# Patient Record
Sex: Female | Born: 1935
Health system: Southern US, Community
[De-identification: ages and names within clinical notes are randomized; demographics above are authoritative.]

## PROBLEM LIST (undated history)

## (undated) DIAGNOSIS — K5730 Diverticulosis of large intestine without perforation or abscess without bleeding: Secondary | ICD-10-CM

## (undated) DIAGNOSIS — K219 Gastro-esophageal reflux disease without esophagitis: Secondary | ICD-10-CM

## (undated) DIAGNOSIS — J42 Unspecified chronic bronchitis: Secondary | ICD-10-CM

## (undated) DIAGNOSIS — I4891 Unspecified atrial fibrillation: Secondary | ICD-10-CM

## (undated) DIAGNOSIS — F0390 Unspecified dementia without behavioral disturbance: Secondary | ICD-10-CM

## (undated) DIAGNOSIS — F411 Generalized anxiety disorder: Secondary | ICD-10-CM

## (undated) DIAGNOSIS — I10 Essential (primary) hypertension: Secondary | ICD-10-CM

## (undated) DIAGNOSIS — I712 Thoracic aortic aneurysm, without rupture: Secondary | ICD-10-CM

## (undated) DIAGNOSIS — M810 Age-related osteoporosis without current pathological fracture: Secondary | ICD-10-CM

## (undated) DIAGNOSIS — I2510 Atherosclerotic heart disease of native coronary artery without angina pectoris: Secondary | ICD-10-CM

## (undated) DIAGNOSIS — I442 Atrioventricular block, complete: Secondary | ICD-10-CM

## (undated) DIAGNOSIS — D649 Anemia, unspecified: Secondary | ICD-10-CM

## (undated) DIAGNOSIS — E785 Hyperlipidemia, unspecified: Secondary | ICD-10-CM

## (undated) DIAGNOSIS — Z952 Presence of prosthetic heart valve: Secondary | ICD-10-CM

## (undated) DIAGNOSIS — M1990 Unspecified osteoarthritis, unspecified site: Secondary | ICD-10-CM

## (undated) DIAGNOSIS — Z950 Presence of cardiac pacemaker: Secondary | ICD-10-CM

## (undated) DIAGNOSIS — I6529 Occlusion and stenosis of unspecified carotid artery: Secondary | ICD-10-CM

## (undated) HISTORY — PX: INTRAOCULAR LENS INSERTION: SHX110

## (undated) HISTORY — DX: Gastro-esophageal reflux disease without esophagitis: K21.9

## (undated) HISTORY — DX: Anemia, unspecified: D64.9

## (undated) HISTORY — PX: VESICOVAGINAL FISTULA CLOSURE W/ TAH: SUR271

## (undated) HISTORY — DX: Generalized anxiety disorder: F41.1

## (undated) HISTORY — DX: Diverticulosis of large intestine without perforation or abscess without bleeding: K57.30

## (undated) HISTORY — PX: CATARACT EXTRACTION: SUR2

## (undated) HISTORY — DX: Unspecified chronic bronchitis: J42

## (undated) HISTORY — DX: Unspecified atrial fibrillation: I48.91

## (undated) HISTORY — PX: CORONARY ARTERY BYPASS GRAFT: SHX141

## (undated) HISTORY — DX: Unspecified dementia without behavioral disturbance: F03.90

## (undated) HISTORY — DX: Age-related osteoporosis without current pathological fracture: M81.0

## (undated) HISTORY — DX: Unspecified osteoarthritis, unspecified site: M19.90

## (undated) HISTORY — DX: Presence of cardiac pacemaker: Z95.0

---

## 1996-03-23 HISTORY — PX: TOTAL HIP ARTHROPLASTY: SHX124

## 1998-09-04 ENCOUNTER — Emergency Department (HOSPITAL_COMMUNITY): Admission: EM | Admit: 1998-09-04 | Discharge: 1998-09-04 | Payer: Self-pay | Admitting: Emergency Medicine

## 1998-12-22 LAB — HM COLONOSCOPY

## 1998-12-31 ENCOUNTER — Ambulatory Visit (HOSPITAL_COMMUNITY): Admission: RE | Admit: 1998-12-31 | Discharge: 1998-12-31 | Payer: Self-pay | Admitting: Gastroenterology

## 1999-02-24 ENCOUNTER — Encounter: Payer: Self-pay | Admitting: Pulmonary Disease

## 1999-02-24 ENCOUNTER — Encounter: Admission: RE | Admit: 1999-02-24 | Discharge: 1999-02-24 | Payer: Self-pay | Admitting: Pulmonary Disease

## 2000-04-20 ENCOUNTER — Encounter: Payer: Self-pay | Admitting: Pulmonary Disease

## 2000-04-20 ENCOUNTER — Encounter: Admission: RE | Admit: 2000-04-20 | Discharge: 2000-04-20 | Payer: Self-pay | Admitting: Pulmonary Disease

## 2000-05-03 ENCOUNTER — Encounter: Payer: Self-pay | Admitting: Internal Medicine

## 2000-05-03 ENCOUNTER — Encounter: Admission: RE | Admit: 2000-05-03 | Discharge: 2000-05-03 | Payer: Self-pay | Admitting: Internal Medicine

## 2000-05-26 ENCOUNTER — Encounter: Admission: RE | Admit: 2000-05-26 | Discharge: 2000-07-22 | Payer: Self-pay | Admitting: Internal Medicine

## 2000-07-20 ENCOUNTER — Ambulatory Visit (HOSPITAL_COMMUNITY): Admission: RE | Admit: 2000-07-20 | Discharge: 2000-07-20 | Payer: Self-pay | Admitting: Pulmonary Disease

## 2000-07-20 ENCOUNTER — Encounter: Payer: Self-pay | Admitting: Pulmonary Disease

## 2001-03-01 ENCOUNTER — Ambulatory Visit (HOSPITAL_COMMUNITY): Admission: RE | Admit: 2001-03-01 | Discharge: 2001-03-01 | Payer: Self-pay | Admitting: Cardiology

## 2001-03-23 DIAGNOSIS — I712 Thoracic aortic aneurysm, without rupture, unspecified: Secondary | ICD-10-CM

## 2001-03-23 DIAGNOSIS — Z952 Presence of prosthetic heart valve: Secondary | ICD-10-CM

## 2001-03-23 HISTORY — PX: AORTIC VALVE REPLACEMENT: SHX41

## 2001-03-23 HISTORY — DX: Presence of prosthetic heart valve: Z95.2

## 2001-03-23 HISTORY — DX: Thoracic aortic aneurysm, without rupture: I71.2

## 2001-03-23 HISTORY — DX: Thoracic aortic aneurysm, without rupture, unspecified: I71.20

## 2001-09-12 ENCOUNTER — Ambulatory Visit (HOSPITAL_COMMUNITY): Admission: RE | Admit: 2001-09-12 | Discharge: 2001-09-12 | Payer: Self-pay | Admitting: Cardiothoracic Surgery

## 2001-09-12 ENCOUNTER — Encounter: Payer: Self-pay | Admitting: Cardiothoracic Surgery

## 2001-11-08 ENCOUNTER — Ambulatory Visit (HOSPITAL_COMMUNITY): Admission: RE | Admit: 2001-11-08 | Discharge: 2001-11-08 | Payer: Self-pay | Admitting: Cardiology

## 2001-12-02 ENCOUNTER — Encounter: Payer: Self-pay | Admitting: Cardiothoracic Surgery

## 2001-12-05 ENCOUNTER — Encounter (INDEPENDENT_AMBULATORY_CARE_PROVIDER_SITE_OTHER): Payer: Self-pay | Admitting: Specialist

## 2001-12-05 ENCOUNTER — Encounter: Payer: Self-pay | Admitting: Cardiothoracic Surgery

## 2001-12-05 ENCOUNTER — Inpatient Hospital Stay (HOSPITAL_COMMUNITY): Admission: RE | Admit: 2001-12-05 | Discharge: 2001-12-13 | Payer: Self-pay | Admitting: Cardiothoracic Surgery

## 2001-12-06 ENCOUNTER — Encounter: Payer: Self-pay | Admitting: Cardiothoracic Surgery

## 2001-12-07 ENCOUNTER — Encounter: Payer: Self-pay | Admitting: Cardiothoracic Surgery

## 2001-12-08 ENCOUNTER — Encounter: Payer: Self-pay | Admitting: Cardiothoracic Surgery

## 2001-12-09 ENCOUNTER — Encounter: Payer: Self-pay | Admitting: Cardiothoracic Surgery

## 2002-01-01 ENCOUNTER — Emergency Department (HOSPITAL_COMMUNITY): Admission: EM | Admit: 2002-01-01 | Discharge: 2002-01-01 | Payer: Self-pay | Admitting: Emergency Medicine

## 2002-01-19 ENCOUNTER — Encounter: Payer: Self-pay | Admitting: Cardiothoracic Surgery

## 2002-01-19 ENCOUNTER — Encounter: Admission: RE | Admit: 2002-01-19 | Discharge: 2002-01-19 | Payer: Self-pay | Admitting: Cardiothoracic Surgery

## 2002-01-30 ENCOUNTER — Encounter (HOSPITAL_COMMUNITY): Admission: RE | Admit: 2002-01-30 | Discharge: 2002-02-23 | Payer: Self-pay | Admitting: Cardiology

## 2002-05-24 ENCOUNTER — Ambulatory Visit (HOSPITAL_COMMUNITY): Admission: RE | Admit: 2002-05-24 | Discharge: 2002-05-24 | Payer: Self-pay | Admitting: Pulmonary Disease

## 2002-05-24 ENCOUNTER — Encounter: Payer: Self-pay | Admitting: Pulmonary Disease

## 2002-10-09 ENCOUNTER — Encounter: Payer: Self-pay | Admitting: Orthopedic Surgery

## 2002-10-16 ENCOUNTER — Inpatient Hospital Stay (HOSPITAL_COMMUNITY): Admission: RE | Admit: 2002-10-16 | Discharge: 2002-10-20 | Payer: Self-pay | Admitting: Orthopedic Surgery

## 2002-10-16 ENCOUNTER — Encounter: Payer: Self-pay | Admitting: Orthopedic Surgery

## 2002-11-25 ENCOUNTER — Encounter: Payer: Self-pay | Admitting: Emergency Medicine

## 2002-11-25 ENCOUNTER — Inpatient Hospital Stay (HOSPITAL_COMMUNITY): Admission: EM | Admit: 2002-11-25 | Discharge: 2002-12-02 | Payer: Self-pay | Admitting: Emergency Medicine

## 2002-11-28 ENCOUNTER — Encounter: Payer: Self-pay | Admitting: Cardiology

## 2003-01-24 ENCOUNTER — Inpatient Hospital Stay (HOSPITAL_COMMUNITY): Admission: EM | Admit: 2003-01-24 | Discharge: 2003-01-29 | Payer: Self-pay | Admitting: *Deleted

## 2003-01-27 ENCOUNTER — Encounter (INDEPENDENT_AMBULATORY_CARE_PROVIDER_SITE_OTHER): Payer: Self-pay | Admitting: *Deleted

## 2003-02-17 ENCOUNTER — Emergency Department (HOSPITAL_COMMUNITY): Admission: EM | Admit: 2003-02-17 | Discharge: 2003-02-17 | Payer: Self-pay | Admitting: Emergency Medicine

## 2004-03-04 ENCOUNTER — Ambulatory Visit: Payer: Self-pay | Admitting: Pulmonary Disease

## 2004-05-22 ENCOUNTER — Ambulatory Visit: Payer: Self-pay | Admitting: Pulmonary Disease

## 2004-06-02 ENCOUNTER — Ambulatory Visit: Payer: Self-pay | Admitting: Pulmonary Disease

## 2004-08-26 ENCOUNTER — Ambulatory Visit: Payer: Self-pay | Admitting: Cardiology

## 2004-09-02 ENCOUNTER — Ambulatory Visit: Payer: Self-pay | Admitting: Pulmonary Disease

## 2004-09-10 ENCOUNTER — Ambulatory Visit: Payer: Self-pay

## 2004-09-11 ENCOUNTER — Ambulatory Visit (HOSPITAL_COMMUNITY): Admission: RE | Admit: 2004-09-11 | Discharge: 2004-09-11 | Payer: Self-pay | Admitting: Cardiovascular Disease

## 2005-01-07 ENCOUNTER — Ambulatory Visit: Payer: Self-pay | Admitting: Pulmonary Disease

## 2005-04-27 ENCOUNTER — Ambulatory Visit: Payer: Self-pay | Admitting: Pulmonary Disease

## 2005-05-11 ENCOUNTER — Ambulatory Visit: Payer: Self-pay | Admitting: Pulmonary Disease

## 2005-09-01 ENCOUNTER — Ambulatory Visit: Payer: Self-pay | Admitting: Cardiology

## 2005-09-07 ENCOUNTER — Ambulatory Visit (HOSPITAL_COMMUNITY): Admission: RE | Admit: 2005-09-07 | Discharge: 2005-09-07 | Payer: Self-pay | Admitting: Cardiology

## 2005-09-14 ENCOUNTER — Ambulatory Visit: Payer: Self-pay

## 2005-09-14 ENCOUNTER — Encounter: Payer: Self-pay | Admitting: Cardiology

## 2005-09-16 ENCOUNTER — Ambulatory Visit: Payer: Self-pay | Admitting: Pulmonary Disease

## 2006-01-14 ENCOUNTER — Ambulatory Visit: Payer: Self-pay | Admitting: Pulmonary Disease

## 2006-03-22 ENCOUNTER — Inpatient Hospital Stay (HOSPITAL_COMMUNITY): Admission: EM | Admit: 2006-03-22 | Discharge: 2006-04-02 | Payer: Self-pay | Admitting: Emergency Medicine

## 2006-03-22 ENCOUNTER — Ambulatory Visit: Payer: Self-pay | Admitting: Pulmonary Disease

## 2006-03-22 ENCOUNTER — Ambulatory Visit: Payer: Self-pay | Admitting: Cardiovascular Disease

## 2006-03-24 ENCOUNTER — Encounter: Payer: Self-pay | Admitting: Cardiology

## 2006-03-29 ENCOUNTER — Encounter (INDEPENDENT_AMBULATORY_CARE_PROVIDER_SITE_OTHER): Payer: Self-pay | Admitting: Specialist

## 2006-04-05 ENCOUNTER — Ambulatory Visit: Payer: Self-pay | Admitting: Cardiology

## 2006-04-06 ENCOUNTER — Ambulatory Visit: Payer: Self-pay | Admitting: Pulmonary Disease

## 2006-04-06 LAB — CONVERTED CEMR LAB
ALT: 20 units/L (ref 0–40)
AST: 18 units/L (ref 0–37)
Albumin: 2.9 g/dL — ABNORMAL LOW (ref 3.5–5.2)
BUN: 18 mg/dL (ref 6–23)
Basophils Absolute: 0.1 10*3/uL (ref 0.0–0.1)
CO2: 28 meq/L (ref 19–32)
Creatinine, Ser: 1.5 mg/dL — ABNORMAL HIGH (ref 0.4–1.2)
GFR calc Af Amer: 43 mL/min
INR: 2.3 — ABNORMAL HIGH (ref 0.9–2.0)
MCHC: 32.7 g/dL (ref 30.0–36.0)
MCV: 90.2 fL (ref 78.0–100.0)
Neutro Abs: 5.3 10*3/uL (ref 1.4–7.7)
Neutrophils Relative %: 68.7 % (ref 43.0–77.0)
Prothrombin Time: 19.2 s — ABNORMAL HIGH (ref 10.0–14.0)
RBC: 3.95 M/uL (ref 3.87–5.11)
Sed Rate: 117 mm/hr — ABNORMAL HIGH (ref 0–25)
Total Bilirubin: 1 mg/dL (ref 0.3–1.2)
WBC: 7.8 10*3/uL (ref 4.5–10.5)

## 2006-04-12 ENCOUNTER — Ambulatory Visit: Payer: Self-pay | Admitting: Cardiology

## 2006-04-14 ENCOUNTER — Ambulatory Visit (HOSPITAL_COMMUNITY): Admission: RE | Admit: 2006-04-14 | Discharge: 2006-04-14 | Payer: Self-pay | Admitting: Pulmonary Disease

## 2006-04-20 ENCOUNTER — Ambulatory Visit: Payer: Self-pay | Admitting: Cardiology

## 2006-04-21 ENCOUNTER — Ambulatory Visit: Payer: Self-pay | Admitting: Cardiology

## 2006-04-30 ENCOUNTER — Ambulatory Visit: Payer: Self-pay | Admitting: *Deleted

## 2006-05-04 ENCOUNTER — Ambulatory Visit: Payer: Self-pay | Admitting: Pulmonary Disease

## 2006-05-04 LAB — CONVERTED CEMR LAB
CO2: 29 meq/L (ref 19–32)
Creatinine, Ser: 1.3 mg/dL — ABNORMAL HIGH (ref 0.4–1.2)
Eosinophils Absolute: 0.2 10*3/uL (ref 0.0–0.6)
Eosinophils Relative: 3.7 % (ref 0.0–5.0)
GFR calc Af Amer: 51 mL/min
GFR calc non Af Amer: 42 mL/min
HCT: 36.1 % (ref 36.0–46.0)
Hemoglobin: 12.4 g/dL (ref 12.0–15.0)
INR: 2.2 — ABNORMAL HIGH (ref 0.9–2.0)
MCHC: 34.4 g/dL (ref 30.0–36.0)
Monocytes Absolute: 0.5 10*3/uL (ref 0.2–0.7)
Neutro Abs: 3.3 10*3/uL (ref 1.4–7.7)
Prothrombin Time: 18.9 s — ABNORMAL HIGH (ref 10.0–14.0)
Sodium: 140 meq/L (ref 135–145)

## 2006-05-12 ENCOUNTER — Ambulatory Visit: Payer: Self-pay | Admitting: Cardiovascular Disease

## 2006-06-02 ENCOUNTER — Ambulatory Visit: Payer: Self-pay | Admitting: Cardiology

## 2006-06-07 ENCOUNTER — Ambulatory Visit: Payer: Self-pay | Admitting: Pulmonary Disease

## 2006-06-07 LAB — CONVERTED CEMR LAB
BUN: 27 mg/dL — ABNORMAL HIGH (ref 6–23)
Basophils Absolute: 0 10*3/uL (ref 0.0–0.1)
Basophils Relative: 0.4 % (ref 0.0–1.0)
Bilirubin, Direct: 0.1 mg/dL (ref 0.0–0.3)
Calcium: 9.1 mg/dL (ref 8.4–10.5)
Chloride: 100 meq/L (ref 96–112)
Creatinine, Ser: 1.1 mg/dL (ref 0.4–1.2)
Eosinophils Absolute: 0.1 10*3/uL (ref 0.0–0.6)
GFR calc Af Amer: 61 mL/min
HCT: 40.1 % (ref 36.0–46.0)
Hemoglobin: 13.6 g/dL (ref 12.0–15.0)
Lymphocytes Relative: 13.6 % (ref 12.0–46.0)
MCHC: 34 g/dL (ref 30.0–36.0)
Platelets: 267 10*3/uL (ref 150–400)
RBC: 4.73 M/uL (ref 3.87–5.11)
Sed Rate: 46 mm/hr — ABNORMAL HIGH (ref 0–25)
Total Bilirubin: 0.7 mg/dL (ref 0.3–1.2)
Total Protein: 6.9 g/dL (ref 6.0–8.3)
WBC: 7.9 10*3/uL (ref 4.5–10.5)

## 2006-06-16 ENCOUNTER — Ambulatory Visit: Payer: Self-pay | Admitting: Cardiovascular Disease

## 2006-07-07 ENCOUNTER — Ambulatory Visit: Payer: Self-pay | Admitting: Cardiology

## 2006-07-10 ENCOUNTER — Emergency Department (HOSPITAL_COMMUNITY): Admission: EM | Admit: 2006-07-10 | Discharge: 2006-07-10 | Payer: Self-pay | Admitting: Emergency Medicine

## 2006-07-14 ENCOUNTER — Ambulatory Visit: Payer: Self-pay | Admitting: *Deleted

## 2006-07-15 ENCOUNTER — Ambulatory Visit: Payer: Self-pay | Admitting: Pulmonary Disease

## 2006-07-28 ENCOUNTER — Ambulatory Visit: Payer: Self-pay | Admitting: *Deleted

## 2006-08-18 ENCOUNTER — Ambulatory Visit: Payer: Self-pay | Admitting: Cardiology

## 2006-08-23 ENCOUNTER — Ambulatory Visit: Payer: Self-pay | Admitting: Pulmonary Disease

## 2006-08-23 LAB — CONVERTED CEMR LAB
AST: 21 units/L (ref 0–37)
Alkaline Phosphatase: 101 units/L (ref 39–117)
Basophils Absolute: 0 10*3/uL (ref 0.0–0.1)
Basophils Relative: 0.1 % (ref 0.0–1.0)
Bilirubin, Direct: 0.1 mg/dL (ref 0.0–0.3)
CO2: 30 meq/L (ref 19–32)
Calcium: 8.9 mg/dL (ref 8.4–10.5)
Chloride: 105 meq/L (ref 96–112)
Creatinine, Ser: 1.2 mg/dL (ref 0.4–1.2)
Eosinophils Relative: 0.3 % (ref 0.0–5.0)
GFR calc non Af Amer: 46 mL/min
HCT: 39.1 % (ref 36.0–46.0)
Hemoglobin: 13.5 g/dL (ref 12.0–15.0)
Monocytes Absolute: 0.7 10*3/uL (ref 0.2–0.7)
Monocytes Relative: 8.2 % (ref 3.0–11.0)
Neutro Abs: 7 10*3/uL (ref 1.4–7.7)
Potassium: 4 meq/L (ref 3.5–5.1)
RBC: 4.62 M/uL (ref 3.87–5.11)
Total Protein: 6.7 g/dL (ref 6.0–8.3)
WBC: 8.8 10*3/uL (ref 4.5–10.5)

## 2006-08-26 ENCOUNTER — Ambulatory Visit: Payer: Self-pay | Admitting: Cardiovascular Disease

## 2006-08-30 ENCOUNTER — Ambulatory Visit: Payer: Self-pay | Admitting: Cardiology

## 2006-08-30 ENCOUNTER — Observation Stay (HOSPITAL_COMMUNITY): Admission: EM | Admit: 2006-08-30 | Discharge: 2006-08-31 | Payer: Self-pay | Admitting: Emergency Medicine

## 2006-09-02 ENCOUNTER — Ambulatory Visit: Payer: Self-pay | Admitting: Cardiology

## 2006-09-02 ENCOUNTER — Ambulatory Visit: Payer: Self-pay

## 2006-09-02 LAB — CONVERTED CEMR LAB
CO2: 26 meq/L (ref 19–32)
Creatinine, Ser: 1.2 mg/dL (ref 0.4–1.2)
Glucose, Bld: 101 mg/dL — ABNORMAL HIGH (ref 70–99)
Potassium: 4.4 meq/L (ref 3.5–5.1)

## 2006-09-06 ENCOUNTER — Emergency Department (HOSPITAL_COMMUNITY): Admission: EM | Admit: 2006-09-06 | Discharge: 2006-09-06 | Payer: Self-pay | Admitting: Emergency Medicine

## 2006-09-07 ENCOUNTER — Observation Stay (HOSPITAL_COMMUNITY): Admission: EM | Admit: 2006-09-07 | Discharge: 2006-09-08 | Payer: Self-pay | Admitting: Emergency Medicine

## 2006-09-07 ENCOUNTER — Ambulatory Visit: Payer: Self-pay | Admitting: Cardiovascular Disease

## 2006-09-13 ENCOUNTER — Ambulatory Visit: Payer: Self-pay | Admitting: Pulmonary Disease

## 2006-09-14 ENCOUNTER — Emergency Department (HOSPITAL_COMMUNITY): Admission: EM | Admit: 2006-09-14 | Discharge: 2006-09-14 | Payer: Self-pay | Admitting: Emergency Medicine

## 2006-09-15 ENCOUNTER — Ambulatory Visit: Payer: Self-pay | Admitting: Internal Medicine

## 2006-09-20 ENCOUNTER — Ambulatory Visit: Payer: Self-pay | Admitting: Pulmonary Disease

## 2006-09-20 LAB — CONVERTED CEMR LAB
BUN: 12 mg/dL (ref 6–23)
Calcium: 9.3 mg/dL (ref 8.4–10.5)
Chloride: 103 meq/L (ref 96–112)
Glucose, Bld: 97 mg/dL (ref 70–99)

## 2006-09-22 ENCOUNTER — Ambulatory Visit: Payer: Self-pay | Admitting: Cardiology

## 2006-09-29 ENCOUNTER — Ambulatory Visit: Payer: Self-pay | Admitting: Cardiology

## 2006-10-06 ENCOUNTER — Ambulatory Visit: Payer: Self-pay | Admitting: Internal Medicine

## 2006-10-12 ENCOUNTER — Ambulatory Visit: Payer: Self-pay | Admitting: Pulmonary Disease

## 2006-10-27 ENCOUNTER — Ambulatory Visit: Payer: Self-pay | Admitting: Cardiology

## 2006-11-03 ENCOUNTER — Ambulatory Visit: Payer: Self-pay | Admitting: Cardiology

## 2006-11-24 ENCOUNTER — Ambulatory Visit: Payer: Self-pay | Admitting: Internal Medicine

## 2006-12-21 ENCOUNTER — Ambulatory Visit: Payer: Self-pay | Admitting: Cardiology

## 2007-01-18 DIAGNOSIS — I10 Essential (primary) hypertension: Secondary | ICD-10-CM | POA: Insufficient documentation

## 2007-01-18 DIAGNOSIS — F411 Generalized anxiety disorder: Secondary | ICD-10-CM

## 2007-01-19 ENCOUNTER — Ambulatory Visit: Payer: Self-pay | Admitting: Cardiology

## 2007-01-19 LAB — CONVERTED CEMR LAB
CO2: 30 meq/L (ref 19–32)
Calcium: 9.3 mg/dL (ref 8.4–10.5)
Chloride: 99 meq/L (ref 96–112)
Creatinine, Ser: 1.3 mg/dL — ABNORMAL HIGH (ref 0.4–1.2)
GFR calc Af Amer: 51 mL/min
Sodium: 136 meq/L (ref 135–145)

## 2007-01-20 ENCOUNTER — Ambulatory Visit: Payer: Self-pay

## 2007-01-24 ENCOUNTER — Ambulatory Visit: Payer: Self-pay | Admitting: Cardiology

## 2007-01-24 LAB — CONVERTED CEMR LAB
BUN: 22 mg/dL (ref 6–23)
Basophils Absolute: 0 10*3/uL (ref 0.0–0.1)
CO2: 30 meq/L (ref 19–32)
Calcium: 9.2 mg/dL (ref 8.4–10.5)
Chloride: 106 meq/L (ref 96–112)
Eosinophils Absolute: 0.1 10*3/uL (ref 0.0–0.6)
Eosinophils Relative: 1.7 % (ref 0.0–5.0)
GFR calc Af Amer: 55 mL/min
HCT: 40.9 % (ref 36.0–46.0)
INR: 2.2 — ABNORMAL HIGH (ref 0.8–1.0)
Lymphocytes Relative: 20.9 % (ref 12.0–46.0)
MCV: 82.6 fL (ref 78.0–100.0)
Monocytes Absolute: 0.4 10*3/uL (ref 0.2–0.7)
Neutro Abs: 3.2 10*3/uL (ref 1.4–7.7)
Platelets: 218 10*3/uL (ref 150–400)
RBC: 4.96 M/uL (ref 3.87–5.11)
Sodium: 142 meq/L (ref 135–145)
aPTT: 36.9 s — ABNORMAL HIGH (ref 21.7–29.8)

## 2007-02-14 ENCOUNTER — Ambulatory Visit: Payer: Self-pay | Admitting: Cardiology

## 2007-02-16 ENCOUNTER — Ambulatory Visit: Payer: Self-pay | Admitting: Cardiology

## 2007-02-25 ENCOUNTER — Observation Stay (HOSPITAL_COMMUNITY): Admission: EM | Admit: 2007-02-25 | Discharge: 2007-02-25 | Payer: Self-pay | Admitting: Emergency Medicine

## 2007-02-25 ENCOUNTER — Ambulatory Visit: Payer: Self-pay | Admitting: Internal Medicine

## 2007-03-11 ENCOUNTER — Telehealth: Payer: Self-pay | Admitting: Pulmonary Disease

## 2007-03-18 ENCOUNTER — Ambulatory Visit: Payer: Self-pay | Admitting: Pulmonary Disease

## 2007-03-18 ENCOUNTER — Inpatient Hospital Stay (HOSPITAL_COMMUNITY): Admission: EM | Admit: 2007-03-18 | Discharge: 2007-03-30 | Payer: Self-pay | Admitting: Emergency Medicine

## 2007-03-31 ENCOUNTER — Telehealth: Payer: Self-pay | Admitting: Pulmonary Disease

## 2007-04-13 ENCOUNTER — Ambulatory Visit: Payer: Self-pay | Admitting: Pulmonary Disease

## 2007-04-13 DIAGNOSIS — D649 Anemia, unspecified: Secondary | ICD-10-CM | POA: Insufficient documentation

## 2007-04-13 DIAGNOSIS — K5730 Diverticulosis of large intestine without perforation or abscess without bleeding: Secondary | ICD-10-CM | POA: Insufficient documentation

## 2007-04-13 DIAGNOSIS — F0390 Unspecified dementia without behavioral disturbance: Secondary | ICD-10-CM | POA: Insufficient documentation

## 2007-04-13 DIAGNOSIS — I2510 Atherosclerotic heart disease of native coronary artery without angina pectoris: Secondary | ICD-10-CM

## 2007-04-13 DIAGNOSIS — E785 Hyperlipidemia, unspecified: Secondary | ICD-10-CM

## 2007-04-13 DIAGNOSIS — I38 Endocarditis, valve unspecified: Secondary | ICD-10-CM | POA: Insufficient documentation

## 2007-04-13 DIAGNOSIS — I480 Paroxysmal atrial fibrillation: Secondary | ICD-10-CM

## 2007-04-14 ENCOUNTER — Ambulatory Visit: Payer: Self-pay | Admitting: Pulmonary Disease

## 2007-04-22 ENCOUNTER — Telehealth (INDEPENDENT_AMBULATORY_CARE_PROVIDER_SITE_OTHER): Payer: Self-pay | Admitting: *Deleted

## 2007-06-01 ENCOUNTER — Ambulatory Visit: Payer: Self-pay | Admitting: Pulmonary Disease

## 2007-06-01 DIAGNOSIS — N259 Disorder resulting from impaired renal tubular function, unspecified: Secondary | ICD-10-CM | POA: Insufficient documentation

## 2007-06-01 DIAGNOSIS — M1990 Unspecified osteoarthritis, unspecified site: Secondary | ICD-10-CM | POA: Insufficient documentation

## 2007-06-06 ENCOUNTER — Telehealth: Payer: Self-pay | Admitting: Pulmonary Disease

## 2007-06-06 LAB — CONVERTED CEMR LAB
Basophils Absolute: 0 10*3/uL (ref 0.0–0.1)
Basophils Relative: 0.8 % (ref 0.0–1.0)
CO2: 33 meq/L — ABNORMAL HIGH (ref 19–32)
Chloride: 101 meq/L (ref 96–112)
Eosinophils Absolute: 0.2 10*3/uL (ref 0.0–0.6)
HCT: 35.1 % — ABNORMAL LOW (ref 36.0–46.0)
Lymphocytes Relative: 18 % (ref 12.0–46.0)
MCHC: 33.2 g/dL (ref 30.0–36.0)
MCV: 87.4 fL (ref 78.0–100.0)
Monocytes Absolute: 0.8 10*3/uL — ABNORMAL HIGH (ref 0.2–0.7)
Neutrophils Relative %: 65.3 % (ref 43.0–77.0)
Potassium: 4.1 meq/L (ref 3.5–5.1)
RBC: 4.02 M/uL (ref 3.87–5.11)
Sodium: 140 meq/L (ref 135–145)

## 2007-06-09 ENCOUNTER — Ambulatory Visit: Payer: Self-pay | Admitting: Cardiology

## 2007-06-17 ENCOUNTER — Encounter: Payer: Self-pay | Admitting: Pulmonary Disease

## 2007-06-20 ENCOUNTER — Ambulatory Visit: Payer: Self-pay

## 2007-06-20 ENCOUNTER — Encounter: Payer: Self-pay | Admitting: Cardiology

## 2007-06-20 ENCOUNTER — Encounter: Payer: Self-pay | Admitting: Pulmonary Disease

## 2007-06-22 ENCOUNTER — Telehealth: Payer: Self-pay | Admitting: Pulmonary Disease

## 2007-06-23 ENCOUNTER — Encounter: Payer: Self-pay | Admitting: Pulmonary Disease

## 2007-06-28 ENCOUNTER — Encounter: Payer: Self-pay | Admitting: Pulmonary Disease

## 2007-06-29 ENCOUNTER — Telehealth: Payer: Self-pay | Admitting: Pulmonary Disease

## 2007-07-05 ENCOUNTER — Ambulatory Visit: Payer: Self-pay | Admitting: Pulmonary Disease

## 2007-07-18 ENCOUNTER — Telehealth: Payer: Self-pay | Admitting: Pulmonary Disease

## 2007-07-19 ENCOUNTER — Encounter: Payer: Self-pay | Admitting: Pulmonary Disease

## 2007-07-20 ENCOUNTER — Telehealth (INDEPENDENT_AMBULATORY_CARE_PROVIDER_SITE_OTHER): Payer: Self-pay | Admitting: *Deleted

## 2007-08-11 ENCOUNTER — Ambulatory Visit: Payer: Self-pay | Admitting: Pulmonary Disease

## 2007-08-11 ENCOUNTER — Telehealth (INDEPENDENT_AMBULATORY_CARE_PROVIDER_SITE_OTHER): Payer: Self-pay | Admitting: *Deleted

## 2007-08-11 LAB — CONVERTED CEMR LAB
Bilirubin Urine: NEGATIVE
Ketones, ur: NEGATIVE mg/dL
Leukocytes, UA: NEGATIVE
Specific Gravity, Urine: 1.02 (ref 1.000–1.03)
Urobilinogen, UA: 0.2 (ref 0.0–1.0)

## 2007-09-05 ENCOUNTER — Ambulatory Visit: Payer: Self-pay | Admitting: Pulmonary Disease

## 2007-09-10 LAB — CONVERTED CEMR LAB
ALT: 10 units/L (ref 0–35)
AST: 13 units/L (ref 0–37)
Alkaline Phosphatase: 80 units/L (ref 39–117)
Basophils Absolute: 0 10*3/uL (ref 0.0–0.1)
Basophils Relative: 0 % (ref 0.0–1.0)
Bilirubin, Direct: 0.1 mg/dL (ref 0.0–0.3)
Chloride: 99 meq/L (ref 96–112)
Creatinine, Ser: 1.2 mg/dL (ref 0.4–1.2)
Eosinophils Absolute: 0.1 10*3/uL (ref 0.0–0.7)
Eosinophils Relative: 2.1 % (ref 0.0–5.0)
GFR calc non Af Amer: 46 mL/min
Glucose, Bld: 89 mg/dL (ref 70–99)
Hemoglobin: 11.9 g/dL — ABNORMAL LOW (ref 12.0–15.0)
Hgb A1c MFr Bld: 5.8 % (ref 4.6–6.0)
MCV: 86.5 fL (ref 78.0–100.0)
Neutro Abs: 3.9 10*3/uL (ref 1.4–7.7)
Platelets: 269 10*3/uL (ref 150–400)
Potassium: 3.7 meq/L (ref 3.5–5.1)
RBC: 4.02 M/uL (ref 3.87–5.11)
RDW: 15.7 % — ABNORMAL HIGH (ref 11.5–14.6)
TSH: 1.71 microintl units/mL (ref 0.35–5.50)

## 2007-11-08 ENCOUNTER — Encounter: Payer: Self-pay | Admitting: Pulmonary Disease

## 2007-11-23 ENCOUNTER — Ambulatory Visit: Payer: Self-pay | Admitting: Cardiology

## 2007-12-06 ENCOUNTER — Ambulatory Visit: Payer: Self-pay | Admitting: Pulmonary Disease

## 2007-12-11 LAB — CONVERTED CEMR LAB
Basophils Absolute: 0.1 10*3/uL (ref 0.0–0.1)
Basophils Relative: 0.9 % (ref 0.0–3.0)
CO2: 29 meq/L (ref 19–32)
Chloride: 108 meq/L (ref 96–112)
Eosinophils Absolute: 0.2 10*3/uL (ref 0.0–0.7)
Eosinophils Relative: 2.7 % (ref 0.0–5.0)
GFR calc Af Amer: 61 mL/min
Hgb A1c MFr Bld: 5.5 % (ref 4.6–6.0)
MCHC: 34.1 g/dL (ref 30.0–36.0)
Monocytes Absolute: 0.6 10*3/uL (ref 0.1–1.0)
Monocytes Relative: 9.8 % (ref 3.0–12.0)
Neutro Abs: 3.5 10*3/uL (ref 1.4–7.7)
Neutrophils Relative %: 64.4 % (ref 43.0–77.0)
Platelets: 221 10*3/uL (ref 150–400)
Potassium: 4.5 meq/L (ref 3.5–5.1)
Pro B Natriuretic peptide (BNP): 353 pg/mL — ABNORMAL HIGH (ref 0.0–100.0)
RDW: 15.2 % — ABNORMAL HIGH (ref 11.5–14.6)

## 2008-02-14 ENCOUNTER — Encounter: Payer: Self-pay | Admitting: Pulmonary Disease

## 2008-04-05 ENCOUNTER — Ambulatory Visit: Payer: Self-pay | Admitting: Pulmonary Disease

## 2008-04-07 LAB — CONVERTED CEMR LAB
ALT: 11 units/L (ref 0–35)
Alkaline Phosphatase: 98 units/L (ref 39–117)
Calcium: 9.2 mg/dL (ref 8.4–10.5)
Chloride: 104 meq/L (ref 96–112)
Cholesterol: 247 mg/dL (ref 0–200)
Direct LDL: 165.6 mg/dL
Eosinophils Absolute: 0.1 10*3/uL (ref 0.0–0.7)
GFR calc Af Amer: 43 mL/min
Hemoglobin: 12.8 g/dL (ref 12.0–15.0)
Monocytes Absolute: 0.5 10*3/uL (ref 0.1–1.0)
Monocytes Relative: 9 % (ref 3.0–12.0)
Neutro Abs: 3.9 10*3/uL (ref 1.4–7.7)
RBC: 4.36 M/uL (ref 3.87–5.11)
Sodium: 140 meq/L (ref 135–145)
Total Bilirubin: 0.8 mg/dL (ref 0.3–1.2)
Total CHOL/HDL Ratio: 6.2
Total Protein: 6.8 g/dL (ref 6.0–8.3)
Triglycerides: 200 mg/dL — ABNORMAL HIGH (ref 0–149)
Vit D, 1,25-Dihydroxy: 7 — ABNORMAL LOW (ref 30–89)

## 2008-04-09 DIAGNOSIS — K219 Gastro-esophageal reflux disease without esophagitis: Secondary | ICD-10-CM

## 2008-04-10 ENCOUNTER — Telehealth (INDEPENDENT_AMBULATORY_CARE_PROVIDER_SITE_OTHER): Payer: Self-pay | Admitting: *Deleted

## 2008-05-08 DIAGNOSIS — Z954 Presence of other heart-valve replacement: Secondary | ICD-10-CM

## 2008-05-08 DIAGNOSIS — M810 Age-related osteoporosis without current pathological fracture: Secondary | ICD-10-CM

## 2008-06-22 ENCOUNTER — Inpatient Hospital Stay (HOSPITAL_COMMUNITY): Admission: EM | Admit: 2008-06-22 | Discharge: 2008-06-29 | Payer: Self-pay | Admitting: *Deleted

## 2008-06-22 ENCOUNTER — Encounter: Payer: Self-pay | Admitting: Pulmonary Disease

## 2008-06-22 ENCOUNTER — Ambulatory Visit: Payer: Self-pay | Admitting: Surgery

## 2008-06-22 ENCOUNTER — Ambulatory Visit: Payer: Self-pay | Admitting: Pulmonary Disease

## 2008-06-28 ENCOUNTER — Ambulatory Visit: Payer: Self-pay | Admitting: Physical Medicine & Rehabilitation

## 2008-06-29 ENCOUNTER — Inpatient Hospital Stay (HOSPITAL_COMMUNITY)
Admission: RE | Admit: 2008-06-29 | Discharge: 2008-07-09 | Payer: Self-pay | Admitting: Physical Medicine & Rehabilitation

## 2008-06-29 ENCOUNTER — Ambulatory Visit: Payer: Self-pay | Admitting: Physical Medicine & Rehabilitation

## 2008-07-09 ENCOUNTER — Encounter: Payer: Self-pay | Admitting: Pulmonary Disease

## 2008-08-01 ENCOUNTER — Encounter: Payer: Self-pay | Admitting: Pulmonary Disease

## 2008-09-03 ENCOUNTER — Telehealth (INDEPENDENT_AMBULATORY_CARE_PROVIDER_SITE_OTHER): Payer: Self-pay | Admitting: *Deleted

## 2008-09-07 ENCOUNTER — Encounter: Payer: Self-pay | Admitting: Pulmonary Disease

## 2008-09-19 ENCOUNTER — Ambulatory Visit: Payer: Self-pay | Admitting: Pulmonary Disease

## 2008-09-19 DIAGNOSIS — M545 Low back pain: Secondary | ICD-10-CM | POA: Insufficient documentation

## 2008-09-20 ENCOUNTER — Ambulatory Visit: Payer: Self-pay | Admitting: Pulmonary Disease

## 2008-09-29 LAB — CONVERTED CEMR LAB
ALT: 15 units/L (ref 0–35)
AST: 21 units/L (ref 0–37)
Alkaline Phosphatase: 111 units/L (ref 39–117)
BUN: 21 mg/dL (ref 6–23)
Basophils Relative: 0.3 % (ref 0.0–3.0)
Bilirubin, Direct: 0.1 mg/dL (ref 0.0–0.3)
CO2: 30 meq/L (ref 19–32)
Chloride: 100 meq/L (ref 96–112)
Cholesterol: 157 mg/dL (ref 0–200)
Creatinine, Ser: 1.4 mg/dL — ABNORMAL HIGH (ref 0.4–1.2)
Direct LDL: 84.5 mg/dL
Eosinophils Relative: 3.3 % (ref 0.0–5.0)
Glucose, Bld: 75 mg/dL (ref 70–99)
HCT: 38.7 % (ref 36.0–46.0)
HDL: 41.9 mg/dL (ref >39.00)
Hemoglobin: 13 g/dL (ref 12.0–15.0)
MCV: 87.8 fL (ref 78.0–100.0)
Monocytes Relative: 7.1 % (ref 3.0–12.0)
Neutrophils Relative %: 71 % (ref 43.0–77.0)
Platelets: 240 10*3/uL (ref 150.0–400.0)
Potassium: 3.9 meq/L (ref 3.5–5.1)
RBC: 4.41 M/uL (ref 3.87–5.11)
Sed Rate: 49 mm/hr — ABNORMAL HIGH (ref 0–22)
Sodium: 140 meq/L (ref 135–145)
Total Bilirubin: 0.7 mg/dL (ref 0.3–1.2)
Total CHOL/HDL Ratio: 4
WBC: 6 10*3/uL (ref 4.5–10.5)

## 2008-10-02 ENCOUNTER — Telehealth (INDEPENDENT_AMBULATORY_CARE_PROVIDER_SITE_OTHER): Payer: Self-pay | Admitting: *Deleted

## 2008-10-12 ENCOUNTER — Telehealth: Payer: Self-pay | Admitting: Pulmonary Disease

## 2008-10-12 DIAGNOSIS — H9190 Unspecified hearing loss, unspecified ear: Secondary | ICD-10-CM | POA: Insufficient documentation

## 2008-10-24 ENCOUNTER — Encounter: Payer: Self-pay | Admitting: Pulmonary Disease

## 2008-11-29 ENCOUNTER — Ambulatory Visit: Payer: Self-pay | Admitting: Cardiology

## 2008-11-29 DIAGNOSIS — I6529 Occlusion and stenosis of unspecified carotid artery: Secondary | ICD-10-CM

## 2008-11-29 IMAGING — CR DG CHEST 1V
1 series · 1 of 1 positions shown · non-contrast
Comparison: 03/28/06.

CLINICAL DATA: Status-post left thoracentesis, pneumonia.  Question pneumothorax. 
 CHEST ? 1 VIEW:

[view not recorded]
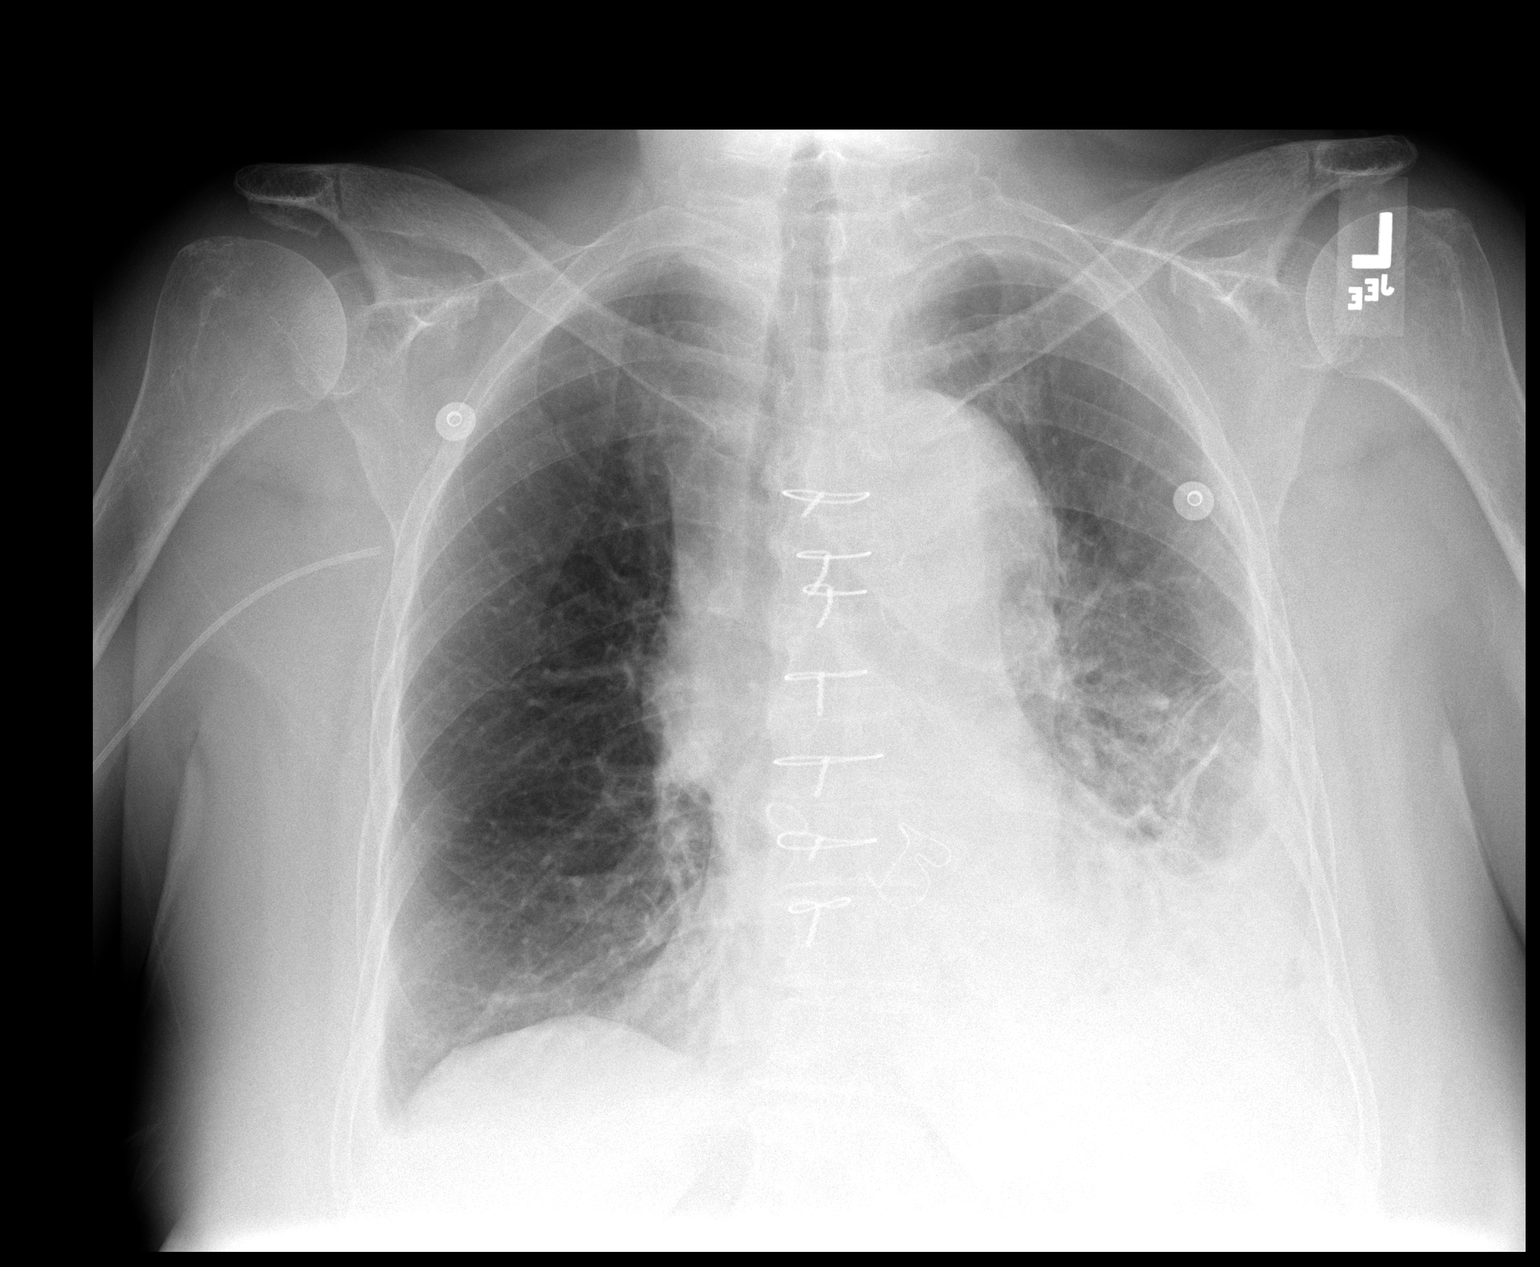

[1 of 1 positions shown; findings below may reference images not displayed]

FINDINGS: There has been a left thoracentesis.  Small left pleural effusion remains. There are left basilar atelectatic/infiltrative changes and there is a small right pleural effusion.  There is no evidence for pneumothorax. There is mild cardiomegaly.  There has been a previous median sternotomy and a previous aortic valve replacement.  The aorta is diffusely ectatic but unchanged in appearance.
IMPRESSION: No pneumothorax following left thoracentesis.  Small bilateral pleural effusions.  Left basilar atelectasis/infiltrate.

## 2008-12-05 ENCOUNTER — Telehealth (INDEPENDENT_AMBULATORY_CARE_PROVIDER_SITE_OTHER): Payer: Self-pay | Admitting: *Deleted

## 2008-12-12 ENCOUNTER — Encounter: Payer: Self-pay | Admitting: Pulmonary Disease

## 2008-12-17 ENCOUNTER — Ambulatory Visit: Payer: Self-pay

## 2008-12-17 ENCOUNTER — Encounter: Payer: Self-pay | Admitting: Cardiology

## 2009-01-02 ENCOUNTER — Ambulatory Visit: Payer: Self-pay | Admitting: Pulmonary Disease

## 2009-01-23 ENCOUNTER — Ambulatory Visit: Payer: Self-pay | Admitting: Pulmonary Disease

## 2009-01-29 ENCOUNTER — Telehealth: Payer: Self-pay | Admitting: Pulmonary Disease

## 2009-03-12 IMAGING — CR DG CHEST 2V
2 series · 2 of 2 positions shown · non-contrast
Comparison: 04/14/2006. 

Exam: Chest, 2 views.

HISTORY: Facial numbness and dizziness.

Nonsmoker. Shortness of breath and chest pain.

[w chest pa]
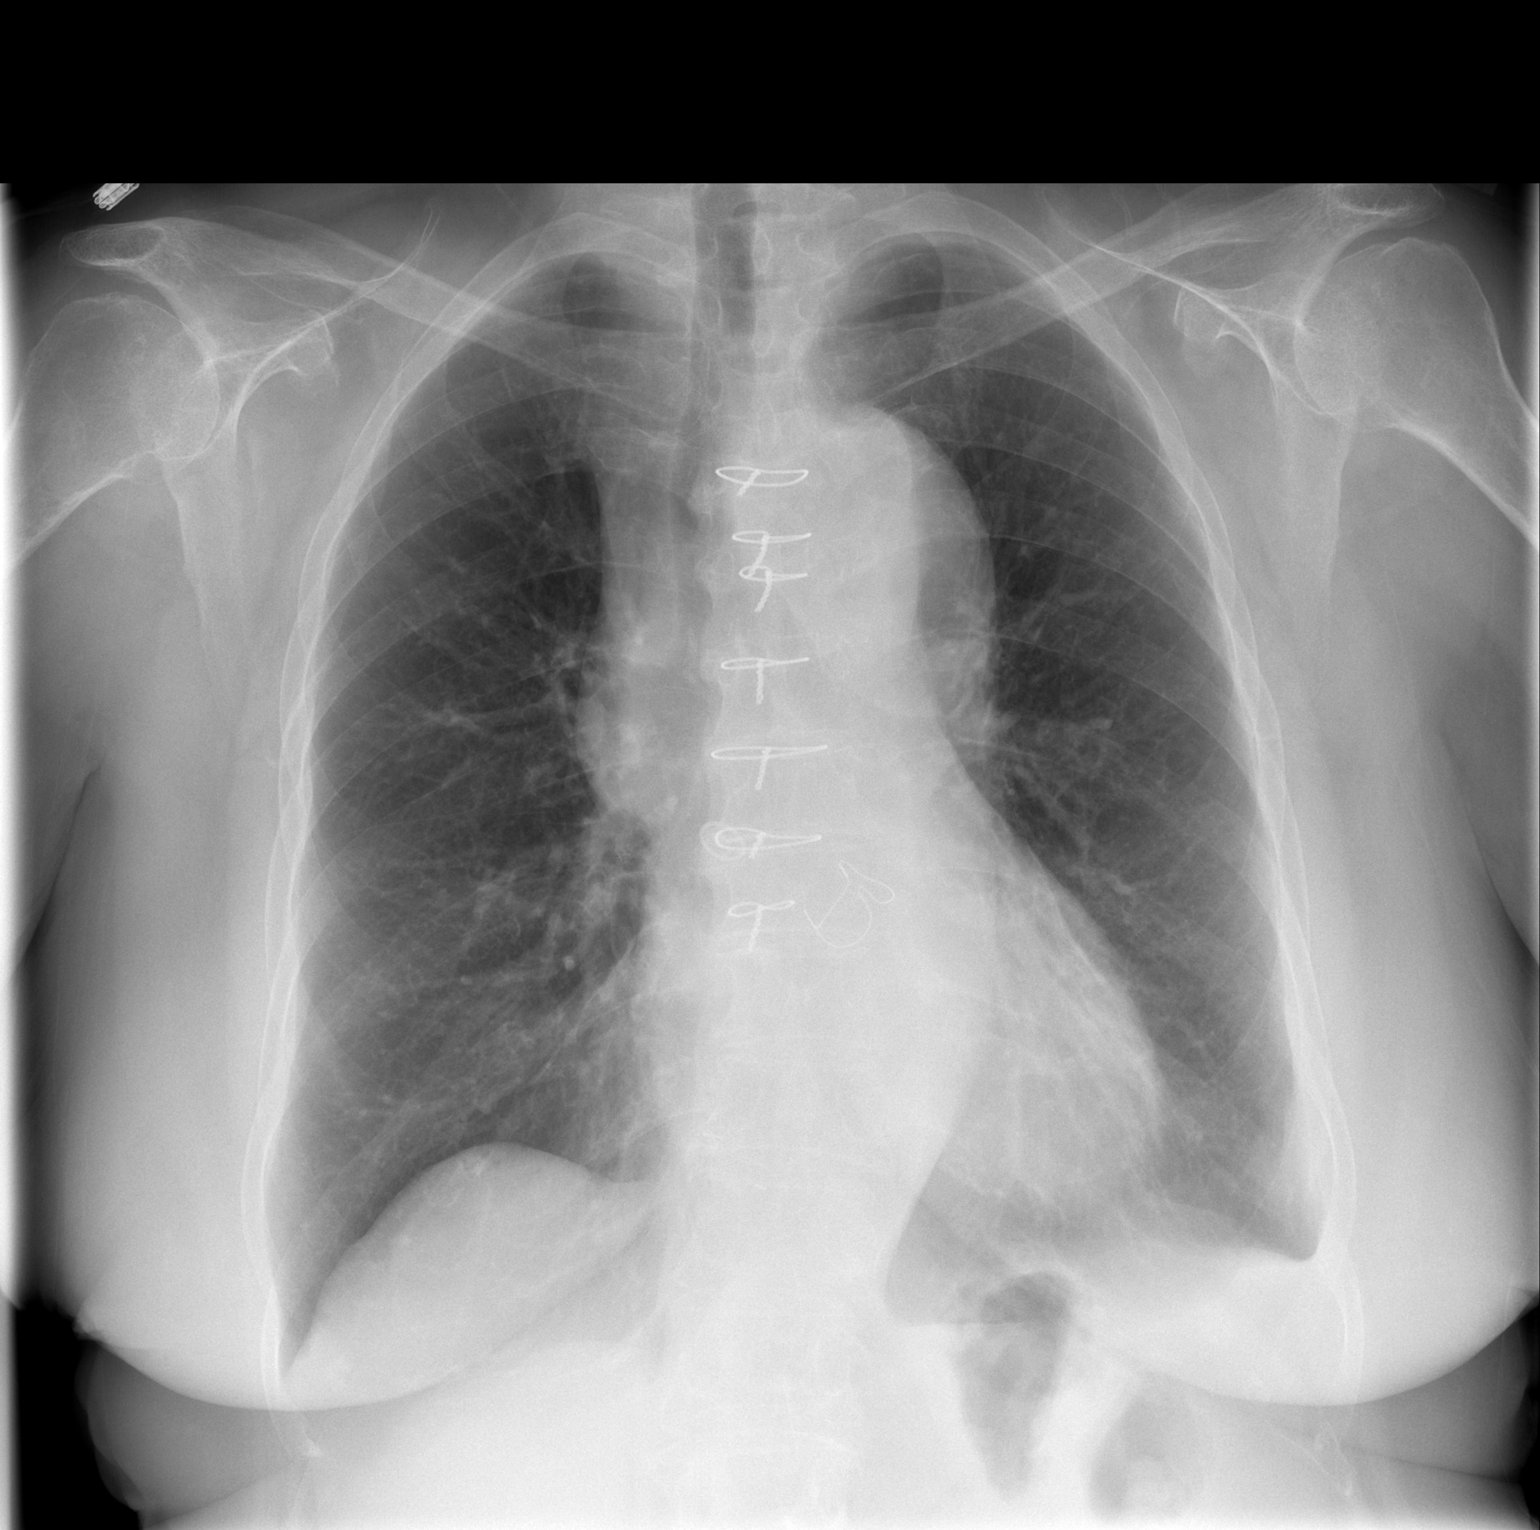

[w chest lat]
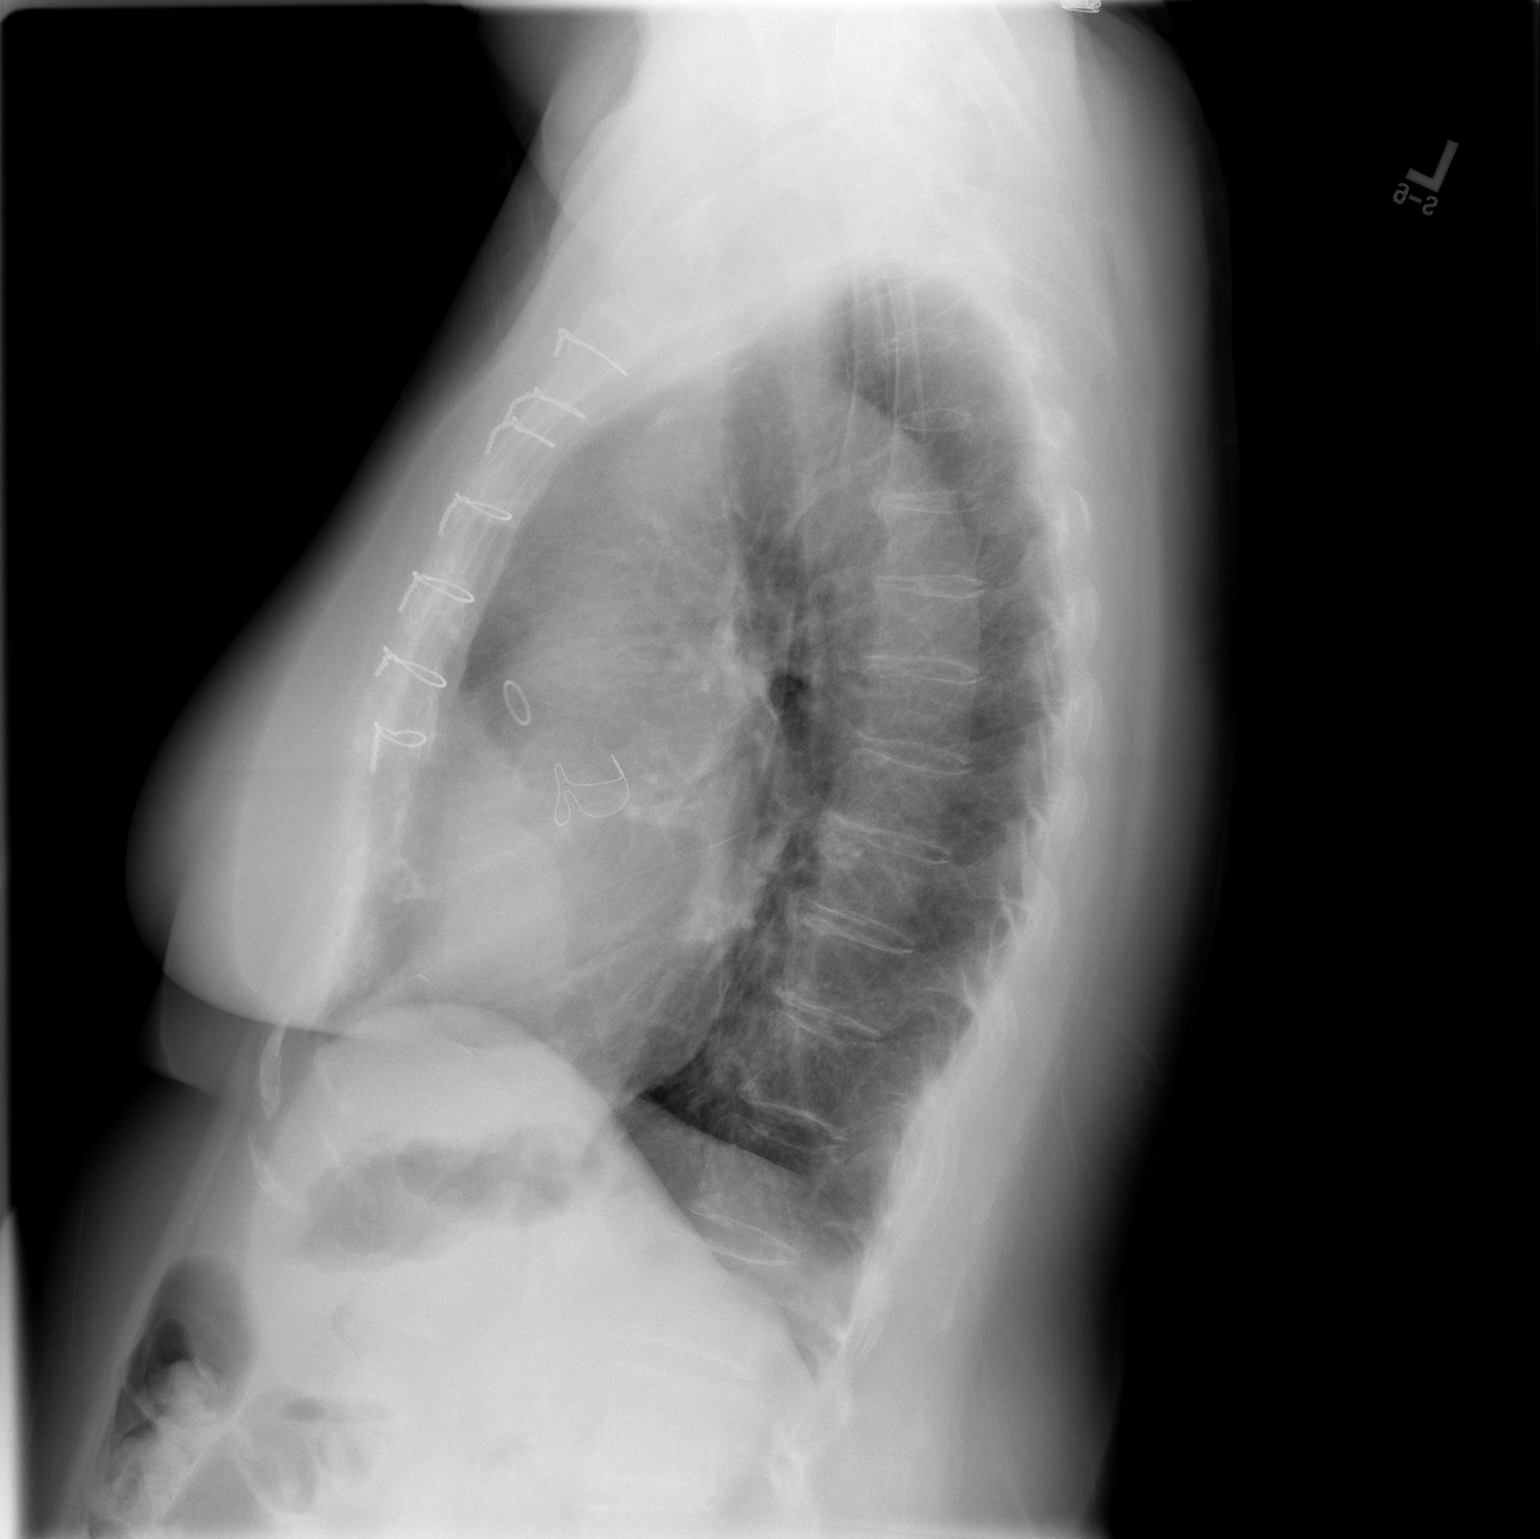

[2 of 2 positions shown; findings below may reference images not displayed]

FINDINGS: Heart size is normal.

There is a left pleural effusion, small.

No interstitial edema.

No airspace opacities.
IMPRESSION: 1. Small left effusion.
2. No edema or no air space disease.

## 2009-04-01 ENCOUNTER — Telehealth: Payer: Self-pay | Admitting: Pulmonary Disease

## 2009-05-21 ENCOUNTER — Telehealth (INDEPENDENT_AMBULATORY_CARE_PROVIDER_SITE_OTHER): Payer: Self-pay | Admitting: *Deleted

## 2009-05-22 ENCOUNTER — Ambulatory Visit: Payer: Self-pay | Admitting: Pulmonary Disease

## 2009-05-29 ENCOUNTER — Ambulatory Visit: Payer: Self-pay | Admitting: Pulmonary Disease

## 2009-06-02 LAB — CONVERTED CEMR LAB
Albumin: 3.6 g/dL (ref 3.5–5.2)
Basophils Relative: 0.8 % (ref 0.0–3.0)
Bilirubin, Direct: 0.1 mg/dL (ref 0.0–0.3)
CO2: 30 meq/L (ref 19–32)
Calcium: 8.8 mg/dL (ref 8.4–10.5)
Cholesterol: 258 mg/dL — ABNORMAL HIGH (ref 0–200)
Creatinine, Ser: 1.2 mg/dL (ref 0.4–1.2)
GFR calc non Af Amer: 45.54 mL/min (ref >60)
HDL: 48.5 mg/dL (ref >39.00)
Hemoglobin: 12.8 g/dL (ref 12.0–15.0)
Lymphocytes Relative: 17 % (ref 12.0–46.0)
MCHC: 33.1 g/dL (ref 30.0–36.0)
Monocytes Relative: 8.3 % (ref 3.0–12.0)
Neutro Abs: 3.2 10*3/uL (ref 1.4–7.7)
RBC: 4.36 M/uL (ref 3.87–5.11)
Total CHOL/HDL Ratio: 5
Total Protein: 6.7 g/dL (ref 6.0–8.3)
Triglycerides: 359 mg/dL — ABNORMAL HIGH (ref 0.0–149.0)
Vit D, 25-Hydroxy: 26 ng/mL — ABNORMAL LOW (ref 30–89)

## 2009-06-06 ENCOUNTER — Telehealth (INDEPENDENT_AMBULATORY_CARE_PROVIDER_SITE_OTHER): Payer: Self-pay | Admitting: *Deleted

## 2009-06-10 ENCOUNTER — Ambulatory Visit: Payer: Self-pay | Admitting: Pulmonary Disease

## 2009-06-12 ENCOUNTER — Telehealth (INDEPENDENT_AMBULATORY_CARE_PROVIDER_SITE_OTHER): Payer: Self-pay | Admitting: *Deleted

## 2009-06-18 ENCOUNTER — Ambulatory Visit: Payer: Self-pay | Admitting: Pulmonary Disease

## 2009-11-26 ENCOUNTER — Ambulatory Visit: Payer: Self-pay | Admitting: Pulmonary Disease

## 2009-11-27 ENCOUNTER — Ambulatory Visit: Payer: Self-pay | Admitting: Pulmonary Disease

## 2009-12-01 LAB — CONVERTED CEMR LAB
ALT: 14 units/L (ref 0–35)
CO2: 29 meq/L (ref 19–32)
Chloride: 103 meq/L (ref 96–112)
Direct LDL: 102.7 mg/dL
HDL: 41.1 mg/dL (ref >39.00)
Sodium: 142 meq/L (ref 135–145)
Total Bilirubin: 0.5 mg/dL (ref 0.3–1.2)
Total CHOL/HDL Ratio: 5
Triglycerides: 364 mg/dL — ABNORMAL HIGH (ref 0.0–149.0)

## 2009-12-02 ENCOUNTER — Ambulatory Visit: Payer: Self-pay | Admitting: Cardiology

## 2009-12-17 ENCOUNTER — Ambulatory Visit: Payer: Self-pay

## 2009-12-17 ENCOUNTER — Encounter: Payer: Self-pay | Admitting: Cardiology

## 2009-12-17 ENCOUNTER — Ambulatory Visit (HOSPITAL_COMMUNITY): Admission: RE | Admit: 2009-12-17 | Discharge: 2009-12-17 | Payer: Self-pay | Admitting: Cardiology

## 2009-12-17 ENCOUNTER — Ambulatory Visit: Payer: Self-pay | Admitting: Cardiology

## 2009-12-19 ENCOUNTER — Telehealth (INDEPENDENT_AMBULATORY_CARE_PROVIDER_SITE_OTHER): Payer: Self-pay | Admitting: *Deleted

## 2010-04-24 NOTE — Progress Notes (Signed)
Summary: PRESCRIPT  Phone Note Call from Patient   Caller: Patient Call For: Darianna Amy Summary of Call: NEED SOMETHING FOR COLD WOULD LIKE Z PAK PHARMACY [facility] Initial call taken by: Rickard Patience,  April 01, 2009 10:00 AM  Follow-up for Phone Call        pt c/o chest congestion, dry cough, and increased SOB x 3 dyas. Pt has tried Robutussin without relief. Pt denie fever. Please advise. Carron Curie CMA  April 01, 2009 10:11 AM   Additional Follow-up for Phone Call Additional follow up Details #1::        per SN--ok for pt to have zpak.  #1  take as directed.  thanks Randell Loop CMA  April 01, 2009 10:20 AM   rx sent. pt aware. Carron Curie CMA  April 01, 2009 10:31 AM     New/Updated Medications: ZITHROMAX Z-PAK 250 MG TABS (AZITHROMYCIN) as directed Prescriptions: ZITHROMAX Z-PAK 250 MG TABS (AZITHROMYCIN) as directed  #1 pak x 0   Entered by:   Carron Curie CMA   Authorized by:   Michele Mcalpine MD   Signed by:   Carron Curie CMA on 04/01/2009   Method used:   [facility] to        [facility] [address] [phone]* (retail)       [address]       [place], [place]  [address]       Ph: [phone] or [phone]       Fax: [phone]   RxID:   [id number]

## 2010-04-24 NOTE — Assessment & Plan Note (Signed)
Summary: Acute NP follow up - bronchitis   CC:   F/U congestionx X 6 days and Patient is concerned she3 may have Pneumonia.  History of Present Illness: 77 y/o WF    ~  Sep09:  she states that she is doing OK and this is confirmed by her husband who cares for her at home... she complains of excess urination and blames the Lasix and wants to decrease the dose (on 80mg Qam)... we checked her renal function (BUN= 17, Creat= 1.1) & BNP (353) & decided to keep everything the same... she notes that DrPlovsky has been adjusting her meds and she wants to get off them...    ~  Jan10:  states she's feeling great and more like herself... DrPlovsky has decreased her meds, stable & doing OK (confirmed by her husb)... weight stable 183# "I'm on a diet now"... we will f/u CXR and fasting labs today... OK H1N1 vaccine today.  ~  Apr10:  Hosp after fall at home w/ L1 compression fx & retropulsed fragment in canal- eval by DrElsner w/ rec for conservative management w/ brace, rest, rehab... tx to inpt rehab and DC'd home 07/09/08 followed by DrElsner, DrNorris, DrPlovsky... she reports improved w/ therapy & husb's good care- using Tramadol as needed pain, brace, walker, etc... DrPlovsky sees her every 3-4 mo & recently stopped her Depakote- now on Celexa alone.   ~  January 23, 2009:  here by herself today "so I could show you I could do it on my own"...she had f/u DrCrenshaw 9/10- BP sl incr & asked to monitor it at home, 2DEcho done & OK w/o progressive changes, but she stopped her Crestor5 ?why- asked to restart this med... had f/u DrElsner 8/10 for L1 compression- doing satis & "he released me"... had her flu shot last month.   ~  May 29, 2009:  once again she stopped her Crestor 5mg - FLP w/ [id number] & we discussed this- she wants PAVASTATIN 40mg /d OK... she states that "my mind is back where it should be" & "I don't have dementia"... states she's driving now- & I cautioned her and her husb about this.    June 10, 2009 --Pt presents for an acute office visit. She complains of chest congestionx for 6 days,. She called in last week, sent a  zpack-- on last day. Not better, still has thick mucus, wheezing and harsh cough. OTC not helping. Denies chest pain, dyspnea, orthopnea, hemoptysis, fever, n/v/d, edema, headache,   Medications Prior to Update: 1)  Ecotrin 325 Mg  Tbec (Aspirin) .... Take 1 Tablet By Mouth Once A Day 2)  Lopressor 50 Mg  Tabs (Metoprolol Tartrate) .... Take One Tablet By Mouth Two Times A Day 3)  Norvasc 5 Mg Tabs (Amlodipine Besylate) .... Take 1 Tablet By Mouth Once A Day 4)  Furosemide 40 Mg  Tabs (Furosemide) .... Take 2 Tabs By Mouth Once Daily in Am... 5)  Omeprazole 20 Mg  Cpdr (Omeprazole) .... Take 1 Tab By Mouth As Needed... 6)  Senna-Plus 8.6-50 Mg Tabs (Sennosides-Docusate Sodium) .... Take 2 Tablets By Mouth Daily 7)  Miralax  Powd (Polyethylene Glycol 3350) .... As Needed 8)  Tramadol Hcl 50 Mg Tabs (Tramadol Hcl) .... Take 1 Tab By Mouth Up To Three Times A Day As Needed For Pain... 9)  Ra Calcium 500 Mg Tabs (Calcium) .... Take 3 Tablets By Mouth Once Daily 10)  Vitamin D 1000 Unit Tabs (Cholecalciferol) .... Take 2 Tablets By Mouth Once A  Day 11)  Folic Acid 1 Mg  Tabs (Folic Acid) .... Take 1 Tablet By Mouth Once A Day 12)  Celexa 20 Mg  Tabs (Citalopram Hydrobromide) .... Give 1 and 1/2 Tab By Mouth At Bedtime As Directed By Drplovsky... 13)  Pravastatin Sodium 40 Mg Tabs (Pravastatin Sodium) .... Take 1 Tab By Mouth At Bedtime... 14)  Zithromax Z-Pak 250 Mg Tabs (Azithromycin) .... Take As Directed  Current Medications (verified): 1)  Ecotrin 325 Mg  Tbec (Aspirin) .... Take 1 Tablet By Mouth Once A Day 2)  Lopressor 50 Mg  Tabs (Metoprolol Tartrate) .... Take One Tablet By Mouth Two Times A Day 3)  Norvasc 5 Mg Tabs (Amlodipine Besylate) .... Take 1 Tablet By Mouth Once A Day 4)  Furosemide 40 Mg  Tabs (Furosemide) .... Take 2 Tabs By Mouth Once Daily in  Am... 5)  Omeprazole 20 Mg  Cpdr (Omeprazole) .... Take 1 Tab By Mouth As Needed... 6)  Senna-Plus 8.6-50 Mg Tabs (Sennosides-Docusate Sodium) .... Take 2 Tablets By Mouth Daily 7)  Miralax  Powd (Polyethylene Glycol 3350) .... As Needed 8)  Tramadol Hcl 50 Mg Tabs (Tramadol Hcl) .... Take 1 Tab By Mouth Up To Three Times A Day As Needed For Pain... 9)  Ra Calcium 500 Mg Tabs (Calcium) .... Take 3 Tablets By Mouth Once Daily 10)  Vitamin D 1000 Unit Tabs (Cholecalciferol) .... Take 2 Tablets By Mouth Once A Day 11)  Folic Acid 1 Mg  Tabs (Folic Acid) .... Take 1 Tablet By Mouth Once A Day 12)  Celexa 20 Mg  Tabs (Citalopram Hydrobromide) .... Give 1 and 1/2 Tab By Mouth At Bedtime As Directed By Drplovsky... 13)  Pravastatin Sodium 40 Mg Tabs (Pravastatin Sodium) .... Take 1 Tab By Mouth At Bedtime... 14)  Zithromax Z-Pak 250 Mg Tabs (Azithromycin) .... Take As Directed  Allergies: 1)  ! Pcn 2)  ! Sulfa 3)  ! Biaxin 4)  ! * Ivp Dye 5)  ! Prednisone 6)  ! * Statins 7)  ! * Cephtin  Past History:  Past Medical History: Last updated: 05/29/2009  HEARING LOSS (ICD-389.9) BRONCHITIS, RECURRENT (ICD-491.9) Hx of PNEUMONIA (ICD-486) HYPERTENSION (ICD-401.9) CORONARY ARTERY DISEASE (ICD-414.00) Hx of VALVULAR HEART DISEASE (ICD-424.90) Hx of ATRIAL FIBRILLATION (ICD-427.31) CEREBROVASCULAR ACCIDENT, HX OF (ICD-V12.50) HYPERLIPIDEMIA (ICD-272.4) GERD (ICD-530.81) DIVERTICULOSIS OF COLON (ICD-562.10) HEMORRHOIDS (ICD-455.6) RENAL INSUFFICIENCY (ICD-588.9) Hx of UTI (ICD-599.0) DEGENERATIVE JOINT DISEASE (ICD-715.90) BACK PAIN, LUMBAR (ICD-724.2) OSTEOPOROSIS (ICD-733.00) DEMENTIA (ICD-294.8) ANXIETY (ICD-300.00) ANEMIA (ICD-285.9)  Past Surgical History: Last updated: 05/29/2009  HIP REPLACEMENT, LEFT, HX OF (ICD-V43.64) S/P HYSTERECTOMY INTRAOCULAR LENS IMPLANT, BILATERAL, HX OF (ICD-V43.1) CATARACT EXTRACTIONS, BILATERAL, HX OF (ICD-V45.61) CORONARY ARTERY BYPASS GRAFT,  HX OF (ICD-V45.81) AORTIC VALVE REPLACEMENT, HX OF (ICD-V43.3)  Family History: Last updated: 2008/09/24 mother deceased father deceased pt was very young when parents died and is unsure of their age or reasons. 1 brother deceased age 45  hx of DM 1 sister alive age 24  hx of DM 1 brother deceaed age 31   hx of DM  Social History: Last updated: 09/24/2008 Retired  Married  Tobacco Use - Never Alcohol Use - no caffeine use---coffee daily  Risk Factors: Smoking Status: never (05/08/2008)  Review of Systems      See HPI  Vital Signs:  Patient profile:   77 year old female Weight:      181.8 pounds O2 Sat:      96 % on Room air Temp:  97 degrees F oral Pulse rate:   65 / minute BP sitting:   136 / 72  (left arm) Cuff size:   regular  Vitals Entered By: Boone Master CNA (June 10, 2009 4:09 PM)  O2 Sat at Rest %:  96 O2 Flow:  Room air CC:  F/U congestionx X 6 days, Patient is concerned she3 may have Pneumonia Is Patient Diabetic? No Pain Assessment Patient in pain? no      Comments Medications reviewed with patient Daytime contact number verified with patient. Boone Master CNA  June 10, 2009 4:31 PM    Physical Exam  Additional Exam:  WD, sl overweight, 77 y/o WF in NAD... flattened affect apparent, without change... GENERAL:  Alert & oriented x3... HEENT:  [place]/AT, EOM-wnl, PERRLA, EACs-clear, TMs-wnl, NOSE-clear, THROAT- sl dry, clear & wnl. NECK:  Supple w/ fairROM; no JVD; normal carotid impulses w/o bruits; no thyromegaly or nodules palpated; no lymphadenopathy. CHEST:  Coarse BS w/ faint exp wheezing .  HEART: s/p med sternotomy, regular,  gr1/6 SEM without rubs or gallops apprec... ABDOMEN:  Soft & nontender; normal bowel sounds; no organomegaly or masses detected. EXT: without deformities, s/p left THR, mod arthritic changes; no varicose veins/ +venous insuffic/ tr edema is noted...     Impression & Recommendations:  Problem # 1:  BRONCHITIS,  RECURRENT (ICD-491.9)  Flare REC: Xopenex neb given in office.  Avelox 400mg  once daily for 7 days Mucinex DM two times a day as needed cough/congestion  Medrol dose pack  Please contact office for sooner follow up if symptoms do not improve or worsen  follow up Dr. Kriste Basque as scheduled.   Orders: Nebulizer Tx (29562) Est. Patient Level IV (13086)  Medications Added to Medication List This Visit: 1)  Avelox 400 Mg Tabs (Moxifloxacin hcl) .Marland Kitchen.. 1 by mouth once daily 2)  Medrol (pak) 4 Mg Tabs (Methylprednisolone) .... Take as directed.  Complete Medication List: 1)  Ecotrin 325 Mg Tbec (Aspirin) .... Take 1 tablet by mouth once a day 2)  Lopressor 50 Mg Tabs (Metoprolol tartrate) .... Take one tablet by mouth two times a day 3)  Norvasc 5 Mg Tabs (Amlodipine besylate) .... Take 1 tablet by mouth once a day 4)  Furosemide 40 Mg Tabs (Furosemide) .... Take 2 tabs by mouth once daily in am... 5)  Omeprazole 20 Mg Cpdr (Omeprazole) .... Take 1 tab by mouth as needed... 6)  Senna-plus 8.6-50 Mg Tabs (Sennosides-docusate sodium) .... Take 2 tablets by mouth daily 7)  Miralax Powd (Polyethylene glycol 3350) .... As needed 8)  Tramadol Hcl 50 Mg Tabs (Tramadol hcl) .... Take 1 tab by mouth up to three times a day as needed for pain.Marland KitchenMarland Kitchen 9)  Ra Calcium 500 Mg Tabs (Calcium) .... Take 3 tablets by mouth once daily 10)  Vitamin D 1000 Unit Tabs (Cholecalciferol) .... Take 2 tablets by mouth once a day 11)  Folic Acid 1 Mg Tabs (Folic acid) .... Take 1 tablet by mouth once a day 12)  Celexa 20 Mg Tabs (Citalopram hydrobromide) .... Give 1 and 1/2 tab by mouth at bedtime as directed by drplovsky... 13)  Pravastatin Sodium 40 Mg Tabs (Pravastatin sodium) .... Take 1 tab by mouth at bedtime... 14)  Zithromax Z-pak 250 Mg Tabs (Azithromycin) .... Take as directed 15)  Avelox 400 Mg Tabs (Moxifloxacin hcl) .Marland Kitchen.. 1 by mouth once daily 16)  Medrol (pak) 4 Mg Tabs (Methylprednisolone) .... Take as  directed.  Patient Instructions: 1)  Avelox 400mg   once daily for 7 days 2)  Mucinex DM two times a day as needed cough/congestion  3)  Medrol dose pack  4)  Please contact office for sooner follow up if symptoms do not improve or worsen  5)  follow up Dr. Kriste Basque as scheduled.  Prescriptions: MEDROL (PAK) 4 MG TABS (METHYLPREDNISOLONE) take as directed.  #1 x 0   Entered and Authorized by:   Rubye Oaks NP   Signed by:   Zarra Geffert NP on 06/10/2009   Method used:   [facility]        [facility] [address] [phone]* (retail)       [address]       [place], [place]  [address]       Ph: [phone] or [phone]       Fax: [phone]   RxID:   [id number] AVELOX 400 MG TABS (MOXIFLOXACIN HCL) 1 by mouth once daily  #7 x 0   Entered and Authorized by:   Rubye Oaks NP   Signed by:   Casmere Hollenbeck NP on 06/10/2009   Method used:   [facility]ronically to        [facility] [address] [phone]* (retail)       [address]       [place], [place]  [address]       Ph: [phone] or [phone]       Fax: [phone]   RxID:   [id number]

## 2010-04-24 NOTE — Assessment & Plan Note (Signed)
Summary: 1 WEEK FOLLOW UP/LA   CC:  Add-on appt for f/u of bronchitic exacerbation....  History of Present Illness: 77 y/o WF w/ mult med problems here for a follow up visit...    ~  Apr10:  Hosp after fall at home w/ L1 compression fx & retropulsed fragment in canal- eval by DrElsner w/ rec for conservative management w/ brace, rest, rehab... tx to inpt rehab and DC'd home 07/09/08 followed by DrElsner, DrNorris, DrPlovsky... she reports improved w/ therapy & husb's good care- using Tramadol as needed pain, brace, walker, etc... DrPlovsky sees her every 3-4 mo & recently stopped her Depakote- now on Celexa alone.   ~  January 23, 2009:  here by herself today "so I could show you I could do it on my own"...she had f/u DrCrenshaw 9/10- BP sl incr & asked to monitor it at home, 2DEcho done & OK w/o progressive changes, but she stopped her Crestor5 ?why- asked to restart this med... had f/u DrElsner 8/10 for L1 compression- doing satis & "he released me"... had her flu shot last month.   ~  May 29, 2009:  once again she stopped her Crestor 5mg - FLP w/ TChol 258 & we discussed this- she wants PAVASTATIN 40mg /d >OK... she states that "my mind is back where it should be" & "I don't have dementia"... states she's driving now- & I cautioned her and her husb about this.   ~  June 18, 2009:  Add-on for follow up of recent AB exac> developed URI/ bronchitic exac & had ZPak called in but no better & came to see TP 3/21 w/ chest congestion, cough, thick mucus, wheezing documented... changed to Avelox, given Dosepak, Mucinex, Fluids... improved but stopped the dosepak "it swells me up" & ?rectal bleeding... now almost back to baseline, requests cough syrup...   Problem List:  HEARING LOSS - ENT eval 9/10 by DrTeoh...  BRONCHITIS, RECURRENT (ICD-491.9) - ** see above ** Hx of PNEUMONIA (ICD-486)  HYPERTENSION (ICD-401.9) - on ASA 325mg /d, LOPRESSOR 50mg Bid, NORVASC 5mg /d, & LASIX 40mg - 2tabsQam...  BP=140/84 & doing well... denies HA, visual changes, CP, palipit, dizziness, syncope, change in dyspnea, or incr edema...  CORONARY ARTERY DISEASE (ICD-414.00) - S/P CABG 2003 w/ vein graft to PDA... no CP, palpit, or change in dyspnea... she is still pretty sedentary... followed by Aletta Edouard for Cards...  ~   last NuclearStressTest 6/08 showed RBBB, no ischemia, EF=76%...  ~  labs 9/09 showed BNP= 353 on Lasix 80mg /d.  ~  labs 1/10 showed BNP= 204  ~  saw DrCrenshaw 9/10 w/ 2DEcho- mild LVH, EF=60%, wall motion norm, tissue AoV prosthesis- OK.  Hx of VALVULAR HEART DISEASE (ICD-424.90) - S/P tissue AVR & Thor AA repair 2003.  ~  she knows about the need for SBE prophylaxis.  ~  2DEcho 3/09 showed a bioprosthetic valve w/ 21mm gradient, & norm LVF w/ EF= 55-60%.  ~  2DEcho 9/10- mild LVH, EF=60%, wall motion norm, tissue AoV prosthesis- working well...  Hx of ATRIAL FIBRILLATION (ICD-427.31) - PAF prev on coumadin & stopped 2/09... she is holding NSR...  CEREBROVASCULAR ACCIDENT, HX OF (ICD-V12.50) - Eval and Rx DrSethi...  ~  CDoppler 10/08 w/ tort ICA's, no plaque, normal carotids.  ~  CDoppler 4/10 at [facility] by [facility] showed bilat 40-59% ICA stenoses believed to be from tortuosity of vessels.  HYPERLIPIDEMIA (ICD-272.4) - sher stopped the Crestor 5mg  again... she will agree to try Prav40.  ~  last FLP 1/08 showed  TChol 128, TG 94, HDL 40, LDL 69...  ~  FLP 1/10 on diet showed TChol 247, TG 200, HDL 40, LDL 166... rec> start Crestor5.  ~  FLP 7/10 on Cres5 showed TChol 157, TG 202, HDL 42, LDL 85... rec> continue same.  ~  11/10: pt reports that she stopped the Crestor ?why- ?mild symptoms, advised to restart Rx.  ~  FLP 3/11 on diet alone showed TChol 258, TG 359, HDL 49, LDL 140... start PRAV40.  GERD (ICD-530.81) - on OMEPRAZOLE 20mg /d Prn & controls her symptoms.  DIVERTICULOSIS OF COLON (ICD-562.10) & HEMORRHOIDS (ICD-455.6) - uses Miralax Prn...  ~  last colon 10/00  [facility] w/ divertics, hems, cecal AVM.  RENAL INSUFFICIENCY (ICD-588.9) & Hx of UTI (ICD-599.0)  ~  labs 3/09 showed BUN= 20, Creat= 1.4  ~  labs 9/09 showed BUN= 17, Creat= 1.1  ~  labs 7/10 showed BUN= 21, Creat= 1.4  ~  labs 3/11 showed BUN= 17, Creat= 1.2  DEGENERATIVE JOINT DISEASE (ICD-715.90) - s/p left THR 7/04 by DrAlusio.  ~  labs 7/10 showed Vit D level = 30... rec to start Vit D 1000 u daily OTC.  ~  labs 3/11 showed Vit D level = 26... rec incr Vit D to 2000 u daily.  BACK PAIN, LUMBAR (ICD-724.2) - Hosp 4/10 after fall at home w/ L1 compression fx & retropulsed fragment in canal- eval by DrElsner w/ rec for conservative management w/ brace, rest, rehab...  DEMENTIA (ICD-294.8) - Prev hx DELIRIUM w/ Abn MRI showing atrophy, sm vessel dis, old infarct... then hosp 12/08 w/ aud hallucinations & psych rx from DrWilliford w/ Risperdal, Depakote, Ativan... 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 apparently no better off coumadin, but they left her off it at disch to the nursing home)... she is now stable on a complex psychotopic regimen per [facility], Psychiatry... see med list...  ~  1/10:  now off Risperdal... on Depakote 250mg /d, Celexa 20mg - 1.5tabsQhs.  ~  6/10:  now off Depakote, on Celexa alone & followed by DrPlovsky.  ANXIETY (ICD-300.00)  ANEMIA (ICD-285.9)  ~  labs 3/09 showed Hg= 11.7  ~  labs 9/09 showed Hg 12.2  ~  labs 1/10 showed Hg= 12.8  ~  labs 7/10 showed Hg= 13.0  ~  labs 3/11 showed Hg= 12.8   Allergies: 1)  ! Pcn 2)  ! Sulfa 3)  ! Biaxin 4)  ! * Ivp Dye 5)  ! Prednisone 6)  ! * Statins 7)  ! * Cephtin  Comments:  Nurse/Medical Assistant: The patient's medications and allergies were reviewed with the patient and were updated in the Medication and Allergy  Lists.  Past History:  Past Medical History:  HEARING LOSS (ICD-389.9) BRONCHITIS, RECURRENT (ICD-491.9) Hx of PNEUMONIA (ICD-486) HYPERTENSION (ICD-401.9) CORONARY ARTERY DISEASE (ICD-414.00) Hx of VALVULAR HEART DISEASE (ICD-424.90) Hx of ATRIAL FIBRILLATION (ICD-427.31) CEREBROVASCULAR ACCIDENT, HX OF (ICD-V12.50) HYPERLIPIDEMIA (ICD-272.4) GERD (ICD-530.81) DIVERTICULOSIS OF COLON (ICD-562.10) HEMORRHOIDS (ICD-455.6) RENAL INSUFFICIENCY (ICD-588.9) Hx of UTI (ICD-599.0) DEGENERATIVE JOINT DISEASE (ICD-715.90) BACK PAIN, LUMBAR (ICD-724.2) OSTEOPOROSIS (ICD-733.00) DEMENTIA (ICD-294.8) ANXIETY (ICD-300.00) ANEMIA (ICD-285.9)  Past Surgical History:  HIP REPLACEMENT, LEFT, HX OF (ICD-V43.64) S/P HYSTERECTOMY INTRAOCULAR LENS IMPLANT, BILATERAL, HX OF (ICD-V43.1) CATARACT EXTRACTIONS, BILATERAL, HX OF (ICD-V45.61) CORONARY ARTERY BYPASS GRAFT, HX OF (ICD-V45.81) AORTIC VALVE REPLACEMENT, HX OF (ICD-V43.3)  Family History: Reviewed history from 09/19/2008 and no changes required. mother deceased father deceased pt was very young when parents died and is unsure of their age or reasons. 1 brother deceased age 45  hx of DM 1 sister alive age 21  hx of DM 1 brother deceaed age 79   hx of DM  Social History: Reviewed history from 09/19/2008 and no changes required. Retired  Married  Tobacco Use - Never Alcohol Use - no caffeine use---coffee daily  Review of Systems      See HPI       The patient complains of dyspnea on exertion.  The patient denies anorexia, fever, weight loss, weight gain, vision loss, decreased hearing, hoarseness, chest pain, syncope, peripheral edema, prolonged cough, headaches, hemoptysis, abdominal pain, melena, hematochezia, severe indigestion/heartburn, hematuria, incontinence, muscle weakness, suspicious skin lesions, transient blindness, difficulty walking, depression, unusual weight change, abnormal bleeding, enlarged lymph nodes, and  angioedema.    Vital Signs:  Patient profile:   77 year old female Height:      63 inches Weight:      179.25 pounds O2 Sat:      100 % on Room air Temp:     96.9 degrees F oral Pulse rate:   57 / minute BP sitting:   140 / 84  (left arm) Cuff size:   regular  Vitals Entered By: Randell Loop CMA (June 18, 2009 12:00 PM)  O2 Sat at Rest %:  100 O2 Flow:  Room air CC: Add-on appt for f/u of bronchitic exacerbation... Is Patient Diabetic? No Pain Assessment Patient in pain? no      Comments meds updated today   Physical Exam  Additional Exam:  WD, sl overweight, 77 y/o WF in NAD... flattened affect apparent, without change... GENERAL:  Alert & oriented x3... HEENT:  [place]/AT, EOM-wnl, PERRLA, EACs-clear, TMs-wnl, NOSE-clear, THROAT- sl dry, clear & wnl. NECK:  Supple w/ fairROM; no JVD; normal carotid impulses w/o bruits; no thyromegaly or nodules palpated; no lymphadenopathy. CHEST:  Clear to P & A; without wheezes/ rales/ or rhonchi heard... mild congested cough... HEART: s/p med sternotomy, regular,  gr1/6 SEM without rubs or gallops apprec... ABDOMEN:  Soft & nontender; normal bowel sounds; no organomegaly or masses detected. EXT: without deformities, s/p left THR, mod arthritic changes; no varicose veins/ +venous insuffic/ tr edema is noted... NEURO:  CN's intact;  back brace, L1 compression, no focal neuro changes... DERM:  No lesions noted; no rash etc...    Impression & Recommendations:  Problem # 1:  BRONCHITIS, RECURRENT (ICD-491.9) She is improved... Rx w/ Hydromet cough syrup Prn... she doesn't tolerate Pred very well.  Problem # 2:  HYPERTENSION (ICD-401.9) Controlled-  same meds. Her updated medication list for this problem includes:    Lopressor 50 Mg Tabs (Metoprolol tartrate) .Marland Kitchen... Take one tablet by mouth two times a day    Norvasc 5 Mg Tabs (Amlodipine besylate) .Marland Kitchen... Take 1 tablet by mouth once a day    Furosemide 40 Mg Tabs (Furosemide) .Marland Kitchen... Take 2  tabs by mouth once daily in am...  Problem # 3:  CARDIAC>>> She has CAD, s/p CABG, s/p AVR w/ tissue valve & Thor AA repair, hx PAF- holding NSR now, etc... followed by DrCrenshaw & stable...  Problem # 4:  HYPERLIPIDEMIA (ICD-272.4) Taking the Pravastatin 40mg /d at present... Her updated medication list for this problem includes:    Pravastatin Sodium 40 Mg Tabs (Pravastatin sodium) .Marland Kitchen... Take 1 tab by mouth at bedtime.Marland KitchenMarland Kitchen  Problem # 5:  OTHER MEDICAL PROBLEMS AS NOTED>>> See my 05/29/09 note for further details...  Complete Medication List: 1)  Ecotrin 325 Mg Tbec (Aspirin) .... Take 1 tablet by mouth once a day 2)  Lopressor 50 Mg Tabs (Metoprolol tartrate) .... Take one tablet by mouth two times a day 3)  Norvasc 5 Mg Tabs (Amlodipine besylate) .... Take 1 tablet by mouth once a day 4)  Furosemide 40 Mg Tabs (Furosemide) .... Take 2 tabs by mouth once daily in am... 5)  Pravastatin Sodium 40 Mg Tabs (Pravastatin sodium) .... Take 1 tab by mouth at bedtime.Marland KitchenMarland Kitchen 6)  Omeprazole 20 Mg Cpdr (Omeprazole) .... Take 1 tab by mouth as needed... 7)  Senna-plus 8.6-50 Mg Tabs (Sennosides-docusate sodium) .... Take 2 tablets by mouth daily 8)  Miralax Powd (Polyethylene glycol 3350) .... As needed 9)  Tramadol Hcl 50 Mg Tabs (Tramadol hcl) .... Take 1 tab by mouth up to three times a day as needed for pain... 10)  Ra Calcium 500 Mg Tabs (Calcium) .... Take 3 tablets by mouth once daily 11)  Vitamin D 1000 Unit Tabs (Cholecalciferol) .... Take 2 tablets by mouth once a day 12)  Folic Acid 1 Mg Tabs (Folic acid) .... Take 1 tablet by mouth once a day 13)  Celexa 20 Mg Tabs (Citalopram hydrobromide) .... Give 1 and 1/2 tab by mouth at bedtime as directed by drplovsky... 14)  Hydromet 5-1.5 Mg/72ml Syrp (Hydrocodone-homatropine) .... Take 1 tsp by mouth every 6 h as needed for cough...  Other Orders: Prescription Created Electronically [id number])  Patient Instructions: 1)  Today we updated your med list-  see below.... 2)  We wrote a new perscription for a cough syrup that you may use up to every 6H as needed... 3)  Rest at home, drink plenty of fluids, continue the Mucinex, etc... 4)  Please keep your previously scheduled follow up appt... Prescriptions: HYDROMET 5-1.5 MG/5ML SYRP (HYDROCODONE-HOMATROPINE) take 1 tsp by mouth every 6 H as needed for cough...  #4oz x one   Entered and Authorized by:   Michele Mcalpine MD   Signed by:   Michele Mcalpine MD on 06/18/2009   Method used:   Print then Give to Patient   RxID:   [id number]

## 2010-04-24 NOTE — Assessment & Plan Note (Signed)
Summary: 1 yr/dmp  Medications Added RA CALCIUM 500 MG TABS (CALCIUM) take 2 tablets by mouth once daily VITAMIN D 2000 UNIT CAPS (CHOLECALCIFEROL) 1 tab by mouth once daily        History of Present Illness: [name] is a pleasant  female who has a history of coronary disease status post bypassing graft, history of thoracic aortic aneurysm repair, as well as aortic valve replacement and paroxysmal atrial fibrillation.  Last echocardiogram in March of 2010 revealed normal LV function with ejection fraction of 55-60%.  There was a bioprosthetic aortic valve with a mean gradient of 13 mmHg.  There was mild  left atrial enlargement. Last Myoview was performed on September 02, 2006. At that time she had normal perfusion with an ejection fraction of 76%. Carotid Dopplers performed in April of 2010 revealed a 40-59% bilateral stenosis. Dr. Kriste Basque is following this. Note she has also had problems with dementia with psychotic features. I last saw her in September of 2010. Since then the patient has dyspnea with more extreme activities but not with routine activities. It is relieved with rest. It is not associated with chest pain. There is no orthopnea, PND or pedal edema. There is no syncope or palpitations. There is no exertional chest pain.   Current Medications (verified): 1)  Ecotrin 325 Mg  Tbec (Aspirin) .... Take 1 Tablet By Mouth Once A Day 2)  Lopressor 50 Mg  Tabs (Metoprolol Tartrate) .... Take One Tablet By Mouth Two Times A Day 3)  Norvasc 5 Mg Tabs (Amlodipine Besylate) .... Take 1 Tablet By Mouth Once A Day 4)  Furosemide 40 Mg  Tabs (Furosemide) .... Take 2 Tabs By Mouth Once Daily in Am... 5)  Pravastatin Sodium 40 Mg Tabs (Pravastatin Sodium) .... Take 1 Tab By Mouth At Bedtime.Marland KitchenMarland Kitchen 6)  Omeprazole 20 Mg  Cpdr (Omeprazole) .... Take 1 Tab By Mouth As Needed... 7)  Miralax  Powd (Polyethylene Glycol 3350) .... As Needed 8)  Senna-Plus 8.6-50 Mg Tabs (Sennosides-Docusate Sodium) .... Take 2  Tablets By Mouth Daily 9)  Ra Calcium 500 Mg Tabs (Calcium) .... Take 2 Tablets By Mouth Once Daily 10)  Vitamin D 2000 Unit Caps (Cholecalciferol) .Marland Kitchen.. 1 Tab By Mouth Once Daily 11)  Folic Acid 1 Mg  Tabs (Folic Acid) .... Take 1 Tablet By Mouth Once A Day 12)  Celexa 20 Mg  Tabs (Citalopram Hydrobromide) .... Give 1 and 1/2 Tab By Mouth At Bedtime As Directed By Drplovsky...  Allergies: 1)  ! Pcn 2)  ! Sulfa 3)  ! Biaxin 4)  ! * Ivp Dye 5)  ! Prednisone 6)  ! * Statins 7)  ! * Cephtin  Past History:  Past Medical History: HEARING LOSS (ICD-389.9) BRONCHITIS, RECURRENT (ICD-491.9) HYPERTENSION (ICD-401.9) CORONARY ARTERY DISEASE (ICD-414.00) AORTIC VALVE REPLACEMENT, HX OF (ICD-V43.3) Hx of ATRIAL FIBRILLATION (ICD-427.31) CEREBROVASCULAR ACCIDENT, HX OF (ICD-V12.50) CAROTID STENOSIS (ICD-433.10) HYPERLIPIDEMIA (ICD-272.4) GERD (ICD-530.81) DIVERTICULOSIS OF COLON (ICD-562.10) HEMORRHOIDS (ICD-455.6) RENAL INSUFFICIENCY (ICD-588.9) DEGENERATIVE JOINT DISEASE (ICD-715.90) OSTEOPOROSIS (ICD-733.00) DEMENTIA (ICD-294.8) ANXIETY (ICD-300.00) ANEMIA (ICD-285.9)  Past Surgical History: Reviewed history from 11/26/2009 and no changes required. HIP REPLACEMENT, LEFT, HX OF (ICD-V43.64) S/P HYSTERECTOMY INTRAOCULAR LENS IMPLANT, BILATERAL, HX OF (ICD-V43.1) CATARACT EXTRACTIONS, BILATERAL, HX OF (ICD-V45.61) CORONARY ARTERY BYPASS GRAFT, HX OF (ICD-V45.81) AORTIC VALVE REPLACEMENT, HX OF (ICD-V43.3)  Social History: Reviewed history from 11/26/2009 and no changes required. Retired  Married  Tobacco Use - Never Alcohol Use - no caffeine use---coffee daily  Review of Systems  Some arthritis but no fevers or chills, productive cough, hemoptysis, dysphasia, odynophagia, melena, hematochezia, dysuria, hematuria, rash, seizure activity, orthopnea, PND, pedal edema, claudication. Remaining systems are negative.   Vital Signs:  Patient profile:   77 year old  female Height:      63 inches Weight:      176 pounds BMI:     31.29 Pulse rate:   60 / minute Resp:     14 per minute BP sitting:   131 / 71  (left arm)  Vitals Entered By: [name] (December 02, 2009 11:29 AM)  Physical Exam  General:  Well-developed well-nourished in no acute distress.  Skin is warm and dry.  HEENT is normal.  Neck is supple. No thyromegaly.  Chest is clear to auscultation with normal expansion.  Cardiovascular exam is regular rate and rhythm. 2/6 systolic murmur left sternal border. Abdominal exam nontender or distended. No masses palpated. Extremities show no edema. neuro grossly intact  Ears:  deformity of both pinna.     EKG  Procedure date:  12/02/2009  Findings:      Sinus rhythm at a rate of 60. Right bundle branch block. Possible prior lateral infarct. Left axis deviation.  Impression & Recommendations:  Problem # 1:  THORACIC AORTIC ANEURYSM,HX OF (ICD-441.2)  Problem # 2:  HYPERTENSION (ICD-401.9) Blood pressure controlled on present medications. Will continue. Renal function and potassium monitored by primary care. Her updated medication list for this problem includes:    Ecotrin 325 Mg Tbec (Aspirin) .Marland Kitchen... Take 1 tablet by mouth once a day    Lopressor 50 Mg Tabs (Metoprolol tartrate) .Marland Kitchen... Take one tablet by mouth two times a day    Norvasc 5 Mg Tabs (Amlodipine besylate) .Marland Kitchen... Take 1 tablet by mouth once a day    Furosemide 40 Mg Tabs (Furosemide) .Marland Kitchen... Take 2 tabs by mouth once daily in am...  Problem # 3:  CORONARY ARTERY DISEASE (ICD-414.00) Continue aspirin, beta blocker and statin. Her updated medication list for this problem includes:    Ecotrin 325 Mg Tbec (Aspirin) .Marland Kitchen... Take 1 tablet by mouth once a day    Lopressor 50 Mg Tabs (Metoprolol tartrate) .Marland Kitchen... Take one tablet by mouth two times a day    Norvasc 5 Mg Tabs (Amlodipine besylate) .Marland Kitchen... Take 1 tablet by mouth once a day  Problem # 4:  AORTIC VALVE  REPLACEMENT, HX OF (ICD-V43.3) Repeat echocardiogram. Continued SBE prophylaxis. Orders: Echocardiogram (Echo)  Problem # 5:  Hx of ATRIAL FIBRILLATION (ICD-427.31) Continue aspirin and beta blocker. Not a Coumadin candidate history of falls. Her updated medication list for this problem includes:    Ecotrin 325 Mg Tbec (Aspirin) .Marland Kitchen... Take 1 tablet by mouth once a day    Lopressor 50 Mg Tabs (Metoprolol tartrate) .Marland Kitchen... Take one tablet by mouth two times a day  Problem # 6:  CAROTID STENOSIS (ICD-433.10) Continue aspirin and statin. Schedule followup carotids. Her updated medication list for this problem includes:    Ecotrin 325 Mg Tbec (Aspirin) .Marland Kitchen... Take 1 tablet by mouth once a day  Orders: Carotid Duplex (Carotid Duplex)  Problem # 7:  HYPERLIPIDEMIA (ICD-272.4) Continue statin. Lipid and liver monitored by primary care. Her updated medication list for this problem includes:    Pravastatin Sodium 40 Mg Tabs (Pravastatin sodium) .Marland Kitchen... Take 1 tab by mouth at bedtime...  Problem # 8:  DEMENTIA (ICD-294.8)  Patient Instructions: 1)  Your physician recommends that you schedule a follow-up appointment in: ONE YEAR  2)  Your physician has requested that you have a carotid duplex. This test is an ultrasound of the carotid arteries in your neck. It looks at blood flow through these arteries that supply the brain with blood. Allow one hour for this exam. There are no restrictions or special instructions. 3)  Your physician has requested that you have an echocardiogram.  Echocardiography is a painless test that uses sound waves to create images of your heart. It provides your doctor with information about the size and shape of your heart and how well your heart's chambers and valves are working.  This procedure takes approximately one hour. There are no restrictions for this procedure.

## 2010-04-24 NOTE — Progress Notes (Signed)
Summary: discuss lab results---work on diet and exercise-f/u in March  Phone Note Call from Patient   Caller: Patient Call For: nadel Summary of Call: pt calling for cholesterol results. Initial call taken by: Rickard Patience,  December 19, 2009 10:46 AM  Follow-up for Phone Call        called and spoke with pt.  pt states she got lab results from 11-27-2009 via phone tree.  (per SN's append to lab results....Marland Kitchen"prob needs fibrate... for now needs better diet lose wt..."   Pt states she would like to continue on diet and exercise until her next follow up with SN in March and then have bloodwork rechecked to see how she is doing.  Pt stated she just wanted SN to be aware of what she wanted to do.  Aundra Millet Reynolds LPN  December 19, 2009 11:02 AM   Additional Follow-up for Phone Call Additional follow up Details #1::        per SN-----that sounds like a good plan and we will recheck her labs in march.  thanks Randell Loop CMA  December 19, 2009 1:39 PM     Additional Follow-up for Phone Call Additional follow up Details #2::    [facility] informing pt of SN"s response and to  keep scheduled f/u appt with SN May 26, 2010. Aundra Millet Reynolds LPN  December 19, 2009 2:14 PM

## 2010-04-24 NOTE — Progress Notes (Signed)
Summary: labs  Phone Note Call from Patient Call back at Home Phone [phone]   Caller: Patient Call For: nadel Reason for Call: Talk to Nurse Summary of Call: pt wants to come in Wed 03/02 for labs - has appt on 03/09 w/SN - Call pt and let her know labs are in. Initial call taken by: Eugene Gavia,  May 21, 2009 9:40 AM  Follow-up for Phone Call        Please advise if ok or not. and what labs need to be drawn along with dx codes.  Thank you.  Aundra Millet Reynolds LPN  May 22, 4694 9:44 AM    labs are in the computer for pt. called and spoke with pt and she is aware of labs. Randell Loop CMA  May 21, 2009 11:54 AM

## 2010-04-24 NOTE — Assessment & Plan Note (Signed)
Summary: 4 month return/mhh   CC:  4 month ROV & review of mult medical problems....  History of Present Illness: 77 y/o WF w/ mult med problems here for a follow up visit...    ~  Sep09:  she states that she is doing OK and this is confirmed by her husband who cares for her at home... she complains of excess urination and blames the Lasix and wants to decrease the dose (on 80mg Qam)... we checked her renal function (BUN= 17, Creat= 1.1) & BNP (353) & decided to keep everything the same... she notes that DrPlovsky has been adjusting her meds and she wants to get off them...    ~  Jan10:  states she's feeling great and more like herself... DrPlovsky has decreased her meds, stable & doing OK (confirmed by her husb)... weight stable 183# "I'm on a diet now"... we will f/u CXR and fasting labs today... OK H1N1 vaccine today.  ~  Apr10:  Hosp after fall at home w/ L1 compression fx & retropulsed fragment in canal- eval by DrElsner w/ rec for conservative management w/ brace, rest, rehab... tx to inpt rehab and DC'd home 07/09/08 followed by DrElsner, DrNorris, DrPlovsky... she reports improved w/ therapy & husb's good care- using Tramadol as needed pain, brace, walker, etc... DrPlovsky sees her every 3-4 mo & recently stopped her Depakote- now on Celexa alone.   ~  January 23, 2009:  here by herself today "so I could show you I could do it on my own"...she had f/u DrCrenshaw 9/10- BP sl incr & asked to monitor it at home, 2DEcho done & OK w/o progressive changes, but she stopped her Crestor5 ?why- asked to restart this med... had f/u DrElsner 8/10 for L1 compression- doing satis & "he released me"... had her flu shot last month.   ~  May 29, 2009:  once again she stopped her Crestor 5mg - FLP w/ [id number] & we discussed this- she wants PAVASTATIN 40mg /d OK... she states that "my mind is back where it should be" & "I don't have dementia"... states she's driving now- & I cautioned her and her husb about  this.    Problem List:  HEARING LOSS - ENT eval 9/10 by DrTeoh...  BRONCHITIS, RECURRENT (ICD-491.9) Hx of PNEUMONIA (ICD-486) - no recent symptoms, she is doing well...  HYPERTENSION (ICD-401.9) - on ASA 325mg /d, LOPRESSOR 50mg Bid, NORVASC 5mg /d, & LASIX 40mg - 2tabsQam... BP=152/86 & doing well... denies HA, visual changes, CP, palipit, dizziness, syncope, change in dyspnea, or incr edema...  CORONARY ARTERY DISEASE (ICD-414.00) - S/P CABG 2003 w/ vein graft to PDA... no CP, palpit, or change in dyspnea... she is still pretty sedentary... followed by Aletta Edouard for Cards...  ~   last NuclearStressTest 6/08 showed RBBB, no ischemia, EF=76%...  ~  labs 9/09 showed BNP= 353 on Lasix 80mg /d.  ~  labs 1/10 showed BNP= 204  ~  saw DrCrenshaw 9/10 w/ 2DEcho- mild LVH, EF=60%, wall motion norm, tissue AoV prosthesis- OK.  Hx of VALVULAR HEART DISEASE (ICD-424.90) - S/P tissue AVR & Thor AA repair 2003.  ~  she knows about the need for SBE prophylaxis.  ~  2DEcho 3/09 showed a bioprosthetic valve w/ 21mm gradient, & norm LVF w/ EF= 55-60%.  ~  2DEcho 9/10- mild LVH, EF=60%, wall motion norm, tissue AoV prosthesis- working well...  Hx of ATRIAL FIBRILLATION (ICD-427.31) - PAF prev on coumadin & stopped 2/09... she is holding NSR...  CEREBROVASCULAR ACCIDENT, HX OF (ICD-V12.50) - Eval  and Rx DrSethi...  ~  CDoppler 10/08 w/ tort ICA's, no plaque, normal carotids.  ~  CDoppler 4/10 at [facility] read by [facility] showed bilat 40-59% ICA stenoses believed to be from tortuosity of vessels.  HYPERLIPIDEMIA (ICD-272.4) - sher stopped the Crestor 5mg  again... she will agree to try Prav40.  ~  last FLP 1/08 showed TChol 128, TG 94, HDL 40, LDL 69...  ~  FLP 1/10 on diet showed TChol 247, TG 200, HDL 40, LDL 166... rec> start Crestor5.  ~  FLP 7/10 on Cres5 showed TChol 157, TG 202, HDL 42, LDL 85... rec> continue same.  ~  11/10: pt reports that she stopped the Crestor ?why- ?mild symptoms, advised to  restart Rx.  ~  FLP 3/11 on diet alone showed TChol 258, TG 359, HDL 49, LDL 140... start PRAV40.  GERD (ICD-530.81) - on OMEPRAZOLE 20mg /d Prn & controls her symptoms.  DIVERTICULOSIS OF COLON (ICD-562.10) & HEMORRHOIDS (ICD-455.6) - uses Miralax Prn...  ~  last colon 10/00 [facility] w/ divertics, hems, cecal AVM.  RENAL INSUFFICIENCY (ICD-588.9) & Hx of UTI (ICD-599.0)  ~  labs 3/09 showed BUN= 20, Creat= 1.4  ~  labs 9/09 showed BUN= 17, Creat= 1.1  ~  labs 7/10 showed BUN= 21, Creat= 1.4  ~  labs 3/11 showed BUN= 17, Creat= 1.2  DEGENERATIVE JOINT DISEASE (ICD-715.90) - s/p left THR 7/04 by DrAlusio.  ~  labs 7/10 showed Vit D level = 30... rec to start Vit D 1000 u daily OTC.  ~  labs 3/11 showed Vit D level = 26... rec incr Vit D to 2000 u daily.  BACK PAIN, LUMBAR (ICD-724.2) - Hosp 4/10 after fall at home w/ L1 compression fx & retropulsed fragment in canal- eval by DrElsner w/ rec for conservative management w/ brace, rest, rehab...  DEMENTIA (ICD-294.8) - Prev hx DELIRIUM w/ Abn MRI showing atrophy, sm vessel dis, old infarct... then hosp 12/08 w/ aud hallucinations & psych rx from DrWilliford w/ Risperdal, Depakote, Ativan... 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 apparently no better off coumadin, but they left her off it at disch to the nursing home)... she is now stable on a complex psychotopic regimen per [facility], Psychiatry... see med list...  ~  1/10:  now off Risperdal... on Depakote 250mg /d, Celexa 20mg - 1.5tabsQhs.  ~  6/10:  now off Depakote, on Celexa alone & followed by DrPlovsky.  ANXIETY (ICD-300.00)  ANEMIA (ICD-285.9)  ~  labs 3/09 showed Hg= 11.7  ~  labs 9/09 showed Hg 12.2  ~  labs 1/10 showed Hg= 12.8  ~  labs 7/10 showed Hg= 13.0  ~  labs 3/11 showed Hg=  12.8   Allergies: 1)  ! Pcn 2)  ! Sulfa 3)  ! Biaxin 4)  ! * Ivp Dye 5)  ! Prednisone 6)  ! * Statins 7)  ! * Cephtin  Comments:  Nurse/Medical Assistant: The patient's medications and allergies were reviewed with the patient and were updated in the Medication and Allergy Lists.  Past History:  Past Medical History:  HEARING LOSS (ICD-389.9) BRONCHITIS, RECURRENT (ICD-491.9) Hx of PNEUMONIA (ICD-486) HYPERTENSION (ICD-401.9) CORONARY ARTERY DISEASE (ICD-414.00) Hx of VALVULAR HEART DISEASE (ICD-424.90) Hx of ATRIAL FIBRILLATION (ICD-427.31) CEREBROVASCULAR ACCIDENT, HX OF (ICD-V12.50) HYPERLIPIDEMIA (ICD-272.4) GERD (ICD-530.81) DIVERTICULOSIS OF COLON (ICD-562.10) HEMORRHOIDS (ICD-455.6) RENAL INSUFFICIENCY (  ICD-588.9) Hx of UTI (ICD-599.0) DEGENERATIVE JOINT DISEASE (ICD-715.90) BACK PAIN, LUMBAR (ICD-724.2) OSTEOPOROSIS (ICD-733.00) DEMENTIA (ICD-294.8) ANXIETY (ICD-300.00) ANEMIA (ICD-285.9)  Past Surgical History:  HIP REPLACEMENT, LEFT, HX OF (ICD-V43.64) S/P HYSTERECTOMY INTRAOCULAR LENS IMPLANT, BILATERAL, HX OF (ICD-V43.1) CATARACT EXTRACTIONS, BILATERAL, HX OF (ICD-V45.61) CORONARY ARTERY BYPASS GRAFT, HX OF (ICD-V45.81) AORTIC VALVE REPLACEMENT, HX OF (ICD-V43.3)  Family History: Reviewed history from 09/19/2008 and no changes required. mother deceased father deceased pt was very young when parents died and is unsure of their age or reasons. 1 brother deceased age 30  hx of DM 1 sister alive age 17  hx of DM 1 brother deceaed age 65   hx of DM  Social History: Reviewed history from 09/19/2008 and no changes required. Retired  Married  Tobacco Use - Never Alcohol Use - no caffeine use---coffee daily  Review of Systems      See HPI       The patient complains of dyspnea on exertion and difficulty walking.  The patient denies anorexia, fever, weight loss, weight gain, vision loss, decreased hearing, hoarseness, chest pain, syncope,  peripheral edema, prolonged cough, headaches, hemoptysis, abdominal pain, melena, hematochezia, severe indigestion/heartburn, hematuria, incontinence, muscle weakness, suspicious skin lesions, transient blindness, depression, unusual weight change, abnormal bleeding, enlarged lymph nodes, and angioedema.    Vital Signs:  Patient profile:   77 year old female Height:      63 inches Weight:      189 pounds O2 Sat:      96 % on Room air Temp:     97.5 degrees F oral Pulse rate:   69 / minute BP sitting:   152 / 86  (right arm) Cuff size:   regular  Vitals Entered By: Randell Loop CMA (May 29, 2009 11:42 AM)  O2 Sat at Rest %:  96 O2 Flow:  Room air CC: 4 month ROV & review of mult medical problems... Is Patient Diabetic? No Pain Assessment Patient in pain? no      Comments meds updated today   Physical Exam  Additional Exam:  WD, sl overweight, 77 y/o WF in NAD... flattened affect apparent, without change... GENERAL:  Alert & oriented x3... HEENT:  [place]/AT, EOM-wnl, PERRLA, EACs-clear, TMs-wnl, NOSE-clear, THROAT- sl dry, clear & wnl. NECK:  Supple w/ fairROM; no JVD; normal carotid impulses w/o bruits; no thyromegaly or nodules palpated; no lymphadenopathy. CHEST:  Clear to P & A; without wheezes/ rales/ or rhonchi heard... HEART: s/p med sternotomy, regular,  gr1/6 SEM without rubs or gallops apprec... ABDOMEN:  Soft & nontender; normal bowel sounds; no organomegaly or masses detected. EXT: without deformities, s/p left THR, mod arthritic changes; no varicose veins/ +venous insuffic/ tr edema is noted... NEURO:  CN's intact;  back brace, L1 compression, no focal neuro changes... DERM:  No lesions noted; no rash etc...    MISC. Report  Procedure date:  05/22/2009  Findings:      Lipid Panel (LIPID)   Cholesterol          [H]  258 mg/dL                   0-454   Triglycerides        [H]  359.0 mg/dL                 0.9-811.9   HDL                       14.78  mg/dL                  >16.10   Cholesterol LDL - Direct                             139.9 mg/dL  BMP (METABOL)   Sodium                    141 mEq/L                   135-145   Potassium                 3.6 mEq/L                   3.5-5.1   Chloride                  100 mEq/L                   96-112   Carbon Dioxide            30 mEq/L                    19-32   Glucose                   91 mg/dL                    96-04   BUN                       17 mg/dL                    5-40   Creatinine                1.2 mg/dL                   9.8-1.1   Calcium                   8.8 mg/dL                   9.1-47.8   GFR                       45.54 mL/min                >60  Hepatic/Liver Function Panel (HEPATIC)   Total Bilirubin           0.6 mg/dL                   2.9-5.6   Direct Bilirubin          0.1 mg/dL                   2.1-3.0   Alkaline Phosphatase      104 U/L                     39-117   AST                       20 U/L                      0-37   ALT  15 U/L                      0-35   Total Protein             6.7 g/dL                    7.8-2.9   Albumin                   3.6 g/dL                    5.6-2.1  Comments:      CBC Platelet w/Diff (CBCD)   White Cell Count          4.5 K/uL                    4.5-10.5   Red Cell Count            4.36 Mil/uL                 3.87-5.11   Hemoglobin                12.8 g/dL                   30.8-65.7   Hematocrit                38.7 %                      36.0-46.0   MCV                       88.7 fl                     78.0-100.0   Platelet Count            221.0 K/uL                  150.0-400.0   Neutrophil %              71.2 %                      43.0-77.0   Lymphocyte %              17.0 %                      12.0-46.0   Monocyte %                8.3 %                       3.0-12.0   Eosinophils%              2.7 %                       0.0-5.0   Basophils %               0.8 %                        0.0-3.0  TSH (TSH)   FastTSH                   3.16 uIU/mL  0.35-5.50   Impression & Recommendations:  Problem # 1:  BRONCHITIS, RECURRENT (ICD-491.9) No recent symptoms-  doing well...  Problem # 2:  HYPERTENSION (ICD-401.9) BP is marginal-  advised no salt, get wt down, take meds regularly... Her updated medication list for this problem includes:    Lopressor 50 Mg Tabs (Metoprolol tartrate) .Marland Kitchen... Take one tablet by mouth two times a day    Norvasc 5 Mg Tabs (Amlodipine besylate) .Marland Kitchen... Take 1 tablet by mouth once a day    Furosemide 40 Mg Tabs (Furosemide) .Marland Kitchen... Take 2 tabs by mouth once daily in am...  Problem # 3:  CORONARY ARTERY DISEASE (ICD-414.00) Followed by Aletta Edouard-  no angina, continue current meds... Her updated medication list for this problem includes:    Ecotrin 325 Mg Tbec (Aspirin) .Marland Kitchen... Take 1 tablet by mouth once a day    Lopressor 50 Mg Tabs (Metoprolol tartrate) .Marland Kitchen... Take one tablet by mouth two times a day    Norvasc 5 Mg Tabs (Amlodipine besylate) .Marland Kitchen... Take 1 tablet by mouth once a day    Furosemide 40 Mg Tabs (Furosemide) .Marland Kitchen... Take 2 tabs by mouth once daily in am...  Problem # 4:  Hx of VALVULAR HEART DISEASE (ICD-424.90) She is s/p AVR w/ tissue valve & ThorAA repaired in 2003... followed by Aletta Edouard for Cards... Her updated medication list for this problem includes:    Ecotrin 325 Mg Tbec (Aspirin) .Marland Kitchen... Take 1 tablet by mouth once a day    Lopressor 50 Mg Tabs (Metoprolol tartrate) .Marland Kitchen... Take one tablet by mouth two times a day  Problem # 5:  Hx of ATRIAL FIBRILLATION (ICD-427.31) She is holding NSR... Her updated medication list for this problem includes:    Ecotrin 325 Mg Tbec (Aspirin) .Marland Kitchen... Take 1 tablet by mouth once a day    Lopressor 50 Mg Tabs (Metoprolol tartrate) .Marland Kitchen... Take one tablet by mouth two times a day    Norvasc 5 Mg Tabs (Amlodipine besylate) .Marland Kitchen... Take 1 tablet by mouth once a day  Problem # 6:   HYPERLIPIDEMIA (ICD-272.4) She refuses to take the Cres5... FLP on diet alone> she agrees to take Pravachol 40mg /d... The following medications were removed from the medication list:    Crestor 5 Mg Tabs (Rosuvastatin calcium) .Marland Kitchen... 1 by mouth daily Her updated medication list for this problem includes:    Pravastatin Sodium 40 Mg Tabs (Pravastatin sodium) .Marland Kitchen... Take 1 tab by mouth at bedtime...  Problem # 7:  GI & RENAL>>> GI & Renal are stable...  Problem # 8:  DEGENERATIVE JOINT DISEASE (ICD-715.90) Mod DJD controlled on meds... Her updated medication list for this problem includes:    Ecotrin 325 Mg Tbec (Aspirin) .Marland Kitchen... Take 1 tablet by mouth once a day    Tramadol Hcl 50 Mg Tabs (Tramadol hcl) .Marland Kitchen... Take 1 tab by mouth up to three times a day as needed for pain...  Problem # 9:  DEMENTIA (ICD-294.8) She has known cerebrovasc dis, atrophy etc... affect is off & she denies having dementia...  Problem # 10:  OTHERMEDICAL PROBLEMS AS NOTED>>>  Complete Medication List: 1)  Ecotrin 325 Mg Tbec (Aspirin) .... Take 1 tablet by mouth once a day 2)  Lopressor 50 Mg Tabs (Metoprolol tartrate) .... Take one tablet by mouth two times a day 3)  Norvasc 5 Mg Tabs (Amlodipine besylate) .... Take 1 tablet by mouth once a day 4)  Furosemide 40 Mg Tabs (Furosemide) .... Take 2 tabs by mouth once  daily in am... 5)  Omeprazole 20 Mg Cpdr (Omeprazole) .... Take 1 tab by mouth as needed... 6)  Senna-plus 8.6-50 Mg Tabs (Sennosides-docusate sodium) .... Take 2 tablets by mouth daily 7)  Miralax Powd (Polyethylene glycol 3350) .... As needed 8)  Tramadol Hcl 50 Mg Tabs (Tramadol hcl) .... Take 1 tab by mouth up to three times a day as needed for pain.Marland KitchenMarland Kitchen 9)  Ra Calcium 500 Mg Tabs (Calcium) .... Take 3 tablets by mouth once daily 10)  Vitamin D 1000 Unit Tabs (Cholecalciferol) .... Take 2 tablets by mouth once a day 11)  Folic Acid 1 Mg Tabs (Folic acid) .... Take 1 tablet by mouth once a day 12)   Celexa 20 Mg Tabs (Citalopram hydrobromide) .... Give 1 and 1/2 tab by mouth at bedtime as directed by drplovsky... 13)  Pravastatin Sodium 40 Mg Tabs (Pravastatin sodium) .... Take 1 tab by mouth at bedtime...  Other Orders: Prescription Created Electronically [id number])  Patient Instructions: 1)  Today we updated your med list- see below.... 2)  We decided to try the PRAVASTATIN - take 1 tab at bedtime for your Cholesterol... 3)  Increase your Vit D to 2000 u daily.Marland KitchenMarland Kitchen 4)  We are pleased that you are doing so well- keep up the good work... 5)  Call for any problems.Marland KitchenMarland Kitchen 6)  Please schedule a follow-up appointment in 6 months, sooner as needed... Prescriptions: PRAVASTATIN SODIUM 40 MG TABS (PRAVASTATIN SODIUM) take 1 tab by mouth at bedtime...  #30 x prn   Entered and Authorized by:   Michele Mcalpine MD   Signed by:   Michele Mcalpine MD on 05/29/2009   Method used:   Print then Give to Patient   RxID:   [id number]

## 2010-04-24 NOTE — Assessment & Plan Note (Signed)
Summary: 53m reck/klw   CC:  6 month ROV & review of mult medical problems....  History of Present Illness: 77 y/o WF w/ mult med problems here for a follow up visit...    ~  Apr10:  Hosp after fall at home w/ L1 compression fx & retropulsed fragment in canal- eval by DrElsner w/ rec for conservative management w/ brace, rest, rehab... tx to inpt rehab and DC'd home 07/09/08 followed by DrElsner, DrNorris, DrPlovsky... she reports improved w/ therapy & husb's good care- using Tramadol as needed pain, brace, walker, etc... DrPlovsky sees her every 3-4 mo & recently stopped her Depakote- now on Celexa alone.  ~  Nov10:  here by herself today "so I could show you I could do it on my own"...she had f/u DrCrenshaw 9/10- BP sl incr & asked to monitor it at home, 2DEcho done & OK w/o progressive changes, but she stopped her Crestor5 ?why- asked to restart this med... had f/u DrElsner 8/10 for L1 compression- doing satis & "he released me"... had her flu shot last month.   ~  May 29, 2009:  once again she stopped her Crestor 5mg - FLP w/ TChol 258 & we discussed this- she wants PAVASTATIN 40mg /d >OK... she states that "my mind is back where it should be" & "I don't have dementia"... states she's driving now- & I cautioned her and her husb about this.   ~  June 18, 2009:  Add-on for follow up of recent AB exac> developed URI/ bronchitic exac & had ZPak called in but no better & came to see TP 3/21 w/ chest congestion, cough, thick mucus, wheezing documented... changed to Avelox, given Dosepak, Mucinex, Fluids... improved but stopped the dosepak "it swells me up" & ?rectal bleeding... now almost back to baseline, requests cough syrup...   ~  November 26, 2009:  6 mo ROV- "I feel good" no new complaints or concerns... BP controlled on meds belwo;  denies CP, palpit, SOB, etc;  remains on Coumadin via CC;  on Prav40 for Chol & tol well- FLP looks pretty good & continue same;  DrPlovsky has her on 1.5 Celexa tabs  daily & doing well she says... due for Fasting labs & Flu shot today.   Problem List:  HEARING LOSS - ENT eval 9/10 by DrTeoh...  BRONCHITIS, RECURRENT (ICD-491.9) - ** see above ** Hx of PNEUMONIA (ICD-486)  HYPERTENSION (ICD-401.9) - on ASA 325mg /d, LOPRESSOR 50mg Bid, NORVASC 5mg /d, & LASIX 40mg - 2tabsQam... BP=138/64 & doing well... denies HA, visual changes, CP, palipit, dizziness, syncope, change in dyspnea, or incr edema...  CORONARY ARTERY DISEASE (ICD-414.00) - S/P CABG 2003 w/ vein graft to PDA... no CP, palpit, or change in dyspnea... she is still pretty sedentary... followed by Aletta Edouard for Cards...  ~   last NuclearStressTest 6/08 showed RBBB, no ischemia, EF=76%...  ~  labs 9/09 showed BNP= 353 on Lasix 80mg /d.  ~  labs 1/10 showed BNP= 204  ~  saw DrCrenshaw 9/10 w/ 2DEcho- mild LVH, EF=60%, wall motion norm, tissue AoV prosthesis- OK.  ~  9/11: she has f/u w/ Cards next week...  Hx of VALVULAR HEART DISEASE (ICD-424.90) - S/P tissue AVR & Thor AA repair 2003.  ~  she knows about the need for SBE prophylaxis.  ~  2DEcho 3/09 showed a bioprosthetic valve w/ 21mm gradient, & norm LVF w/ EF= 55-60%.  ~  2DEcho 9/10- mild LVH, EF=60%, wall motion norm, tissue AoV prosthesis- working well...  Hx of ATRIAL  FIBRILLATION (ICD-427.31) - PAF prev on coumadin & stopped 2/09... she is holding NSR...  CEREBROVASCULAR ACCIDENT, HX OF (ICD-V12.50) - Eval and Rx DrSethi...  ~  CDoppler 10/08 w/ tort ICA's, no plaque, normal carotids.  ~  CDoppler 4/10 at [facility] read by [facility] showed bilat 40-59% ICA stenoses believed to be from tortuosity of vessels.  HYPERLIPIDEMIA (ICD-272.4) - sher stopped the Crestor 5mg  again... she will agree to try Prav40.  ~  last FLP 1/08 showed TChol 128, TG 94, HDL 40, LDL 69...  ~  FLP 1/10 on diet showed TChol 247, TG 200, HDL 40, LDL 166... rec> start Crestor5.  ~  FLP 7/10 on Cres5 showed TChol 157, TG 202, HDL 42, LDL 85... rec> continue same.  ~   11/10: pt reports that she stopped the Crestor ?why- ?mild symptoms, advised to restart Rx.  ~  FLP 3/11 on diet alone showed TChol 258, TG 359, HDL 49, LDL 140... start PRAV40.  ~  FLP 9/11 on Prav40 showed TChol 210, TG 364, HDL 41, LDL 103... needs better low fat diet.  GERD (ICD-530.81) - on OMEPRAZOLE 20mg /d Prn & controls her symptoms.  DIVERTICULOSIS OF COLON (ICD-562.10) & HEMORRHOIDS (ICD-455.6) - uses Miralax Prn...  ~  last colon 10/00 [facility] w/ divertics, hems, cecal AVM.  RENAL INSUFFICIENCY (ICD-588.9) & Hx of UTI (ICD-599.0)  ~  labs 3/09 showed BUN= 20, Creat= 1.4  ~  labs 9/09 showed BUN= 17, Creat= 1.1  ~  labs 7/10 showed BUN= 21, Creat= 1.4  ~  labs 3/11 showed BUN= 17, Creat= 1.2  ~  labs 9/11 showed BUN= 22, Creat= 1.3  DEGENERATIVE JOINT DISEASE (ICD-715.90) - s/p left THR 7/04 by DrAlusio.  ~  labs 7/10 showed Vit D level = 30... rec to start Vit D 1000 u daily OTC.  ~  labs 3/11 showed Vit D level = 26... rec incr Vit D to 2000 u daily.  BACK PAIN, LUMBAR (ICD-724.2) - Hosp 4/10 after fall at home w/ L1 compression fx & retropulsed fragment in canal- eval by DrElsner w/ rec for conservative management w/ brace, rest, rehab...  DEMENTIA (ICD-294.8) - Prev hx DELIRIUM w/ Abn MRI showing atrophy, sm vessel dis, old infarct... then hosp 12/08 w/ aud hallucinations & psych rx from DrWilliford w/ Risperdal, Depakote, Ativan... 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 apparently no better off coumadin, but they left her off it at disch to the nursing home)... she is now stable on a complex psychotopic regimen per [facility], Psychiatry... see med list...  ~  1/10:  now off Risperdal... on Depakote 250mg /d, Celexa 20mg - 1.5tabsQhs.  ~  6/10:  now off Depakote, on Celexa alone &  followed by DrPlovsky.  ANXIETY (ICD-300.00)  ANEMIA (ICD-285.9)  ~  labs 3/09 showed Hg= 11.7  ~  labs 9/09 showed Hg 12.2  ~  labs 1/10 showed Hg= 12.8  ~  labs 7/10 showed Hg= 13.0  ~  labs 3/11 showed Hg= 12.8   Preventive Screening-Counseling & Management  Alcohol-Tobacco     Smoking Status: never  Allergies: 1)  ! Pcn 2)  ! Sulfa 3)  ! Biaxin 4)  ! * Ivp Dye 5)  ! Prednisone 6)  ! * Statins 7)  ! * Cephtin  Comments:  Nurse/Medical Assistant: The patient's medications and  allergies were reviewed with the patient and were updated in the Medication and Allergy Lists.  Past History:  Past Medical History: HEARING LOSS (ICD-389.9) BRONCHITIS, RECURRENT (ICD-491.9) Hx of PNEUMONIA (ICD-486) HYPERTENSION (ICD-401.9) CORONARY ARTERY DISEASE (ICD-414.00) CORONARY ARTERY BYPASS GRAFT, HX OF (ICD-V45.81) Hx of VALVULAR HEART DISEASE (ICD-424.90) AORTIC VALVE REPLACEMENT, HX OF (ICD-V43.3) Hx of ATRIAL FIBRILLATION (ICD-427.31) CEREBROVASCULAR ACCIDENT, HX OF (ICD-V12.50) CAROTID STENOSIS (ICD-433.10) HYPERLIPIDEMIA (ICD-272.4) GERD (ICD-530.81) DIVERTICULOSIS OF COLON (ICD-562.10) HEMORRHOIDS (ICD-455.6) RENAL INSUFFICIENCY (ICD-588.9) Hx of UTI (ICD-599.0) DEGENERATIVE JOINT DISEASE (ICD-715.90) BACK PAIN, LUMBAR (ICD-724.2) OSTEOPOROSIS (ICD-733.00) DEMENTIA (ICD-294.8) ANXIETY (ICD-300.00) ANEMIA (ICD-285.9)  Past Surgical History: HIP REPLACEMENT, LEFT, HX OF (ICD-V43.64) S/P HYSTERECTOMY INTRAOCULAR LENS IMPLANT, BILATERAL, HX OF (ICD-V43.1) CATARACT EXTRACTIONS, BILATERAL, HX OF (ICD-V45.61) CORONARY ARTERY BYPASS GRAFT, HX OF (ICD-V45.81) AORTIC VALVE REPLACEMENT, HX OF (ICD-V43.3)  Family History: Reviewed history from 09/19/2008 and no changes required. mother deceased father deceased pt was very young when parents died and is unsure of their age or reasons. 1 brother deceased age 24  hx of DM 1 sister alive age 48  hx of DM 1 brother  deceaed age 46   hx of DM  Social History: Reviewed history from 09/19/2008 and no changes required. Retired  Married  Tobacco Use - Never Alcohol Use - no caffeine use---coffee daily  Review of Systems      See HPI       The patient complains of dyspnea on exertion, peripheral edema, and muscle weakness.  The patient denies anorexia, fever, weight loss, weight gain, vision loss, decreased hearing, hoarseness, chest pain, syncope, prolonged cough, headaches, hemoptysis, abdominal pain, melena, hematochezia, severe indigestion/heartburn, hematuria, incontinence, suspicious skin lesions, transient blindness, difficulty walking, depression, unusual weight change, abnormal bleeding, enlarged lymph nodes, and angioedema.    Vital Signs:  Patient profile:   77 year old female Height:      63 inches Weight:      176.38 pounds BMI:     31.36 O2 Sat:      97 % on room air Temp:     97.0 degrees F oral Pulse rate:   68 / minute BP sitting:   138 / 64  (right arm) Cuff size:   regular  Vitals Entered By: Randell Loop CMA (November 26, 2009 11:07 AM)  O2 Sat at Rest %:  97 O2 Flow:  room air CC: 6 month ROV & review of mult medical problems... Is Patient Diabetic? No Pain Assessment Patient in pain? no      Comments MEDS UPDATED TODAY WITH PT   Physical Exam  Additional Exam:  WD, sl overweight, 77 y/o WF in NAD... flattened affect apparent, without change... GENERAL:  Alert & oriented x3... HEENT:  [employer]/AT, EOM-wnl, PERRLA, EACs-clear, TMs-wnl, NOSE-clear, THROAT- sl dry, clear & wnl. NECK:  Supple w/ fairROM; no JVD; normal carotid impulses w/o bruits; no thyromegaly or nodules palpated; no lymphadenopathy. CHEST:  Clear to P & A; without wheezes/ rales/ or rhonchi heard... mild congested cough... HEART: s/p med sternotomy, regular,  gr1/6 SEM without rubs or gallops apprec... ABDOMEN:  Soft & nontender; normal bowel sounds; no organomegaly or masses detected. EXT: without  deformities, s/p left THR, mod arthritic changes; no varicose veins/ +venous insuffic/ tr edema is noted... NEURO:  CN's intact;  back brace, L1 compression, no focal neuro changes... DERM:  No lesions noted; no rash etc...    MISC. Report  Procedure date:  11/27/2009  Findings:      BMP (METABOL)  Sodium                    142 mEq/L                   135-145   Potassium                 4.2 mEq/L                   3.5-5.1   Chloride                  103 mEq/L                   96-112   Carbon Dioxide            29 mEq/L                    19-32   Glucose                   79 mg/dL                    04-54   BUN                       22 mg/dL                    0-98   Creatinine           [H]  1.3 mg/dL                   1.1-9.1   Calcium                   9.0 mg/dL                   4.7-82.9   GFR                       41.84 mL/min                >60  Lipid Panel (LIPID)   Cholesterol          [H]  210 mg/dL                   5-621   Triglycerides        [H]  364.0 mg/dL                 3.0-865.7   HDL                       84.69 mg/dL                 >62.95 Cholesterol LDL - Direct                             102.7 mg/dL  Hepatic/Liver Function Panel (HEPATIC)   Total Bilirubin           0.5 mg/dL                   2.8-4.1   Direct Bilirubin          0.1 mg/dL                   3.2-4.4   Alkaline Phosphatase  92 U/L                      39-117   AST                       21 U/L                      0-37   ALT                       14 U/L                      0-35   Total Protein             6.5 g/dL                    0.9-3.8   Albumin                   3.8 g/dL                    1.8-2.9   Impression & Recommendations:  Problem # 1:  HYPERTENSION (ICD-401.9) Controlled>  same meds. Her updated medication list for this problem includes:    Lopressor 50 Mg Tabs (Metoprolol tartrate) .Marland Kitchen... Take one tablet by mouth two times a day    Norvasc 5 Mg Tabs (Amlodipine  besylate) .Marland Kitchen... Take 1 tablet by mouth once a day    Furosemide 40 Mg Tabs (Furosemide) .Marland Kitchen... Take 2 tabs by mouth once daily in am...  Problem # 2:  CORONARY ARTERY DISEASE (ICD-414.00) S/P CABG> doing satis on meds w/o angina etc... Her updated medication list for this problem includes:    Ecotrin 325 Mg Tbec (Aspirin) .Marland Kitchen... Take 1 tablet by mouth once a day    Lopressor 50 Mg Tabs (Metoprolol tartrate) .Marland Kitchen... Take one tablet by mouth two times a day    Norvasc 5 Mg Tabs (Amlodipine besylate) .Marland Kitchen... Take 1 tablet by mouth once a day    Furosemide 40 Mg Tabs (Furosemide) .Marland Kitchen... Take 2 tabs by mouth once daily in am...  Problem # 3:  Hx of VALVULAR HEART DISEASE (ICD-424.90) S/P Thor AA repair & AVR- she has f/u DrCrenshaw soon... Her updated medication list for this problem includes:    Ecotrin 325 Mg Tbec (Aspirin) .Marland Kitchen... Take 1 tablet by mouth once a day    Lopressor 50 Mg Tabs (Metoprolol tartrate) .Marland Kitchen... Take one tablet by mouth two times a day  Problem # 4:  CEREBROVASCULAR ACCIDENT, HX OF (ICD-V12.50) CDopplers per DrBarbham...  Problem # 5:  HYPERLIPIDEMIA (ICD-272.4) Chol is improved but TG still elev- needs better low fat diet or add fibrate Rx... Her updated medication list for this problem includes:    Pravastatin Sodium 40 Mg Tabs (Pravastatin sodium) .Marland Kitchen... Take 1 tab by mouth at bedtime...  Problem # 6:  RENAL INSUFFICIENCY (ICD-588.9) Renal function stable...  Problem # 7:  DEGENERATIVE JOINT DISEASE (ICD-715.90) She is stable>  continue Rx & PT/ exercise... The following medications were removed from the medication list:    Tramadol Hcl 50 Mg Tabs (Tramadol hcl) .Marland Kitchen... Take 1 tab by mouth up to three times a day as needed for pain... Her updated medication list for this problem includes:    Ecotrin 325 Mg Tbec (Aspirin) .Marland Kitchen... Take 1 tablet by mouth once a day  Problem #  8:  ANXIETY (ICD-300.00) Followed by DrPlovsky... Her updated medication list for this problem  includes:    Celexa 20 Mg Tabs (Citalopram hydrobromide) .Marland Kitchen... Give 1 and 1/2 tab by mouth at bedtime as directed by drplovsky...  Complete Medication List: 1)  Ecotrin 325 Mg Tbec (Aspirin) .... Take 1 tablet by mouth once a day 2)  Lopressor 50 Mg Tabs (Metoprolol tartrate) .... Take one tablet by mouth two times a day 3)  Norvasc 5 Mg Tabs (Amlodipine besylate) .... Take 1 tablet by mouth once a day 4)  Furosemide 40 Mg Tabs (Furosemide) .... Take 2 tabs by mouth once daily in am... 5)  Pravastatin Sodium 40 Mg Tabs (Pravastatin sodium) .... Take 1 tab by mouth at bedtime.Marland KitchenMarland Kitchen 6)  Omeprazole 20 Mg Cpdr (Omeprazole) .... Take 1 tab by mouth as needed... 7)  Miralax Powd (Polyethylene glycol 3350) .... As needed 8)  Senna-plus 8.6-50 Mg Tabs (Sennosides-docusate sodium) .... Take 2 tablets by mouth daily 9)  Ra Calcium 500 Mg Tabs (Calcium) .... Take 3 tablets by mouth once daily 10)  Vitamin D 1000 Unit Tabs (Cholecalciferol) .... Take 2 tablets by mouth once a day 11)  Folic Acid 1 Mg Tabs (Folic acid) .... Take 1 tablet by mouth once a day 12)  Celexa 20 Mg Tabs (Citalopram hydrobromide) .... Give 1 and 1/2 tab by mouth at bedtime as directed by drplovsky...  Other Orders: Flu Vaccine 74yrs + MEDICARE PATIENTS (Z6109) Administration Flu vaccine - MCR (U0454)  Patient Instructions: 1)  Today we updated your med list- see below.... 2)  Continue your current meds the same... 3)  We gave you the 2011 Flu vaccine today... 4)  Please return to our lab one morning this week for your follow up FASTING blood work... please call the "phone tree" in a few days for your lab results.Marland KitchenMarland Kitchen 5)  Stay as active as poss... 6)  Please schedule a follow-up appointment in 6 months.               Flu Vaccine Consent Questions     Do you have a history of severe allergic reactions to this vaccine? no    Any prior history of allergic reactions to egg and/or gelatin? no    Do you have a sensitivity  to the preservative Thimersol? no    Do you have a past history of Guillan-Barre Syndrome? no    Do you currently have an acute febrile illness? no    Have you ever had a severe reaction to latex? no    Vaccine information given and explained to patient? yes    Are you currently pregnant? no    Lot Number:[id number]   Exp Date:09/20/2010   Site Given  Left Deltoid IMedflu  Boone Master CNA/MA  November 26, 2009 11:47 AM

## 2010-04-24 NOTE — Progress Notes (Signed)
Summary: sick  Phone Note Call from Patient Call back at [facility] Phone [phone]   Caller: Patient Call For: nadel Reason for Call: Talk to Nurse Summary of Call: fever, cough, drainage, cough up some plegm, feels it's going down into her lungs.  [facility] Initial call taken by: Eugene Gavia,  June 06, 2009 4:35 PM  Follow-up for Phone Call        low grade temp, cough-non prod, head congestion would like z-pak called to pharmacy  Philipp Deputy CMA  June 06, 2009 4:52 PM   per sn ok for z-pak, sent to pharmacy and pt aware Follow-up by: Philipp Deputy CMA,  June 06, 2009 4:57 PM    New/Updated Medications: ZITHROMAX Z-PAK 250 MG TABS (AZITHROMYCIN) take as directed Prescriptions: ZITHROMAX Z-PAK 250 MG TABS (AZITHROMYCIN) take as directed  #1 x 0   Entered by:   Philipp Deputy CMA   Authorized by:   Michele Mcalpine MD   Signed by:   Philipp Deputy CMA on 06/06/2009   Method used:   [facility] to        [facility] [address] [phone]* (retail)       [address]       [place], [place]  [address]       Ph: [phone] or [phone]       Fax: [phone]   RxID:   [id number]

## 2010-04-24 NOTE — Progress Notes (Signed)
Summary: talk to nurse  Phone Note Call from Patient Call back at Home Phone [phone]   Caller: Patient Call For: nadel Reason for Call: Talk to Nurse Summary of Call: Saw TP on 3/21, was given rx for avelox, thinks she's having side effects from this med, such as "stomach bleed" also she had a bm yesterday she passed a lot of bright red blood, please advise. Initial call taken by: Darletta Moll,  June 12, 2009 8:40 AM  Follow-up for Phone Call        called and spoke with pts spouse---he stated that pt saw TP on 3-21 and was given medrol pak and avelox---she stated that she got up this am and had some blood in her stools---he stated that she has been treating herself for hemrrhoids---pt is requesting a zpak or something different than the medrol.  please advise Randell Loop [facility]  June 12, 2009 9:26 AM   Additional Follow-up for Phone Call Additional follow up Details #1::        spoke w/ pt /husband, blood in stool, also has hemorrhoids, no abd pain, nv/d. NO blood today.  Will stop medrol, cont on avelox.  increase prilosec , if any more blood in stools needs ov asap;. will make ov 1 week,  Please contact office for sooner follow up if symptoms do not improve or worsen  Additional Follow-up by: Rubye Oaks NP,  June 12, 2009 10:45 AM    Additional Follow-up for Phone Call Additional follow up Details #2::    PT HAS APPT NEXT TUESDAY WITH SN AT 12.  PT IS AWARE Randell Loop [facility]  June 12, 2009 3:41 PM

## 2010-05-26 ENCOUNTER — Ambulatory Visit (INDEPENDENT_AMBULATORY_CARE_PROVIDER_SITE_OTHER): Payer: Medicare Other | Admitting: Pulmonary Disease

## 2010-05-26 ENCOUNTER — Encounter: Payer: Self-pay | Admitting: Pulmonary Disease

## 2010-05-26 DIAGNOSIS — Z8679 Personal history of other diseases of the circulatory system: Secondary | ICD-10-CM

## 2010-05-26 DIAGNOSIS — I4891 Unspecified atrial fibrillation: Secondary | ICD-10-CM

## 2010-05-26 DIAGNOSIS — K5730 Diverticulosis of large intestine without perforation or abscess without bleeding: Secondary | ICD-10-CM

## 2010-05-26 DIAGNOSIS — E785 Hyperlipidemia, unspecified: Secondary | ICD-10-CM

## 2010-05-26 DIAGNOSIS — I2510 Atherosclerotic heart disease of native coronary artery without angina pectoris: Secondary | ICD-10-CM

## 2010-05-26 DIAGNOSIS — I10 Essential (primary) hypertension: Secondary | ICD-10-CM

## 2010-05-26 DIAGNOSIS — K219 Gastro-esophageal reflux disease without esophagitis: Secondary | ICD-10-CM

## 2010-05-26 DIAGNOSIS — Z954 Presence of other heart-valve replacement: Secondary | ICD-10-CM

## 2010-05-27 ENCOUNTER — Encounter: Payer: Self-pay | Admitting: Pulmonary Disease

## 2010-05-27 ENCOUNTER — Other Ambulatory Visit: Payer: Self-pay | Admitting: Pulmonary Disease

## 2010-05-27 ENCOUNTER — Encounter (INDEPENDENT_AMBULATORY_CARE_PROVIDER_SITE_OTHER): Payer: Self-pay | Admitting: *Deleted

## 2010-05-27 ENCOUNTER — Other Ambulatory Visit: Payer: Medicare Other

## 2010-05-27 DIAGNOSIS — E7800 Pure hypercholesterolemia, unspecified: Secondary | ICD-10-CM

## 2010-05-27 DIAGNOSIS — R748 Abnormal levels of other serum enzymes: Secondary | ICD-10-CM

## 2010-05-27 DIAGNOSIS — D649 Anemia, unspecified: Secondary | ICD-10-CM

## 2010-05-27 DIAGNOSIS — I10 Essential (primary) hypertension: Secondary | ICD-10-CM

## 2010-05-27 DIAGNOSIS — E039 Hypothyroidism, unspecified: Secondary | ICD-10-CM

## 2010-05-27 LAB — HEPATIC FUNCTION PANEL
ALT: 10 U/L (ref 0–35)
Alkaline Phosphatase: 97 U/L (ref 39–117)
Bilirubin, Direct: 0.1 mg/dL (ref 0.0–0.3)
Total Bilirubin: 0.4 mg/dL (ref 0.3–1.2)

## 2010-05-27 LAB — BASIC METABOLIC PANEL
Chloride: 106 mEq/L (ref 96–112)
Creatinine, Ser: 1.5 mg/dL — ABNORMAL HIGH (ref 0.4–1.2)
Potassium: 4 mEq/L (ref 3.5–5.1)
Sodium: 142 mEq/L (ref 135–145)

## 2010-05-27 LAB — CBC WITH DIFFERENTIAL/PLATELET
Basophils Relative: 0.5 % (ref 0.0–3.0)
Eosinophils Absolute: 0.2 10*3/uL (ref 0.0–0.7)
MCHC: 33.5 g/dL (ref 30.0–36.0)
MCV: 89.3 fl (ref 78.0–100.0)
Monocytes Absolute: 0.6 10*3/uL (ref 0.1–1.0)
Neutro Abs: 4.8 10*3/uL (ref 1.4–7.7)
Neutrophils Relative %: 71.5 % (ref 43.0–77.0)
RBC: 4.28 Mil/uL (ref 3.87–5.11)
RDW: 16.2 % — ABNORMAL HIGH (ref 11.5–14.6)

## 2010-05-27 LAB — LIPID PANEL
LDL Cholesterol: 108 mg/dL — ABNORMAL HIGH (ref 0–99)
Total CHOL/HDL Ratio: 4

## 2010-06-01 LAB — CONVERTED CEMR LAB: Vit D, 25-Hydroxy: 71 ng/mL (ref 30–89)

## 2010-06-10 NOTE — Assessment & Plan Note (Signed)
Summary: 6 month follow up   CC:  6 month ROV & review of mult medical problems....  History of Present Illness: 77 y/o WF w/ mult med problems here for a follow up visit...      ~  June 18, 2009:      Add-on for follow up of recent AB exac> developed URI/ bronchitic exac & had ZPak called in but no better & came to see TP 3/21 w/ chest congestion, cough, thick mucus, wheezing documented... changed to Avelox, given Dosepak, Mucinex, Fluids... improved but stopped the dosepak "it swells me up" & ?rectal bleeding... now almost back to baseline, requests cough syrup...    Once again she stopped her Crestor 5mg - FLP w/ TChol 258 & we discussed this- she wants PAVASTATIN 40mg /d >OK...     She states that "my mind is back where it should be" & "I don't have dementia";  states she's driving now & I cautioned her and her husb about this.   ~  November 26, 2009:      6 mo ROV- "I feel good" no new complaints or concerns... BP controlled on meds below;  denies CP, palpit, SOB, etc;  on Prav40 for Chol & tol well- FLP looks pretty good & continue same;  DrPlovsky has her on 1.5 Celexa tabs daily & doing well she says... due for Fasting labs & Flu shot today.   ~  May 26, 2010:      576mo ROV c/o right hip pain & she has an upcoming appt w/ DrAlusio;  s/p left THR in 2004;  she doesn't want anything for pain- just a handicap application for her car- OK...    She saw DrCrenshaw 9/11 for f/u HBP, CAD s/p CABG & AVR w/ thor aneurysm repair, & hx PAF (she follows SBE prophylaxis protocols, but not a coumadin candidate due to falls)> stable, no changes made;  2DEcho 9/11 showed mild LVH, normal wall motion w/ EF=60-65%, AoV prosthesis looks good, RV mild dil & RV function sl reduced;  CDopplers 9/11 showed norm looking carotids w/ good flow but tort distal ICAs...    BP remains well controlled at 124/68 on her 3 meds;  she denies CP, palpit, dizziness, SOB, ch in edema, etc;  Chol looks good on the Prav40;   mild renal insuffic w/ Creat 1.5;  she still sees DrPlovsky every 76mo...   Problem List:  HEARING LOSS - ENT eval 9/10 by DrTeoh...  BRONCHITIS, RECURRENT (ICD-491.9) - ** see above ** Hx of PNEUMONIA (ICD-486)  HYPERTENSION (ICD-401.9) - on ASA 325mg /d, LOPRESSOR 50mg Bid, NORVASC 5mg /d, & LASIX 40mg - 2tabsQam... BP=138/64 & doing well... denies HA, visual changes, CP, palipit, dizziness, syncope, change in dyspnea, or incr edema...  CORONARY ARTERY DISEASE (ICD-414.00) - S/P CABG 2003 w/ vein graft to PDA... no CP, palpit, or change in dyspnea... she is still pretty sedentary... followed by Aletta Edouard for Cards...  ~   last NuclearStressTest 6/08 showed RBBB, no ischemia, EF=76%...  ~  labs 9/09 showed BNP= 353 on Lasix 80mg /d.  ~  labs 1/10 showed BNP= 204  ~  saw DrCrenshaw 9/10 w/ 2DEcho- mild LVH, EF=60%, wall motion norm, tissue AoV prosthesis- OK.  ~  9/11: she has f/u w/ Cards next week...  Hx of VALVULAR HEART DISEASE (ICD-424.90) - S/P tissue AVR & Thor AA repair 2003.  ~  she knows about the need for SBE prophylaxis.  ~  2DEcho 3/09 showed a bioprosthetic valve w/ 21mm gradient, &  norm LVF w/ EF= 55-60%.  ~  2DEcho 9/10- mild LVH, EF=60%, wall motion norm, tissue AoV prosthesis- working well...  Hx of ATRIAL FIBRILLATION (ICD-427.31) - PAF prev on coumadin & stopped 2/09... she is holding NSR...  CEREBROVASCULAR ACCIDENT, HX OF (ICD-V12.50) - Eval and Rx DrSethi...  ~  CDoppler 10/08 w/ tort ICA's, no plaque, normal carotids.  ~  CDoppler 4/10 at [facility] read by [facility] showed bilat 40-59% ICA stenoses believed to be from tortuosity of vessels.  HYPERLIPIDEMIA (ICD-272.4) - sher stopped the Crestor 5mg  again... she will agree to try Prav40.  ~  last FLP 1/08 showed TChol 128, TG 94, HDL 40, LDL 69...  ~  FLP 1/10 on diet showed TChol 247, TG 200, HDL 40, LDL 166... rec> start Crestor5.  ~  FLP 7/10 on Cres5 showed TChol 157, TG 202, HDL 42, LDL 85... rec> continue same.  ~   11/10: pt reports that she stopped the Crestor ?why- ?mild symptoms, advised to restart Rx.  ~  FLP 3/11 on diet alone showed TChol 258, TG 359, HDL 49, LDL 140... start PRAV40.  ~  FLP 9/11 on Prav40 showed TChol 210, TG 364, HDL 41, LDL 103... needs better low fat diet.  GERD (ICD-530.81) - on OMEPRAZOLE 20mg /d Prn & controls her symptoms.  DIVERTICULOSIS OF COLON (ICD-562.10) & HEMORRHOIDS (ICD-455.6) - uses Miralax Prn...  ~  last colon 10/00 [facility] w/ divertics, hems, cecal AVM.  RENAL INSUFFICIENCY (ICD-588.9) & Hx of UTI (ICD-599.0)  ~  labs 3/09 showed BUN= 20, Creat= 1.4  ~  labs 9/09 showed BUN= 17, Creat= 1.1  ~  labs 7/10 showed BUN= 21, Creat= 1.4  ~  labs 3/11 showed BUN= 17, Creat= 1.2  ~  labs 9/11 showed BUN= 22, Creat= 1.3  DEGENERATIVE JOINT DISEASE (ICD-715.90) - s/p left THR 7/04 by DrAlusio.  ~  labs 7/10 showed Vit D level = 30... rec to start Vit D 1000 u daily OTC.  ~  labs 3/11 showed Vit D level = 26... rec incr Vit D to 2000 u daily.  BACK PAIN, LUMBAR (ICD-724.2) - Hosp 4/10 after fall at home w/ L1 compression fx & retropulsed fragment in canal- eval by DrElsner w/ rec for conservative management w/ brace, rest, rehab...  DEMENTIA (ICD-294.8) - Prev hx DELIRIUM w/ Abn MRI showing atrophy, sm vessel dis, old infarct... then hosp 12/08 w/ aud hallucinations & psych rx from DrWilliford w/ Risperdal, Depakote, Ativan... 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 apparently no better off coumadin, but they left her off it at disch to the nursing home)... she is now stable on a complex psychotopic regimen per [facility], Psychiatry... see med list...  ~  1/10:  now off Risperdal... on Depakote 250mg /d, Celexa 20mg - 1.5tabsQhs.  ~  6/10:  now off Depakote, on Celexa alone &  followed by DrPlovsky.  ANXIETY (ICD-300.00)  ANEMIA (ICD-285.9)  ~  labs 3/09 showed Hg= 11.7  ~  labs 9/09 showed Hg 12.2  ~  labs 1/10 showed Hg= 12.8  ~  labs 7/10 showed Hg= 13.0  ~  labs 3/11 showed Hg= 12.8   Preventive Screening-Counseling & Management  Alcohol-Tobacco     Smoking Status: never  Allergies: 1)  ! Pcn 2)  ! Sulfa 3)  ! Biaxin 4)  ! *  Ivp Dye 5)  ! Prednisone 6)  ! * Statins 7)  ! * Cephtin  Comments:  Nurse/Medical Assistant: The patient's medications and allergies were reviewed with the patient and were updated in the Medication and Allergy Lists.  Past History:  Past Medical History: HEARING LOSS (ICD-389.9) BRONCHITIS, RECURRENT (ICD-491.9) HYPERTENSION (ICD-401.9) CORONARY ARTERY DISEASE (ICD-414.00) AORTIC VALVE REPLACEMENT, HX OF (ICD-V43.3) Hx of ATRIAL FIBRILLATION (ICD-427.31) CEREBROVASCULAR ACCIDENT, HX OF (ICD-V12.50) CAROTID STENOSIS (ICD-433.10) - CDopplers neg for blockage, just tortuous vessels distally. HYPERLIPIDEMIA (ICD-272.4) GERD (ICD-530.81) DIVERTICULOSIS OF COLON (ICD-562.10) HEMORRHOIDS (ICD-455.6) RENAL INSUFFICIENCY (ICD-588.9) DEGENERATIVE JOINT DISEASE (ICD-715.90) OSTEOPOROSIS (ICD-733.00) DEMENTIA (ICD-294.8) ANXIETY (ICD-300.00) ANEMIA (ICD-285.9)  Past Surgical History: HIP REPLACEMENT, LEFT, HX OF (ICD-V43.64) S/P HYSTERECTOMY INTRAOCULAR LENS IMPLANT, BILATERAL, HX OF (ICD-V43.1) CATARACT EXTRACTIONS, BILATERAL, HX OF (ICD-V45.61) CORONARY ARTERY BYPASS GRAFT, HX OF (ICD-V45.81) AORTIC VALVE REPLACEMENT, HX OF (ICD-V43.3)  Family History: Reviewed history from 11/26/2009 and no changes required. mother deceased father deceased pt was very young when parents died and is unsure of their age or reasons. 1 brother deceased age 23  hx of DM 1 sister alive age 50  hx of DM 1 brother deceaed age 45   hx of DM  Social History: Reviewed history from 11/26/2009 and no changes required. Retired    Married  Tobacco Use - Never Alcohol Use - no caffeine use---coffee daily  Review of Systems      See HPI       The patient complains of decreased hearing, dyspnea on exertion, peripheral edema, incontinence, and difficulty walking.  The patient denies anorexia, fever, weight loss, weight gain, vision loss, hoarseness, chest pain, syncope, prolonged cough, headaches, hemoptysis, abdominal pain, melena, hematochezia, severe indigestion/heartburn, hematuria, muscle weakness, suspicious skin lesions, transient blindness, depression, unusual weight change, abnormal bleeding, enlarged lymph nodes, and angioedema.    Vital Signs:  Patient profile:   77 year old female Height:      63 inches Weight:      176 pounds BMI:     31.29 O2 Sat:      96 % on Room air Temp:     97.1 degrees F oral Pulse rate:   75 / minute BP sitting:   124 / 78  (left arm) Cuff size:   regular  Vitals Entered By: Randell Loop CMA (May 26, 2010 10:35 AM)  O2 Sat at Rest %:  96 O2 Flow:  Room air CC: 6 month ROV & review of mult medical problems... Is Patient Diabetic? No Pain Assessment Patient in pain? yes      Onset of pain  hip pain  Comments no changes in meds today   Physical Exam  Additional Exam:  WD, sl overweight, 77 y/o WF in NAD... flattened affect apparent, without change... GENERAL:  Alert & oriented x3... HEENT:  [place]/AT, EOM-wnl, PERRLA, EACs-clear, TMs-wnl, NOSE-clear, THROAT- sl dry, clear & wnl. NECK:  Supple w/ fairROM; no JVD; normal carotid impulses w/o bruits; no thyromegaly or nodules palpated; no lymphadenopathy. CHEST:  Clear to P & A; without wheezes/ rales/ or rhonchi heard... mild congested cough... HEART: s/p med sternotomy, regular,  gr1/6 SEM without rubs or gallops apprec... ABDOMEN:  Soft & nontender; normal bowel sounds; no organomegaly or masses detected. EXT: without deformities, s/p left THR, mod arthritic changes; no varicose veins/ +venous insuffic/ tr edema is  noted... NEURO:  CN's intact;  back brace, L1 compression, no focal neuro changes... DERM:  No lesions noted; no rash etc..Marland Kitchen  EKG  Procedure date:  05/26/2010  Findings:      DATA REVIEWED:   OV DrCrenshaw 12/02/09 & 2DEcho & CDopplers... Fasting labs 05/27/10 >> reviewed & pt notified...   Impression & Recommendations:  Problem # 1:  HYPERTENSION (ICD-401.9) Controlled on meds>  continue same. Her updated medication list for this problem includes:    Lopressor 50 Mg Tabs (Metoprolol tartrate) .Marland Kitchen... Take one tablet by mouth two times a day    Norvasc 5 Mg Tabs (Amlodipine besylate) .Marland Kitchen... Take 1 tablet by mouth once a day    Furosemide 40 Mg Tabs (Furosemide) .Marland Kitchen... Take 2 tabs by mouth once daily in am...  Problem # 2:  CORONARY ARTERY DISEASE (ICD-414.00) Followed by Aletta Edouard as noted> HBP, CAD, CABG, AVR, PAF, etc>  stable w/o angina.  continue same meds. Her updated medication list for this problem includes:    Ecotrin 325 Mg Tbec (Aspirin) .Marland Kitchen... Take 1 tablet by mouth once a day    Lopressor 50 Mg Tabs (Metoprolol tartrate) .Marland Kitchen... Take one tablet by mouth two times a day    Norvasc 5 Mg Tabs (Amlodipine besylate) .Marland Kitchen... Take 1 tablet by mouth once a day    Furosemide 40 Mg Tabs (Furosemide) .Marland Kitchen... Take 2 tabs by mouth once daily in am...  Problem # 3:  HYPERLIPIDEMIA (ICD-272.4) FLP on Prav40 appears stable>  continue same meds... Her updated medication list for this problem includes:    Pravastatin Sodium 40 Mg Tabs (Pravastatin sodium) .Marland Kitchen... Take 1 tab by mouth at bedtime...  Problem # 4:  GI >>> Similarly stable on Omep & Miralax Prn...  Problem # 5:  RENAL INSUFFICIENCY (ICD-588.9) Renal function stable w/ Creat  ~1.5 range...  Problem # 6:  DEGENERATIVE JOINT DISEASE (ICD-715.90) S/p left THR  w/ recurrent pain in this area... Her updated medication list for this problem includes:    Ecotrin 325 Mg Tbec (Aspirin) .Marland Kitchen... Take 1 tablet by mouth once a day  Problem  # 7:  DEMENTIA (ICD-294.8) Hx of dementia & delirium w/ abn MRI as noted,  she still takes Celexa from [employer]  Complete Medication List: 1)  Ecotrin 325 Mg Tbec (Aspirin) .... Take 1 tablet by mouth once a day 2)  Lopressor 50 Mg Tabs (Metoprolol tartrate) .... Take one tablet by mouth two times a day 3)  Norvasc 5 Mg Tabs (Amlodipine besylate) .... Take 1 tablet by mouth once a day 4)  Furosemide 40 Mg Tabs (Furosemide) .... Take 2 tabs by mouth once daily in am... 5)  Pravastatin Sodium 40 Mg Tabs (Pravastatin sodium) .... Take 1 tab by mouth at bedtime.Marland KitchenMarland Kitchen 6)  Omeprazole 20 Mg Cpdr (Omeprazole) .... Take 1 tab by mouth as needed... 7)  Miralax Powd (Polyethylene glycol 3350) .... As needed 8)  Senna-plus 8.6-50 Mg Tabs (Sennosides-docusate sodium) .... Take 2 tablets by mouth daily 9)  Ra Calcium 500 Mg Tabs (Calcium) .... Take 2 tablets by mouth once daily 10)  Vitamin D 2000 Unit Caps (Cholecalciferol) .Marland Kitchen.. 1 tab by mouth once daily 11)  Folic Acid 1 Mg Tabs (Folic acid) .... Take 1 tablet by mouth once a day 12)  Celexa 20 Mg Tabs (Citalopram hydrobromide) .... Give 1 and 1/2 tab by mouth at bedtime as directed by drplovsky...  Patient Instructions: 1)  Today we updated your med list- see below.... 2)  Continue your current meds the same for now... 3)  Please return to our lab one morning this week for your FASTING blood  work... please call the "phone tree" in a few days for your lab results.Marland KitchenMarland Kitchen  4)  Remember to follow a low cholesterol/ LOW FAT diet.Marland KitchenMarland Kitchen 5)  Please avoid the potent arthritis pain meds -NO NSAIDs- you may use extra strength Tylenol as needed... 6)  Call for any questions.Marland KitchenMarland Kitchen 7)  Please schedule a follow-up appointment in 6 months.

## 2010-07-02 LAB — URINALYSIS, ROUTINE W REFLEX MICROSCOPIC
Glucose, UA: NEGATIVE mg/dL
Glucose, UA: NEGATIVE mg/dL
Nitrite: NEGATIVE
Protein, ur: NEGATIVE mg/dL
Specific Gravity, Urine: 1.008 (ref 1.005–1.030)
Urobilinogen, UA: 1 mg/dL (ref 0.0–1.0)
pH: 5 (ref 5.0–8.0)

## 2010-07-02 LAB — CBC
HCT: 38.8 % (ref 36.0–46.0)
Hemoglobin: 11.2 g/dL — ABNORMAL LOW (ref 12.0–15.0)
Hemoglobin: 13.1 g/dL (ref 12.0–15.0)
MCHC: 33.7 g/dL (ref 30.0–36.0)
MCHC: 33.8 g/dL (ref 30.0–36.0)
MCV: 88.6 fL (ref 78.0–100.0)
Platelets: 244 10*3/uL (ref 150–400)
RBC: 3.74 MIL/uL — ABNORMAL LOW (ref 3.87–5.11)
RBC: 4.38 MIL/uL (ref 3.87–5.11)
RDW: 15.7 % — ABNORMAL HIGH (ref 11.5–15.5)
RDW: 16 % — ABNORMAL HIGH (ref 11.5–15.5)
RDW: 16.5 % — ABNORMAL HIGH (ref 11.5–15.5)

## 2010-07-02 LAB — DIFFERENTIAL
Basophils Absolute: 0 10*3/uL (ref 0.0–0.1)
Basophils Relative: 0 % (ref 0–1)
Basophils Relative: 0 % (ref 0–1)
Eosinophils Absolute: 0.3 10*3/uL (ref 0.0–0.7)
Eosinophils Relative: 1 % (ref 0–5)
Lymphs Abs: 1.1 10*3/uL (ref 0.7–4.0)
Monocytes Absolute: 0.3 10*3/uL (ref 0.1–1.0)
Monocytes Absolute: 0.5 10*3/uL (ref 0.1–1.0)
Monocytes Relative: 10 % (ref 3–12)
Monocytes Relative: 4 % (ref 3–12)
Neutro Abs: 3.6 10*3/uL (ref 1.7–7.7)

## 2010-07-02 LAB — BASIC METABOLIC PANEL
CO2: 26 mEq/L (ref 19–32)
Chloride: 104 mEq/L (ref 96–112)
GFR calc Af Amer: 47 mL/min — ABNORMAL LOW (ref >60)
Glucose, Bld: 98 mg/dL (ref 70–99)
Potassium: 4.1 mEq/L (ref 3.5–5.1)
Sodium: 140 mEq/L (ref 135–145)

## 2010-07-02 LAB — URINE CULTURE
Colony Count: 2000
Special Requests: NEGATIVE

## 2010-07-02 LAB — POCT CARDIAC MARKERS
CKMB, poc: <1 ng/mL — ABNORMAL LOW (ref 1.0–8.0)
Myoglobin, poc: 114 ng/mL (ref 12–200)
Troponin i, poc: <0.05 ng/mL (ref 0.00–0.09)

## 2010-07-02 LAB — APTT: aPTT: 29 seconds (ref 24–37)

## 2010-07-02 LAB — COMPREHENSIVE METABOLIC PANEL
ALT: 12 U/L (ref 0–35)
ALT: 12 U/L (ref 0–35)
AST: 20 U/L (ref 0–37)
Albumin: 3.2 g/dL — ABNORMAL LOW (ref 3.5–5.2)
Alkaline Phosphatase: 114 U/L (ref 39–117)
Alkaline Phosphatase: 83 U/L (ref 39–117)
CO2: 24 mEq/L (ref 19–32)
Calcium: 8.2 mg/dL — ABNORMAL LOW (ref 8.4–10.5)
GFR calc Af Amer: 50 mL/min — ABNORMAL LOW (ref >60)
Glucose, Bld: 123 mg/dL — ABNORMAL HIGH (ref 70–99)
Potassium: 3.8 mEq/L (ref 3.5–5.1)
Potassium: 4.1 mEq/L (ref 3.5–5.1)
Sodium: 133 mEq/L — ABNORMAL LOW (ref 135–145)
Sodium: 137 mEq/L (ref 135–145)
Total Protein: 5.5 g/dL — ABNORMAL LOW (ref 6.0–8.3)
Total Protein: 6.4 g/dL (ref 6.0–8.3)

## 2010-07-02 LAB — URINE MICROSCOPIC-ADD ON

## 2010-07-02 LAB — TYPE AND SCREEN: ABO/RH(D): O POS

## 2010-07-14 ENCOUNTER — Telehealth: Payer: Self-pay | Admitting: Pulmonary Disease

## 2010-07-14 DIAGNOSIS — M25559 Pain in unspecified hip: Secondary | ICD-10-CM

## 2010-07-14 NOTE — Telephone Encounter (Signed)
Spoke with pt and notified of the above recs per SN.  Pt verbalized understanding.

## 2010-07-14 NOTE — Telephone Encounter (Signed)
Called and spoke with pt and she stated that she had to cancel her last appt with SN--she is requesting to have an xray of her right hip due to increase in pain.  SN please advise. thanks

## 2010-07-14 NOTE — Telephone Encounter (Signed)
Per SN----he recs to save and step and go to ortho ASAP for eval and xray.   They may be able to give a shot to help with the pain.   Will set up appt with [facility] ASAP with any doctor that can see her first.

## 2010-08-04 ENCOUNTER — Other Ambulatory Visit: Payer: Self-pay | Admitting: Pulmonary Disease

## 2010-08-05 NOTE — H&P (Signed)
NAME:  [name], [name]                   ACCOUNT NO.:  [id number]   MEDICAL RECORD NO.:  [id number]          PATIENT TYPE:  OBV   LOCATION:  3730                         FACILITY:  [facility]   PHYSICIAN:  Noralyn Pick. Eden Emms, MD, FACCDATE OF BIRTH:  [birth year]   DATE OF ADMISSION:  09/07/2006  DATE OF DISCHARGE:                              HISTORY & PHYSICAL   CARDIOLOGIST:  Dr. Olga Millers   PRIMARY CARE PHYSICIAN:  Dr. Alroy Dust   CHIEF COMPLAINT:  Near-syncope.   HISTORY OF PRESENT ILLNESS:  [name] is an 77 year old female patient  with a history of tissue aortic valve replacement, single-vessel CABG  and thoracic aortic aneurysm repair in 2003, paroxysmal atrial  fibrillation on chronic Coumadin therapy, who was just discharged from  [facility] on June 10 after presenting with palpitations and  chest pain.  At that time, she was set up for an outpatient stress  nuclear scan.  This was done recently.  The results are not known at  this time.  She actually presented back to the emergency room yesterday  morning.  This time, she came back with palpitations.  Her D-dimer was  elevated and she had a chest CT that was done.  This showed chronic  postoperative changes that were stable, but no pulmonary embolus.  She  was discharged to home and actually had an appointment today with Dr.  Jens Som.  However, she noted an episode of near-syncope.  She had been  feeling well and had been up on her feet for awhile and washing the  dishes.  She had sudden onset of near-syncope.  This was associated with  weakness and nausea.  She denies any frank syncope.  The symptoms lasted  about ten minutes.  She had no associated chest pain or shortness of  breath.  She had no associated palpitations.  She does, however, note  increased palpitations over the last three weeks.  This started after an  upper respiratory tract infection.  She has completed her antibiotics.  She is still on chronic  methylprednisolone.  She notes that over the  three weeks, her palpitations have sometimes been fast and have  sometimes been slow.  She sometimes get lightheaded with these, but has  not had a near-syncopal episode like she had today.  When she was  discharged on June 10, it was presumed that her palpitations were  probably recurrent paroxysmal atrial fibrillation and Diltiazem was  added to her medical regimen.  Norvasc was discontinued.   PAST MEDICAL HISTORY:  1. Paroxysmal atrial fibrillation on chronic Coumadin therapy.  2. Status post tissue aortic valve replacement September of 2003.  3. Coronary artery disease status post CABG in September of 2003 with      a vein graft to the PDA.  4. Status post thoracic aortic aneurysm repair 2003.  5. Good LV function, EF 62% by echocardiogram January of 2008.  6. History of stroke 2004.  7. Hypertension.  8. Hyperlipidemia.  9. History of small cecal AVM.  10.Diverticulosis.  11.History of anemia.  12.Mild mitral  regurgitation.  13.Polymyalgia rheumatica.  14.History of pneumonia January of 2008 - status post thoracentesis.  15.History of vertigo.  16.Dementia.  17.Anxiety.  18.Osteoarthritis.  19.Osteoporosis.   MEDICATIONS:  1. Risperdal 4 mg daily.  2. Coumadin 5 mg daily except for 2 1/2 mg on Tuesdays and Thursdays.  3. Metoprolol 50 mg b.i.d.  4. Prilosec 20 mg daily.  5. Methylprednisolone 4 mg daily.  6. Diltiazem CD 120 mg daily.  7. Potassium 20 mEq daily.   ALLERGIES:  BIAXIN, PENICILLIN, CEFTIN, ZOCOR, PREDNISONE.   SOCIAL HISTORY:  She lives in [place] with her husband.  She has two  children.  She denies tobacco and alcohol abuse.   FAMILY HISTORY:  Insignificant for premature coronary artery disease.   REVIEW OF SYSTEMS:  Please see HPI.  Denies any fever, chills, cough,  melena, hematochezia, hematuria, dysuria.  She denies any orthopnea or  paroxysmal nocturnal dyspnea.  She has occasional right foot  swelling  that is mild.  She has dyspnea on exertion that has been chronic over  the last several years without changes.  She sometimes notes some  discomfort in her chest when she has palpitations.  She otherwise denies  any exertional chest discomfort.  Rest of the review of systems are  negative.   PHYSICAL EXAMINATION:  GENERAL:  She is a well-nourished well-developed  female.  VITAL SIGNS:  Blood pressure is 170/72, pause 69, respirations 16, she  has a saturation of 90% on two liters, temperature 97.5.  HEENT:  Normal.  NECK:  Without JVD.  Carotids without bruits bilaterally.  ENDOCRINE:  Without thyromegaly or lymphadenopathy.  CARDIAC:  Normal S1-S2.  Regular rate and rhythm with a 2/6 systolic  ejection murmur heard best right upper sternal border.  LUNGS:  Clear to auscultation bilaterally without wheezing, rhonchi or  rales.  ABDOMEN:  Soft, nontender with normoactive bowel sounds.  No  hepatomegaly.  No rebound or guarding.  EXTREMITIES:  Without edema.  Calves are soft and nontender.  SKIN:  Warm and dry.  NEUROLOGIC:  She is alert and oriented x3.  Cranial nerves II-XII  grossly intact.  Dorsalis pedis and posterior tibialis pulses are 2+  bilaterally.   Electrocardiogram reveals sinus rhythm with first-degree AV block, heart  rate is 65, right bundle-branch block.   IMPRESSION:  1. Near-syncope.  2. Palpitations.  3. Paroxysmal atrial fibrillation.        A:  Coumadin therapy.        B:  Currently in sinus rhythm.  1. Coronary artery disease status post single-vessel coronary artery      bypass graft in 2003.        A:  Recent nuclear scan June of 2008 - results pending.  1. Status post tissue aortic valve replacement 2003.  2. Status post thoracic aortic aneurysm repair 2003.  3. Good left ventricular function.  4. Hypertension.  5. Hyperlipidemia.  6. History of stroke 2004.  7. Polymyalgia rheumatica. 8. Osteoarthritis.  9. History of dementia.   10.History of anxiety.   PLAN:  The patient was also seen and examined by Dr. Eden Emms today.  She  certainly has evidence of conduction system disease on  electrocardiogram.  We will admit her overnight and observe her on  telemetry.  She most likely is having paroxysms of atrial fibrillation  in the setting of sick sinus syndrome.  If she has no significant brady  arrhythmias overnight on telemetry, then she would likely be eligible  for  discharge tomorrow.  We will need to decide prior to discharge  whether or not she would benefit from implantation of a loop recorder  versus placement of 30-day CardioNet monitor.  We will continue her rate  control medications at this point in time.      Tereso Newcomer, PA-C      Peter C. Eden Emms, MD, [facility]  Electronically Signed    SW/MEDQ  D:  09/07/2006  T:  09/07/2006  Job:  [id number]   cc:   Lonzo Cloud. Kriste Basque, MD

## 2010-08-05 NOTE — Discharge Summary (Signed)
NAME:  [name], [name]                   ACCOUNT NO.:  [id number]   MEDICAL RECORD NO.:  [id number]          PATIENT TYPE:  INP   LOCATION:  5153                         FACILITY:  [facility]   PHYSICIAN:  Lonzo Cloud. Kriste Basque, MD     DATE OF BIRTH:  1924-01-03   DATE OF ADMISSION:  03/18/2007  DATE OF DISCHARGE:  03/30/2007                               DISCHARGE SUMMARY   ADDENDUM:  Please see the discharge summary of March 28, 2007 for full  details.  The patient stayed in the hospital two more days for a level  II Passar which was required for her replacement in the nursing home.  In the final analysis, the family decided to take her home, and she was  discharged on March 30, 2007 as indicated in the prior discharge  summary.      Lonzo Cloud. Kriste Basque, MD  Electronically Signed     SMN/MEDQ  D:  05/28/2007  T:  05/30/2007  Job:  [id number]

## 2010-08-05 NOTE — Discharge Summary (Signed)
NAME:  [name]                   ACCOUNT NO.:  [id number]   MEDICAL RECORD NO.:  [id number]          PATIENT TYPE:  OBV   LOCATION:  4729                         FACILITY:  [facility]   PHYSICIAN:  Madolyn Frieze. Jens Som, MD, FACCDATE OF BIRTH:  January 02, 1924   DATE OF ADMISSION:  08/30/2006  DATE OF DISCHARGE:  08/31/2006                               DISCHARGE SUMMARY   DISCHARGING DIAGNOSES:  1. Chest pain.  Negative cardiac enzymes.  EKG without acute ST or T-      wave changes.  2. Increased palpitations with questionable paroxysmal atrial      fibrillation episode prior to arrival in the ER.  The patient with      known history of paroxysmal atrial fibrillation.  3. Hypokalemia with a potassium of 2.6 on arrival in the emergency      room.  At time of discharge, potassium 3.9.  4. Chronic anticoagulation therapy with Coumadin.  INR at discharge      2.2  5. Recent treatment for upper respiratory infection.  The patient      treated by Dr. Kriste Basque with Levaquin.  completed her last dose on      August 30, 2006.   PAST MEDICAL HISTORY:  1. Aortic valve replacement with a bovine graft.  2. CVA in 2004.  3. Paroxysmal atrial fibrillation.  4. Coronary artery disease status post saphenous vein graft to the PDA  5. Anxiety.  6. Chronic anemia.  7. Diverticulosis.  8. Degenerative joint disease and osteoporosis.  9. History of pneumonia resulting in acute renal insufficiency      secondary to dehydration.  10.Hyperlipidemia.  11.Hypertension.  12.Thoracic aortic aneurysm repair.  13.Status post thoracentesis in January of 2008.   HOSPITAL COURSE:  [name] is a very pleasant 77 year old Caucasian  female followed by Dr. Olga Millers and Lorin Picket ADL who presented to  [facility] complaining of weakness, palpitations and chest pain.  Ms.  Bohne noticed increased palpitations this admission.  She states she has  episodes of this at home that usually last no more than an hour and  usually  resolve if she lays down quietly and rests.  She states she was  recently treated for upper respiratory infection on Levaquin and Mucinex  with improvement in symptoms; however, for the last 2 days, she has had  more frequent palpitations.  Denied any presyncope or syncopal episodes.  The evening prior to admission, she got up to go to the bathroom.  Her  heart was racing.  It stopped racing intermittently but had repeated  episodes.  Came to the emergency room.  The patient was found to be  hypokalemic with a potassium of 2.6.  She was treated with IV potassium,  found to be in normal sinus rhythm.  No documented strips of her  tachypalpitations available.  Presumed to be paroxysmal atrial  fibrillation; however, had resolved by the time she arrived in the ER.  Adjustments were made in medication.  The patient continued to remain in  sinus rhythm with PACs.  Potassium stabilized.  Dr. Jens Som  in to see  the patient on day of discharge.  The patient ruled out for a myocardial  infarction with cardiac enzymes and EKG.  She has been discharged home  with instructions to follow up with him and Dr. Kriste Basque outpatient.  I  have arranged for her to have a BMET drawn on September 02, 2006, an  adenosine stress Myoview on September 02, 2006 at noon, and a Coumadin Clinic  visit on September 02, 2006 at 11:45.  She will follow up with Dr. Jens Som  for review and reevaluation on September 17, 2006 at 11:30.  No restrictions  in her activity at this time.  Resume her heart healthy diet.  She is to  seek medical assistance if her palpitations return or she experiences a  rapid heart rate or chest discomfort.  I have instructed her to  discontinue her Norvasc.   MEDICATIONS AT DISCHARGE:  1. Furosemide 40 mg daily.  2. Coumadin as previously taken, 5 mg daily except 2.5 on Tuesdays and      Thursdays.  3. Metoprolol 50 b.i.d.  4. Prilosec 20 daily.  5. Methylprednisone 4 mg daily.  (The patient will follow up with  Dr.      Kriste Basque who will possibly consider weaning the patient off      prednisone).  6. Diltiazem 120 mg daily.  This is new.  She has been given a      prescription.  7. Potassium 20 mEq daily.  This is new.  She has been given a      prescription for this.   DURATION OF DISCHARGE ENCOUNTER:  Duration of discharge encounter along  with paperwork and follow up arrangement greater than 30 minutes.      Dorian Pod, ACNP      Madolyn Frieze. Jens Som, MD, [facility]  Electronically Signed    MB/MEDQ  D:  08/31/2006  T:  08/31/2006  Job:  [id number]   cc:   Lonzo Cloud. Kriste Basque, MD

## 2010-08-05 NOTE — Consult Note (Signed)
[name], [name]                   ACCOUNT NO.:  [id number]   MEDICAL RECORD NO.:  [id number]          PATIENT TYPE:  INP   LOCATION:  1416                         FACILITY:  [facility]   PHYSICIAN:  Stefani Dama, M.D.  DATE OF BIRTH:  15-Sep-1923   DATE OF CONSULTATION:  06/22/2008  DATE OF DISCHARGE:                                 CONSULTATION   REQUESTOR:  Scott M. Kriste Basque, MD   REASON FOR REQUEST:  L1 compression fracture secondary to fall.   HISTORY OF PRESENT ILLNESS:  The patient is an 77 year old white female  who sustained an L1 compression fracture secondary to a fall from the  standing height.  She may have tripped and does not recall the exact  events, but after the fall, she experienced significant pain in her back  with some radiation to the right buttock and right leg.  She notes that  her right leg and foot did not feel normal.  A CT scan of the abdomen  showed that the patient has an L1 compression fracture with about 50%  loss of height.  There is retropulsed bone causing some canal compromise  of about 25-30%.  She complains of severe pain in her back and her lower  extremities.   PAST MEDICAL HISTORY:  Significant for an aortic valve replacement and  aortic aneurysm repair.  She is status post hip arthroplasty and has a  multitude of medical problems which are noted in the history and  physical examination including history of bronchitis and recurrent  pneumonia, hypertension, coronary artery disease, and a history of CVA.  She also has some renal insufficiency and gastroesophageal reflux  disease.   PHYSICAL EXAMINATION:  GENERAL:  The patient appears quite debilitated  and she is an elderly lady who is lying flat in bed.  EXTREMITIES:  Testing of the lower extremity strength when testing the  motor function on the right yield significant pain in her back, which  reduces the patient's effort, nonetheless that could elicit to brief  periods of time, a 4/5 effort  in the iliopsoas and the quad, tibialis  anterior, and the gastroc.  At the left lower extremity, she is much  more cooperative with strength testing.  Sensation appears to be intact  grossly in the distal lower extremities turning from side-to-side has  yielded substantial problems with back pain.   IMPRESSION:  The patient has evidence of L1 compression fracture with  retropulsed bone in the spinal canal.  She is neurologically intact  except for the significant amount of pain that she has in her back.  Because of her multitude of medical problems, I believe that this  patient will be best treated conservatively.  I do not believe that she  would tolerate a significant surgical intervention to decompress and  stabilize this and particularly because there is nothing to stabilize  to, I suspect her adjacent bones have this severe osteoporosis as does  L1.  The decompression in this individual and any major surgical  intervention would not be tolerated by her from a physical standpoint.  In that regard, the best and only treatment is conservative management  with bedrest for a period of about 5 days, fitting for a TLSO and then  ultimately mobilization.  I did suggest to the patient that she may be a  candidate for acrylic bone  kyphoplasty, but I would attempt efforts of conservative management  first as even the kyphoplasty has significant risks and individuals with  retropulsed bone.  We will follow along conservatively.  I have written  an order for the TLSO.      Stefani Dama, M.D.  Electronically Signed     HJE/MEDQ  D:  06/22/2008  T:  06/23/2008  Job:  [id number]

## 2010-08-05 NOTE — Discharge Summary (Signed)
NAME:  [name], [name]                   ACCOUNT NO.:  [id number]   MEDICAL RECORD NO.:  [id number]          PATIENT TYPE:  INP   LOCATION:  1416                         FACILITY:  [facility]   PHYSICIAN:  Lonzo Cloud. Kriste Basque, MD     DATE OF BIRTH:  08-12-1923   DATE OF ADMISSION:  06/22/2008  DATE OF DISCHARGE:  06/29/2008                               DISCHARGE SUMMARY   FINAL DIAGNOSES:  1. Admitted June 22, 2008, after a fall at home with L1 compression      fracture and retropulsed bone fragment in canal.  The patient was      evaluated by Dr. Danielle Dess for neurosurgery who suggested bed rest and      TLSO brace with conservative management and consideration of      possible kyphoplasty if necessary later on.  The patient was placed      at bed rest with pain medication and gradually mobilized with the      recommended brace.  Seen by physical therapy and referred for      comprehensive inpatient rehab.  2. History of recurrent bronchitis and previous pneumonia - no      problems this admission.  3. Hypertension - controlled on medications.  4. Coronary artery disease with previous coronary artery bypass graft      in 2003.  5. History of valvular heart disease with previous tissue aortic valve      replacement and thoracic aortic aneurysm repair in 2003.  6. History of paroxysmal atrial fibrillation previously on Coumadin      but this was stopped in February of 2009.  7. History of previous stroke with evaluation by Dr. Pearlean Brownie.  8. Hypercholesterolemia on diet alone.  9. Gastroesophageal reflux disease on omeprazole.  10.Diverticulosis and hemorrhoids.  11.Degenerative arthritis with previous left total hip replacement by      Dr. Ollen Gross in 2004.  12.History of dementia with delirium and psychotic features that      resulted in hospitalization in [place].  13.Mild anemia with hemoglobin of 11.2.   BRIEF HISTORY AND PHYSICAL:  The patient is an 77 year old white female  well-known  to me with multiple medical problems as noted above.  She  woke up on the morning of June 22, 2008, and got up to use the bathroom.  She lost her balance and fell on her back.  She had immediate pain and  discomfort over her back and radiating down her right leg.  She was  brought to the emergency room for evaluation where she was found to have  a superior endplate compression fracture of L1 with 4 mm bone  retropulsion in the canal.  She was seen by Dr. Danielle Dess in the emergency  room who recommended admission by internal medicine for bed rest and  pain control.  He recommended a TLSO brace and mobilization after a  period of bed rest and pain control.   PAST MEDICAL HISTORY:  Please see above problem list.   PHYSICAL EXAMINATION:  GENERAL:  At time of admission revealed an  elderly  white female in mild distress from pain.  VITAL SIGNS:  Blood pressure 160/60, pulse 70 and regular, respirations  22 per minute and slightly shallow, temperature 97 degrees orally.  HEENT:  Was unremarkable.  NECK:  Neck exam showed no jugular distention, no carotid bruits, no  thyromegaly or lymphadenopathy.  CHEST:  Exam was clear to percussion and auscultation.  CARDIOVASCULAR:  Exam revealed a grade 2/6 systolic ejection murmur in  the aortic area.  No rubs or gallops detected.  ABDOMEN:  The abdomen was soft and nontender without evidence of  organomegaly or masses.  EXTREMITIES:  Extremities showed some venous insufficiency and trace  edema.  There was pain on movement of the right leg and movement in  general.  She was unable to sit up without severe pain.  NEUROLOGICAL:  Exam showed her to be alert and oriented.  She was moving  all four extremities but had bilateral lower extremity weakness and pain  with movement as noted.   LABORATORY DATA:  EKG showed normal sinus rhythm, right bundle branch  block and nonspecific ST-T wave changes.  No acute abnormalities.  Chest  x-ray showed postoperative  changes and no acute disease.  Hip films  showed previous left total hip replacement, right hip was okay except  for some moderate degenerative changes.  CT scan of the brain showed  small vessel disease but no acute abnormalities.  CT scan of the abdomen  showed an L1 compression fracture with 4 mm bony retropulsion.  There  was evidence of diverticulosis but no acute abnormalities within the  abdomen.   CBC showed hemoglobin 13.1, hematocrit 38.8, white count 8400.  Repeat  hemoglobin 11.2 after IV fluids.  Pro-time 13.6, INR 1.0, PTT 29  seconds.  Sodium 140, potassium 4.1, chloride 104, CO2 26, BUN 24,  creatinine 1.3, blood sugar 98, calcium 9.1.  Urinalysis was clear  except for some leukocytes and culture grew only 2000 colonies of  insignificant growth.  Total bilirubin 0.8, alk phos 83, SGOT 20, SGPT  12, total protein 5.5, albumin 3.0.   HOSPITAL COURSE:  As noted, the patient was seen in the ER by the ER  staff and evaluation was done.  They consulted Dr. Danielle Dess of  neurosurgery who did not feel she needed any surgical intervention.  He  recommended bed rest and a TLSO brace after several days of bed rest and  pain control.  This was accomplished and she had good pain relief with  rest, heat and pain medication.  She was eventually mobilized with the  TLSO brace applied.  Her husband also being quite elderly was not sure  if he could handle her at home.  She was seen by physical therapy and  recommendations for a comprehensive inpatient rehab consult.  The  patient was seen by Dr. Riley Kill who felt she was a good candidate for  comprehensive inpatient rehab treatment.  Arrangements were made for a  bed at [facility] and she is being transferred on Friday, June 29, 2008.   TRANSFER MEDICATIONS:  1. Ecotrin enteric coated aspirin 325 mg p.o. daily.  2. Lopressor 50 mg p.o. b.i.d.  3. Norvasc 5 mg p.o. daily.  4. Furosemide 40 mg p.o. q. a.m.  5. Omeprazole 20 mg p.o. daily.  6.  Celexa 30 mg p.o. q.h.s.  7. Folic acid 1 mg daily.  8. Multivitamin daily.  9. MiraLax 17 grams in water daily.  10.Senokot S 1-2 tablets p.o. q.h.s. as needed for  constipation.  11.Percocet 5 one tablet p.o. q.6 h p.r.n. pain.  12.Crestor 5 mg p.o. q.h.s.  13.Vitamin D 50,000 units once a week.   FINAL DISPOSITION:  The patient is being transferred to comprehensive  inpatient rehab following her L1 compression fracture.      Lonzo Cloud. Kriste Basque, MD  Electronically Signed     SMN/MEDQ  D:  06/29/2008  T:  06/29/2008  Job:  [id number]

## 2010-08-05 NOTE — Consult Note (Signed)
NAMEFONTELLA, SHAN NO.:  [id number]   MEDICAL RECORD NO.:  [id number]          PATIENT TYPE:  INP   LOCATION:  5153                         FACILITY:  [facility]   PHYSICIAN:  Antonietta Breach, M.D.  DATE OF BIRTH:  September 30, 1923   DATE OF CONSULTATION:  03/21/2007  DATE OF DISCHARGE:                                 CONSULTATION   REQUESTING PHYSICIAN:  Lonzo Cloud. Kriste Basque, MD   REASON FOR CONSULTATION:  Psychosis, agitation.   HISTORY OF PRESENT ILLNESS:  Mrs. [name] is an 77 year old female  admitted to [facility] on March 18, 2007 due to acute mental  status changes.   Since the 20th of December the patient has been displaying progressive  periods of confusion and severe agitation.  She has been having  hallucinations that are disturbing.  She also has been having ideas of  reference with paranoia.  She has included her family in a projection of  paranoia involving a conspiracy.  She has been tried on Seroquel;  however, she has displayed excessive sedation with Seroquel.  Seroquel  is currently at 25 mg nightly.   The patient also is now not eating or drinking her food.  She has  developed mutism, and will only shake her head no in response to  questions.   PAST PSYCHIATRIC HISTORY:  The patient does have a history of several  months of cognitive and memory decline as well as decline in executive  function.  She  also has a history of feeling on edge and muscle tension  with excess worry.  She has been treated with Valium and Ativan at  times.  In review of the physical record, dementia and anxiety are  listed along with Ativan and Valium.   In October 2004 the patient developed a severe episode of confusion and  agitation.  She was stabilized with a combination of Risperdal at a  total of 1 mg per day along with Depakote at 250 mg b.i.d.  By June of  2008 in the medical record, the Risperdal had been increased to 4 mg  nightly and Depakote had  been discontinued.  Neither Depakote or  Risperdal are listed under medications of adverse effect.  Also, the  patient's son does not recall any difficulties with Risperdal or  Depakote.   FAMILY PSYCHIATRIC HISTORY:  None known.   SOCIAL HISTORY:  Two children.  Married.  The patient's husband has been  a major caretaker.  The patient's son is at the bedside, today, he is  reading the book the 36-hour day.  The patient has no history of alcohol  or illegal drugs.  Religion--[employer].  Occupation--retired.  She has  been living with her husband.   PAST MEDICAL HISTORY:  1. Cerebrovascular disease.  2. Dementia.   ALLERGIES:  PENICILLIN, SULFA, CONTRAST MEDIA, BIAXIN, CODEINE,  HISTAMINE, GLUCOPHAGE, LIPITOR, CIPROFLOXACIN,  and PREDNISONE.   MEDICATIONS:  MAR is reviewed.  Psychotropics include:  1. Seroquel 25 mg nightly.  2. Haldol 2-4 mg IV q.6 h. p.r.n.  3. Ativan 0.5 mg q.6  h. p.r.n.   LABORATORY DATA:  Sodium 141, BUN 18, creatinine 1.81, SGOT 27, SGPT 15.  TSH, ESR, folic acid--all within normal limits.  Head CT without  contrast shows no active pathology.  She has chronic small-vessel  disease type changes.   REVIEW OF SYSTEMS:  CONSTITUTIONAL:  Afebrile.  No weight loss.  HEAD:  No trauma.  EYES:  No visual changes.  EARS:  No hearing impairment.  NOSE:  No rhinorrhea.  MOUTH/THROAT:  No sore throat.  NEUROLOGIC:  Neurology is consulted.  PSYCHIATRIC:  As above.  CARDIOVASCULAR:  No  chest pain, palpitations.  RESPIRATORY:  No coughing or wheezing.  GASTROINTESTINAL:  No nausea or vomiting.  GENITOURINARY:  No dysuria.  SKIN:  Unremarkable.  ENDOCRINE/METABOLIC:  No heat or cold intolerance.  MUSCULOSKELETAL:  No deformities.  HEMATOLOGIC/LYMPHATIC:  Unremarkable.   PHYSICAL EXAMINATION:  VITAL SIGNS:  Temperature 97.7, pulse 60,  respiratory rate 18, blood pressure 122/64, O2 saturation on room air  96%.  GENERAL APPEARANCE:  [name] is an elderly female sitting up  in her  hospital chair.  PSYCHIATRIC:  She is alert.  Her eye contact is intermittent.  She  closes her eyes.  Her only response to questions involves an occasional  shaking of the head no.  Her  affect is guarded.  Her mood is fearful.  She he is oriented to self.  She will not cooperate with formal questions on memory, abstraction,  expression, or orientation.  Speech mute.  Thought process is not  assessable due to the patient's guarded resistant position; however,  grossly she does appear to be processing questions based on her physical  response.  Thought content:  She clearly is paranoid with a guarded  affect as above.  Her insight is poor.  Judgment is impaired.   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  AXIS II:  None  AXIS III:  See general medical section.  AXIS IV:  General medical.  AXIS V:  20.   RECOMMENDATIONS:  1. Would discontinue the Seroquel and Haldol.  2. Would start Risperdal 0.25 mg q.a.m. and 0.5 mg at 1600.  3. If the agitation continues, after resolution of hallucinations and      delusions, would add Depakote at 500 mg sprinkles q.p.m.; and would      check a CBC and liver function panel for adverse Depakote effects.  4. Would utilize Ativan 0.52 mg p.o. IM or IV q.6 h. p.r.n. agitation.  Antonietta Breach, M.D.  Electronically Signed     JW/MEDQ  D:  03/21/2007  T:  03/21/2007  Job:  [id number]

## 2010-08-05 NOTE — Assessment & Plan Note (Signed)
[facility]                            CARDIOLOGY OFFICE NOTE   NAME:[name], [name] SKOUSEN                          MRN:          [id number]  DATE:01/19/2007                            DOB:          March 09, 1924    HISTORY OF PRESENT ILLNESS:  [name] is a pleasant female and has a  history of coronary artery disease status post coronary artery bypass  graft, history of thoracic aortic aneurysm repair and aortic valve  replacement.  She also has a history of atrial fibrillation.  She has  had previous episodes of dizzy spells.  Previous CardioNet monitor  showed a normal sinus rhythm with one brief episode of Wenckebach, but  there were no symptoms related to that particular episode.  She recently  had another episode while she was in the grocery store.  She states that  she looked up and suddenly became dizzy and weak all over.  She had a  pain in her neck and felt like her eyes were crossed.  There was also  a question of whether she was leaning to the left.  She did not have  chest pain, but she did have palpitations.  There was no shortness of  breath, but there was mild nausea.  Her dizziness lasted for one hour  and then resolved.  Note she contacted this office and was told by Dr.  Eden Emms to increase the Lopressor to 50 mg p.o. b.i.d.  Note, her blood  pressure was elevated at the time in the 190/90 range.  She has had no  other neurological symptoms.   MEDICATIONS:  1. Metoprolol 50 mg p.o. b.i.d.  2. Coumadin as directed.  3. Omeprazole 20 mg p.o. daily.  4. Lasix 40 mg p.o. daily.  5. Potassium 20 mEq p.o. daily.  6. Cardizem 60 mg p.o. b.i.d.   PHYSICAL EXAMINATION:  VITAL SIGNS:  Blood pressure 122/82, pulse 78,  weight 168 pounds.  HEENT:  Normal.  NECK:  Supple.  CHEST:  Clear.  CARDIOVASCULAR:  Regular rate and rhythm.  ABDOMEN:  No tenderness.  EXTREMITIES:  No edema.  NEUROLOGICAL:  No gross focal findings.   STUDIES:   Electrocardiogram shows a sinus rhythm at a rate of 78.  There  appears to be Wenckebach physiology.  There is a right bundle branch  block.  There is left axis deviation.   DIAGNOSES:  1. Dizzy spells.  The etiology of this remains unclear to me.  She      certainly has conduction abnormalities including some Wenckebach      physiology and a right bundle branch block on telemetry.  However,      episode was approximately one hour, and this could make      bradyarrhythmia less likely.  I will discontinue her Cardizem for      now, then we will continue with her Lopressor.  I will schedule her      to have another CardioNet monitor to see if we can identify any      rhythm disturbances causing her problems.  She  may need a pacemaker      in the future.  She also states that her symptoms began when she      looked up.  I wonder whether she maybe had some vertebral basilar      insufficiency.  We will check carotid Dopplers and refer her to      Neurology as well.  I will see her back in approximately four weeks      to review her symptoms .  2. Coronary artery disease.  She will continue on her Coumadin and      Lopressor.  She has not tolerated statins in the past.  3. History of thoracic aortic aneurysm.  4. History of aortic valve replacement.  5. History of paroxysmal atrial fibrillation.  She will continue on      Coumadin with goal INR of 2-3.  She will also continue on her      Lopressor.  We will need to follow her closely, and if she becomes      unsteady or has problems falling, then we will need to consider      discontinuing her Coumadin.  6. Hypertension.  Her blood pressure was recently elevated, but it is      controlled today.  7. Hyperlipidemia.  She has not tolerated statins in the past.  8. History of CVA.  9. I will see her back in four weeks.     Madolyn Frieze Jens Som, MD, [facility]  Electronically Signed    BSC/MedQ  DD: 01/19/2007  DT: 01/19/2007  Job #: [id number]

## 2010-08-05 NOTE — Assessment & Plan Note (Signed)
[facility]                            CARDIOLOGY OFFICE NOTE   NAME:[name], [name]                          MRN:          [id number]  DATE:09/22/2006                            DOB:          1923/12/02    [name] is a pleasant 77 year old female who has a history of coronary  disease, status post coronary bypassing graft, aortic valve replacement  with a pericardial tissue valve, and thoracic aortic aneurysm repair in  2003.  She also has paroxysmal atrial fibrillation.  She recently had  atypical chest pain and we scheduled her for an outpatient Myoview which  was performed on September 02, 2006.  Her LV function was normal with an  estimated ejection fraction of 76%.  There was no ischemia or  infarction.  Her most recent echocardiogram was performed on March 24, 2006, her LV function was normal.  There was moderate LVH.  There was no  significant aortic insufficiency.  There was mild mitral regurgitation  and tricuspid regurgitation.  She was recently admitted to [facility] with a presyncope episode.  She was discharged and has a  CardioNet monitor in place now.  Since that time she has not had any  chest pain, shortness of breath, pedal edema, or further presyncopal  episodes.   MEDICATIONS:  1. Metoprolol 50 mg p.o. b.i.d.  2. Coumadin as directed.  3. Omeprazole.  4. Lasix 40 mg p.o. daily.  5. Potassium 20 mEq p.o. daily.  6. Cardizem 120 mg p.o. daily.   PHYSICAL EXAMINATION:  Shows a blood pressure of 143/82 and her pulse is  73.  She weighs 173 pounds.  HEENT:  Normal.  NECK:  Supple.  CHEST:  Clear.  CARDIOVASCULAR:  Reveals a regular rate and rhythm.  There is a 2/6  systolic murmur at the left sternal border.  ABDOMINAL:  Exam is benign.  EXTREMITIES:  Shows no edema.   Her electrocardiogram shows a sinus rhythm with a first degree AV block  and right bundle branch block.   DIAGNOSES:  1. Recent presyncopal episode -  etiology unclear.  She did say that      she was dizzy for approximately 15-20 minutes and this seems to be      less consistent with a cardiac arrhythmia.  However, we will      continue with her CardioNet monitor to rule out sick sinus      syndrome.  2. Coronary artery disease status post coronary bypassing graft - she      will continue on her Coumadin, Lopressor, and Cardizem.  NOTE SHE      HAS NOT TOLERATED STATINS IN THE PAST.  3. Status post thoracic aortic aneurysm repair.  4. Paroxysmal atrial fibrillation - she will continue on Coumadin with      a goal INR of 2-3 as well as Lopressor and Cardizem for rate      control if her atrial fibrillation recurs.  5. Hypertension - her blood pressure appears to be reasonably well-      controlled today.  6. Hyperlipidemia.  7. History of cerebrovascular accident.   We will see her back in 4-6 weeks.     Madolyn Frieze Jens Som, MD, [facility]  Electronically Signed    BSC/MedQ  DD: 09/22/2006  DT: 09/22/2006  Job #: [id number]

## 2010-08-05 NOTE — Discharge Summary (Signed)
NAME:  [name], [name]                   ACCOUNT NO.:  [id number]   MEDICAL RECORD NO.:  [id number]          PATIENT TYPE:  OBV   LOCATION:  3730                         FACILITY:  [facility]   PHYSICIAN:  Bevelyn Buckles. Bensimhon, MDDATE OF BIRTH:  1924-01-19   DATE OF ADMISSION:  09/07/2006  DATE OF DISCHARGE:  09/08/2006                               DISCHARGE SUMMARY   DISCHARGING PHYSICIAN:  Dr. Nicholes Mango.   PRIMARY CARDIOLOGIST:  Dr. Olga Millers.   PRIMARY CARE:  Dr. Alroy Dust.   DISCHARGE DIAGNOSIS:  1. Presyncope/palpitations suspecting paroxysmal atrial      fibrillation/sick sinus syndrome.  The patient being discharged      home with arrangements for placement of a 21-day cardiac monitor to      be placed within the next 24 hours.  2. Right bundle branch block.  3. Known coronary artery disease status post cath.   PAST MEDICAL HISTORY:  1. Paroxysmal atrial fibrillation on chronic Coumadin therapy.  2. Coronary artery disease status post CABG in 2003 with a vein graft      to the PDA.  3. Status post tissue aortic valve replacement 2003.  4. Status post thoracic aortic aneurysm repair 2003.  5. Most recent echocardiogram in January of this year EF 62%.  6. History of CVA in 2004.  7. Hypertension.  8. Hyperlipidemia.  9. History of small cecal AVM  10.Diverticulosis.  11.History of anemia.  12.Mild MR.  13.Polymyalgia rheumatica to  14.History of pneumonia January 2008, status post thoracentesis  15.History of vertigo.  16.Mild dimension.  17.History of anxiety.  18.Osteoarthritis.  19.Osteoporosis.   HOSPITAL COURSE:  [name] is a very pleasant 77 year old female with  medical history as stated above who was just discharged here June 10  after presenting with palpitations of chest discomfort.  At that time,  the patient was set up for outpatient stress Myoview.  The patient  states this was done recently.  The  results are not available at this  time.  The  patient returned to the emergency room this admission  complaining of palpitations again.  Her D-dimer was mildly elevated and  the patient had a chest CT done.  CT of the chest showed no evidence of  acute pulmonary thromboembolism.  Portable chest x-ray, no CHF, mild  cardiomegaly.  The patient complained of episode of near syncope with  the sensation of palpitations associated with weakness and nausea.  Symptoms lasted around 10 minutes.  Over the last 3 weeks, the patient's  palpitations have been somewhat fast and sometimes slow.  She gets  lightheaded with these.  At recent discharge on June 10, the patient was  started on diltiazem.  Norvasc was discontinued.  [name] states  compliance with her medications.  She was admitted for 23-hour  observation.  EKG showed sinus rhythm with no arrhythmias.  No further  episodes of presyncope or palpitations.  Cardiac markers negative x3.   PHYSICAL EXAMINATION:  Unremarkable.   DISPOSITION:  Patient being discharged home after seen by Dr. Gala Romney  on day of  discharge.   LABORATORY DATA:  At time of discharge, PT 24.9 with INR 2.1.  TSH  1.476.   MEDICATIONS AT TIME OF DISCHARGE:  1. Risperdal 4 mg daily.  2. Metoprolol 50 b.i.d.  3. Prilosec 20 daily.  4. Diltiazem CD 120 daily.  5 . Potassium 20 mEq daily.  1. Coumadin as previously prescribed being 5 mg on Monday, Wednesday,      Friday, Saturday and Sunday, 2.5 on Tuesdays and Thursdays.  The      patient to call Shelby Dubin and schedule PT/INR within the next      week.  2. Furosemide 40 mg daily as previously.  3. Patient previously also on methylprednisolone 4 mg daily prescribed      by Dr. Kriste Basque.  The patient will clarify with Dr. Kriste Basque whether or      not she continues to need this medication at this time.   DISCHARGE INSTRUCTIONS:  1. Diet as previously.  2. The patient may increase her activity slowly.  She is to refrain      from driving until cleared by  Cardiology.  She has a follow-up      appointment Dr. Jens Som July 2 at 4:00 p.m.  She has an      appointment already scheduled with Dr. Kriste Basque on this coming Monday,      and our office has been notified.  Will arrange for a 21-day      cardiac monitor to be placed on the patient within the next 24      hours.  The patient will be notified by phone at home when this      monitor will be placed.   DURATION OF DISCHARGE ENCOUNTER:  Forty five minutes.      Dorian Pod, ACNP      Bevelyn Buckles. Bensimhon, MD  Electronically Signed    MB/MEDQ  D:  09/08/2006  T:  09/09/2006  Job:  [id number]   cc:   Lonzo Cloud. Kriste Basque, MD

## 2010-08-05 NOTE — H&P (Signed)
NAMEZI, SEK NO.:  [id number]   MEDICAL RECORD NO.:  [id number]          PATIENT TYPE:  IPS   LOCATION:  4033                         FACILITY:  [facility]   PHYSICIAN:  Ranelle Oyster, M.D.DATE OF BIRTH:  01/30/1924   DATE OF ADMISSION:  06/29/2008  DATE OF DISCHARGE:                              HISTORY & PHYSICAL   CHIEF COMPLAINTS:  Low back pain.   HISTORY OF PRESENT ILLNESS:  This is an 77 year old white female with  history of CAD, stroke, and dementia, who fell on June 22, 2008, with  right lower extremity weakness and onset of back pain, radiating to the  right leg.  Head CT showed chronic small vessel disease.  CT of the  abdomen showed an L1 endplate compression fracture with 4-mm  retropulsion.  The patient was evaluated by Neurosurgery, and bed rest  was recommended for 5 days followed by slow mobilization and TLSO  stabilization.  The patient had a urinalysis positive and was treated  with antibiotics as well.  The patient has had problems with pain in her  back as well as some pain too on the right foot.  She has had some  confusion, more so today than previously, as well.  After rehab  consultation, it was felt that she could benefit from an inpatient  admission and thus was brought today.   REVIEW OF SYSTEMS:  Notable for low back pain, weakness, shortness of  breath and cough which is chronic, edema, right foot pain, reflux.  Other pertinent positives are listed above and full 14-point review is  in the written H and P.   PAST MEDICAL HISTORY:  Positive for:  1. CAD with a CABG and AVR.  2. Thoracic aortic aneurysm with repair.  3. CVA.  4. Hypertension.  5. Left total hip replacement in 2004.  6. Dementia with history of psychosis.  7. Hysterectomy.  8. PAF.  9. Diverticulosis.  10.Right hip DJD.  11.CKD.  12.Anxiety disorder.  13.Anemia.  14.Left thoracentesis in 2008.  15.History of delirium.   FAMILY HISTORY:  Positive  for diabetes and stroke.   SOCIAL HISTORY:  The patient is married, lives with her husband in 1-  level house with ramp to enter.  She does not smoke or drink.  Husband  can provide some assistance at home.   ALLERGIES:  1. PENICILLIN.  2. SULFA.  3. BIAXIN.  4. IV DYE.  5. LIPITOR.  6 . CIPRO.  1. LODINE.  2. PREDNISONE.   MEDICATIONS:  Omeprazole, Norvasc, Crestor, vitamin D, metoprolol, folic  acid, Ecotrin, niacin, Celexa, and Dulcolax.   LABORATORY DATA:  Hemoglobin 13.1, white count 8.4, platelets 223.  Sodium 140, potassium 4.1, BUN 24, creatinine 1.3 with followup sodium  133.   PHYSICAL EXAMINATION:  VITAL SIGNS:  Blood pressure is 158/66, pulse 65,  respiratory rate 16, temperature 98.7.  GENERAL:  The patient is generally pleasant, comfortable, lying in bed.  She is confused.  HEENT:  Pupils equal, round, and reactive to light.  Ear, nose, and  throat exam shows  borderline dentition with pink moist mucosa.  NECK:  Supple without JVD or lymphadenopathy.  CHEST:  Clear to auscultation bilaterally without any wheezes, as well  as rales, or rhonchi.  HEART:  Mild systolic murmur.  EXTREMITIES:  No clubbing, cyanosis, or edema.  ABDOMEN:  Soft, nontender.  Bowel sounds were positive.  SKIN:  Really unremarkable except for a small area of redness over the  right heel.  NEUROLOGIC:  Cranial nerves II through XII are grossly intact.  Reflexes  are 1+ in lower extremities, 1+ to 2+ in the upper extremities today.  Sensory exam is inconsistent, I found no focal sensory deficits in the  upper of lower extremities today.  She was a bit more hypersensitive in  the right leg, but this was inconsistent at best.  Strength was 4/5 in  the upper extremities today, proximal and distal.  Lower extremity  strength was 2+ to 3/5 proximal, 4/5 at the knees, left slightly more  than right, and 3+ to 4/5 at both ankles, again left slightly more than  right as far as motor scoring goes.   Again, maybe some slight pain  inhibition there.   POST ADMISSION PHYSICIAN EVALUATION:  1. Functional deficits secondary to L1 compression fracture with      persistent pain in the back and perhaps right lower extremity.      This is in the setting of mild dementia that may have worsened here      in the hospital.  2. The patient is admitted to receive collaborative interdisciplinary      care between the physiatrist, rehab nursing staff, and therapy      team.  3. The patient's level of medical complexity and substantial therapy      needs in context of that medical necessity cannot be provided at a      lesser intensity of care.  4. The patient has experienced substantial functional loss from her      baseline.  The patient was independent essentially prior to      arrival, living with her husband.  Within the last 24 hours, she      has been min assist for transfers; min to mod assist for      ambulating, 150 feet x2; min to mod assist for basic toileting and      other self-care.  Judging by the patient's diagnosis, physical      exam, and functional history, she has a potential for functional      progress, which will result in measurable gains while in inpatient      rehab.  These gains will be of substantial and practical use upon      discharge to home in facilitating mobility and self-care.  5. Physiatrist will provide 24-hour management of medical needs, as      well as oversight of the therapy plan/treatment, and provide      guidance as appropriate regarding interaction of the 2.  6. 24-hour rehab nursing will assist in the management of the      patient's pain needs, as well as skin care management, nutrition,      medication administration, safety techniques, bowel and bladder      management, etc., with integration of therapy and concepts and      techniques as well.  7. PT will assess and treat for lower extremity strength, safety      awareness, donning and doffing  of TLSO, family education,  functional mobility and gait, as well as safety awareness.  Goals      are supervision overall.  8. OT will assess and treat for upper extremity use, ADLs, functional      mobility, and safety awareness, as well as family education, with      goals supervision to occasional min assist.  9. Case management and social worker will assess and treat for      psychosocial issues and discharge planning.  10.Team conferences will be held weekly to assess progress towards      goals and to determine barriers to discharge.  11.The patient has demonstrated sufficient medical stability and      exercise capacity to tolerate at least 3 hours of therapy per day,      at least 5 days per week.  12.Estimated length of stay is approximately 7-10 days.  Prognosis is      fair to good.   MEDICAL PROBLEM LIST AND PLAN:  1. Coronary artery disease/paroxysmal atrial fibrillation:  Continue      Lasix, Lopressor, and Crestor.  Heart rate is controlled, and the      patient seems to be compensated from a volume standpoint at this      point.  Resume aspirin for cardiac prophylaxis.  2. Hypertension:  We will follow up the patient's blood pressure on      Norvasc, Lasix, and Lopressor.  The patient is borderline today.      Certainly, pressures could be elevated due to pain spikes.  3. Dementia/anxiety:  We will continue Celexa.  Check sleep-wake      chart, also follow up electrolytes closely per below.  The patient      is displaying some more confusion today.  4. Dehydration/hyponatremia:  Continue to follow up electrolytes.      Sodium was 133 today.  We will recheck a level on Monday.  Observe      Is and Os.  5. Deep venous thrombosis prophylaxis:  Subcu Lovenox.  Observe for      signs and symptoms of bleeding and thrombocytopenia, etc.  6. Constipation:  Augment bowel regimen.      Ranelle Oyster, M.D.  Electronically Signed     ZTS/MEDQ  D:  06/29/2008  T:   06/30/2008  Job:  [id number]

## 2010-08-05 NOTE — Discharge Summary (Signed)
NAME:  [name], [name]                   ACCOUNT NO.:  [id number]   MEDICAL RECORD NO.:  [id number]          PATIENT TYPE:  INP   LOCATION:  5153                         FACILITY:  [facility]   PHYSICIAN:  Lonzo Cloud. Kriste Basque, MD     DATE OF BIRTH:  11/18/1923   DATE OF ADMISSION:  03/18/2007  DATE OF DISCHARGE:  03/29/2007                               DISCHARGE SUMMARY   FINAL DIAGNOSES:  1. Dementia with psychotic features - she had periods of confusion and      agitation with auditory hallucinations; neurologic consultation      trick by Dr. Sharene Skeans, psychiatric consultation by Dr. Horton Marshall,      adjustment in medications with improvement in behavior.  Decision      made by the family for nursing home placement.  2. History of recurrent bronchitis in the past and pneumonia with      hospitalization December 2007.  3. History of hypertension - controlled on medications.  4. Cardiac history with arteriosclerotic heart disease, valvular heart      disease, and atrial fibrillation on Coumadin.  She is followed Dr.      Jens Som for cardiology.  In 2003 she had a thoracic aortic      aneurysm repair and aortic valve replacement with a pericardial      tissue valve and one-vessel bypass at the same time.  5. Arteriosclerotic peripheral vascular disease and cerebrovascular      changes.  6. Renal insufficiency.  7. Hypercholesterolemia.  8. History of diverticulosis and hemorrhoids - her last colonoscopy in      2000 by Dr. Kinnie Scales.  9. History of urinary tract infections.  10.History of anemia.  11.Degenerative arthritis with previous left total hip replacement      July 2004 by Dr. Lequita Halt.  12.Status post hysterectomy.  13.History of intolerance to penicillin, Ceftin, Biaxin, statins and      prednisone.   BRIEF HISTORY AND PHYSICAL:  The patient is an 77 year old white female  who presented to emergency room on March 18, 2007 with altered mental  status.  She has a long history of  dementia and vascular dementia with  cerebrovascular disease.  She has had episodes of confusion, a history  of delirium and hallucinations.  Recently she had become weaker, her  legs gave way, and she was noted be more lethargic and confused.  She  had auditory hallucinations.  She was seen in the emergency room by the  emergency room physician who consulted with neurology.  She had seen Dr.  Pearlean Brownie the in the office in the past.  She had a previous MRI showing  cerebral atrophy, small vessel disease and old infarcts.  Neurology saw  the patient declined admission and requested admission by the internal  medicine for stabilization.   PAST HISTORY:  Includes recurrent bronchitis and pneumonia with  hospitalization in December 2007 with a left lower lobe infiltrate that  cleared.  She has severe cardiovascular problems with arteriosclerotic  heart disease, valvular heart disease and peripheral vascular disease.  She has atrial fibrillation is  on Coumadin.  She has hypertension  controlled on medication.  In 2003 she had a thoracic aortic aneurysm  repair and aortic valve replaced with a pericardial tissue valve.  She  had a one-vessel coronary artery bypass graft at the same time.  She has  underlying renal insufficiency in the past with creatinine in the 1.5  range previously.  She has hypercholesterolemia and is intolerant to  statins.  She has a history of diverticulosis and hemorrhoids, her last  colonoscopy in 2000 by Dr. Kinnie Scales.  She has a history of urinary tract  infections.  She has a history of anemia in the past but last hemoglobin  13 range.  She has known degenerative arthritis with a previous left  total hip replacement in July 2004 by Dr. Lequita Halt.   MEDICATIONS:  Include Coumadin, Lopressor, Lasix, potassium, Norvasc,  omeprazole.  She is intolerant to penicillin, Ceftin, Biaxin, statin  drugs and prednisone.   Physical examination showed elderly white female, slightly  lethargic but  awake and cooperative.  Blood pressure 150/80, pulse 120 and regular,  respirations 18 per minute not labored, temperature 90 degrees.  HEENT  exam was unremarkable except for dry mucous membranes.  Neck exam showed  no jugular distention, no carotid bruits, no thyromegaly or  lymphadenopathy.  Chest exam revealed decreased breath sounds  bilaterally with scattered rhonchi.  No signs of consolidation.  Cardiac  exam revealed irregular tachycardia, grade 1/6 systolic ejection murmurs  on the left sternal border.  No rubs or gallops heard.  Abdomen was soft  and nontender, slightly distended without masses.  Rectal was deferred.  Extremities showed no cyanosis, clubbing or edema.  Pulses were intact.  Neurologic exam revealed her to be slightly confused and lethargic but  there were no focal abnormalities detected.   LABORATORY DATA:  EKG showed sinus tachycardia with a right bundle  branch block pattern.  There were inferior Q-waves and nonspecific ST-T  wave changes.  Chest x-ray showed chronic unfolding of the aorta, no  significant change from previous exams.  CT of the brain showed chronic  small vessel disease but no acute abnormalities.  CBC showed hemoglobin  14.2, hematocrit 41.3, white count 5400 with a normal differential.  Sodium 141, potassium 2.9 after correction 4.3, chloride 104, CO2 of 22,  BUN 18, creatinine 1.8, blood sugar 96, total bilirubin 0.8, alk phos  93, SGOT 27, SGPT 15, total protein 6.2, albumin 3.6, calcium 8.8.  Protime 25.3 with an INR of 2.2.  CPK 159 with negative MB and negative  troponin.  Urinalysis clear.  TSH 0.98.  Sed rate 18.  Beta nitrate  peptide 93.   HOSPITAL COURSE:  Patient was admitted to the medical service.  She was  seen in consultation by Dr. Sharene Skeans in the emergency room.  He  diagnosed altered mental status with psychotic features.  There do did  not appear to be in the associated medical issues such as urinary  tract  infection, etc.  He recommended nursing home placement.  We initially  treated the patient with Seroquel as this had helped in the past and  used as needed Haldol.  Patient had difficulty with these medications  and had some paranoid ideations, resulting in psychiatric consultation  Dr. Horton Marshall on December 29.  Dr. Horton Marshall felt she had dementia with  psychotic features and recommended changing her medication to Risperdal  and adding Depakote.  In addition he allowed for Ativan p.r.n.  agitation.  After these  recommendations were implemented, she seemed to  have some improvement but this plateaued and she still had periods of  difficulty.  The patient was seen by physical therapy, occupational  therapy and clinical social worker.  There were instrumental in working  with the family and the patient arrangements for close hospital  planning.  Decision was made for nursing home placement to aid in her  care.  This was a difficult decision for the family.   MEDICATIONS AT DISCHARGE:  1. Risperdal 0.25 mg p.o. q. a.m. and again at 3:00 p.m., and 0.5 mg      p.o. q.h.s.  2. Depakote 500 mg p.o. daily in the evening.  3. Coumadin 5 mg tablets one half tablet p.o. daily in the afternoon.      - She will need weekly protimes performed in the nursing home and      called to our Coumadin clinic.  4. Metoprolol 50 mg p.o. b.i.d.  5. Lasix 40 mg p.o. q. a.m.  6. K21 tablets p.o. daily.  7. Omeprazole 20 mg p.o. daily.  8. Norvasc 5 mg p.o. daily.  9. Tylenol 2 tablets every 4 hours as needed.  10.Senokot-S 1 tablet p.o. q.h.s. as needed for constipation.  11.Ativan  0.5 mg p.o. q.6 hours as needed for agitation.  12.Mineral oil enema as needed for constipation.   DISPOSITION:  The patient is a pending discharge to the nursing home for  ongoing rehab.   CONDITION ON DISCHARGE:  Improved.      Lonzo Cloud. Kriste Basque, MD  Electronically Signed     SMN/MEDQ  D:  03/28/2007  T:  03/28/2007   Job:  [id number]   cc:   Lonzo Cloud. Kriste Basque, MD

## 2010-08-05 NOTE — Consult Note (Signed)
NAME:  [name], [name]                   ACCOUNT NO.:  [id number]   MEDICAL RECORD NO.:  [id number]          PATIENT TYPE:  INP   LOCATION:  5153                         FACILITY:  [facility]   PHYSICIAN:  Deanna Artis. Hickling, M.D.DATE OF BIRTH:  11-27-1923   DATE OF CONSULTATION:  DATE OF DISCHARGE:                                 CONSULTATION   CHIEF COMPLAINT:  Altered mental status and a near-syncopal event.   HISTORY OF PRESENT ILLNESS:  An 77 year old right-handed married woman  who was up and suddenly experienced her legs giving way.  She acted as  if she went to sleep.  She could not be aroused initially.  She denied  any seizure-like activity.  She was brought to the [facility] emergency  room for evaluation.  I was asked to see her to determine the etiology  of her dysfunction and make recommendations for further workup and  treatment.   The patient was recently hospitalized at [facility] December 4 for  similar concerns.  She is becoming unmanageable at home.  She was seen  by my partner, Dr. Pearlean Brownie, on March 09, 2007, complaining of auditory  hallucinations, a longstanding history of intermittent delirium and  diagnosis of a vascular dementia;  however, she, is not on Aricept or  Namenda.  Mini-mental status examination 24/30, animal naming 12, clock  drawing 1.  MRI scan November 13 showed diffuse white matter changes,  remote infarctions, no acute stroke.  MRA:  No occlusive disease.  The  patient was scheduled to have a workup for treatable causes of dementia.  I do not know if that took place.   PAST MEDICAL HISTORY:  1. The patient had strokes September and November 2000.  2. Atherosclerotic cardiovascular disease.  3. Aortic valvular heart disease.  4. Hypertension.  5. Dyslipidemia.  6. Atrial fibrillation.  7. Thoracic aortic aneurysm.  8. Depression.  9. Anxiety.  10.Multiple hospitalizations for syncope and heart concerns.  11.Hospitalization for pneumonia.   PAST SURGICAL HISTORY:  1. Bilateral iridectomies.  2. Aneurysm repair.  3. Aortic valve bovine transplant with coronary artery bypass graft x2      in December 2003.  4. Total hip replacement October 16, 2002.  5. Hysterectomy, total abdominal hysterectomy.  6. Left knee replacement.   CURRENT MEDICATIONS:  1. Omeprazole 20 mg daily.  2. Furosemide 40 mg daily.  3. Metoprolol 50 mg twice daily (this was not given yesterday).  4. Warfarin 5 mg Sunday, Monday, Wednesday, Friday, 2.5 mg Tuesday,      Thursday, Saturday.  5. Diazepam 5 mg 1/2 tablet three times daily.  6. Klor-Con 20 mEq daily.  7. Amlodipine 5 mg daily.  8. Seroquel 12.5 mg.  9. Cerefolin tablets one daily.   DRUG ALLERGIES:  PREDNISONE, STATINS, COLESTID, CEFTIN, PENICILLIN,  BIAXIN.   FAMILY HISTORY:  Positive for stroke in son and her brother.   SOCIAL HISTORY:  The patient lives with husband.  No tobacco, alcohol or  drug use.   REVIEW OF SYSTEMS:  Positive for to fatigue, dizziness, generalized  weakness, stomach  pain, tinnitus, irregular heart rate.  A 12-system  review otherwise negative.   EXAMINATION TODAY:  Blood pressure 150/81, resting pulse 132,  respirations 18, temperature 97.3, oxygen saturation 98%.  EAR, NOSE AND THROAT:  No infections.  No bruits.  LUNGS:  Clear.  HEART:  No murmurs.  Pulses normal.  ABDOMEN:  Soft.  Bowel sounds normal.  No hepatosplenomegaly.  EXTREMITIES:  Normal.  NEUROLOGIC:  Catatonia.  The patient sits and stares at me.  After  awhile when I worked with her, she actually spoke some brief sentences,  telling me her name, telling me that she had not stuck out her tongue (I  had asked her to do so).  When she spoke, she did not have neologisms or  paraphasias.  She follows commands very slowly but accurately.  Cranial  nerves:  Round, reactive pupils.  Visual fields full to double  simultaneous stimuli.  She fixes and follows.  Symmetric facial  strength, midline  tongue and uvula.  Air conduction greater than bone  conduction bilaterally (she turns to localized sound).  Motor  examination:  Normal strength, tone and mass, although she gave poor  effort.  She wiggles her fingers.  Sensation:  Withdrawal x4.  Cerebellar:  Finger-to-nose, gait not tested.  Deep tendon reflexes were  diminished to absent.  The patient had bilateral flexor plantar  responses.   IMPRESSION:  1. Altered mental status, 293.0, appears functional to me.  I do not      see an organic brain syndrome other than dementia that is      longstanding.  The patient is having visual hallucinations and      auditory hallucinations.  2. The patient has an acute superimposed on a chronic renal failure.      BUN 18, creatinine 1.81, GFR 27.  On December 5 it was BUN 18,      creatinine 1.27, GFR 40.  This may certainly affect the metabolism      of her medications.  3. The patient does not have fever or increased white blood cell      count, but we need to consider pneumonia and urinary tract      infection as possible etiologies.   RECOMMENDATIONS:  The patient should be admitted by the primary service  and consider disposition to a skilled nursing facility.  I do not think  her husband is going be able to cope.   I appreciate the opportunity to participate in her care.  I do not think  further neurodiagnostic workup is needed.  If you have questions or I  can be of assistance, do not hesitate to contact me.      Deanna Artis. Sharene Skeans, M.D.  Electronically Signed     WH[facility]/MEDQ  D:  03/18/2007  T:  03/18/2007  Job:  [id number]   cc:   Lonzo Cloud. Kriste Basque, MD

## 2010-08-05 NOTE — Assessment & Plan Note (Signed)
[facility]                            CARDIOLOGY OFFICE NOTE   NAME:[name]                          MRN:          [id number]  DATE:02/16/2007                            DOB:          1923/04/23    [name] is a pleasant female whom I have followed in the past for  coronary artery disease, status post coronary artery bypass graft,  history of thoracic aortic aneurysm repair and aortic valve replacement.  She also has a history of atrial fibrillation.  When I last saw her on  January 19, 2007 she was complaining of dizzy spells.  We did note, at  that time that she had conduction abnormalities, although bradycardia  had never been documented to explain her symptoms.  I did discontinue  her Cardizem and we continued with her Lopressor.  We did schedule her  to have a cardiac event monitor, and she did state that she did have  some of her dizzy spells when she had this.  There is no documentation  of symptoms of dizziness in the report. However, she had sinus rhythm  with occasional PACs, PVCs, and one isolated 4-beat run of nonsustained  ventricular tachycardia.  We also were concerned about the possibility  of vertebrobasilar insufficiency.  I did perform carotid Dopplers on  January 20, 2007.  There were normal coronary arteries bilaterally and  the vertebral arteries were patent with antegrade flow.  I also  scheduled her to see Dr. Pearlean Brownie, and she apparently has had MRIs and is  scheduled to see him back in December.  Note, she does state that she  still has occasional dizzy spells.  They last for 15-30 minutes and are  not related to palpitations.  There is no associated chest pain or  shortness of breath.  She has not had syncope, chest pain, or increased  dyspnea on exertion.   PRESENT MEDICATIONS INCLUDE:  1. Metoprolol 50 mg p.o. b.i.d.  2. Coumadin as directed.  3. Omeprazole 20 mg p.o. daily.  4. Lasix 40 mg p.o. daily.  5. Potassium  20 mEq p.o. daily.   PHYSICAL EXAMINATION:  Today shows a blood pressure of 165/100 and a  pulse of 70.  She weighs 169 pounds.  HEENT:  Normal.  NECK:  Supple.  CHEST:  Clear.  CARDIOVASCULAR EXAM:  Regular rate and rhythm.  There is a 2/6 systolic  murmur at the left sternal border.  ABDOMINAL EXAM:  Shows no tenderness.  EXTREMITIES:  Show no edema.   Her most recent echocardiogram was performed on March 24, 2006.  At  that time her LV function was normal.  There was no aortic  insufficiency.  There was mild mitral regurgitation.  There was mild  tricuspid regurgitation.   DIAGNOSES:  1. Dizzy spells.  The etiology of [name]'s dizzy spells continue to      be unclear to me.  She did state that she had some of these while      she had her cardiac event monitor, and there is no evidence of  bradycardia.  We will continue off of her Cardizem and I will      continue with her Lopressor at its present dose.  Note, I also      wondered about vertebrobasilar insufficiency although her carotid      Dopplers do not support this.  There may be a neurological issue      and Dr. Pearlean Brownie is evaluating here and is scheduled to see her back      in December.  2. Coronary artery disease.  She will continue on her Coumadin and her      Lopressor.  She is not tolerating statins in the past.  3. History of thoracic aortic aneurysm.  4. History of aortic valve replacement.  5. History of paroxysmal atrial fibrillation.  She will continue on      her Coumadin with goal INR of 2-3 as well as on her Lopressor.  If      she has problems in the future with tachybrady syndrome, then she      would require pacemaker.  6. Hypertension.  Her blood pressure is elevated today.  Given that we      recently discontinued her Cardizem I will add Norvasc 5 mg p.o.      daily and we will increase it as needed.  7. Hyperlipidemia.  However, she has not tolerated statins in the      past.  8. History of  cerebrovascular accident.   We will see her back in approximately 3 months.     Madolyn Frieze Jens Som, MD, [facility]  Electronically Signed    BSC/MedQ  DD: 02/16/2007  DT: 02/16/2007  Job #: [id number]

## 2010-08-05 NOTE — H&P (Signed)
NAME:  SRINIKA, DELONE                   ACCOUNT NO.:  [id number]   MEDICAL RECORD NO.:  [id number]          PATIENT TYPE:  INP   LOCATION:  1610                         FACILITY:  [facility]   PHYSICIAN:  Clinton D. Maple Hudson, MD, FCCP, FACPDATE OF BIRTH:  Mar 08, 1926   DATE OF ADMISSION:  02/24/2007  DATE OF DISCHARGE:                              HISTORY & PHYSICAL   ADMITTING DIAGNOSES:  1. Altered mental status.  2. Hypertension.  3. Coumadin therapy after valve replacement.   HISTORY:  An 77 year old white female brought by family to the [facility] emergency room with complaint of 2 days of paranoia and  hallucinations.  The patient, husband and son all state that she has  been hearing church hymns which she recognizes and people talking,  although it is not clear she understands with they are saying.  She has  felt fearful that family was conspiring against her.  Something like  this happened when she had a cerebrovascular accident in 2004.  She has  been worse in the evenings for the last 2 days.  The patient states that  she has been really depressed.  She says Valium helped in the past but  she has not taken any recently and she is not on therapy for depression.  She denies any change in vision, headache, sensory or motor changes,  falls, trauma or bleeding.  The nurse described her as being quite  anxious on arrival, sitting up, apprehensive, wide-eyed, trying to get  her family to leave.  She is much better now after receiving some  Ativan.  The patient now just indicates that she hopes that the family  will go home because they are tired.  The EDP had contacted Dr. Thad Ranger  on call for neurology who was to call back after finding records.  MRI  and MRA are described as showing white matter disease, small vessel  disease but MRA is basically normal.   REVIEW OF SYSTEMS:  She has had prior cataract surgery with lens  implants and says since that her eyes have felt as if they were  dry  but no diplopia or change in visual acuity recently.  Occasional minor  headache, nothing new in the last 2 days.  No change in smell or taste.  No numbness, paresthesias, focal weakness.  No bleeding.  No chest pain  or palpitations.  No change in bowel or bladder.  No leg pain or edema.   HOME MEDICATIONS:  1. Coumadin 5 mg on Monday, Wednesday, Friday and Sunday and 2.5 mg on      Tuesday, Thursday and Saturday.  2. Lasix 40 mg.  3. Potassium unknown dose, started recently.  4. Lopressor 50 mg b.i.d.  5. Omeprazole 20 mg.  6. Tylenol p.r.n.   DRUG INTOLERANCE:  PENICILLINS, PREDNISONE, STATINS, COLESTID, CEFTIN,  BIAXIN.   PAST MEDICAL HISTORY:  Congestive heart failure, hypertension, abdominal  aortic aneurysm, pneumonia, stroke, bovine heart valve replacement,  coronary artery disease, Coumadin therapy after heart valve,  hysterectomy, left knee replacement, pneumonia.   VACCINATION HISTORY:  Flu  vaccine this year, pneumonia vaccine 3 years  ago.   SOCIAL HISTORY:  No tobacco, no alcohol.  Lives with husband   FAMILY HISTORY:  Others have had strokes   PHYSICAL EXAMINATION:  VITAL SIGNS:  BP 150/72, pulse regular at 84,  room air saturation 100%, temperature 98 degrees.  GENERAL:  She is now alert, cooperative, pleasant, oriented, speaking  clearly.  She knows person, place, date and circumstance.  She is able  to tell me that she has been really depressed and to describe the  hallucinations as outlined above.  NEUROLOGICAL:  Pupils are mid position, minimally responsive.  EOMs are  intact but I can see that the fundi looked normal.  Speech is clear.  Tongue protrudes midline.  She moves her arms and legs normally with no  tremor.  HEENT/NECK:  Tongue is lightly coated.  Upper denture plate.  Lower  teeth are her own.  No carotid bruits.  No neck vein distention.  No  stridor.  CHEST:  Quiet clear lung fields.  HEART:  Sounds are regular with a crisp valve  closure sound.  No murmur  or rub.  ABDOMEN:  Soft without splenomegaly.  Bowel sounds are present.  EXTREMITIES:  Moves all extremities on command.  Normal pulses.  Strength seems appropriate.  No edema or cyanosis.  BREAST:  Not examined.  Not indicated at this time.  PELVIC:  Not examined.  Not indicated at this time.  RECTAL:  Not examined.  Not indicated at this time.   LABORATORY:  EKG shows right bundle branch block, left anterior  hemiblock, regular sinus rhythm.  Chemistry panel and CBC are  unremarkable.  Pro-time with INR is 2.0.   IMPRESSION:  1. Altered mental status with visual and verbal hallucination,      paranoia and depression now improved compared with the description      on arrival, apparently responding to Ativan.  I will make that a      standing order for the night.  2. Coumadin therapy status post valve replacement with INR 2.0.  We      will continue her home dose.  3. Hypertension, NOS, controlled.   She is being admitted to the service of Dr. Alroy Dust for further  assessment and neurologic evaluation tomorrow.      Clinton D. Maple Hudson, MD, [facility], FACP  Electronically Signed     CDY/MEDQ  D:  02/25/2007  T:  02/25/2007  Job:  [id number]   cc:   Lonzo Cloud. Kriste Basque, MD  [address]  [place]  [place] [address]

## 2010-08-05 NOTE — Assessment & Plan Note (Signed)
[name]                            CARDIOLOGY OFFICE NOTE   NAME:July, [name]                          MRN:          [id number]  DATE:11/23/2007                            DOB:          June 18, 1923    Mrs. [name] is a pleasant 77 year old female who has a history of coronary  disease status post bypassing graft, history of thoracic aortic aneurysm  repair, as well as aortic valve replacement and paroxysmal atrial  fibrillation.  I last saw her on June 09, 2007.  At that time, we did  scheduled her to have an echocardiogram.  She was found to have normal  LV function with ejection fraction of 55-60%.  There was a bioprosthetic  aortic valve with a mean gradient of 21 mmHg.  There was mild mitral  regurgitation and mild left atrial enlargement.  Since I last saw her,  she continues to state that she feels well.  There is no dyspnea, chest  pain, palpitations, or syncope.  She does have mild pedal edema and is  on Lasix for this.  She also states that her mental status has improved  and then they are decreasing her medications.   MEDICATIONS:  Include,  1. Aspirin 325 mg p.o. daily.  2. Folate.  3. Lopressor 50 mg p.o. b.i.d.  4. Norvasc 5 mg p.o. daily.  5. Lasix 80 mg p.o. daily.  6. Omeprazole 20 mg p.o. daily.  7. Citalopram and Depakote.   PHYSICAL EXAM:  VITAL SIGNS:  Blood pressure 141/77 and her pulse is 71.  HEENT:  Normal.  NECK:  Supple.  CHEST:  Clear.  CARDIOVASCULAR:  Regular rate.  There is a 2/6 systolic ejection murmur  at left sternal border.  I cannot appreciate diastolic murmur.  ABDOMEN:  No tenderness.  EXTREMITIES:  1+ edema.   Her electrocardiogram shows a sinus rhythm at a rate of 59.  There is  left axis deviation and right bundle branch block is noted.   DIAGNOSES:  1. Coronary artery disease status post coronary artery bypassing graft      - Mrs. [name] has had no chest pain.  We will continue with her  aspirin, Lopressor.  She has not tolerated statins in the past.  2. History of thoracic aortic aneurysm.  3. History of aortic valve replacement - She did have a followup      echocardiogram and she will continue with the SBE prophylaxis.  4. History of paroxysmal atrial ablation - She will continue on her      Lopressor and aspirin.  Given her history of confusion and      dementia, we have felt that she is not a Coumadin candidate.  5. Hypertension - Blood pressure appears to be adequately control.  6. Hyperlipidemia - She has not tolerated statins in the past.  7. History of cerebrovascular accident.  8. Dementia.   I will see her back in 9 months.     Madolyn Frieze Jens Som, MD, [facility]  Electronically Signed    BSC/MedQ  DD: 11/23/2007  DT: 11/24/2007  Job #: [id number]

## 2010-08-05 NOTE — Assessment & Plan Note (Signed)
[facility]                            CARDIOLOGY OFFICE NOTE   NAME:[name] [name] YAKLIN                          MRN:          [id number]  DATE:06/09/2007                            DOB:          Jul 24, 1923    HISTORY OF PRESENT ILLNESS:  The patient is an 77 year old female who  has a history of coronary artery disease status post coronary bypass  graft, history of thoracic aortic aneurysm repair as well as aortic  valve replacement and a history of paroxysmal atrial fibrillation.  Since I last saw her, she has apparently been admitted to [facility] with dementia with psychotic features.  She was having auditory  hallucinations.  She ultimately improved with this, and was discharged  to a rehab facility in [place].  She is now back at home.  Mental  status is apparently improved.  Note her Coumadin was discontinued, and  I think this is most likely related to her mental status.  Since I last  saw her, she occasionally has some dyspnea, but this does not appear to  be significantly limiting.  There is no orthopnea, PND, chest pain,  palpitations or syncope.  She has had no dizzy spells.  She does  occasionally have pedal edema.   MEDICATIONS AT PRESENT:  1. Aspirin 325 mg daily.  2. Folate.  3. Lopressor 50 mg p.o. b.i.d.  4. Protonix 40 mg daily.  5. Norvasc 5 mg daily.  6. Celexa 15 mg daily.  7. Depakote 500 mg daily h.s.  8. Risperidone 0.25 mg p.o. b.i.d.  9. Lasix 80 mg p.o. daily.   PHYSICAL EXAMINATION:  VITAL SIGNS:  Blood pressure of 140/70 and pulse  is 60.  She weighs 178 pounds.  HEENT:  Normal.  NECK:  Supple.  CHEST:  Clear.  CARDIOVASCULAR:  Regular rate and rhythm.  There is 2/6 systolic murmur  in the left sternal border.  S2 is not diminished.  ABDOMEN:  Exam shows no tenderness.  EXTREMITY:  Edema 1+.   STUDIES:  Electrocardiogram shows sinus rhythm at a rate of 60.  A prior  septal infarct cannot be  excluded.   DIAGNOSES:  1. Coronary disease status post coronary artery bypass graft.  She      will continue on her aspirin and Lopressor.  She has not tolerated      statins in the past.  2. History of thoracic aortic aneurysm.  3. History of aortic valve replacement.  She will continue the SBE      prophylaxis and was scheduled to have an echocardiogram to      reassess.  4. History of paroxysmal atrial fibrillation.  She will continue on      her Lopressor and aspirin.  Her Coumadin has been discontinued due      to her mental status which I think is appropriate.  5. Hypertension.  Her blood pressure is controlled on her present      medications.  6. Hyperlipidemia.  She has not tolerated statins in the past.  7. History of CVA.  8. Dementia.   We will see back in 6 months.     Madolyn Frieze Jens Som, MD, [facility]  Electronically Signed    BSC/MedQ  DD: 06/09/2007  DT: 06/09/2007  Job #: [id number]

## 2010-08-05 NOTE — Assessment & Plan Note (Signed)
[facility]                                 ON-CALL NOTE   NAME:[name], [name]                          MRN:          [id number]  DATE:01/19/2007                            DOB:          [birth year]    [place] CARDIOLOGIST:  Dr. Jens Som.   At 10:30 p.m. on January 19, 2007 [name] called in stating that she  took her blood pressure and it is 174/89 and that her pulse was 63.  She  states that she feels fine, specifically she is not having any chest  pain, shortness of breath, headache, nausea or vomiting.  She wanted  reassurance that it was okay to go to sleep tonight with her blood  pressure being in this range.  She states that Dr. Jens Som recently  decreased the dose of one of her blood pressure medicines because it was  also affecting her heart rate.  I advised her that as long as she is  asymptomatic that I think it is fine for her to get sleep tonight and  not feel compelled to check her blood pressure throughout the night.  If  her blood pressure continues to remain elevated further adjustments may  need to be made to her blood pressure regimen.  She understands that  further development of symptoms, including those described above, that  she is welcome to call back tonight or present to the emergency  department.     Christell Faith, MD  Electronically Signed    NDL/MedQ  DD: 01/19/2007  DT: 01/20/2007  Job #: [id number]

## 2010-08-05 NOTE — Group Therapy Note (Signed)
NAMETAYJA, MANZER NO.:  [id number]   MEDICAL RECORD NO.:  [id number]          PATIENT TYPE:  IPS   LOCATION:  4033                         FACILITY:  [facility]   PHYSICIAN:  Antonietta Breach, M.D.  DATE OF BIRTH:  1924/02/17                                 PROGRESS NOTE   Mrs. Demaree states that the volume of her hallucinations has decreased.  She is still hearing music.  None of her auditory hallucination material  is of a negative theme, it is all music that would normally be pleasant  to her if it was not there all the time.  As mentioned above, it is very  much decreased in volume.   Her hope is intact.  She has constructive future goals and interests.   REVIEW OF SYSTEMS:  NEUROLOGIC:  No stiffness or other extrapyramidal side effects.  Examination of elbows, no  Stiffness or cogwheeling.   MENTAL STATUS EXAM:  Ms. Lean is alert.  Her eye contact is good.  Her attention span is  normal.  Concentration is normal.  Her affect is slightly anxious at  baseline, but with a broad and appropriate range.  Her mood is within  normal limits.  She is oriented to all spheres.   Her speech is mildly soft, but with normal rate and prosody.  There is  no dysarthria.  Thought process is logical, coherent, goal-directed.  No  looseness of associations.  Thought content with no thoughts of harming  herself or others; hallucinations as above.  There are no delusions.  Her insight is intact.  Her judgment is intact.   ASSESSMENT:  AXIS I:  1. (293.82).  Psychotic disorder not otherwise specified, with      hallucinations improving.  2. Major depressive disorder, greatly improved.   RECOMMENDATIONS:  1. Would continue her Celexa 30 mg daily for antidepression, and      monitor for any nausea or loose stools as side effects.  2. For antipsychosis, would continue the Risperdal 0.25 mg b.i.d. with      the goal of discontinuing the Risperdal once the psychosis is  resolved, for a trial off.   If she requires maintenance Risperdal, she will need periodic abnormal  involuntary movement scales as well as hemoglobin A1c checks.   PRELIMINARY DISCHARGE PLANNING:  Outpatient psychiatric care is available at one of the clinics attached  to [facility], [place] or [facility].      Antonietta Breach, M.D.  Electronically Signed     JW/MEDQ  D:  07/22/2008  T:  07/22/2008  Job:  [id number]

## 2010-08-05 NOTE — H&P (Signed)
[name], BOGNAR NO.:  [id number]   MEDICAL RECORD NO.:  [id number]          PATIENT TYPE:  EMS   LOCATION:  ED                           FACILITY:  [facility]   PHYSICIAN:  Coralyn Helling, MD        DATE OF BIRTH:  1923-06-17   DATE OF ADMISSION:  06/22/2008  DATE OF DISCHARGE:                              HISTORY & PHYSICAL   ADMISSION DIAGNOSIS:  Lumbar 1 spinal fracture.   BRIEF HISTORY:  This is an 77 year old white female who reports she was  in her usual state of health until approximately 5 a.m. this morning.  She woke up to use the restroom.  While ambulating at home in the  hallway, had noticed that her right leg was weak and lost her balance,  fell straight back on the floor, landing on her back.  Following this  noted immediate pain and discomfort over the hip, back, radiating down  the right leg.  She was unable to ambulate following this event  therefore was brought to the emergency room for further evaluation.  Upon evaluation, CT of abdomen and pelvis demonstrated a superior  endplate compression fracture of L1.  She was currently pending  admission for pain management and further evaluation.   PAST MEDICAL HISTORY:  Is as follows:  1. Recurrent bronchitis.  2. History of pneumonia.  3. Hypertension.  4. Coronary artery disease status post CABG in 2003.  Last nuclear      stress test in June 2008 showed right bundle branch block but no      ischemia with an ejection fraction of 76%.  She also has a history      of valvular heart disease.  She is status post tissue aortic valve      replacement and thoracic abdominal aneurysm repair in 2003.  She      has had a 2D echocardiogram in March 2009 showing bioprosthetic      valve with 21-mm gradient and normal LV function.  She has a      history of atrial fibrillation with paroxysmal atrial fibrillation      noted.  Coumadin was stopped in February 2009, has been noted to be      in normal sinus  rhythm since this time.  She has had a history of      cerebrovascular accident, followed by Dr. Pearlean Brownie, normal carotids in      November 2008.  5. History of hyperlipidemia.  6. Gastroesophageal reflux disease.  7. Diverticulosis.  8. Hemorrhoids.  9. Renal insufficiency with baseline creatinine 1.1.  10.Degenerative joint disease status post left total hip replacement      in 2004.  11.Question of dementia with MRI showing atrophy and small vessel      disease, as well as old  infarcts.  12.Anxiety.  13.Anemia with baseline hemoglobin at 11.7.   ALLERGIES:  INCLUDE:  1. PENICILLIN.  2. SULFA.  3. BIAXIN.  4. IV DYE.  5. PREDNISONE.  6. LIPITOR.  7. CIPRO.  8. CODEINE.   FAMILY HISTORY:  Not applicable.   SOCIAL HISTORY:  Married, lives with husband.  Nonsmoker.  No ETOH.   REVIEW OF SYSTEMS:  HEENT:  Denies headache or dizziness.  CHEST:  Denies chest pain, shortness of breath, wheezing, cough, or any other  pertinent positives.  CARDIAC:  Denies chest discomfort, pressure, or  palpitations.  ABDOMEN:  Within normal limits.  GU:  Currently has Foley  catheter.  Denies urinary continence prior to event.  EXTREMITIES:  Currently reports marked bilateral lower extremity discomfort, right  greater than left, following fall.  Does note marked paresthesias in  addition to pain, primarily on the right.  NEUROLOGICAL:  Intact with  exception of neuromuscular abnormalities as noted above.   PHYSICAL EXAM:  Temperature 97.  Pulse rate 67.  Respirations 22.  Blood  pressure 168/68.  GENERAL:  This is a well-nourished, 77 year old female patient currently  resting in bed.  She is comfortable until the point of where she is  examined then has excruciating pain to essentially any manipulation of  lower extremities.  HEENT:  Normocephalic without JVD.  PULMONARY:  Clear to auscultation.  CARDIAC.  There is a 3/6 systolic murmur consistent with aortic  stenosis.  This radiates up to  the carotids.  ABDOMEN:  Soft, nontender.  GU: Has a Foley catheter to straight drain with clear yellow urine.  NEUROLOGICAL:  Patient is alert, oriented, and intact.  Moves all  extremities, however, does have marked bilateral lower extremity  weakness, the right is weaker than the left.  She reports both pain and  paresthesia to palpation of both extremities but the right markedly  worse.   LABORATORY DATA:  CT abdomen and pelvis demonstrates an L1 superior  endplate compression fracture with 4-mm bony retropulsion.  CT of head  without acute evidence of infarct does show older small-vessel disease.  Hip films are negative for acute fracture.   LABORATORY DATA:  Sodium 140, potassium 4.1, chloride 104, CO2 of 26,  BUN 24, creatinine 1.32, glucose 98, PTT 29, INR/PT is 13.6/1,  hemoglobin 13.1, hematocrit 38.8, white blood cell count 8.4, platelet  count 223.  Urinalysis demonstrates few bacteria.   IMPRESSION/PLAN:  1. Lumbar 1 compression fracture with resultant pain and decreased      mobility.  Plan for this is to admit for pain management and      neurosurgical evaluation.  2. Hypertension.  Plan for this is to continue home regimen consisting      of amlodipine and Lopressor.  3. History of coronary artery disease, as well as aortic valve      replacement and prior history of atrial fibrillation, currently      normal sinus rhythm.  Plan for this is to admit to telemetry unit,      continue home medication regimen, and check 12-lead EKG.  4. Fall.  Resulting in problem #1.  She is currently off Coumadin.      Dopplers of carotids in 2008 were normal.  However, given fall and      acute right-side weakness, we will repeat carotid Dopplers to rule      out reason for new cerebrovascular accident although current CT of      head does not demonstrate injury, may consider followup CT head in      the next 48 hours.  5. History of anemia.  Baseline hemoglobin noted at 11.7/12.2,       currently 13.1.  Suspect she is actually a bit  volume depleted.  For this, we will give gentle hydration.  Best      practice, we will admit to regular telemetry bed.  We will continue      proton pump inhibitors per home regimen, we will __________, and      avoid heparin or low-molecular-weight heparin pending potential      neurosurgical intervention.      Zenia Resides, NP      Coralyn Helling, MD  Electronically Signed    PB/MEDQ  D:  06/22/2008  T:  06/22/2008  Job:  [id number]

## 2010-08-05 NOTE — Consult Note (Signed)
NAME:  [name], [name]                   ACCOUNT NO.:  [id number]   MEDICAL RECORD NO.:  [id number]          PATIENT TYPE:  IPS   LOCATION:  4033                         FACILITY:  [facility]   PHYSICIAN:  Almedia Balls. Ranell Patrick, M.D. DATE OF BIRTH:  1923/05/22   DATE OF CONSULTATION:  DATE OF DISCHARGE:                                 CONSULTATION   REFERRING PHYSICIAN:  Ranelle Oyster, MD   CHIEF COMPLAINT:  Right foot pain.   HISTORY OF PRESENT ILLNESS:  The patient is an 77 year old female status  post fall several weeks ago (June 22, 2008) with onset of extreme low  back pain and right lower extremity weakness.  The patient was fully  worked up at the [facility] and was noted to have an L1  compression/burst fracture with retropulsion.  The patient was seen by  neurosurgery and was recommended for conservative treatment.  She  initially was at bedrest for 5 days, then she is now at TLSO brace, and  they were attempting to mobilize her and began having a fair amount of  right foot pain.  Due to her progressive foot pain, she was x-rayed and  now she is noted to have 2nd through 5th metatarsal fractures.  Orthopedic is consulted for definitive recommendations regarding her  foot.   PAST MEDICAL HISTORY:  1. Coronary artery disease status post CABG.  2. Aortic valve replacement.  3. History of thoracic aortic aneurysm with repair.  4. CVA.  5. Hypertension.  6. Total hip replacement on the left in 2004.  7. Hysterectomy.  8. Dementia.  9. She also has a history of diverticulosis.  10.History of right hip arthritis.  11.Anxiety disorder.  12.Anemia.  13.Delirium.   SOCIAL HISTORY:  The patient is married, lives with her husband in a one-  level house with easy access fire ramp house.  Denies tobacco or alcohol  use.  She does have assistance of her husband in home.   DRUG ALLERGIES:  PENICILLIN, SULFA, and BIAXIN.   PHYSICAL EXAMINATION:  A healthy-appearing female.   She is seated  upright in a wheelchair.  She has a TLSO brace in place.  She is not in  any acute distress.  The patient has a relatively pain free range of  motion of right hip and right knee.  She has a little bit of foot  swelling, but no gross deformity.  Toe rotation, alignment is normal.  There is no bruising or ecchymosis.  She is able to wiggle all the toes.  Skin is intact.  She is grossly neurologically intact.   Radiographs reviewed  demonstrating, nondisplaced minimally angulated 2  through 5 metatarsal neck fractures.   IMPRESSION:  Right foot pain and swelling to metatarsal neck fractures.   PLAN:  I have recommended conservative treatment for [name] including  use for postop shoe.  She was can weight bear through her heel, which  will allow her mobilization following her spine injury.  I suspect that  this way, she will get a little bit better as far as  being able to  weight bear on the foot and she will probably need that postop shoe for  about another month, getting her after 6 weeks post injury.  We will be  checking on her next week.  We are happy to see her back in the office  in 2 weeks from discharge for x-rays and for remain weightbearing as  tolerated in a postop shoe.      Almedia Balls. Ranell Patrick, M.D.  Electronically Signed     SRN/MEDQ  D:  07/04/2008  T:  07/05/2008  Job:  [id number]

## 2010-08-05 NOTE — H&P (Signed)
NAME:  [name], [name] NO.:  [id number]   MEDICAL RECORD NO.:  [id number]          PATIENT TYPE:  OBV   LOCATION:  1824                         FACILITY:  [facility]   PHYSICIAN:  Gerrit Friends. Dietrich Pates, MD, FACCDATE OF BIRTH:  1923-12-02   DATE OF ADMISSION:  08/30/2006  DATE OF DISCHARGE:                              HISTORY & PHYSICAL   PRIMARY CARE PHYSICIAN:  Lonzo Cloud. Kriste Basque, M.D.   PRIMARY CARDIOLOGIST:  Madolyn Frieze. Jens Som, M.D., [facility]   CHIEF COMPLAINTS:  Palpitations and chest pain.   HISTORY OF PRESENT ILLNESS:  [name] is an 77 year old female with a  history of paroxysmal atrial fibrillation and CAD.  She has noticed  increased frequency of her episodes recently, but the episodes have  lasted no more than an hour.  She saw Dr. Kriste Basque last week for an upper  respiratory infection and received Levaquin and Mucinex.  Her coughing  and other symptoms have improved.  She ran no fever.  She has been  followed closely by the Coumadin clinic since she has been on  antibiotics and has an appointment with them in fact today.  She noticed  more frequent episodes of paroxysmal atrial fibrillation, but the  duration was not prolonged.   Yesterday Ms. Rainbow was feeling fairly well.  Last night she got up to  use the restroom and noticed her heart racing.  She stated that she did  what she usually does which was to relax and lie on the couch, and it  eventually resolved.  However, when she got up to go back to bed it  started again.  She had repeated episodes during the night and also  noticed chest pain.  She stated the chest pain reached 5/10.  It was not  associated with shortness of breath.  It actually occurred between two  episodes of palpitations, not during.  She states that normally she has  significant dyspnea on exertion and does not exert herself very much but  occasionally gets chest pain.  She cannot describe the last episode of  chest pain and cannot remember  if this episode of chest pain is similar  to prebypass symptoms.  After multiple episodes of palpitations, her  husband and she decided to call EMS and come to the emergency room.  She  states that her palpitations resolved prior to arrival in the emergency  room.  In the ER, she was noted at the potassium of 2.6 and is currently  receiving supplementation.  She is maintaining sinus rhythm at this time  and is not having any chest pain.   PAST MEDICAL HISTORY:  1. Status post aortic valve replacement with a bovine pericardial      tissue valve in September of 2003 as well as SVG to PDA.  2. CVA diagnosed in 2004.  3. Hypertension.  4. Hyperlipidemia.  5. Family history of coronary artery disease.  6. Polymyalgia rheumatica.  7. History of thoracic aortic aneurysm and repair.  8. Paroxysmal atrial fibrillation.  9. Chronic anticoagulation secondary to PAF.  10.Pneumonia in January  of 2008 associated with effusion requiring      thoracentesis and acute renal insufficiency secondary to      dehydration.  11.History of benign positional vertigo.  12.History of delirium/mild dementia/anxiety.  13.Degenerative joint disease/osteoporosis.  14.History of small cecal AVM as well as diverticulosis.  15.History of anemia.   PAST SURGICAL HISTORY:  1. She is status post cardiac catheterization as well as bypass      surgery.  2. She had thoracentesis secondary to an effusion in January of 2008.  3. She is status post left total hip replacement.  4. Status post colonoscopy.  5. Status post hysterectomy.   ALLERGIES:  SHE HAS A REPORTED ALLERGY TO CONTRAST AS WELL AS CODEINE,  ZOCOR, PENICILLIN, CEFTIN, SULFA, BIAXIN, AND PREDNISONE WHICH PRODUCED  EXTREME SWELLING AND EDEMA.   MEDICATIONS:  1. Omeprazole 20 mg daily.  2. Metoprolol 50 mg b.i.d.  3. Norvasc 5 mg daily.  4. Lasix 40 mg a day.  5. Methylprednisolone 4 mg daily.  6. Coumadin 5 mg daily except for 1/2 tablet on Tuesday  and Thursday.  7. Levaquin 750 mg daily, day 7 of 7.  8. Mucinex 2 tablets daily.   SOCIAL HISTORY:  She lives in [place] with her husband and is  retired.  She has no history of alcohol, tobacco, or drug abuse.   FAMILY HISTORY:  Her parents are both deceased, ages unknown, but her  father did have a reported history of coronary artery disease.  Three  out of four of her siblings have a history of coronary artery disease  and two of those are deceased but not at a premature age.   REVIEW OF SYSTEMS:  She denies any fevers, chills, or sweats.  She has  chronic dyspnea on exertion but is not short of breath at rest anymore.  She has not had any edema recently.  The palpitations are described  above but were not associated with any presyncope or syncope.  She is  still coughing and is still coughing up some sputum that is yellowish,  but she denies wheezing.  She has chronic arthralgias.  She has had  abdominal burning since she has been on the antibiotics.  She has also  had some nausea with the new medications.  She is a full code.  Review  of systems is otherwise negative.   PHYSICAL EXAMINATION:  VITAL SIGNS:  Temperature is 97.0, blood pressure  120/68, pulse 74, respiratory rate 16, O2 saturation 95% on room air.  GENERAL:  She is a well-developed elderly white female in no acute  distress.  HEENT:  Is normal.  NECK:  There is no lymphadenopathy, thyromegaly, or JVD noted.  She has  a murmur that radiates to her carotids.  CV:  Heart is regular in rate and rhythm with an S1 and S2 and a  systolic murmur, as noted, at the right upper sternal border.  Distal  pulses are 2+ in all four extremities, and no femoral bruits are  appreciated.  LUNGS:  She has a few rales in bases but otherwise clear.  SKIN:  No rashes or lesions are noted.  ABDOMEN:  Soft and nontender with active bowel sounds. EXTREMITIES:  There is no cyanosis, clubbing, or edema noted.  MUSCULOSKELETAL:  There  is no joint deformity or effusions and no spine  or CVA tenderness.  NEUROLOGIC:  She is alert and oriented.  Cranial nerves II-XII grossly  intact.   Chest x-ray shows COPD and  emphysema but no effusion, edema, or  infiltrate.  EKG is sinus rhythm, rate 70, with no acute ischemic  changes, and a right bundle branch is present but is old.   LABORATORY VALUES:  INR 1.9.  Point-of-care markers negative x2.  Sodium  137, potassium 2.6, chloride 100, CO2 25, BUN 8, creatinine 0.98,  glucose 99.  Hemoglobin 12.8, hematocrit 37.6, WBCs 5.6, platelets 290.   IMPRESSION:  1. Tachy palpitations in a patient with a history of paroxysmal atrial      fibrillation who is currently in sinus rhythm.  She will be      continued on metoprolol, and we will add low-dose Cardizem to her      medication regimen and hold the Norvasc.  Check a thyroid-      stimulating hormone, but this is likely to be normal.  2. Chest pain/neck and back pain.  She will be admitted, and      myocardial infarction will be ruled out.  If she remains in sinus      rhythm and has no further episodes of chest pain and her cardiac      enzymes are negative, she can follow up in the office with an      outpatient stress test.  3. Hypokalemia.  She is on chronic steroids and chronic Lasix therapy.      Upon review of the labs are available, she has a history of a      potassium of 3.2 but was not taking daily supplementation.  At this      time, she has no symptoms of volume overload although her brain      natriuretic peptide is slightly elevated.  We will hold the Lasix      and follow her volume status carefully.  If the Lasix is ever      restarted, she will likely need potassium supplementation.  Because      she is on chronic steroid therapy, we will also arrange for an      outpatient bone density scan.  Ms. Polsky is otherwise stable be      continued on her home medications.      Theodore Demark, PA-C      Gerrit Friends. Dietrich Pates, MD, [facility]  Electronically Signed    RB/MEDQ  D:  08/30/2006  T:  08/30/2006  Job:  [id number]   cc:   Lonzo Cloud. Kriste Basque, MD

## 2010-08-05 NOTE — Assessment & Plan Note (Signed)
[name]                            CARDIOLOGY OFFICE NOTE   NAME:[name], [name] BELVIN                          MRN:          [id number]  DATE:11/03/2006                            DOB:          [birth year]    [name] returns for followup today.  Since I last saw her on July 2 she  has had one to two brief dizzy spells but there has been no presyncope  or syncope.  She has minimal dyspnea on exertion but has no orthopnea,  PND or pedal edema.  She has not had any further chest pain.  Note that  we did perform a monitor on her for her episodes of presyncope.  She was  found to have sinus rhythm with a first-degree AV block for the most  part.  She did have one episode of brief second-degree AV block type 1  Wenckebach that was associated with a skipped beat but there was no  presyncope.  The symptoms of lightheadedness were associated with sinus  rhythm at a rate of 78 and a first degree block and PACs.   Her medications at present include:  1. Metoprolol 50 mg p.o. b.i.d.  2. Coumadin as directed.  3. Omeprazole 20 mg p.o. daily.  4. Lasix 40 mg p.o. daily.  5. Potassium 20 mEq p.o. daily.  6. Cardizem 120 mg p.o. daily.   PHYSICAL EXAMINATION TODAY:  Shows a blood pressure of 138/72 and her  pulse is 70.  She weighs 169 pounds.  HEENT:  Normal.  NECK:  Supple.  CHEST:  Clear.  CARDIOVASCULAR:  Reveals a regular rate and rhythm.  There is a 2/6  systolic murmur at the left sternal border.  There is no diastolic  murmur.  ABDOMEN:  Benign.  EXTREMITIES:  Show no edema.   Her electrocardiogram show a sinus rhythm at a rate of 70.  There is a  right bundle-branch block.   DIAGNOSES:  1. Presyncopal episodes of uncertain etiology - she had a brief dizzy      spell with her CardioNet but this was associated with sinus rhythm      and normal heart rate.  She did have one brief episode of      Wenckebach.  I will decrease her Lopressor to 25 mg p.o.  b.i.d. and      we will follow this expectantly.  She may require a loop monitor in      the future if this continues.  2. Coronary artery disease - she will continue with her Coumadin,      Lopressor and Cardizem.  She has not tolerated statins in the past.  3. History of thoracic aortic aneurysm.  4. History of paroxysmal atrial fibrillation - she will continue on      Coumadin with a goal INR of 2-3 and she will continue on her      Lopressor and Cardizem for rate control if it recurs.  5. Hypertension - her blood pressure is reasonably well controlled.  6. Hyperlipidemia - she has not tolerated statins.  7. History of cerebrovascular accident.   We will see her back in 3 months.     [name] [name] Som, MD, [facility]  Electronically Signed    BSC/MedQ  DD: 11/03/2006  DT: 11/04/2006  Job #: [id number]

## 2010-08-05 NOTE — Discharge Summary (Signed)
NAMEROX, MCGRIFF NO.:  [id number]   MEDICAL RECORD NO.:  [id number]          PATIENT TYPE:  IPS   LOCATION:  4033                         FACILITY:  [facility]   PHYSICIAN:  Erick Colace, M.D.DATE OF BIRTH:  09/21/1923   DATE OF ADMISSION:  06/29/2008  DATE OF DISCHARGE:  07/09/2008                               DISCHARGE SUMMARY   DISCHARGE DIAGNOSES:  1. L1 compression fracture.  2. Right second through fifth metatarsophalangeal fractures.  3. Psychosis, improving.  4. Depression, improved.  5. Coronary artery disease.  6. History of dementia.   HISTORY OF PRESENT ILLNESS:  [name] is an 77 year old female with  history of coronary artery disease, CVA, dementia who fell on June 22, 2008 second to right lower extremity weakness with onset of back pain  radiating to right lower extremity.  CCT was negative for acute changes  showing chronic small vessel disease.  CT of abdomen and pelvis showed  L1 endplate compression fractures with 4-mm retropulsion.  She was  evaluated by Dr. Danielle Dess with recommendations for bed rest x5 days  followed by slow mobilization and TLSO.  The patient was noted to have  positive UA and has been treated with antibiotics.  She was noted to  have improvement in right lower extremity pain, continues to have some  right foot discomfort with question of neuropathy, decreased sensation.  Some confusion is reported from earlier this a.m.  The patient was  evaluated by Rehab and he felt that she would benefit from inpatient  rehab program.   PAST MEDICAL HISTORY:  1. Coronary artery disease with CABG and AVR.  2. Thoracic aortic aneurysm.  3. CVA.  4. Hypertension.  5. Left total hip replacement in 2004.  6. Dementia with history of psychosis in the past.  7. Hysterectomy.  8. PAF.  9. Diverticulosis.  10.Right hip DJD.  11.Chronic kidney disease.  12.Anxiety disorder.  13.Anemia.  14.Left thoracocentesis in 2008.  15.History of delirium that has resolved for the past year.   ALLERGIES:  PENICILLIN, SULFA, BIAXIN, IV DYE, LEXAPRO, CIPRO, CODEINE,  and PREDNISONE.   REVIEW OF SYMPTOMS:  Positive for chronic shortness of breath and cough,  history of reflux, currently with lumbago and some right foot pain,  history of frequency and nocturia.  Family history is positive for DM  and CVA.   SOCIAL HISTORY:  The patient is married, lives with husband in one level  home with ramp at entry.  Does not use any tobacco or alcohol.  Husband  is supportive and can provide supervision past discharge.   FUNCTIONAL HISTORY:  The patient was independent prior to admission, use  cane occasionally.  She drives occasionally.   FUNCTIONAL STATUS:  The patient is min assist for transfers, min-to-mod  assist ambulating 150 feet x2 with assist to guide walker, min to mod  assist for toileting.   PHYSICAL EXAMINATION:  VITAL SIGNS:  Blood pressure 158/66, pulse 65,  respiratory rate 16, temperature 98.7.  GENERAL:  The patient is generally pleasant, well-nourished, well-  developed female.  She is currently confused.  HEENT:  Pupils are equal, round, and reactive to light.  Ear, nose, and  throat exam showed borderline dentition with moist oral mucosa.  NECK:  Supple without JVD or lymphadenopathy.  CHEST:  Clear to auscultation bilaterally without wheezes, rales, or  rhonchi.  HEART:  Regular rate rhythm with mild systolic murmur.  EXTREMITY:  No evidence of clubbing, cyanosis, or edema.  ABDOMEN:  Soft, nontender with positive bowel sounds.  SKIN:  Some small area of redness in right heel, otherwise unremarkable.  NEUROLOGIC:  Cranial nerves II-XII were grossly intact.  Reflexes 1+ in  lower extremity, 1-2+ in upper extremity.  Sensory exam is inconsistent.  No focal sensory deficits.  She is a bit more hypersensitive in right  lower extremity, which is very inconsistent at best.  Strength is 4/5 in  upper  extremity proximal and distal.  Lower extremity strength is 2+ to  3/5 proximal, 4/5 at knee less likely more than right, 3+ to 4/5 both  ankles with less likely more than right for motor scoring, may have some  slight pain inhibition there.   HOSPITAL COURSE:  [name] was admitted to Rehab on June 29, 2008  for inpatient therapies to consist of PT and OT at least 3 hours 5 days  a week.  Past admission physiatrist rehab, RN, and therapy team have  worked together to provide customized collaborative interdisciplinary  care.  Rehab RN has been assisting by managing the patient's skin for  prevention of wound care and have also been working with the patient and  bowel and bladder program.  The patient was noted to have some confusion  at time of admission.  Family reported the patient with history of  psychosis in the past and were felt that this might be coming on.  The  patient was started on low-dose Risperdal 0.25 mg nightly.   Labs were done on July 02, 2008 revealing sodium 137, potassium 4.1,  chloride 98, CO2 31, BUN 27, creatinine 1.3, glucose 95.  CBC revealed  hemoglobin 11.8, hematocrit 34.7, white count 5.5, platelets 244.  Rehab  RN has been assisting by toileting the patient q.4 h. for maintenance of  continence.  PVRs were also checked x2 shown volumes in 20-30 range.  The patient with history of constipation and was set on bowel program  with MiraLax q.a.m. and Senokot nightly.  Neurosurgery was contacted  regarding allowing the patient to don brace at the edge of the bed and  they said this would be appropriate as long as patient with maintaining  strict back precautions.  The patient's blood pressures were monitored  on b.i.d. basis during this stay.  These have been well controlled  ranging from 120s to 130s systolics and 60s to 80s diastolic.  The  patient did continue to have issues with right foot pain.  X-rays of  right foot done showed subacute fracture of  the second, third, fourth,  and fifth distal metatarsal and probable medial sesamoid bone at first  MTP joint.  Dr. Ranell Patrick with Orthopedics was consulted for input.  They  recommended postop shoe with nonweightbearing on right forefoot  weightbearing on heel only.  They will follow up with the patient on  outpatient basis.   On July 04, 2008, the patient's husband reported that the patient was  having issues with increased hallucinations for approximately 24 hours.  The patient was in the room noted to be anxious and teary.  She reported  hearing voices, talking as well as music in the background.  The patient  was treated with additional dose of Risperdal 0.5 mg and Dr. Jeanie Sewer  was contacted for input and assist.  Dr. Jeanie Sewer felt the patient with  psychotic disorder.  He concurred with keeping the patient on 0.25 mg  Risperdal on b.i.d. basis.  The patient is reporting decrease at time of  discharge.  The patient has had resolution in voice hallucination.  She  reports decreased volume of music and none that negative.  She felt her  hope was solid with good energy levels.  The patient is to follow up on  Risperdal on b.i.d. basis and to follow up with Dr. Donell Beers for further  adjustments of meds past discharge.  During patient's stay in rehab,  weekly team conference was held to monitor the patient's progress, set  goals as well as discuss barriers to discharge.  The patient's issues  with pain and then discovery of fractures of toe with limitation in  weightbearing would noted to be as concerns for barriers to discharge.  At time of admission, the patient was noted to be at mod assist to get  to the edge of the bed, min assist for sit to stand, min assist to  ambulating 180 feet x2.  This was when she was weightbearing as  tolerated on her right foot.  With nonweightbearing status, she did  require some extension of her length of stay.  By time of discharge, she  had progressed  to being at standby assist for ambulating 240 feet with a  rolling walker.  PT has been working with the patient in upper extremity  group to help with truncal strengthening as well as range of motion  exercises as to increase upper extremity strength and functional  mobility.  A family education was done with the patient and her husband  to involve all mobilities including transfers and ambulation as well as  stair navigation and car transfers.  Currently, the patient is at  supervision level to navigate four stairs and supervision level for car  transfers.  OT evaluation revealed the patient at min assist for self  care.  She was able to use Adaptic equipment such as reacher for the  dressing on lower body.  She required assist to don and doff TLSO.  ADLs  for retraining has focused on activity tolerance, use of adaptive  equipment as well as maintenance of back precautions.  Currently, the  patient requires mod assist to don and doff TLSO and her balance has  been educated regarding donning and doffing of brace as well as the  Darco shoe.  Currently, the patient is at supervision level for bathing,  supervision level for upper body dressing, min assist for lower body  dressing, and supervision for toileting.  She does require mod assist to  don brace.  Husband has been educated regarding assistance needed and he  shows good understanding of safety and the patient needs.  The patient  will continue to receive further followup home health PT, OT as well as  RN by [facility].  On July 09, 2008, the patient is  discharged to home.   DISCHARGE MEDICATIONS:  1. Crestor 5 mg nightly.  2. Omeprazole 20 mg a day.  3. Lopressor 50 mg b.i.d.  4. Norvasc 5 mg a day.  5. Furosemide 40 mg a day.  6. Folic acid 1 mg a day.  7. Os-Cal 500 mg b.i.d.  8. Senokot-S one p.o. nightly.  9. MiraLax 17 g in 8 ounces per day.  10.Ecotrin 325 mg a day.  11.Vitamin D 50,000 units once a  week.  12.Celexa 20 mg one and half per day.  13.Ultram 50 mg q.6 h. p.r.n. pain.  14.Risperdal 0.25 mg b.i.d.  15.Tylenol as needed for pain.   DIET:  Low-fat.   ACTIVITY LEVEL:  24-hour supervision.   DISCHARGE INSTRUCTIONS:  No alcohol, no smoking, no driving.  No  strenuous activity via brace at the edge of the bed.  No weight on right  forefoot.  May weightbear through right heel.   FOLLOW UP:  The patient is to follow up with Dr. Wynn Banker as needed,  follow up with Dr. Danielle Dess for routine check in 2 weeks, follow up with  Dr. Ranell Patrick in 2 weeks, follow up with Dr. Kriste Basque in 2-3 weeks, and follow  up with Dr. Donell Beers in 2 weeks for further input regarding Risperdal  titration.      Greg Cutter, P.A.      Erick Colace, M.D.  Electronically Signed    PP/MEDQ  D:  07/09/2008  T:  07/10/2008  Job:  [id number]   cc:   Lonzo Cloud. Kriste Basque, MD  Almedia Balls Ranell Patrick, M.D.  Archer Asa, M.D.  Stefani Dama, M.D.

## 2010-08-08 NOTE — Op Note (Signed)
   NAME:  [name], [name]                             ACCOUNT NO.:  [id number]   MEDICAL RECORD NO.:  [id number]                   PATIENT TYPE:  INP   LOCATION:  3017                                 FACILITY:  [facility]   PHYSICIAN:  Marlan Palau, M.D.               DATE OF BIRTH:  [birth year]   DATE OF PROCEDURE:  DATE OF DISCHARGE:                                 OPERATIVE REPORT   HISTORY:  This is a 77 year old patient with multiple diffuse  cerebrovascular infarcts and progressive dementing illness.  The patient is  being evaluated for the source of the above changes.   Lumbar puncture was performed with the patient in the fetal position on the  left side.  The low back was cleaned with Betadine solution and  approximately 2 mL of 1% Xylocaine was used as a local anesthetic.  A 20-  gauge spinal needle was inserted into the L3-4 interspace and approximately  20 mL of clear, colorless spinal fluid was removed for testing.  Opening  pressure was 140 mmH2O.  Tube #1 was sent for protein, glucose.  Tube #2 was  sent for cell count.  Tube #3 was sent for fungal smear, TB smear.  Tube #4  was sent for cytology.  There were no complications to the above procedure.  The patient tolerated the procedure well.                                               Marlan Palau, M.D.    CKW/MEDQ  D:  01/27/2003  T:  01/27/2003  Job:  [id number]

## 2010-08-08 NOTE — Discharge Summary (Signed)
NAME:  [name], [name]                             ACCOUNT NO.:  [id number]   MEDICAL RECORD NO.:  [id number]                   PATIENT TYPE:  INP   LOCATION:  3025                                 FACILITY:  [facility]   PHYSICIAN:  Lonzo Cloud. Kriste Basque, M.D. [facility]            DATE OF BIRTH:  Feb 06, 1924   DATE OF ADMISSION:  11/25/2002  DATE OF DISCHARGE:  12/02/2002                                 DISCHARGE SUMMARY   FINAL DIAGNOSES:  1. Admitted November 25, 2002 with confusion and MRI brain showing atrophy.     Small vessel disease, and multiple white matter infarcts.  Neuro consult     Dr. Pearlean Brownie and the stroke team with evaluation failing to reveal any     source of emboli with negative carotid Dopplers, negative transthoracic     echo, negative transesophageal echo, and MRI as above.  Patient treated     with heparin and converted to Aggrenox per recommendations of neurology.     Confusion cleared, no neurologic deficits at discharge.  2. Arteriosclerotic heart disease and arteriosclerotic peripheral vascular     disease with previous thoracic aortic aneurysm repair and aortic valve     replacement with a tissue valve, September 2003 by Dr. Tyrone Sage.  She had     a one vessel coronary artery bypass graft at the same time.  Evaluation     this hospitalization including transthoracic echo showing no significant     problem with the bioprosthetic aortic valve, and transesophageal echo     showing no vegetations on the valve.  She had one of four blood cultures     positive for multiple species believed to be a contaminant.  3. History of hypertension with Norvasc added this admission and blood     pressure 140/70 at discharge.  4. Remote history of paroxysmal atrial fibrillation (postoperative period     only) and normal sinus rhythm with no significant arrhythmias during this     hospitalization.  5. History of hyperlipidemia on Zetia.  6. History of diverticulosis and hemorrhoids.  7. History  of mild anemia, hemoglobin 10.7 at discharge.  8. Degenerative arthritis with recent left total hip replacement by Dr.     Lequita Halt in July of 2004.  9. History of osteoporosis.  10.      History of urinary tract infections.  11.      History of polymyalgia rheumatica.   BRIEF HISTORY AND PHYSICAL:  The patient is a 77 year old white female,  known to me with multiple medical problems.  She presented to the emergency  room  on November 25, 2002 with confusion getting worse over the several  days prior to admission.  Husband felt that she was not quite herself.  The  confusion progressed to where she would not recognize family members and was  talking nonsensically.  An MRI showed atrophy, small vessel disease, and  multiple bilateral white matter watershed infarcts.  Then she was admitted  for further evaluation and treatment to rule out the possibility of a  cardiac source of emboli.  There was no previous history of stroke.  The  patient was taking aspirin daily for prophylaxis.   PAST MEDICAL HISTORY:  1. She has a history of thoracic aortic aneurysm repair and aortic valve     replacement in September of 2003 by Dr. Tyrone Sage.  He used a tissue     valve.  She also had a one vessel coronary bypass at the same time, and     she has been followed by cardiology in the office.  Postoperatively she     had a brief episode of atrial fibrillation and was on Coumadin for     several months and this was discontinued in follow-up by cardiology.  2. She has a history of hypertension controlled on medications.  3. She has a history of hyperlipidemia controlled on Zetia.  4. She has a history of diverticulosis and hemorrhoids.  5. She has had mild anemia which has been followed.  6. She has a history of degenerative arthritis and had a recent left total     hip replacement by Dr. Lequita Halt in July of this year.  7. She has underlying osteoporosis as well.  8. She has had urinary tract infections  in the past.  9. She has a remote history polymyalgia rheumatica, and has seen     rheumatology.   PHYSICAL EXAMINATION:  GENERAL:  Revealed a 77 year old white female in no  acute distress, obviously confused, but no focal neurologic abnormalities.  VITAL SIGNS:  Blood pressure 152/764, pulse 70 and regular, respirations 14  per minute, not labored, temperature 98 degrees.  HEENT:  Exam unremarkable.  NECK:  No jugular venous distention, no carotid bruits, no thyromegaly or  lymphadenopathy.  CHEST:  Clear to percussion and auscultation.  CARDIAC:  Revealed a regular rhythm, normal S1, loud S2, grade 2/6  holosystolic murmur in the left sternal border.  No rubs or gallops heard.  ABDOMEN:  Soft and nontender, normal bowel sounds.  No evidence of  organomegaly or masses.  EXTREMITIES:  Showed some mild venous insufficiency and trace pretibial  edema.  NEUROLOGIC:  Exam was intact without focal abnormalities.  She was confused  as noted.   LABORATORY DATA:  EKG showed normal sinus rhythm, nonspecific ST/T-wave  changes.  Cardiac monitor revealed normal sinus rhythm.  There were no  significant arrhythmias.  Transthoracic 2-D echo showed normal left  ventricular function with ejection fraction between 55 and 60%, the study  was inadequate for the evaluation of regional wall motion.  There was a  bioprosthetic aortic valve with no significant perivalvular aortic  regurgitation.  There was a mean transthoracic valve gradient of 11 mmHg.  The left atrium was dilated.  There was good seeding of the valve and no  vegetations were noted.  A subsequent transthoracic echo ordered by Dr.  Pearlean Brownie and performed by Dr. Andee Lineman showed some concentric left ventricular  hypertrophy; normal left ventricular contractions; left atrial enlargement;  normal left atrial appendage with no thrombus; a well seeded bioprosthetic valve in the aortic position with no perivalvular leak and no vegetations  seen.   There was no source of embolus or vegetations noted.  MRI of the  brain on admission showed atrophy and small vessel disease.  There were  multiple bilateral white matter watershed infarcts.  The MRA showed  a  question of some diffuse disease in the right internal carotid within the  brain.  There was no obvious branch occlusion or aneurysm.  Carotid Doppler  showed no evidence of significant internal carotid stenosis.  Vertebral  artery flow was antegrade.   Hemoglobin 11.5, hematocrit 34.5, white count 4700 with a normal  differential.  Serial CBCs were followed throughout her hospital course.  Hemoglobin 10.7 at discharge.  Pro time 14.4, INR 1.2.  PTT monitored by  pharmacy with heparin drip during the hospitalization.  Sodium 139,  potassium 3.8, chloride 111, CO2 25, BUN 12, creatinine 1, blood sugar 77,  calcium 8.4, total protein 5.8, albumin 3.3.  AST 25, ALT 12, alkaline  phosphatase 104, total bilirubin 1.  Hemoglobin A1c 5.3.  Homocystine 13.73,  fasting cholesterol 145, triglycerides 147, HDL 38, LDL 78.  C reactive  protein 5.1 which is elevated.  Urinalysis clear.  Blood cultures:  Out of  four cultures one was positive for multiple species, including a  Streptococcus group D, Stomatococcus, and Strep viridans.  All other  cultures were negative and this was felt to be a contaminant by the ID  consultants.   HOSPITAL COURSE:  The patient was admitted with confusion and an MRI that  showed multiple watershed infarcts.  She was placed on IV heparin and  neurology was consulted along with the stroke team.  The patient was seen by  Dr. Pearlean Brownie who recommended studies above.  Because of her bioprosthetic  valve, he wanted transthoracic and a transesophageal echo.  These were  negative with no source of emboli identified.  Carotid Dopplers as well  showed no significant stenosis.  Her mental status improved and she was  switched to p.o. Aggrenox one capsule twice a day per the  directions of Dr.  Pearlean Brownie and the stroke consult.  Her blood pressure was carefully monitored  during the hospital course and Norvasc was added to her Toprol and Cozaar  which she was taking at home.  She ambulated with the stroke team who felt  that she was MHB and ready for discharge on December 02, 2002.   DISCHARGE MEDICATIONS:  1. Aggrenox one capsule p.o. b.i.d.  2. Toprol XL 100 one pill p.o. daily.  3. Cozaar 100 mg p.o. daily.  4. Zetia 10 mg p.o. daily.  5. Norvasc 5 mg p.o. daily.  6. Ativan 0.5 mg p.o. t.i.d. as needed for nerves.  7. Laxative of choice as needed.  8. Tylenol as needed for pain.   The patient will continue home therapy and was instructed on a low salt, low  fat diet.   FOLLOW UP:  She will follow up in the office on Friday, December 08, 2002,  at 12 noon.   CONDITION ON DISCHARGE:  Improved.                                                  Lonzo Cloud. Kriste Basque, M.D. [facility]    SMN/MEDQ  D:  12/02/2002  T:  12/03/2002  Job:  [id number]

## 2010-08-08 NOTE — H&P (Signed)
NAME:  [name]                             ACCOUNT NO.:  [id number]   MEDICAL RECORD NO.:  [id number]                   PATIENT TYPE:  INP   LOCATION:  1828                                 FACILITY:  [facility]   PHYSICIAN:  Pramod P. Pearlean Brownie, MD                 DATE OF BIRTH:  1924-03-18   DATE OF ADMISSION:  01/24/2003  DATE OF DISCHARGE:                                HISTORY & PHYSICAL   REASON FOR ADMISSION:  Behavioral agitation.   HISTORY OF PRESENT ILLNESS:  The patient is a 77 year old lady who is well  known to me from recent hospital admission, as well as office visits.  [name]  was hospitalized on November 25, 2002, for the sudden onset of confusion and  altered mental status.  An MRI scan of the brain showed multiple deep white  matter, subcortical infarction, as well as micro-angiopathic changes.  [name]  underwent an extensive stroke risk stratification workup, which was as  follows:  An MRA and carotid arteriograms did not reveal significant  vascular stenosis.  A transesophageal echocardiogram did not show any  obvious cardiogenic source of embolism or vegetations, and 1/4 blood  cultures was positive, but it showed a mixed culture which was thought by an  infectious disease consultation to be a contaminate.   Her mental status had improved at the time of discharge, and [name] was placed  on Aggrenox for secondary stroke prevention, as well as on medications for  her hypertension and hyperlipidemia.  [name] was seen in the office by me twice  in the last one month with worsening mental status changes, as well as  hallucinations and episodic behavior disturbances.  I put her on Risperdal  0.25 mg t.i.d., which was gradually increased.  This resulted in an  improvement in her hallucinations; however, [name] continues to have episodes  of agitation.  This occurs at a frequency of 2-3 times a week, when [name] gets  to be quite obstinate with her behavior, and does not listen to her  husband,  and tends to walk away, and in fact has wandered off once.  [name] has had to  resort to keeping her under lock and key when [name] is away.  At times [name] is  quite lucid and quite well receptive and can carry on an appropriate  conservation.  [name] has had no recent headaches, focal extremity weakness,  gait, or balance problems.   PAST MEDICAL HISTORY:  1. Hypertension.  2. Hyperlipidemia.  3. Diverticulosis.  4. Mild anemia.  5. Osteoporosis.  6. Recurrent urinary tract infections.  7. Polymyalgia rheumatica.  8. Ischemic heart disease.   PAST SURGICAL HISTORY:  1. Hip replacement.  2. Thoracic aortic aneurysm repair with an aortic valve placement in     September 2003, by Dr. Sheliah Plane.  3. Single-vessel coronary artery bypass graft surgery.   CURRENT MEDICATIONS:  1. Aggrenox one cap b.i.d.  2. Zetia 10 mg daily.  3. Cozaar 100 mg daily.  4. Norvasc 5 mg daily.  5. Metoprolol 50 mg b.i.d.  6. Risperdal 0.25 mg t.i.d.   ALLERGIES:  LIPITOR, BIAXIN, PENICILLIN and PREDNISONE.   REVIEW OF SYSTEMS:  Significant for episodic agitation and behavioral  changes as stated above.  No recent chest pain, fever, loss of weight,  cough, diarrhea, or other illness.   PHYSICAL EXAMINATION:  GENERAL:  Reveals a pleasant elderly lady, who is not  in distress.  VITAL SIGNS:  [name] is afebrile, pulse 76 per minute and regular, respirations  16 per minute, blood pressure 130/80.  EXTREMITIES:  Distal pulses well felt.  There is no extremity rash or  deformity noted.  HEENT:  Head atraumatic.  ENT:  Unremarkable.  NECK:  Supple, without bruits.  HEART:  No murmur or gallops.  LUNGS:  Clear to auscultation.  ABDOMEN:  Soft, nontender.  NEUROLOGICAL:  [name] is awake, alert, oriented x3.  There is no facial apraxia  or dysarthria.  [name] has a diminished attention span with easy  distractibility.  Her judgment is impaired.  [name] can follow two-step  commands.  [name] has some occasional  tangential thinking and has some mild  hallucinations.  [name] does not appear to be agitated, and is quite well  behaved and quiet during my examination.  Her eye movements are full range  without nystagmus.  Pupils are 5.0 mm and reactive.  Visual acuity seems  adequate.  Visual fields appear full to confrontational testing.  Face is  symmetric bilaterally, normal.  Tongue is midline.  Motor system examination  reveals symmetric upper and lower extremities tone, reflexes, coordination,  sensation.  Plantars are both downgoing.  Her gait was not tested.   LABORATORY DATA:  The admission laboratories and a CT scan from today are  pending.  The previous hospital admission note from November 25, 2002, as  well as all the diagnostic testing was reviewed.   IMPRESSION:  This is a 77 year old lady with multiple bicerebral  infarctions, with mental status changes since then, with recurrent episodic  behavioral agitation.   PLAN:  Will admit the patient for an adjustment of her medications for  behavioral agitation.  Will check the metabolic panel, CBC, urinalysis with  C&S, as well as a CAT scan of the head and an electroencephalogram.  Increase her Risperdal to 0.5 mg q.a.m. and at night, and 0.25 mg in the  afternoon.  Add Aricept 5 mg daily for mild dementia and behavioral  agitation as well.  If the agitation continues, may consider adding low dose  Depakote management as well.   I had a long discussion with the patient's son and with her husband, in  regards to the above plan, and answered their questions.  If the patient's  behavior continues to be difficulty, [name] may need to consider an alternative  living situation, like moving to an assisted living facility.  We will  discuss this later with the family and with case management.                                                Pramod P. Pearlean Brownie, MD  PPS/MEDQ  D:  01/24/2003  T:  01/24/2003  Job:  [id number]   cc:   [name],  M.D.  [facility]

## 2010-08-08 NOTE — Discharge Summary (Signed)
NAME:  [name], [name]                             ACCOUNT NO.:  [id number]   MEDICAL RECORD NO.:  [id number]                   PATIENT TYPE:  INP   LOCATION:  0480                                 [facility]ACILITY[facility]  [facility]   PHYSICIAN:  Ollen Gross, M.D.                 DATE OF BIRTH:  1923-08-22   DATE OF ADMISSION:  10/16/2002  DATE OF DISCHARGE:  10/20/2002                                 DISCHARGE SUMMARY   ADMITTING DIAGNOSES:  1. Osteoarthritis left hip.  2. History of bronchial asthma.  3. Hypertension.  4. Coronary arterial disease.  5. Valvular heart disease, status post valve replacement.  6. History of aortic aneurysm, status post aortic aneurysm repair.  7. Diverticulitis.  8. Hemorrhoids.  9. History of urinary tract infection.  10.      Postmenopausal.  11.      Osteoporosis.   DISCHARGE DIAGNOSES:  1. Osteoarthritis left hip, status post left total knee replacement     arthroplasty.  2. Mild postoperative blood loss anemia, did not require transfusion.  3. History of bronchial asthma.  4. Hypertension.  5. Coronary arterial disease.  6. Valvular heart disease, status post valve replacement.  7. History of aortic aneurysm, status post aortic aneurysm repair.  8. Diverticulitis.  9. Hemorrhoids.  10.      History of urinary tract infection.  11.      Postmenopausal.  12.      Osteoporosis.   PROCEDURE:  Date of surgery:  October 16, 2002.  Left total knee arthroplasty.  Surgeon:  Dr. Homero Fellers Aluisio.  Assistant:  Avel Peace, P.A.C.  Spinal  anesthesia.  Blood loss 300 mL.  Hemovac drain x1.   CONSULTS:  Rehabilitation services, Dr. Riley Kill.   BRIEF HISTORY:  This is a 77 year old female seen by Dr. Lequita Halt for left  hip pain.  The pain has been ongoing for about two years now and has  progressively gotten worse.  It started to interfere with her cardiac  rehabilitation and her walking program.  She was seen in the office where x-  rays showed bone-on-bone changes with  slight collapse of the femoral head.  It was felt she had reached a point where she could benefit from undergoing  surgery.  The risks and benefits were discussed.  The patient was  subsequently admitted to the hospital.   LABORATORY DATA:  CBC on admission showed a hemoglobin of 14.2, hematocrit  of 42.1, white cell count 5.4, red cell count 4.98, differential within  normal limits.  Postoperative H&H 10.1 and 29.8.  Last noted H&H stabilized  at 10 and 29.4.  PT and PTT on admission were 12.6 and 30 respectively, with  an INR of 0.9.  Serial protimes followed.  Last noted PT/INR 16.9 and 1.6.  Chemistry panel on admission all within normal limits with the exception of  minimally elevated ALP of  121.  Serial BMETs were followed.  Electrolytes  remained within normal limits.  Glucose did go up from 70 to 133 and back  down to 125.  Calcium from 9.3, dropped to 7.8, back up to 8.  Urinalysis  did show moderate leukocyte esterase with few epithelial cells and 36 white  cells.  Blood group type O positive.   X-rays:  Chest x-ray dated October 09, 2002:  Probable negative chest for  active disease, with postoperative changes noted with prominence of the  ascending aorta felt to be similar to that reported on an outside chest film  from [facility] dated December 09, 2001.  Hip films:  Severe  osteoarthritis left hip.   HOSPITAL COURSE:  The patient was admitted to [facility], taken to  the OR, and underwent the above-stated procedure without complication.  The  patient tolerated the procedure well and was later transferred to the  recovery room and then taken to the orthopedic floor for continued  postoperative care.  Her vital signs were followed.  The patient was placed  on 24 hours of postoperative IV antibiotics, given PC analgesics for pain  control following surgery.  She was given concomitant Ancef and gentamicin  for 24 hours.  She was placed on Coumadin for DVT  prophylaxis, given partial  weightbearing status 25-50% to the left lower extremity.  PT and OT were  consulted to assist with gait-training, ambulation, and ADLs.  Hemovac drain  placed at the time of surgery was pulled on postoperative day #1.  Rehabilitation consult was called.  The patient was seen by Dr. Riley Kill  postoperatively, felt that the patient may benefit from a short inpatient  rehabilitation stay depending on her progress and mobility.  The patient's  hemoglobin remained stable.  Dressing change was initiated on day #2.  Incision was healing well.  She was weaned over to p.o. medications.  Her  PCA and IVs were discontinued by day #2.  Continued to progress well with  therapy.  She was already up ambulating greater than 125 feet by day #2 and  then over 130 feet by day #3 in the morning and 150 feet by day #3 in the  afternoon.  She did extremely well with physical therapy and felt that she  would not require an inpatient stay since she was doing so well.  Therefore,  discharge planning made arrangements for home care.  By October 20, 2002, the  patient was doing quite well.  Had been seen on rounds by Dr. Lequita Halt and  was ready to go home.   DISCHARGE PLAN:  1. The patient was discharged home on October 20, 2002.  2. Discharge diagnoses:  Please see above.  3. Discharge medications:  Coumadin, Percocet, Robaxin.  4. Diet:  Low-sodium, cardiac diet.  5. Activity:  Touchdown to partial weightbearing of 25-50%.  Hip     precautions.  Home health PT, home health nursing through [facility].  6. Follow-up:  Two weeks from surgery.  Call the office for an appointment.    DISPOSITION:  Home.   CONDITION UPON DISCHARGE:  Improved.     [name], P.A.              Ollen Gross, M.D.    ALP/MEDQ  D:  11/15/2002  T:  11/15/2002  Job:  [id number]   cc:   Ranelle Oyster, M.D.  [address]  [place] [address]  Fax: [phone]  Olga Millers, M.D.   Lonzo Cloud. Kriste Basque, M.D. [facility]

## 2010-08-08 NOTE — Discharge Summary (Signed)
NAME:  [name], [name] NO.:  [id number]   MEDICAL RECORD NO.:  [id number]          PATIENT TYPE:  INP   LOCATION:  6707                         FACILITY:  [facility]   PHYSICIAN:  Lonzo Cloud. Kriste Basque, MD     DATE OF BIRTH:  1923/10/19   DATE OF ADMISSION:  03/22/2006  DATE OF DISCHARGE:  04/02/2006                               DISCHARGE SUMMARY   FINAL DIAGNOSES:  1. Admitted March 22, 2006 with left lower lobe community-acquired      pneumonia - no organisms specified.  The patient responded slowly      to IV Zithromax and Rocephin and received a full 10 days IV during      her hospital stay.  Course complicated by parapneumonic effusion      requiring thoracentesis and atelectasis.  2. Nausea, vomiting and dehydration on admission with associated renal      insufficiency and electrolyte abnormality.  This all resolved with      IV fluids and increased p.o. intake.  3. New atrial fibrillation.  Cardiology consult suggesting adjustment      in her medications and Coumadin therapy.  The patient converted to      normal sinus rhythm with adjustment in her medications and was      started on Coumadin this hospitalization.  4. History of hypertension - medications adjusted this admission.  5. Arteriosclerotic peripheral vascular disease with previous thoracic      aortic aneurysm repair and aortic valve replacement with a      pericardial tissue valve in 2003.  She had a one vessel coronary      artery bypass graft at the same time and is followed by Dr.      Jens Som.  6. Hyperlipidemia - on Zetia.  7. History of delirium with abnormal MRI of the brain showing atrophy,      small vessel disease and old infarcts.  She has mild dementia as      well.  She had been seen by Dr. Pearlean Brownie in the past was on Aggrenox      prior to this hospitalization.  Aggrenox was changed to Coumadin      and one baby aspirin this admission.  8. History of diverticulosis and hemorrhoids.  9.  History of urinary tract infections in the past.  10.History of anemia with hemoglobin in the 10 to 11 range this      admission.  11.History of anxiety.   BRIEF HISTORY AND PHYSICAL:  The patient is an 77 year old white female  known to me with multiple problems as noted above.  She presented to  emergency room on March 22, 2006 and was admitted by Dr. Jayme Cloud in  my absence.  She complained of a 24-hour history of nausea, vomiting and  increasing dyspnea.  Chest x-ray showed a left lower lobe pneumonia.  She had low grade fever and generalized weakness.  She was hypoxemic  with an O2 sat of 85% on room air and was referred for admission.   PAST MEDICAL HISTORY:  The patient was last  seen in the office in  October, 2007 with some low back pain and was prescribed Robaxin and  tramadol.  She has known degenerative arthritis and had a previous left  total hip replacement by Dr. Lequita Halt in 2004.  She has a history of  hypertension, arteriosclerotic heart disease and arteriosclerotic  peripheral vascular disease followed Dr. Jens Som.  She had previously  been on Cozaar, Norvasc and metoprolol.  She had a thoracic aortic  aneurysm repair with an aortic valve replacement with pericardial tissue  valve in 2003 by Dr. Tyrone Sage.  She had a one vessel coronary artery  bypass graft at the same time.  On her last cardiac evaluation in June  2007 by Dr. Jens Som, she had a CT angio that was negative for acute  changes; her thoracic aorta measured 3.8 cm in its widest area.  She did  have a history of paroxysmal atrial fibrillation transiently after her  surgery in 2003.  She had a 2-D echo in 2006 and 2007 that showed no  focal wall motion abnormalities and an ejection fraction of around 60%.  She has a history of hyperlipidemia on Zetia.  She has a history of  delirium and mild dementia with an abnormal MRI showing atrophy, small  vessel disease and old infarcts.  She was hospitalized in 2004  by Dr.  Pearlean Brownie and has been on Aggrenox ever since.  She has a history of  diverticulosis and hemorrhoids.  Her last colonoscopy in 2000 by Dr.  Kinnie Scales showed extensive diverticulosis, internal  hemorrhoids, a small  cecal AV malformation and constipation.  She has history of urinary  tract infections in the past.  She has a history of mild chronic anemia.  She has a history of anxiety.   ALLERGIES:  She is intolerant to STATINS and is ALLERGIC  to PENICILLIN,  SULFA and BIAXIN.   PHYSICAL EXAMINATION:  GENERAL APPEARANCE:  Physical examination  revealed an elderly white female in mild distress.  VITAL SIGNS:  Blood pressure 94/38, pulse 90 and irregular, respirations  22 per minute, slightly labored. Temp 99 degrees. O2 sat was 91% on 2L  oxygen by nasal cannula.  HEENT:  Exam was unremarkable.  Slightly dry mucous membranes noted.  NECK:  Exam was supple.  No jugular distension, no adenopathy, no  bruits.  CHEST:  Exam revealed crackles and E to A changes at the left base  compatible with left lower lobe pneumonia.  There were no wheezes or  rhonchi.  CARDIAC:  Exam revealed irregular rhythm with a grade 1/6 systolic  ejection murmur over the left sternal border.  No rubs or gallops heard.  ABDOMEN:  Abdomen was soft and nontender without evidence organomegaly  or masses.  EXTREMITIES:  Showed venous insufficiency changes, no cyanosis, clubbing  or edema.  She has had previous knee surgery.  SKIN:  The skin was pale and turgor was poor.  NEUROLOGICAL:  Neurologic exam was intact without focal abnormalities  detected.   LABORATORY DATA:  EKG revealed atrial fibrillation and nonspecific ST-T  wave changes., follow-up EKG after conversion showed normal sinus  rhythm, poor R wave progression in V1-V3, nonspecific ST-T wave changes.  2-D echocardiogram showed normal left  ventricular function  with ejection fraction in the 60-65% range.  The study was inadequate for  evaluation of  left ventricular regional wall motion.  A bioprosthetic  aortic valve was not well visualized and clinical correlation advised.  Chest x-ray showed left lower lobe pneumonia.  Serial films showed  increasing bilateral left greater than right effusions which layered on  decubitus.  On March 29, 2006  the patient had a thoracentesis which  yielded 450 cc of serosanguineous fluid.  Analysis revealed an exudate.  Subsequent followup films showed persistent opacity at the left base  with some atelectasis.  Hemoglobin 11.8, hematocrit 35.3, white count  15,600 with a left shift.  Serial CBCs were followed throughout her  hospital course.  PT and PTT were normal.  The patient had serial  protimes once started on Coumadin and heparin levels checked as well.  Sodium 131, potassium 3.8, chloride 102, CO2 18, repeat 23, BUN 21,  creatinine 1.8, blood sugar 114, creatinine improved to 1.3, calcium  7.8. Total protein 5.2, albumin 2.5 AST 30, ALT 22, alk phos 65, total  bilirubin 1.0.  Beta natriuretic peptide 202, repeat 65.  Cholesterol  128, triglycerides 94, HDL 48, LDL 69.  TSH  3.22.  Cortisol level 43.  Blood cultures negative x2.  Thoracentesis fluid negative culture.  Strep urinary antigen negative.   HOSPITAL COURSE:  The patient was admitted by Dr. Jayme Cloud in my  absence, placed on IV Rocephin and Zithromax and she remained on these  for a 10-day course.  She was given IV fluids and her serum electrolytes  corrected.  Serial films showed persistent left lower lobe infiltrate  and increased effusions.  She was gently diuresed and fluids adjusted.  She had a left thoracentesis revealing for 450 cc of serosanguineous  exudative fluid.  She was given nebulizer treatments and incentive  spirometry.  She gradually ambulated on the ward and was felt to be MHB  and ready for discharge on April 02, 2006.   The patient had atrial fibrillation on admission.  She was seen in  consultation by Dr.  Eden Emms and [facility] cardiology service.  They did  recommend Coumadin therapy for the patient and adjusted her home  regimen.  Her previous Norvasc and Cozaar were discontinued and she was  started back on metoprolol with conversion to normal sinus rhythm.  They  felt strongly that she needed to be on Coumadin and carefully monitored  in the office.  Previous Aggrenox was discontinued as well and she was  placed on a baby aspirin a day.   DISCHARGE MEDICATIONS:  1. Coumadin 5 mg tablets, 1 tablet daily at 4:00 p.m. with Pro Time in      the office next week.  2. Metoprolol 50 mg p.o. b.i.d.  3. Lasix 40 mg p.o. daily.  4. Zetia 10 mg p.o. daily.  5. Prilosec 20 mg p.o. daily taken 30 minutes before breakfast.  6. Mucinex 2 tablets p.o. b.i.d. with plenty of fluids by mouth.  7. Enteric-coated aspirin 81 mg daily taken with food.  8. Vicodin 1 tablet every  6 to 8 hours as needed for pain.  FOLLOWUP:  The patient was given an appointment to see me in the office  on Tuesday, April 06, 2006  at 1:45 p.m.Marland Kitchen  She is instructed to call  the office for any problems.  She knows to stop her previous Aggrenox,  Cozaar and Norvasc therapy.   CONDITION ON DISCHARGE:  Improved.      Lonzo Cloud. Kriste Basque, MD  Electronically Signed     SMN/MEDQ  D:  04/02/2006  T:  04/02/2006  Job:  [id number]

## 2010-08-08 NOTE — Assessment & Plan Note (Signed)
[facility]                            CARDIOLOGY OFFICE NOTE   NAME:Kirtz, JOZALYNN NOYCE                          MRN:          [id number]  DATE:04/21/2006                            DOB:          07/03/1923    Mrs. Walling returns for followup today.  She was recently admitted to  [facility] with pneumonia.  She had a parapneumonic effusion  that required thoracentesis.  During that hospitalization, she had  intermittent atrial fibrillation and was ultimately placed on Coumadin,  and her Lopressor was increased.  She also had an echocardiogram  performed on March 24, 2006.  Her LV function was normal and there was  no aortic insufficiency.  There was mild mitral regurgitation.  There  was mild tricuspid regurgitation.  The prosthetic aortic valve was not  well visualized.  Since discharge, she has done well.  She denies any  dyspnea, chest pain, palpitations or syncope.  She is continuing to try  to improve her strength.  Her medications at present include Zetia 10 mg  p.o. daily, metoprolol 50 mg p.o. b.i.d., Coumadin as directed,  omeprazole, Lasix 40 mg p.o. daily, and aspirin 81 mg p.o. daily.   Her physical exam today shows a blood pressure of 133/75, and her pulse  is 104.  Her chest is clear.  CARDIOVASCULAR EXAM:  There is a regular rate and rhythm.  There is 1/6  systolic ejection murmur at the left sternal border.  There is no  diastolic murmur.  EXTREMITIES:  Show no edema.   Her electrocardiogram shows a sinus tachycardia at a rate of 104.  There  is a right bundle branch block and left axis deviation is noted.  Her  right bundle is new since previous.   DIAGNOSES:  1. Status post aortic valve replacement with a pericardial tissue      valve.  2. Status post repair of thoracic aortic aneurysm.  3. Coronary artery disease, status post coronary artery bypass graft.  4. Hypertension.  5. Hyperlipidemia.  6. History of intolerance to  statins.  7. History of CVA.   PLAN:  Mrs. Coonan is doing well from a symptomatic standpoint.  We will  continue with her present medications.  She is continuing to gain  strength from her recent pneumonia.  I will see her back in  approximately 3 months.  She is monitoring her blood pressure at home.  She will continue with SB prophylaxis.     Madolyn Frieze Jens Som, MD, [facility]  Electronically Signed    BSC/MedQ  DD: 04/21/2006  DT: 04/21/2006  Job #: [id number]

## 2010-08-08 NOTE — Assessment & Plan Note (Signed)
[facility]                                 ON-CALL NOTE   NAME:[name], [name]                            MRN:          [id number]  DATE:07/10/2006                            DOB:          [birth year]    TIME:  2 p.m.   PROBLEM:  This patient of Dr. Kriste Basque called reporting several episodes of  numbness on the right side of the chest and face over the last 2 weeks,  on warfarin, another episode on the date of this call associated with  mild nausea.   IMPRESSION:  I was concerned about possible ischemic episodes by  description.   PLAN:  The patient was directed to the [facility] Emergency Room immediately  for evaluation, anticipating possible stroke team evaluation.     Clinton D. Maple Hudson, MD, Tonny Bollman, FACP  Electronically Signed    CDY/MedQ  DD: 07/22/2006  DT: 07/23/2006  Job #: [id number]   cc:   Lonzo Cloud. Kriste Basque, MD

## 2010-08-08 NOTE — Consult Note (Signed)
NAME:  [name], [name]                             ACCOUNT NO.:  [id number]   MEDICAL RECORD NO.:  [id number]                   PATIENT TYPE:  INP   LOCATION:  3025                                 FACILITY:  [facility]   PHYSICIAN:  Rollene Rotunda, M.D.                DATE OF BIRTH:  15-Apr-1923   DATE OF CONSULTATION:  11/29/2002  DATE OF DISCHARGE:                                   CONSULTATION   REFERRING PHYSICIAN:  Lonzo Cloud. Kriste Basque, M.D.   REASON FOR CONSULTATION:  Evaluate patient with CVA secondary to presumed  multiple emboli.   HISTORY OF PRESENT ILLNESS:  The patient is a 77 year old white female with  past history of aortic insufficiency and an ascending aortic aneurysm. She  had replacement with a bioprosthetic aortic valve and a Dacron graft  ascending aorta, along with single vessel bypass with a SVG to the PDA in  September 2003. She was admitted on September 4 with altered mental status  and confusion. She was somewhat agitated and brought to the emergency room  by her husband. She did have a MRI which showed multiple deep white matter  cortical infarcts consistent with emboli. She did have a transesophageal  echocardiogram which demonstrated well preserved left ventricular function.  It was a bioprosthetic aortic valve which seemed to be functioning normally.  There was no obvious vegetation; however, this was a technically limited  study. The patient has had blood cultures with one culture demonstrating  apparent multiple organisms. There was strep viridans identified. We were  consulted for consideration of a cardiac source for her emboli and to help  rule out endocarditis.   The patient is confused and cannot give a clear history. Her sister is in  the room and does not provide too much additional information. There has not  apparently been any obvious fevers or chills. She has had no change in her  baseline which is a relatively low functional level. There has not been  any  description of chest discomfort or shortness of breath. She does not report  any palpitations, presyncope, or syncope.   PAST MEDICAL HISTORY:  1. Coronary artery disease (LAD 40% proximal stenosis, circumflex free of     significant disease, right coronary artery disease 70%).  2. Hypertension.  3. Polymyalgia rheumatica.  4. Benign positional vertigo.  5. Postoperative atrial fibrillation.   PAST SURGICAL HISTORY:  1. AVRs/CABGs/Bentall.  2. Left total knee replacement.   ALLERGIES:  PENICILLIN, SULFA, IV CONTRAST, BIAXIN, CIPRO, CODEINE,  STEROIDS.   MEDICATIONS:  1. Lopressor 50 mg b.i.d.  2. Zetia 10 mg q.d.  3. Aspirin 325 mg q.d.  4. Cozaar 100 mg q.d.   SOCIAL HISTORY:  The patient lives in [place] with her husband. She has  two children. She does not smoke cigarettes or drink alcohol.   FAMILY HISTORY:  Contributory for later onset coronary  artery disease in  first degree relatives.   REVIEW OF SYSTEMS:  Unobtainable from the patient.   PHYSICAL EXAMINATION:  GENERAL:  The patient is in no distress. She is  obviously confused.  VITAL SIGNS:  Blood pressure 165/83, heart rate 64 and regular, atrial  fibrillation.  HEENT:  Eyes unremarkable. Pupils are equal, round, and reactive to light.  Fundi not visualized. No subconjunctival hemorrhages. Oral mucosa  unremarkable. Poor dentition.  NECK:  No jugular venous distention. Wave form within normal limits. Carotid  upstroke brisk and symmetric. NO bruits. No thyromegaly. Soft, transmitted  systolic murmur of the right carotid.  LYMPHATIC:  No cervical, axillary, or inguinal adenopathy.  LUNGS:  Clear to auscultation bilaterally.  BACK:  No costovertebral angle tenderness.  CHEST:  Well healed sternotomy scar.  HEART:  PMI not displaced or sustained. S1 within normal limits. Crisp S2,  2/6 apical systolic murmur early peaking, radiating up the aortic outflow  track, no diastolic murmurs.  ABDOMEN:  Flat,  positive bowel sounds, normal in frequency and pitch, no  bruits, no rebound, no guarding, midline pulse, no mass, no hepatomegaly, no  splenomegaly.  SKIN:  No rashes, no Osler's nodes, no Janeway lesions, no splinter  hemorrhages.  NEUROLOGICAL:  Confused, motor grossly intact.   LABORATORY DATA:  WBC 4.1, hemoglobin 11.5, platelets 223. Sodium 141,  potassium 3.3, BUN 3, creatinine 0.9. Urinalysis positive for ketones but  otherwise negative.   EKG:  Sinus rhythm, rate 66, axis within normal limits, QT prolongation,  nonspecific inferolateral T wave flattening.   Chest x-ray:  No report in the chart. Echocardiogram:  EF 55 to 60%, well  functioning bioprosthetic aortic valve.   ASSESSMENT/PLAN:  1. Cerebral vascular accident with multiple emboli. The etiology is not     clear. We are consulted for evaluation of this as well as to rule out     possible endocarditis. At this point, there is no physical evidence for     endocarditis. Her exam is as listed above. She has had one culture felt     to be possibly contamination. She has had a total of three sets ordered     which is adequate. I would suggest adding rheumatoid factor as it is a     minor criteria by the Dukes criteria. The transthoracic echocardiogram     was somewhat suboptimal. She will get a transesophageal echocardiogram.     This is predominantly to look for embolic source but will also help     evaluate for vegetations. I agree that she should currently remain off of     antibiotics.  2. Status post aortic valve replacement/Bentall. The patient will continue     on her previous cardiac medications.  3. Followup based on the results of the TEE.      JH/MEDQ  D:  11/29/2002  T:  11/30/2002  Job:  [id number]   cc:   Lonzo Cloud. Kriste Basque, M.D. [facility]

## 2010-08-08 NOTE — Consult Note (Signed)
NAMEMATISYN, CABEZA NO.:  [id number]   MEDICAL RECORD NO.:  [id number]          PATIENT TYPE:  IPS   LOCATION:  4033                         FACILITY:  [facility]   PHYSICIAN:  Antonietta Breach, M.D.  DATE OF BIRTH:  [birth year]   DATE OF CONSULTATION:  DATE OF DISCHARGE:  07/09/2008                                 CONSULTATION   REQUESTING PHYSICIAN:  Claudette Laws, M.D.   REASON FOR CONSULTATION:  Psychosis , anxiety.   HISTORY OF PRESENT ILLNESS:  [name] is an 77 year old female  admitted to the [facility] on June 29, 2008 due to a L1 compression  fracture.   She has been experiencing hallucinations starting last week.  These have  progressed and she has now been experiencing 24 hours of constant  hallucinations of dead people.  She has a catastrophic level of anxiety.   She does have some baseline difficulty with memory.  She has been  receiving Risperdal.   The psychotic symptoms have not resolved with her general medical  support and the current regimen of Risperdal 0.25 mg b.i.d.   PAST PSYCHIATRIC HISTORY:  There is report of some memory difficulty.   FAMILY PSYCHIATRIC HISTORY:  None known.   SOCIAL HISTORY:  Occupation retired.  She is married.  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   PAST MEDICAL HISTORY:  1. L1 compression fracture.  2. Delirium.   Medications, allergies and laboratory data reviewed.   REVIEW OF SYSTEMS:  [name] cannot provide this of any significant  degree.  The review of systems is gleaned from the staff and medical  record and the electronic medical record.   Constitutional, head, eyes, ears, nose, throat and mouth, neurologic,  psychiatric cardiovascular, respiratory, gastrointestinal,  genitourinary, skin, musculoskeletal, hematologic lymphatic, endocrine,  metabolic reviewed.   EXAMINATION:  Vital signs stable.  Stable passive range of motion  bilateral elbows, negative cogwheeling or rigidity.  General  Appearance:  [name] is an elderly female lying in a supine position in her  hospital bed with no abnormal involuntary movements.   MENTAL STATUS EXAM:  [name] is alert.  Her eye contact is good.  Her  attention span is mildly decreased, concentration is mildly decreased  and affect is anxious.  Mood is anxious.  She is oriented to person.  She is able to smile socially.  She also reports that the hallucinations  are decreasing in volume and bothering her less.  Her memory appears to  be intact for at least recalling the higher severity of the voices in  her past day.   Fund of knowledge and intelligence are below that of her estimated  premorbid baseline.  Her speech involves soft volume but normal rate and  prosody without dysarthria.  Thought process is coherent.  Thought  content:  Auditory hallucinations.  Insight is partial, judgment is  impaired.   Notes medical transcription please place that is.   ASSESSMENT:  AXIS I:  293.82 psychotic disorder not otherwise specified  with hallucinations.  AXIS II:  Deferred.  AXIS III:  See past medical history.  AXIS IV:  General medical primary support group.  AXIS V:  35.   RECOMMENDATIONS:  1. Concur with utilizing Risperdal 0.25 mg b.i.d. for anti-psychosis.      This dosage should be an adequate dosage given enough time.  2. The estimated time till resolution of voices would be anywhere      between 3-10 days.  3. Would continue to monitor for any stiffness or other extrapyramidal      side effects of the Risperdal.  4. Once the hallucinations have resolved, would try off of the      Risperdal.  5. If the Risperdal must be sustained for anti-psychosis, would check      periodic abnormal involuntary movement scales as well as hemoglobin      A1c checks.  6. If psychiatric follow-up is required, clinics are available at      [facility] or [facility].      Antonietta Breach, M.D.   Electronically Signed     JW/MEDQ  D:  07/22/2008  T:  07/22/2008  Job:  [id number]   cc:   Erick Colace, M.D.

## 2010-08-08 NOTE — H&P (Signed)
NAME:  [name], [name]                            ACCOUNT NO.:  [id number]   MEDICAL RECORD NO.:  [id number]                   PATIENT TYPE:  INP   LOCATION:  0480                                 FACILITY:  [facility]   PHYSICIAN:  Ollen Gross, M.D.                 DATE OF BIRTH:  December 23, 1923   DATE OF ADMISSION:  10/16/2002  DATE OF DISCHARGE:                                HISTORY & PHYSICAL   DATE OF OFFICE VISIT HISTORY AND PHYSICAL:  October 06, 2002.   CHIEF COMPLAINT:  Left hip pain.   HISTORY OF PRESENT ILLNESS:  The patient is a 77 year old female who has  been seen by Dr. Lequita Halt for ongoing left hip pain.  The pain in her left  hip has been ongoing for about two years now but has gotten worse recently.  She did have an aortic valve repair back in September 2003 and had been  undergoing cardiac rehab.  Unfortunately, she was having difficulty with her  cardiac rehab due to her hip pain.  She was initially seen by Dr. Hayden Rasmussen and later referred over to Dr. Deri Fuelling office where she was seen  and found to have severe end-stage osteoarthritis with bone-on-bone changes  with slight collapse of the femoral head.  The patient states she is pretty  much hurting all the time and it is limiting her mobility and also she is  unable to continue on with her cardiac rehab.  It is felt that she has  reached the point where she would like to have something done about it.  The  risks and benefits of total hip procedure were discussed with the patient  and she elected to proceed with surgery.   ALLERGIES:  PREDNISONE/STEROIDS CAUSE FLUID RETENTION AND SWELLING.   INTOLERANCES:  1. BIAXIN CAUSES GI UPSET.  2. CEFTIN CAUSES LOOSE STOOL.  3. PENICILLIN CAUSES GI UPSET.  4. LIPITOR AND OTHER STATIN MEDICATIONS CAUSE MUSCLE PAIN.  5. CIPROFLOXACIN, FELT DRUNK WITH BAD HEADACHE.   CURRENT MEDICATIONS:  1. Cozaar 100 mg in the a.m.  2. Aspirin 81 mg daily.  3. Zetia 10 mg daily.  4.  Metoprolol 50 mg, 1/2 tablet in the a.m., 1 tablet q.p.m.   PAST MEDICAL HISTORY:  1. Bronchial asthma.  2. History of pneumonia at age 20.  3. Hypertension.  4. Coronary arterial disease.  5. She has heart valvular disease which required surgery.  6. Aortic aneurysm.  7. Diverticulitis.  8. Hemorrhoids.  9. History of urinary tract infections.  10.      History of cystitis.  11.      Osteoporosis.  12.      Postmenopausal.   PAST SURGICAL HISTORY:  1. Hysterectomy secondary to endometriosis.  2. Aortic valvular surgery.  3. Aortic aneurysm surgery.   SOCIAL HISTORY:  Married, retired, nonsmoker, no alcohol.  She has two  children.  Her husband will be assisting with her care.  She has four steps  entering into the house.   FAMILY HISTORY:  Significant for heart disease in two sisters.  Family  history is also significant for hypertension, diabetes, stroke, and  arthritis.   REVIEW OF SYSTEMS:  GENERAL:  No fevers, chills, night sweats.  NEUROLOGIC:  She occasionally gets some dizzy spells which sounds like maybe vertigo.  No  seizures, syncope or paralysis.  RESPIRATORY:  History of bronchial asthma.  No shortness of breath at this time.  No productive cough or hemoptysis.  CARDIOVASCULAR:  Significant history of hypertension, coronary arterial  disease and valvular disease.  She denies any chest pain at this time.  She  does have some shortness of breath on exertion.  No shortness of breath at  rest.  No orthopnea.  GI:  History of diverticulitis and hemorrhoids.  She  denies nausea, vomiting, blood or mucus in the stool at this time.  She does  have some constipation.  GU:  No dysuria, hematuria or discharge.  MUSCULOSKELETAL:  Pertinent for that of the left hip as in history of  present illness.   PHYSICAL EXAMINATION:  VITAL SIGNS:  Pulse 72, respirations 16, blood  pressure 142/84.  GENERAL:  The patient is a 77 year old white female, well-nourished, well-   developed, in no acute distress.  She is alert and oriented, and cooperative  at the time of exam.  HEENT:  Normocephalic, atraumatic.  Pupils are round and reactive.  EOMs are  intact.  She does have upper partial dentures.  NECK:  Supple.  CHEST:  Clear.  HEART:  Regular rate and rhythm with a pan-systolic grade 2-3/6 best heard  at the aortic, pulmonic and Erb point.  ABDOMEN:  Soft and nontender.  Bowel sounds are present.  RECTAL/BREASTS/GENITALIA:  None done.  Not pertinent to present illness.  EXTREMITIES:  ______ to the left hip.  She has hip flexion of about 90  degrees, internal rotation of about 5 degrees with pain, has more external  rotation at 35 degrees.   IMPRESSION:  1. Osteoarthritis, left hip.  2. History of bronchial asthma.  3. Hypertension.  4. Coronary arterial disease.  5. Valvular heart disease, status post valve replacement.  6. History of aortic aneurysm, status post aortic aneurysm surgery.  7. Diverticulitis.  8. Hemorrhoids.  9. History of urinary tract infections.  10.      Postmenopausal.  11.      Osteoporosis.    PLAN:  The patient was admitted to [facility] to undergo left  total hip replacement and arthroplasty.  The surgery will be performed by  Dr. Ollen Gross.  The patient's medical doctor is  Dr. Kriste Basque.  The patient's cardiologist is Dr. Jens Som.  Both Dr. Kriste Basque and  Dr. Jens Som will be notified and consulted if needed for any medical  assistance with this patient throughout the hospital course.     [name], P.A.              Ollen Gross, M.D.    ALP/MEDQ  D:  10/18/2002  T:  10/19/2002  Job:  [id number]

## 2010-08-08 NOTE — Consult Note (Signed)
NAME:  [name], [name]                   ACCOUNT NO.:  [id number]   MEDICAL RECORD NO.:  [id number]          PATIENT TYPE:  INP   LOCATION:  3303                         FACILITY:  [facility]   PHYSICIAN:  Noralyn Pick. Eden Emms, MD, FACCDATE OF BIRTH:  1923-07-10   DATE OF CONSULTATION:  03/26/2006  DATE OF DISCHARGE:                                 CONSULTATION   The patient is an 77 year old patient of Dr. Kriste Basque and Dr. Jens Som who  was admitted to the hospital on March 22, 2006, with community-  acquired left lower lobe pneumonia.  She presented with complaints of  nausea and vomiting associated with dyspnea.   The patient has a history of paroxysmal atrial fibrillation as far as I  can tell.  On admission her EKG showed atrial fibrillation.  We are  asked to see her now for it as well.  She has a history of PAF, however,  the chronicity of her rhythm is unknown to me.  The patient was not on  any anticoagulation aside from Aggrenox for her subcortical vascular  dementia.   The patient is currently recovering from her pneumonia.  The atrial  fibrillation is relatively asymptomatic.  Her hemodynamics are stable.   The patient has a history of tissue aortic valve replacement with single-  vessel bypass in 2003, by Dr. Lowella Fairy.   There was a question of SBE at one time but this did not pan out and the  patient has had a normal functioning aortic valve prosthesis.   Currently the last time she had paroxysmal atrial fibrillation was in  the postop period from her valve.   PAST MEDICAL HISTORY:  Otherwise remarkable for hypertension, history of  hyperlipidemia, diverticulosis and hemorrhoids.  Mild anemia.  Degenerative arthritis with a total hip replacement on the left side by  Dr. __________ in 2004.  History of osteoporosis.  History of  polymyalgia rheumatica.   FAMILY HISTORY:  Noncontributory.   The patient is married.  She lives with her husband.  She is a  nonsmoker.  She does not  drink alcohol.  She is allergic to penicillin  and Biaxin and intolerant to prednisone and statins.   On admission she was on Zetia 10 a day, Cozaar 100 a day, Norvasc 5 a  day, Lopressor 50 b.i.d. and Aggrenox one b.i.d.   REVIEW OF SYSTEMS:  Remarkable for increasing shortness of breath,  nausea and vomiting on admission.  She is now recovering from left lower  lobe pneumonia.   Her current exam is remarkable for atrial fibrillation at a rate of 90.  Blood pressure is stable at 120/70.  HEENT:  Normal.  She has a PICC line in the right IJ.  Otherwise  unremarkable.  She has decreased breath sounds in both bases, left greater than right.  There is an S1, S2 with normal heart sounds.  She is status post left hip replacement.  ABDOMEN:  Benign.  Distal pulses are intact with no edema.   Her current EKG shows atrial fibrillation with nonspecific ST-T wave  changes.   LAB  WORK:  Unremarkable.  The patient has mild anemia and a normal  platelet count.   The patient is taking p.o. medications.  Her chest x-ray shows  cardiomegaly, status post sternotomy with left lower lobe pneumonia.   Review of the patient's rhythm strips show atrial fibrillation for the  last few days.  There was one episode of nonsustained VT at about 11  beats.   IMPRESSION:  The patient has PAF in the setting of left lower lobe  pneumonia in advance stage.  She is currently stable.  She was admitted  to the hospital on Lopressor but this was not continued.  When she was  admitted with her pneumonia I suspect she could tolerate going back on  her p.o. beta blockers and we can stop her IV Cardizem which was started  on March 23, 2006.   I think currently rate control is all I would commit to given her  pneumonia.  I would not necessarily recommend anticoagulation at this  time.  I think she is on DVT prophylaxis.   After she recovers from her pneumonia we can follow her rhythm as an  outpatient.  The  patient will have a followup 2-D echocardiogram to  reassess her LV function and atrial sizes.  I would not use amiodarone  for conversion since she has an acute lung problem.   Overall the patient is stable and recovering from a fairly significant  community-acquired left lower lobe pneumonia and appears to be fairly  asymptomatic in regards to her PAF.      Noralyn Pick. Eden Emms, MD, [facility]  Electronically Signed     PCN/MEDQ  D:  03/26/2006  T:  03/26/2006  Job:  [id number]

## 2010-08-08 NOTE — Discharge Summary (Signed)
NAME:  [name], [name]                             ACCOUNT NO.:  [id number]   MEDICAL RECORD NO.:  [id number]                   PATIENT TYPE:  INP   LOCATION:  3017                                 FACILITY:  [facility]   PHYSICIAN:  Pramod P. Pearlean Brownie, MD                 DATE OF BIRTH:  04-28-1923   DATE OF ADMISSION:  01/24/2003  DATE OF DISCHARGE:  01/29/2003                                 DISCHARGE SUMMARY   ADMISSION DIAGNOSES:  Behavioral agitation.   DISCHARGE DIAGNOSES:  1. Chronic delirium secondary to subcortical vascular dementia.  2. Hypertension.  3. Hyperlipidemia.   HISTORY OF PRESENT ILLNESS:  [name] is a 77 year old lady who is admitted  for evaluation and treatment of increasing behavioral agitation.  She had  initially been admitted on November 23, 2002, for sudden onset of mental  status changes and confusion.  Evaluation at that point had revealed  multiple deep white matter subcortical acute infarction.  She had undergone  appropriate stratification workup and no significant vascular stenosis was  identified.  ___________had revealed no cardiogenic source of embolism.  She  obtained some improvement in her mental status but after being discharged  she noticed gradually worsening of confusion, delirium, hallucinations,  agitation and wandering behavior.  She was initially treated as an  outpatient by me and started on Risperdal with improvement in hallucinations  and delusions, agitation and wandering behavior worsened requiring  hospitalization.  On admission this time she had mild decrease in attention  span, recall restriction, but no frank hallucination.  There were no other  focal findings on exam.  The MRI scan of the brain was done on January 26, 2003, which showed no active findings.  Bilateral departmental ________  changes were seen.  EEG was performed on January 25, 2003 which showed only  mild slowing but no epileptiform activity.  Low voltage was noted on  the  EEG.  Spinal tap was performed on January 28, 2003, which revealed no WBCs,  normal protein and glucose.  CSF was negative for cytology and acid fast  bacteria, all fungal antigens.  The ESR was 37 mm.  Vitamin B12 and thyroid  hormones were normal.  RPR was negative.  The patient's Risperdal was  increased to 0.5 in the morning, 0.25 in the afternoon, at 0.5 at night.  She was also started on Depakote 250 mg twice a day.  She tolerated the  medication increase without significant increase daytime sleepiness.  She  improved in her behavior and was found to be less agitated and wandering  over the weekend.  On the day of discharge, she was quite well-behaved and  interactive, with improved attention span, restriction and judgment  impairment.  I had a long discussion with the patient and her son with  regards to her condition and informed that I did not come up with  any  treatable causes on my evaluation.  The patient may need to go to an  assisted living facility or a nursing home soon since her husband will not  be physically able of taking care of her for long.  She requires 24 hours  supervision and cannot be left alone.  The husband understands this, and  they are willing to take this responsibility at home.  The patient was  discharged home in stable condition and advised to follow up with her family  physician, Dr. Alroy Dust, in one week.  She already had an appointment to  see me on February 08, 2003.   DISCHARGE MEDICATIONS:  1. Depakote 250 mg twice a day.  2. Risperdal 0.5 mg in the morning 0.25 mg in the afternoon and 0.5 mg at     night.  3. Norvasc 5 mg once a day.  4. Zetia once a day, 10 mg.  5. Aricept 5 mg a day.  6. Aggrenox one capsule twice a day.  7.     Cozaar 200 mg every day.  8. Lopressor 50 mg twice a day.  9. Afrin nasal spray one drop twice a day.                                                Pramod P. Pearlean Brownie, MD    PPS/MEDQ  D:  01/29/2003   T:  01/29/2003  Job:  [id number]   cc:   [name], M.D. [facility]

## 2010-08-08 NOTE — Op Note (Signed)
NAME:  [name], [name]                             ACCOUNT NO.:  [id number]   MEDICAL RECORD NO.:  [id number]                   PATIENT TYPE:  INP   LOCATION:  2301                                 FACILITY:  [facility]   PHYSICIAN:  Edwin Cap. Zoila Shutter, M.D.          DATE OF BIRTH:  1923/11/05   DATE OF PROCEDURE:  DATE OF DISCHARGE:                                 OPERATIVE REPORT   PROCEDURE PERFORMED:  Transesophageal echocardiogram.   INDICATIONS FOR PROCEDURE:  The patient is a 77 year old white female with a  diagnosis of known multivessel coronary artery disease, aortic valve disease  with a proximal aortic dilatation at least 5 to 5.5 cm in diameter.  She is  scheduled for aortic valve replacement, proximal aortic conduit replacement  and coronary artery bypass grafting and for this, intraoperative  echocardiographic follow is needed as part of medical surgical management.   DESCRIPTION OF PROCEDURE:  After the successful induction of general  anesthesia and stabilization of vital signs, the OmniPlane echocardiographic  probe was passed trans orally, threaded through the esophagus into the  patient's stomach and probe pad turned cephalad and interrogation begun.  Beginning near the apex which was not technically visualized well, the left  ventricle was viewed in the short axis, transgastric technique with multiple  cuts demonstrating globally normal appearance and contractility.  The  interventricular septum was normal in appearance and was intact.  The right  ventricle was normal in appearance and function.  The probe pad was pulled  cephalad towards the origin of the papillary muscles, the beam rotated 90  degrees and the long axis field of the left ventricular cavity was taken.  This demonstrated good anterior wall contractility and normal appearance of  the chorda tendinea and mitral valve leaflets.  The beam was then rotated  once again 90 degrees and the probe head pulled  further cephalad towards the  base of the heart.  The mitral valve was then viewed.  The leaflets appeared  normal in appearance and there was a trace to mild central regurgitant jet  of no significance.  It did not traverse the left atrium but probably 5 to 6  mm.  The aortic valve was then viewed and the aortic valve leaflets were  slightly thickened in appearance.  There was a vegetation of other  abnormality at the base of the noncoronary cusp.  There was a 3+ jet of  aortic insufficiency which increased to 4+ when the systolic aortic  pressures were moved towards preoperative levels of 160 to 170 torr.  The  origin of the aortic annulus was normal in diameter at approximately 2 to  2.5 cm depending on the angle of measurement but rapidly the aorta enlarged  in a cone like fashion to between 5 and 6 cm within 5 cm of the origin.  The  interatrial septum was intact and there was no reverse  flow in the left  upper pulmonary vein.  Initial evaluation therefore was globally normal left  and right ventricular function and appearance.  Trace of central mitral  regurgitation and 3 to 4+ aortic insufficiency with a proximal ascending  aorta aneurysmal dilatation at 5 to 6 cm in diameter.  The probe was then  placed on standby and the procedure begun.  At the terminus of the procedure  which was a replacement of the aortic valve with a tissue valve and a  proximal aortic conduit and single vessel coronary artery bypass grafting  the heart was refilled, restarted and re-evaluated.  Initially, the left  ventricular wall was normal in appearance and function but there was  paradoxus of the posterior septum with over the next one hour of closure  returned towards normal posterior septal function.  The mitral valve  function was unchanged in function and appearance.  The prosthetic aortic  valve was viewed.  The leaflets could not be identified due to shadowing  from foreign material and the valve  support ring but there was no visible  aortic insufficiency and no perivalvular jets.    FINAL DIAGNOSES:  Aortic aneurysm of the proximal aorta successfully  replaced with a proximal aortic conduit.  A severely insufficient native  aortic valve replaced with a tissue valve of normal function and normal left  ventricular function now status post coronary artery bypass grafting.                                                Edwin Cap. Zoila Shutter, M.D.    SJR/MEDQ  D:  12/05/2001  T:  12/05/2001  Job:  [id number]   cc:   Gwenith Daily. Tyrone Sage, M.D.  [address]  [place]  [place] [address]  Fax: [phone]

## 2010-08-08 NOTE — Assessment & Plan Note (Signed)
[facility]                            CARDIOLOGY OFFICE NOTE   NAME:[name], [name]                          MRN:          [id number]  DATE:07/07/2006                            DOB:          1923/06/14    Mrs.  [name] returns for followup today.  She has a history of coronary  artery disease status post coronary artery bypass graft, aortic valve  replacement with a pericardial tissue valve and thoracic aortic aneurysm  repair in 2003.  She also has paroxysmal atrial fibrillation.  Since I  last saw her, she states her dyspnea is continuing to improve since her  pneumonia back in December and January.  There is no orthopnea, PND,  pedal edema, or chest pain.  She does occasionally feel her palpitations  that are not sustained.  She has not had any syncope or falls.   PRESENT MEDICATIONS:  1. Zetia 10 mg p.o. daily.  2. Metoprolol 50 mg p.o. b.i.d.  3. Coumadin as directed.  4. Omeprazole 20 mg p.o. daily.  5. Lasix 40 mg p.o. daily.  6. Aspirin 81 mg p.o. daily.  7. Methylprednisolone 4 mg p.o. daily.   PHYSICAL EXAMINATION:  VITAL SIGNS:  Blood pressure 170/70, pulse 56.  She weighs 170 pounds.  NECK:  Supple with no bruits.  CHEST:  Clear.  CARDIOVASCULAR:  Regular rhythm.  There is a 2/6 systolic ejection  murmur at the left sternal border.  There is no diastolic murmur noted.  ABDOMEN:  Exam is benign.  EXTREMITIES: Show no edema.   Electrocardiogram shows a sinus rhythm at a rate of 56.  There are minor  nonspecific ST changes.  A septal infarct cannot be excluded.   DIAGNOSES:  1. Status post aortic valve replacement with a pericardial tissue      valve:  Continue SBE prophylaxis.  2. Status post repair of thoracic aortic aneurysm:  Being following.  3. Coronary artery disease status post coronary artery bypass graft:      The patient has had no chest pain or shortness of breath.  We will      continue with medical therapy.  Note: I am  discontinuing her      aspirin today.  She is on Coumadin, and I feel the risk of the      combination outweighs the benefit in this situation.  She will      continue with the Coumadin.  4. Hypertension:  Her blood pressure is elevated today.  We will add      Norvasc 5 mg p.o. daily.  5. Hyperlipidemia:  Will continue with the Zetia.  Note: She is      intolerant to statins.  6. History of cerebrovascular accident.   We will see her back in approximately 6 months.     Madolyn Frieze Jens Som, MD, [facility]  Electronically Signed    BSC/MedQ  DD: 07/07/2006  DT: 07/07/2006  Job #: [id number]

## 2010-08-08 NOTE — H&P (Signed)
NAME:  [name], [name]                             ACCOUNT NO.:  [id number]   MEDICAL RECORD NO.:  [id number]                   PATIENT TYPE:  INP   LOCATION:  1823                                 FACILITY:  [facility]   PHYSICIAN:  Bruce Rexene Edison. Swords, M.D. [facility]           DATE OF BIRTH:  25-Feb-1924   DATE OF ADMISSION:  11/25/2002  DATE OF DISCHARGE:                                HISTORY & PHYSICAL   CHIEF COMPLAINT:  Confusion.   HISTORY OF PRESENT ILLNESS:  [name] is a 77 year old female who has a  subacute onset of confusion.  Approximately six days ago she woke up and her  husband noted that she was not quite herself.  She had become quite  confused, was not thinking clearly.  This is a clear difference than her  usual mental state.  She apparently saw Dr. Kriste [name] earlier in the week and  was scheduled for an outpatient MRI today.  The patient's family brought her  to the emergency department rather than to outpatient MRI because of  worsening confusion over the past 24 hours.  She is to the point now where  she does not recognize family members.  She talks nonsensically.  The  patient is currently sedated for the MRI and is unable to give any history.  Her son and husband gave the history.   PAST MEDICAL HISTORY:  1. Significant for a hip replacement approximately six weeks ago.  She has     done well postoperatively.  She is currently walking on her own without a     walker.  2. She has had an aortic valve replacement.  3. Thoracic aneurysm repair.  4. Bypass surgery in September of 2003.  5. She had preoperative and postoperative atrial fibrillation as documented     in previous charts.  6. Other medical history obtained from medical chart includes hypertension.  7. Hyperlipidemia.  8. Polymyalgia rheumatica.   CURRENT MEDICATIONS:  1. Zetia.  2. Metoprolol.  3. Cozaar.  4. Aspirin.  Unknown doses.   SOCIAL HISTORY:  She lives with her husband.  She is a nonsmoker.  Does  not  drink alcohol.  She has two healthy children.   FAMILY HISTORY:  Noncontributory.   REVIEW OF SYSTEMS:  Unobtainable from the patient.   PHYSICAL EXAMINATION:  VITAL SIGNS:  Blood pressure at the time of my  examination was 153/64, pulse 70, respirations 14.  On arrival to the  emergency department, temperature 98.  GENERAL:  At the time of my examination she is sleeping.  She awakens to  tactile stimulation but falls right back asleep.  HEENT:  Her pupils are equally round.  They do react to light.  Her head and  neck are atraumatic.  NECK:  Supple without lymphadenopathy, thyromegaly, jugular venous  distention.  CHEST:  Clear to auscultation without any increased work of breathing  (patient  does not cooperate with deep breathing).  CARDIAC:  S1 and S2 are both prominent.  S2 is loud.  There is a 2/6  holosystolic murmur and a 2/6 systolic ejection murmur heard.  PMI is mildly  laterally displaced but not enlarged.  ABDOMEN:  Active bowel sounds, soft, nontender.  There is no  hepatosplenomegaly.  No masses are palpated.  EXTREMITIES:  There is trace pretibial edema.  NEUROLOGIC:  She is essentially sleeping.   LABORATORY DATA:  CMET is normal except for a total protein of 5.8, albumin  3.3.  CBC is normal except for a hemoglobin of 11.5.   Urinalysis is normal.   EKG demonstrates normal sinus rhythm.  There is poor R-wave progression in  V1 through V4.   MRI of the brain demonstrates cerebral atrophy, small vessel disease,  multiple white matter, watershed infarcts.   ASSESSMENT/PLAN:  1. Confusion likely secondary to strokes.  The multiple foci of the strokes     is concerning for embolic nature, although possibly watershed area as     well.  I think she needs aggressive treatment.  Will start with heparin     and continue her aspirin.  She will be placed on telemetry bed.  Will     obtain an echocardiogram.  MRA has already been done.  Will wait for the      reading of that.  2. Anemia, mild, that can be followed along.  3. History of coronary disease, previous valve surgery, stable.  4. Recent hip surgery, doubt related to current events.  No further     evaluation at this time.   In regards to the confusion I will obtain blood culture and urine culture  just to rule out infectious etiology, although I doubt that.                                                   Bruce Rexene Edison Swords, M.D. [facility]    BHS/MEDQ  D:  11/25/2002  T:  11/25/2002  Job:  [id number]   cc:   [name], M.D. [facility]   [place], [place].D.

## 2010-08-08 NOTE — Consult Note (Signed)
NAME:  [name], MINIER                             ACCOUNT NO.:  [id number]   MEDICAL RECORD NO.:  [id number]                   PATIENT TYPE:  INP   LOCATION:  3025                                 FACILITY:  [facility]   PHYSICIAN:  Marlan Palau, M.D.               DATE OF BIRTH:  1923-08-15   DATE OF CONSULTATION:  11/27/2002  DATE OF DISCHARGE:                                   CONSULTATION   HISTORY OF PRESENT ILLNESS:  [name] is a 77 year old right-handed  white female, born [birth year] with a history of aortic valve replacement, one-  vessel bypass procedure done, and thoracic aneurysm repair in the past.  This patient has recently had a left total hip replacement and was on  Coumadin for about a month following this procedure, but now is off this  medication.  The patient had a relatively sudden change in her mental status  with increased confusion six days prior to admission that has worsened some  on the day of admission.  The patient was brought in for further evaluation.  An MRI scan of the brain done during this admission shows evidence of  multiple deep white matter and cortical infarcts consistent with an embolic  shower.  This appeared to be positive on diffusion-weighted image.  Neurology is asked to see this patient for further evaluation.  This patient  has been placed on heparin at this point.   PAST MEDICAL HISTORY:  1. New-onset confusion.  2. Multiple small embolic strokes by MRI.  3. Status post aortic valve replacement with tissue valve.  4. Thoracic aneurysm repair.  5. Cataracts.  The patient has not yet had surgery.  6. Hypertension.  7. Hypercholesterolemia.  8. CABG procedure x1.  9. Polymyalgia rheumatica.  10.      Left total hip replacement.   MEDICATIONS AT THIS TIME:  1. Lopressor 50 mg b.i.d.  2. Zetia 10 mg daily.  3. Cozaar 50 mg daily.  4. Aspirin 325 mg a day.  5. The patient is on IV heparin.  6. Ativan if needed.   HABITS:  The  patient again does not smoke or drink.   ALLERGIES:  PENICILLIN, SULFA DRUGS, CODEINE, IVP DYE, STATIN DRUGS, BIAXIN,  CIPRO, STEROIDS.   SOCIAL HISTORY:  This patient is married, lives in the [place] area, is  retired, has two children who are alive and well.   FAMILY MEDICAL HISTORY:  Mother died from renal disease.  The cause of death  of the father is unknown.  The patient has two sisters who are still living,  two brothers that are passed away, both with stroke and heart disease.   REVIEW OF SYSTEMS:  Notable for problems with blurred vision, does note some  slight recent headache, does note some shortness of breath at times, denies  chest pains, has had some nausea, no vomiting, has some  occasional bowel  incontinence, walks with a cane currently since the hip surgery, has had  some chronic right facial numbness following dental surgery two years.  Claims the left leg is somewhat weak since the hip surgery.   PHYSICAL EXAMINATION:  VITAL SIGNS:  Blood pressure 167/77, heart rate 70,  respiratory rate 18, temperature afebrile.  GENERAL:  This patient is a fairly well-developed, elderly white female who  is alert and cooperative at the time of examination.  HEENT EXAMINATION:  Head is atraumatic.  Eyes:  Pupils equal, round, and  react to light. Disks are flat bilaterally.  NECK:  Supple, no carotid bruits noted.  RESPIRATORY EXAMINATION:  Clear.  CARDIOVASCULAR EXAMINATION:  A grade 3/6 systolic ejection murmur at the  aortic area with a valvular click noted.  The patient has no significant  edema of the lower extremities.  NEUROLOGIC EXAMINATION:  Cranial nerves as above.  Facial symmetry is  present.  The patient notes decreased pinprick sensation in the right face  compared to the left.  The patient has full visual fields.  Extraocular  movements are full.  Speech is normal and not aphasic.  The patient has good  strength of the facial muscles and the muscles to the hip  joint, 4/4  bilaterally.  Motor testing reveals 5/5 strength in all fours.  Good  symmetric motor tone is noted throughout.  The patient has fair finger-to-  finger and heal-to-shin  bilaterally.  Gait was not tested.  The patient has  some decreased pinprick sensation in the left leg as compared to the right,  symmetric in the upper extremities.  Vibration is great in the right hand  than the left, otherwise symmetric in the legs.  The patient has no evidence  of pronator drift.  The patient had good finger-to-nose, finger-to-finger,  and heal-to shin.  Deep tendon reflexes remain symmetric.  Toes are neutral  bilaterally.   LABORATORY VALUES:  Sodium 139, potassium 3.8, chloride 111, CO2 25, glucose  77, BUN 12, creatinine 1, calcium 8.4.  Total protein 5.8, albumin 3.3, AST  25, ALT 12, alkaline phosphatase 104, total bilirubin 1.  White count 4.7,  hemoglobin 11.5, hematocrit 34.5, MCV 85.1, platelets 238,000.  Blood  cultures have shown gram-positive cocci in chains.  Urinalysis reveals a  specific gravity of 1.014, pH of 5, ketones 15 mg/dL.  INR 1.2.   The patient has a heart rate of 66, normal sinus rhythm, prolonged Q-T  interval.   IMPRESSION:  1. Onset of confusion.  2. Multiple small embolic strokes by MRI.  3. Aortic valve replacement.  4. Hypertension.  5. Hypercholesterolemia.  6. Coronary artery disease.   This patient has had episodes of transient atrial fibrillation  postoperatively in the past.  The patient was treated for atrial  fibrillation on Coumadin in the past.  The patient comes to the hospital at  this point with a sudden change of mental status that may correlate with  what appears to be an embolic shower that is likely cardiogenic in nature.  The patient will require workup to rule out valvular vegetations, rule out  infectious source of emboli.   PLAN:  1. Agree with heparin therapy for now. 2. 2-D echocardiogram and a transesophageal  echocardiogram will likely be     required.  3. Carotid Doppler study which has already been ordered.  4. Agree with blood cultures, sedimentation rate.  Would add a C-reactive     protein to this.  A series of blood cultures may be required.  Will     follow the patient's clinical course while in house.  Would follow mental     status at this point.  I am not clear that I would start Aricept at this     time.  The patient currently is somewhat paranoid, believes that the SWAT     team visited her house next door and has upset her.  The patient     otherwise is oriented to person, place, date, and year.  The patient get     two of three words at five minutes.                                               Marlan Palau, M.D.    CKW/MEDQ  D:  11/27/2002  T:  11/27/2002  Job:  [id number]   cc:   Lonzo Cloud. Kriste Basque, M.D. [facility]   Olga Millers, M.D.   [facility]  [address] [place], [address]

## 2010-08-08 NOTE — Op Note (Signed)
NAME:  [name], [name]                             ACCOUNT NO.:  [id number]   MEDICAL RECORD NO.:  [id number]                   PATIENT TYPE:  INP   LOCATION:  2301                                 FACILITY:  [facility]   PHYSICIAN:  Gwenith Daily. Tyrone Sage, M.D.            DATE OF BIRTH:  [birth year]   DATE OF PROCEDURE:  12/05/2001  DATE OF DISCHARGE:                                 OPERATIVE REPORT   PREOPERATIVE DIAGNOSES:  Ascending aortic aneurysm and aortic insufficiency.   POSTOPERATIVE DIAGNOSES:  Ascending aortic aneurysm and aortic  insufficiency.   SURGICAL PROCEDURE:  Replacement of aortic valve with a #21 pericardial  tissue valve and replacement of ascending aorta with 30 mm Dacron graft and  coronary artery bypass grafting x1 with the reverse saphenous vein graft to  the posterior descending coronary artery using endoscopic vein harvesting.   SURGEON:  Gwenith Daily. Tyrone Sage, M.D.   FIRST ASSISTANT:  Salvatore Decent. Dorris Fetch, M.D.   SECOND ASSISTANT:  Lissa Merlin, P.A.   BRIEF HISTORY AND PHYSICAL:  The patient is 77 year old female who presented  approximately 8 months prior to murmur of aortic insufficiency and a chest x-  ray consistent with ascending aortic aneurysm or ascending aortic aneurysm.  Further evaluation including echocardiogram and CT scan revealed a 4.8 cm  ascending aortic aneurysm and moderate aortic insufficiency.  The followup  CT scan done six months later demonstrated an enlarging ascending aorta up  to 5.5 cm with little change in aortic insufficiency.  Because of the fairly  significant increase in size over a short period of time in spite of the  patient's age of 4 years, replacement of her ascending aorta electively and  aortic valve replacement was recommended.  Cardiac catheterization was  performed to delineate her coronary anatomy which showed significant right  coronary lesion.  The left system was free of significant disease.  The  risks and options  were discussed with the patient and her family in detail,  and the patient was willing to proceed.   DESCRIPTION OF PROCEDURE:  The patient underwent general endotracheal  anesthesia with incident.  The skin on the chest was prepped with Betadine  and draped in the usual sterile manner.  Using the Guidant Endovein  harvesting system, a segment of vein was harvested from the right thigh and  was of good quality and caliber.  A median sternotomy was performed.  The  pericardium was opened.  The proximal ascending aorta was particularly  thinned and discolored and was obviously not a normal aorta.  At it  approached the takeoff of the right innominate, it tapered to a more normal  size.  This did allow attenuation of the proximal aorta above the takeoff of  the innominate artery and placement of a cross clamp just at the origin of  the innominate artery.  The patient was systemically heparinized.  The  aortic  and right cannulation were performed as described.  The retrograde  cardioplegic catheter was placed through the right atrial wall into the  coronary sinus.  The patient was placed on cardiopulmonary bypass to 44  liters per minute per M2.  Right spare pulmonary vein vent was placed.  The  patient's body temperature was cooled to 30 degrees.  The aortic cross clamp  was applied.  Retrograde cardioplegia was administered with diastolic arrest  of heartbeat.  The aorta was opened in the midportion, and as noted, there  was no evidence of dissection, but the proximal ascending aorta was very  thinned out but tapered down to a normal-size aortic root at the dilatation  starting above the coronary ostium.  Additional cold blood cardioplegia was  administered directly into the coronary ostium. Attention was turned first  to the posterior descending coronary artery which was opened and measured  1.5 mm.  Using a running 7-0 Prolene, a distal anastomosis was performed.  Additional cold blood  cardioplegia was administered down the vein graft.  Attention was then turned to the aortic valve, which was excised.  The  annulus was incised.  The 21 pericardial tissue valve was selected and  pledgeted sutures with pledgets on the ventricular surface were placed  circumferentially around the aortic annulus.  The valve was secured and  seated in place.  The aorta had been trimmed down on the noncoronary cusp  almost to the annulus and scalloped around the right and left coronary  ostium.  A 30 mm woven graft was then tailored to fit with the aorta that  was excised using felt strips on the outside of the aorta and a running 3-0  Prolene, the proximal anastomosis was completed and the graft seated on the  proximal ascending aorta well.  Care was taken to injure the coronary  ostium.  ____________ was placed over the suture line and needle holes.  The  graft was then cut in a tapered manner and proper length to do the distal  anastomosis using a felt strip on the outside of the aorta, a running 3-0  Prolene, which was used to perform the distal aortic anastomosis.  Prior to  complete closure of this, the heart was allowed to passively fill and deair,  and aortide-airsure was completed.  The aortic cross clamp was removed with  a total cross clamp time of 106 minutes.  A 16-gauge needle was introduced  into the left ventricular apex and secured to the area of the heart.  Transiently, the patient had complete heart block but as he rewarmed  converted to a sinus rhythm.  A partial occlusion clamp was placed on the  ascending aorta.  A single opening was created in the graft, and anastomosis  of the vein graft was then carried out to the ascending aorta.  Air was  evacuated from the graft.  The partial occlusion clamp was removed.  The  sites of the anastomosis were inspected and were free of bleeding.  The  patient had the right superior pulmonary vein vent removed.  He was then ventilated  and weaned from cardiopulmonary bypass without difficulty.  A  transesophageal echo probe had placed and showed good LV function.  There  was no aortic insufficiency or mitral insufficiency.  She remained  hemodynamically stable.  She was decannulated in the usual fashion.  Protamine sulfate was administered.  Platelets and fresh frozen also were  administered because of diffuse oozing.  The patient was hemostatic.  Two  atrial and two ventricular pacing wires were applied.  The pericardium was  loosely reapproximated.  Two mediastinal tubes were left in place.  The  sternum was closed with a #16 stainless steel wire.  The fascia was closed  with interrupted 0 Vicryl and running 3-0 Vicryl in the subcutaneous tissue  and 4-0 subcuticular stitch in the skin.  Dry dressings were applied.  Sponge and needle counts were correct at the completion of the procedure.  The patient tolerated the procedure without obvious complication and was  transferred to the surgical intensive care unit for further postoperative  care.  Total pump time was 159 minutes.                                               Gwenith Daily Tyrone Sage, M.D.    EBG/MEDQ  D:  12/06/2001  T:  12/06/2001  Job:  [id number]   cc:   Rollene Rotunda, MD [facility]  [address]  [place]  [place] [address]  Fax: 1

## 2010-08-08 NOTE — Discharge Summary (Signed)
NAME:  [name], [name]                             ACCOUNT NO.:  [id number]   MEDICAL RECORD NO.:  [id number]                   PATIENT TYPE:  INP   LOCATION:  2034                                 FACILITY:  [facility]   PHYSICIAN:  Gwenith Daily. Tyrone Sage, M.D.            DATE OF BIRTH:  11/05/1923   DATE OF ADMISSION:  12/05/2001  DATE OF DISCHARGE:  12/13/2001                                 DISCHARGE SUMMARY   PRIMARY ADMITTING DIAGNOSES:  1. Aortic insufficiency.  2. Ascending aortic aneurysm.  3. Coronary artery disease.   ADDITIONAL DIAGNOSES:  1. Hypertension.  2. Polymyalgia rheumatica.  3. History of benign positional vertigo.  4. Postoperative atrial fibrillation.   PROCEDURE PERFORMED:  1. Aortic valve replacement with #21 pericardial tissue valve.  2. Replacement of ascending aorta with a #30 Hemashield graft.  3. Coronary artery bypass grafting x1 (saphenous vein graft to the posterior     descending artery).  4. Endoscopic vein harvest right thigh.   HISTORY OF PRESENT ILLNESS:  Patient is a 77 year old female who has had a  known history of aortic insufficiency and has been followed by Dr. Jens Som.  She has also been followed for an ascending aortic aneurysm.  The aneurysm  has recently increased in size from 5.0 to 5.5 cm and she has had some  associated increase in aortic insufficiency.  She recently underwent cardiac  catheterization which did show single vessel coronary artery disease.  She  has had increase in her symptoms of shortness of breath as well as some  dyspnea on exertion.  She is seen in consultation by Dr. Tyrone Sage and it is  thought that at this time she should proceed with operative intervention.   HOSPITAL COURSE:  She was admitted on September 15, and taken to the  operating room where she underwent the above noted procedures.  She  tolerated the procedures well and was transferred the [facility] in stable  condition.  She was __________ after surgery.   She was hemodynamically  stable, awake and alert, doing well on postoperative day#1.  Later in the  day, she developed rapid atrial fibrillation and was started on amiodarone  drip.  Coumadin therapy was initiated and she was also started on Lovenox  until her INR became therapeutic.  She was slowly mobilized.  By  postoperative day #3, she was stable and ready for transfer to the floor.  Her amiodarone drip had been weaned and she had been switched to p.o.  dosage.  She continued to have intermittent episodes of atrial fibrillation  and was started on Digoxin as well as anticoagulation, beta blockade, and  amiodarone.  She has remained in atrial fibrillation.  However at the  present time, her rate is better controlled with a heart rate around 60-70.  Otherwise she has done well postoperatively.  She has been ambulating the  halls for cardiac rehab phase  I.  She is tolerating a regular diet and  having normal bowel and bladder function.  She has been weaned off of  supplemental oxygen and has maintained an O2 saturation of 95% or greater on  room air.  She has been afebrile and all vital signs have been stable  throughout her admission.  Her antiarrhythmic medications have been adjusted  and she is tolerating the new dosages quite well.  Her INR is therapeutic at  2.4 with a PT of 23.  Her other labs have remained stable with her most  recent hemoglobin and hematocrit at 8.4 and 24.8 respectively.  _________ if  she continues to remain stable, she will be ready for discharge on  12/13/2001.   DISCHARGE MEDICATIONS:  1. Lopressor 25 mg b.i.d.  2. Coumadin 2.5 mg q.d.  3. Folic acid 1 mg q.d.  4. Amiodarone 400 mg b.i.d. x1 week. then 200 mg q.d. thereafter.  5. Ultram 50 mg one to two p.o. q.4h. p.r.n. pain.  6. Lasix 40 mg p.o. q.d. x1 week.  7. K-Dur 20 mEq q.d. x1 week.   DISCHARGE INSTRUCTIONS:  She is to refrain from driving, heavy lifting, or  strenuous activity.  She may  continue a low fat, low sodium diet.  She is  asked to shower daily and clean her incisions with soap and water.  She will  follow-up with Dr. Jens Som in two weeks and have a chest x-ray at his  office at that time.  She will come to see Dr. Tyrone Sage in the [facility] on  Thursday, October 16, at 10:30 a.m.  She is asked to bring her chest x-ray  to this appointment.  A PT and INR will be drawn on Wednesday and called to  Dr. Ludwig Clarks office for further Coumadin dosing.     Gina L. Thomasena Edis, P.A.                     Gwenith Daily Tyrone Sage, M.D.    GLC/MEDQ  D:  12/12/2001  T:  12/14/2001  Job:  [id number]   cc:   Lonzo Cloud. Kriste Basque, M.D. [facility]   Madolyn Frieze. Jens Som, M.D. [facility]

## 2010-08-08 NOTE — H&P (Signed)
NAME:  [name], [name] NO.:  [id number]   MEDICAL RECORD NO.:  [id number]          PATIENT TYPE:  EMS   LOCATION:  MAJO                         FACILITY:  [facility]   PHYSICIAN:  Gailen Shelter, MD  DATE OF BIRTH:  03/05/1924   DATE OF ADMISSION:  03/22/2006  DATE OF DISCHARGE:                              HISTORY & PHYSICAL   CHIEF COMPLAINT:  Nausea and vomiting associated with dyspnea, worsening  24 hours prior to admission.   This is an 77 year old patient with a history of hypertension and  hyperlipidemia.  Patient of Dr. Jairo Ben who presents with the  above chief complaint.  The patient developed prodrome approximately 4-5  days prior to admission of malaise with associated nausea and  arthralgias.  Approximately 24 hours prior to admission, the patient  developed dyspnea, nausea, fevers and chills.  She was brought to the ED  via EMS due to inability to ambulate from feeling generalized weakness.  In the emergency room, the patient was evaluated.  She was noted to have  diminished oxygen saturations to approximately 85% on room air.  The  patient was then evaluated and was noted to have a left lower lobe  infiltrate.  We are asked to evaluate the patient for admission.   The patient again does not elaborate any further on those symptoms  above.  She does, however, complain of increased thirst with associated  symptoms as above.  The patient denies any chest pain.   PAST MEDICAL HISTORY:  Remarkable for:  1. Subcortical vascular dementia.  2. Hypertension.  3. Hyperlipidemia.  4. Osteoarthritis status post left total hip replacement.  5. Polymyalgia rheumatica, currently not active.  6. Paroxysmal atrial fibrillation which is remote issue and none      recently.  7. Diverticulosis and hemorrhoids.  8. Status post thoracic aneurysm repair by Dr. Ofilia Neas with aortic      valve bioprosthesis.  9. Anemia of chronic disease.   FAMILY HISTORY:   Noncontributory.   SOCIAL HISTORY:  The patient is married, lives with her husband.  She is  a never smoker.  She does not imbibe any alcohol.   DRUG ALLERGIES:  Are noted to PENICILLIN, BIAXIN.  She is intolerant of  PREDNISONE and is also intolerant to STATINS.   CURRENT MEDICATIONS:  1. Zetia 10 mg daily.  2. Cozaar 100 mg daily.  3. Norvasc 5 mg daily.  4. Lopressor 50 mg b.i.d.  5. Aggrenox 1 capsule b.i.d.   REVIEW OF SYSTEMS:  Is as noted above, otherwise noncontributory.   PHYSICAL EXAM:  VITAL SIGNS:  Blood pressure was 93/37, respiratory rate  of 20, temperature of 98.6, pulse of 78, and oxygen saturation of 91% to  94% on 2 L nasal cannula O2.  GENERAL:  This is an obese, acutely ill-appearing female in no acute  respiratory distress.  She appears to be slightly lethargic but answers  questions appropriately.  She is oriented x4.  HEENT EXAMINATION:  Reveals the patient has parched oral mucosa.  Conjunctivae are slightly pale.  NECK:  Supple.  No adenopathy noted.  No JVD.  Trachea is midline.  CHEST:  The patient has crackles and egophony at the left base.  No  wheezes noted.  CARDIAC EXAMINATION:  Regular rate and rhythm with crisp valve sounds  noted.  No rubs, murmurs or gallops noted.  ABDOMEN:  Obese, soft, nontender.  No hepatosplenomegaly noted.  Normoactive bowel sounds.  GU:  The patient has a Foley to gravity.  EXTREMITIES:  The patient has no cyanosis, no clubbing and no edema.  She has, however, slightly sluggish capillary refill.  SKIN:  Pale with poor turgor.  NEUROLOGIC EXAMINATION:  Grossly nonfocal.   LABORATORY DATA:  Shows chest x-ray revealing a left lower lobe  pneumonia.  She has a white count of 15.6, H&H of 18.8 and 35.3.  She  has a left shift on the differential.  Sodium is 131, potassium 3.8,  chloride 102, carbon dioxide of 18, glucose of 114, BUN of 21,  creatinine of 1.8 which is over her baseline of 1.   IMPRESSION:  1. Left  lower lobe pneumonia, community acquired, which is quite      severe in nature.  2. She has hypoxic respiratory failure secondary to the above.  3. Volume depletion by clinical examination and by laboratory data.   PLAN:  Admit the patient.  Oxygen supplementation will be provided.  The  patient will receive IV fluids.  She will be placed on antibiotics  pending culture data to include Rocephin and azithromycin.  We will hold  her home medications with the exception of the Aggrenox.  She will be  placed on DVT and GI prophylaxis.  We will obtain cortisol level, lactic  acid level, and a urine for streptococcal and Legionella antigen as well  as arterial blood gas tonight.   The patient will be admitted to the step-down unit given that she is in  a somewhat tenuous state.  Further plans pending the patient's response  to the above.      Gailen Shelter, MD  Electronically Signed     CLG/MEDQ  D:  03/22/2006  T:  03/23/2006  Job:  [id number]   cc:   Lonzo Cloud. Kriste Basque, MD

## 2010-08-08 NOTE — Op Note (Signed)
NAME:  [name], [name]                             ACCOUNT NO.:  [id number]   MEDICAL RECORD NO.:  [id number]                   PATIENT TYPE:  INP   LOCATION:  E454                                 FACILITY:  [facility]   PHYSICIAN:  Ollen Gross, M.D.                 DATE OF BIRTH:  07-Feb-1924   DATE OF PROCEDURE:  10/16/2002  DATE OF DISCHARGE:                                 OPERATIVE REPORT   PREOPERATIVE DIAGNOSIS:  Osteoarthritis, left hip.   POSTOPERATIVE DIAGNOSIS:  Osteoarthritis, left hip.   PROCEDURE:  Left total hip arthroplasty.   SURGEON:  Ollen Gross, M.D.   ASSISTANT:  [name], P.A.   ANESTHESIA:  Spinal.   ESTIMATED BLOOD LOSS:  300.   DRAINS:  Hemovac x1.   COMPLICATIONS:  No complications, stable to the recovery room.   INDICATIONS FOR PROCEDURE:  Ms. Sebastiano is a 77 year old female  with severe  end-stage osteoarthritis of her left hip with pain refractory to  nonoperative management. She presents now for left  total hip arthroplasty.   DESCRIPTION OF PROCEDURE:  After successful administration of spinal  anesthetic, the patient was placed in the right lateral  decubitus position  with the left side up and held with the hip positioner. The left lower  extremity was isolated from her peritoneoplasty, and prepped and draped in  the usual sterile fashion.   A standard posterolateral incision was made with a #10 blade through the  subcutaneous tissue to the level of the fascia lata which was incised in  line the skin incision. The sciatic nerve was palpated and protected and the  external rotator was isolated off the femur. A  capsulectomy was performed.  The hip was dislocated in the center and the femoral head was marked.   A trial prosthesis was placed so that the center of the trial head  corresponds to the center of her native femoral head. An osteotomy line is  marked on the femoral neck and an osteotomy made with an oscillating saw.  The  femoral head was removed and then  the femur retracted anteriorly to  gain acetabular exposure.   The labrum and osteophytes were removed from the rim of the acetabulum.  Reaming was performed starting with a 47, coursing in increments of 2 to a  51 and a 52 mm Pinnacle acetabular shell was placed in anatomic position and  transfixed with 2 __________ screws. A 32-mm neutral trial liner is placed.   The femur is then prepared first with the canal finder and irrigation.  Broaching is performed with a size 3, and then a size 3 standard femoral  neck is placed with a 32 plus 1 head. The hip is reduced with excellent  stability, full extension, full external rotation, 70 degrees flexion, 40  degrees adduction and 90 degrees internal rotation and 90 degrees of flexion  and 70 degrees of internal rotation.   The hip is then dislocated and all trials were removed. The permanent apex  __________  eliminator is placed into the acetabular shell, and then a  permanent 32-mm neutral Marathon liner was placed into the shell. A sponge  is placed into the acetabular liner to prevent any cement from getting into  it.   We then placed the size 4 cement restrictor into the appropriate depth of  the femoral canal and prepared the canal with pulsatile lavage. The cement  is mixed and 1 __________  is injected to fit the femoral canal and  pressurized. The size 3 Endurance Luster standard offset stem is placed in  neutral anteversion. Once the cement is fully hardened, a new permanent 32  plus 1 head is placed and the hip reduced. Excellent stability is again  obtained.   The wound was copiously irrigated with antibiotic solution and the short  rotators were reattached to the femur through drill holes. The fascia lata  was closed over a Hemovac drain with interrupted #1 Vicryl. The subcutaneous  was closed with #1 and 2-0 Vicryl and the subcutaneous with running 4-0  Monocryl. The incision is clean and  dry and Steri-Strips and a bulky sterile  dressing was applied. She is then awakened and transported to the recovery  room in stable condition.                                               Ollen Gross, M.D.    FA/MEDQ  D:  10/16/2002  T:  10/16/2002  Job:  [id number]

## 2010-08-08 NOTE — Cardiovascular Report (Signed)
NAME:  [name], [name]                               ACCOUNT NO.:  [id number]   MEDICAL RECORD NO.:  [id number]                   PATIENT TYPE:   LOCATION:                                       FACILITY:   PHYSICIAN:  Bruce R. Juanda Chance, M.D. [facility]           DATE OF BIRTH:   DATE OF PROCEDURE:  11/08/2001  DATE OF DISCHARGE:                              CARDIAC CATHETERIZATION   PROCEDURE PERFORMED:  Cardiac catheterization.   CLINICAL HISTORY:  The patient is a very nice 77 year old woman who has been  followed by Dr. Jens Som and Dr. Tyrone Sage for a thoracic aneurysm. This  recently increased in size from 5.0 to 5.5 and was associated with some  increase in aortic regurgitation.  She was scheduled for evaluation with  angiography preoperatively.   DESCRIPTION OF PROCEDURE:  The procedure was performed via the right femoral  artery using an arterial sheath and 6 French preformed coronary catheters.  A front wall arterial puncture was performed and Omnipaque contrast was  used.  We had difficulty getting a stable position in the left ventricle to  do a left ventriculogram, so this was not performed. However, we were able  to visualize the left ventricle fairly well from aortic regurgitation.   RESULTS:  The left main coronary artery:  The left main coronary artery was  free of significant disease.   Left anterior descending:  The left anterior descending artery gave rise to  two large septal perforators and two small diagonal branches and then  terminated before the apex.  There was 40% narrowing proximal just after the  first septal perforator.   Circumflex artery:  The circumflex artery gave rise to a large intermediate  branch, an early marginal branch and an AV branch which terminated in a  small posterolateral branch. These vessels are free of significant disease.   Right coronary artery:  The right coronary  is a moderately large vessel  that gave rise to a conus branch, a right  ventricular branch, a posterior  descending branch, and two posterolateral branches. There were tandem 70%  stenoses in the distal right coronary artery before the posterior descending  branch.   AORTIC ROOT INJECTION:  An aortic root injection performed in the LAO  projection showed a very large ascending aortic aneurysm with moderate 2-3+  mitral regurgitation. Left ventricular function appeared good with an  ejection fraction estimated at 60%.   The aortic pressure was 174/69 with a mean of 110, left ventricular pressure  174/15.   CONCLUSION:  1. Large ascending aortic aneurysm with moderate aortic insufficiency.  2. Coronary artery disease with 40% narrowing in the proximal left anterior     descending, no major obstruction in the circumflex artery, and tandem 70%     stenoses in the distal right coronary artery.    RECOMMENDATIONS:  The patient is scheduled to see Dr. Tyrone Sage later this  week to setup surgery for her thoracic aneurysm.  I will discuss the results with him.                                                  Bruce Elvera Lennox Juanda Chance, M.D. [facility]    BRB/MEDQ  D:  11/08/2001  T:  11/10/2001  Job:  [id number]   cc:   Madolyn Frieze. Jens Som, M.D. [facility]   Gwenith Daily. Tyrone Sage, M.D.   Cardiopulmonary Laboratory

## 2010-08-08 NOTE — H&P (Signed)
NAME:  [name], [name]                             ACCOUNT NO.:  [id number]   MEDICAL RECORD NO.:  [id number]                   PATIENT TYPE:  INP   LOCATION:  NA                                   FACILITY:  [facility]   PHYSICIAN:  Edward B. Tyrone Sage, M.D.            DATE OF BIRTH:  1924-02-18   DATE OF ADMISSION:  DATE OF DISCHARGE:                                HISTORY & PHYSICAL   PRIMARY CARE PHYSICIAN:  Lonzo Cloud. Kriste Basque, M.D. [facility]   CARDIOLOGIST:  Madolyn Frieze. Jens Som, M.D. [facility].   HISTORY OF PRESENT ILLNESS:  The patient is a 77 year old female referred by  Dr. Jens Som for evaluation of aortic insufficiency and ascending aortic  aneurysm.  Her thoracic aneurysm has recently increased in size from 5.0 to  5.5 cm and this is associated with some increase in aortic  regurgitation/insufficiency.  She recently underwent left heart  catheterization.  This study showed that the left main was free of disease,  the left anterior descending was also free of disease, but had a 40%  narrowing proximally, but just after the first septal perforator.  The left  circumflex and its large intermediate branch are free of significant  disease.  The right coronary artery is moderately large.  There is a 70%  stenosis in the distal right coronary artery before the posterior descending  branch.  The aortic root injection showed a large ascending aortic aneurysm  with moderate 2 to 3+ aortic regurgitation/insufficiency. Left ventricular  function preserved at 60%.  The patient does note that she has significant  dyspnea on exertion.  She is short of breath whenever she performs any  household duties.  She is not experiencing chest pain or angina.  She has no  radiation of pain to the neck or arms.  She is not experiencing fever or  chills.  She has no symptoms of TIA, CVA, or amaurosis fugax.  She has no  recent nausea or vomiting.  She has no history of diabetes, COPD, or  congestive heart failure.  She has  no history of peripheral edema.  She  does, however, have trouble with constipation and has an anal fissure.   PAST MEDICAL HISTORY:  1. Atherosclerotic cardiovascular disease. Known aortic insufficiency.  2. Hypertension.  3. Strong family history of atherosclerotic cardiovascular disease.  4. Polymyalgia rheumatica treated at one time with prednisone.  This has now     been discontinued due to extreme swelling and weight gain.  5. Benign positional vertigo, status post neurologic consult September 28, 2001,     for this and is treated with Antevert.  Her symptoms have improved.  She     also has diverticulitis.   PAST SURGICAL HISTORY:  1. Status post left heart catheterization, November 08, 2001, Bruce R. Juanda Chance,     M.D. [facility].  2. Status post hysterectomy, September of 1965.   MEDICATIONS:  1. Norvasc 10 mg daily.  2. Atenolol 25 mg daily.  3. Enteric-coated aspirin 81 mg daily.  4. Valium 5 mg daily as needed for dizziness.   ALLERGIES:  PREDNISONE causes swelling, weight gain.  She is intolerant of  CODEINE.  PENICILLIN/BIAXIN coat the tongue and cause stomach burning.  CEFTIN causes loose stools.   FAMILY HISTORY:  Mother had kidney problems.  Father had coronary artery  disease.  Both died when the patient was young.  She does not know how old  they were.  She has one brother who died at age 12 of a CVA, although, he  had coronary artery disease and diabetes.  Another brother died at age 72 of  a stroke.  He also had coronary artery disease.  One sister is living, she  has coronary artery disease and diabetes and one sister has anginal  symptoms.   SOCIAL HISTORY:  The patient is married, she has two children in good  health.  She is retired currently. She has never smoked and does not use  alcoholic beverages.   PHYSICAL EXAMINATION:  VITAL SIGNS:  She is afebrile.  Blood pressure  160/72, pulse 56 and regular, respirations 18.  GENERAL:  She is a mildly obese female,  well-nourished, in no acute  distress.  She says that she has been losing weight recently after being  taken off prednisone.  She is alert and oriented x3.  HEENT:  Normocephalic and atraumatic.  Eyes; pupils equal, round, and  reactive to light.  Extraocular movements intact.  NECK:  Supple, no jugular venous distention, and no carotid bruits  auscultated.  LUNGS:  Clear to auscultation and percussion bilaterally.  HEART:  Regular rate and rhythm with a grade 3/6 systolic murmur.  ABDOMEN:  Soft, mildly obese, and nontender.  Bowel sounds are present.  No  masses on palpation.  EXTREMITIES:  No evidence of cyanosis, clubbing, or edema or ulcerations.  Lower extremities are warm and well perfused.  Femoral pulses 4/4  bilaterally.  The pedal pulses are easily palpable.  NEUROLOGY:  Nonfocal.  Gait is steady.  Muscle strength is 5/5 bilateral  grip.   IMPRESSION:  Right coronary artery stenosis, aortic insufficiency with  ascending aortic aneurysm.  Plan; aortic valve replacement, repair of  ascending aortic aneurysm, and coronary artery bypass to the right coronary  artery by Dr. Tyrone Sage, the patient is to present on December 05, 2001.     Maple Mirza, P.A.                    Gwenith Daily Tyrone Sage, M.D.    GM/MEDQ  D:  12/02/2001  T:  12/04/2001  Job:  [id number]

## 2010-11-26 ENCOUNTER — Ambulatory Visit (INDEPENDENT_AMBULATORY_CARE_PROVIDER_SITE_OTHER): Payer: Medicare Other | Admitting: Pulmonary Disease

## 2010-11-26 ENCOUNTER — Other Ambulatory Visit (INDEPENDENT_AMBULATORY_CARE_PROVIDER_SITE_OTHER): Payer: Medicare Other

## 2010-11-26 ENCOUNTER — Encounter: Payer: Self-pay | Admitting: Pulmonary Disease

## 2010-11-26 DIAGNOSIS — K219 Gastro-esophageal reflux disease without esophagitis: Secondary | ICD-10-CM

## 2010-11-26 DIAGNOSIS — F068 Other specified mental disorders due to known physiological condition: Secondary | ICD-10-CM

## 2010-11-26 DIAGNOSIS — I6529 Occlusion and stenosis of unspecified carotid artery: Secondary | ICD-10-CM

## 2010-11-26 DIAGNOSIS — I10 Essential (primary) hypertension: Secondary | ICD-10-CM

## 2010-11-26 DIAGNOSIS — K5730 Diverticulosis of large intestine without perforation or abscess without bleeding: Secondary | ICD-10-CM

## 2010-11-26 DIAGNOSIS — I4891 Unspecified atrial fibrillation: Secondary | ICD-10-CM

## 2010-11-26 DIAGNOSIS — E785 Hyperlipidemia, unspecified: Secondary | ICD-10-CM

## 2010-11-26 DIAGNOSIS — I38 Endocarditis, valve unspecified: Secondary | ICD-10-CM

## 2010-11-26 DIAGNOSIS — F411 Generalized anxiety disorder: Secondary | ICD-10-CM

## 2010-11-26 DIAGNOSIS — M810 Age-related osteoporosis without current pathological fracture: Secondary | ICD-10-CM

## 2010-11-26 DIAGNOSIS — N259 Disorder resulting from impaired renal tubular function, unspecified: Secondary | ICD-10-CM

## 2010-11-26 DIAGNOSIS — D649 Anemia, unspecified: Secondary | ICD-10-CM

## 2010-11-26 DIAGNOSIS — M1990 Unspecified osteoarthritis, unspecified site: Secondary | ICD-10-CM

## 2010-11-26 DIAGNOSIS — I2510 Atherosclerotic heart disease of native coronary artery without angina pectoris: Secondary | ICD-10-CM

## 2010-11-26 LAB — CBC WITH DIFFERENTIAL/PLATELET
Basophils Relative: 0.3 % (ref 0.0–3.0)
Eosinophils Absolute: 0.1 10*3/uL (ref 0.0–0.7)
Eosinophils Relative: 2.2 % (ref 0.0–5.0)
HCT: 38.9 % (ref 36.0–46.0)
Lymphs Abs: 1.4 10*3/uL (ref 0.7–4.0)
MCHC: 33.1 g/dL (ref 30.0–36.0)
MCV: 90.8 fl (ref 78.0–100.0)
Monocytes Absolute: 0.6 10*3/uL (ref 0.1–1.0)
Neutrophils Relative %: 65.4 % (ref 43.0–77.0)
Platelets: 234 10*3/uL (ref 150.0–400.0)
WBC: 6.3 10*3/uL (ref 4.5–10.5)

## 2010-11-26 LAB — BASIC METABOLIC PANEL
BUN: 35 mg/dL — ABNORMAL HIGH (ref 6–23)
CO2: 30 mEq/L (ref 19–32)
Chloride: 100 mEq/L (ref 96–112)
Creatinine, Ser: 1.4 mg/dL — ABNORMAL HIGH (ref 0.4–1.2)
Potassium: 4.5 mEq/L (ref 3.5–5.1)

## 2010-11-26 MED ORDER — BETAMETHASONE DIPROPIONATE 0.05 % EX CREA: TOPICAL_CREAM | Freq: Two times a day (BID) | CUTANEOUS | Status: DC

## 2010-11-26 NOTE — Progress Notes (Signed)
Subjective:    Patient ID: [name], female    DOB: [birth year], 77 y.o.   MRN: [id number]  HPI 77 y/o WF w/ mult med problems here for a follow up visit...   ~  June 18, 2009:  Add-on for follow up of recent AB exac> developed URI/ bronchitic exac & had ZPak called in but no better & came to see TP 3/21 w/ chest congestion, cough, thick mucus, wheezing documented... changed to Avelox, given Dosepak, Mucinex, Fluids... improved but stopped the dosepak "it swells me up" & ?rectal bleeding... now almost back to baseline, requests cough syrup...    Once again she stopped her Crestor 5mg - FLP w/ TChol 258 & we discussed this- she wants PAVASTATIN 40mg /d >OK...     She states that "my mind is back where it should be" & "I don't have dementia";  states she's driving now & I cautioned her and her husb about this.  ~  November 26, 2009:  6 mo ROV- "I feel good" no new complaints or concerns... BP controlled on meds below;  denies CP, palpit, SOB, etc;  on Prav40 for Chol & tol well- FLP looks pretty good & continue same;  DrPlovsky has her on 1.5 Celexa tabs daily & doing well she says... due for Fasting labs & Flu shot today.  ~  May 26, 2010:  9mo ROV c/o right hip pain & she has an upcoming appt w/ DrAlusio;  s/p left THR in 2004;  she doesn't want anything for pain- just a handicap application for her car- OK...    >She saw DrCrenshaw 9/11 for f/u HBP, CAD s/p CABG & AVR w/ thor aneurysm repair, & hx PAF (she follows SBE prophylaxis protocols, but not a coumadin candidate due to falls)> stable, no changes made;  2DEcho 9/11 showed mild LVH, normal wall motion w/ EF=60-65%, AoV prosthesis looks good, RV mild dil & RV function sl reduced;  CDopplers 9/11 showed norm looking carotids w/ good flow but tort distal ICAs...    >BP remains well controlled at 124/68 on her 3 meds;  she denies CP, palpit, dizziness, SOB, ch in edema, etc;  Chol looks good on the Prav40;  mild renal insuffic w/ Creat 1.5;  she  still sees DrPlovsky every 60mo...  ~  November 26, 2010:  9mo ROV & she reports doing well overall; we reviewed her meds & she requests Rx for Betamethasone cream for her scalp- ok;  Labs essentially wnl- BUN=35, Creat=1.4, Hg=12.9    >BP controlled on Metop, Amlodip, Lasix w/ BP= 122/70; she denies CP, palpit, ch in dyspnea, edema, etc; known CAD w/ CABG 2003 along w/ tissue AVR & Mabeline Caras AA repair; Hx PAF but holding NSR...    >She saw DrGioffre 4/12 for right hip pain gradually worse, hx prev leftTHR~2004, XRays showed severe degen arthritis & he rec surg but she is reluctant; alternatively he rec Tylenol & use a cane...    >Hx stroke w/ tortuous vessels on CDoppler analysis by [facility]; Hx Dementia & prev Delirium w/ psychotic features; now followed by DrPlovsky & back on Celexa (she was hearing "singing" & improved back on Rx).          Problem List:  HEARING LOSS - ENT eval 9/10 by DrTeoh...  BRONCHITIS, RECURRENT (ICD-491.9) >> see above... Hx of PNEUMONIA (ICD-486)  HYPERTENSION (ICD-401.9) - on ASA 325mg /d, LOPRESSOR 50mg Bid, NORVASC 5mg /d, & LASIX 40mg - 2tabsQam... BP=122/70 & doing well... denies HA, visual changes, CP,  palipit, dizziness, syncope, change in dyspnea, or incr edema...  CORONARY ARTERY DISEASE (ICD-414.00) - S/P CABG 2003 w/ vein graft to PDA... no CP, palpit, or change in dyspnea... she is still pretty sedentary... followed by Aletta Edouard for Cards... ~   last NuclearStressTest 6/08 showed RBBB, no ischemia, EF=76%... ~  labs 9/09 showed BNP= 353 on Lasix 80mg /d. ~  labs 1/10 showed BNP= 204 ~  saw DrCrenshaw 9/10 w/ 2DEcho- mild LVH, EF=60%, wall motion norm, tissue AoV prosthesis- OK.  Hx of VALVULAR HEART DISEASE (ICD-424.90) - S/P tissue AVR & Thor AA repair 2003. ~  she knows about the need for SBE prophylaxis. ~  2DEcho 3/09 showed a bioprosthetic valve w/ 21mm gradient, & norm LVF w/ EF= 55-60%. ~  2DEcho 9/10- mild LVH, EF=60%, wall motion norm, tissue AoV  prosthesis- working well...  Hx of ATRIAL FIBRILLATION (ICD-427.31) - PAF prev on coumadin & stopped 2/09... she is holding NSR...  CEREBROVASCULAR ACCIDENT, HX OF (ICD-V12.50) - Eval and Rx DrSethi... She remains on ASA 325mg /d. ~  CDoppler 10/08 w/ tort ICA's, no plaque, normal carotids. ~  CDoppler 4/10 at [facility] read by [facility] showed bilat 40-59% ICA stenoses believed to be from tortuosity of vessels.  HYPERLIPIDEMIA (ICD-272.4) - she stopped the Crestor 5mg  again> she will agree to try Prav40 since it is cheaper. ~  FLP 1/08 showed TChol 128, TG 94, HDL 40, LDL 69... ~  FLP 1/10 on diet showed TChol 247, TG 200, HDL 40, LDL 166... rec> start Crestor5. ~  FLP 7/10 on Cres5 showed TChol 157, TG 202, HDL 42, LDL 85... rec> continue same. ~  11/10: pt reports that she stopped the Crestor ?why- ?mild symptoms, advised to restart Rx. ~  FLP 3/11 on diet alone showed TChol 258, TG 359, HDL 49, LDL 140... start PRAV40. ~  FLP 9/11 on Prav40 showed TChol 210, TG 364, HDL 41, LDL 103... needs better low fat diet. ~  It has been hard to get her in FASTING for blood work.  GERD (ICD-530.81) - on OMEPRAZOLE 20mg /d Prn & controls her symptoms.  DIVERTICULOSIS OF COLON (ICD-562.10) & HEMORRHOIDS (ICD-455.6) - uses Miralax Prn... ~  last colon 10/00 [facility] w/ divertics, hems, cecal AVM.  RENAL INSUFFICIENCY (ICD-588.9) & Hx of UTI (ICD-599.0) ~  labs 3/09 showed BUN= 20, Creat= 1.4 ~  labs 9/09 showed BUN= 17, Creat= 1.1 ~  labs 7/10 showed BUN= 21, Creat= 1.4 ~  labs 3/11 showed BUN= 17, Creat= 1.2 ~  labs 9/11 showed BUN= 22, Creat= 1.3 ~  Labs 3/12 showed BUN= 32, Creat= 1.5 ~  Labs 9/12 showed BUN= 35, Creat= 1.4  DEGENERATIVE JOINT DISEASE (ICD-715.90) - s/p left THR 7/04 by DrAlusio. ~  labs 7/10 showed Vit D level = 30... rec to start Vit D 1000 u daily OTC. ~  labs 3/11 showed Vit D level = 26... rec incr Vit D to 2000 u daily.  BACK PAIN, LUMBAR (ICD-724.2) - Hosp 4/10 after fall  at home w/ L1 compression fx & retropulsed fragment in canal- eval by DrElsner w/ rec for conservative management w/ brace, rest, rehab...  DEMENTIA (ICD-294.8) - Prev hx DELIRIUM w/ Abn MRI showing atrophy, sm vessel dis, old infarct... then hosp 12/08 w/ aud hallucinations & psych rx from DrWilliford w/ Risperdal, Depakote, Ativan... 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 apparently no better off coumadin, but they left her off it at disch to the nursing home)... she is now stable on a complex psychotopic regimen per [facility], Psychiatry... see med list... ~  1/10:  now off Risperdal... on Depakote 250mg /d, Celexa 20mg - 1.5tabsQhs. ~  6/10:  now off Depakote, on Celexa alone & followed by DrPlovsky.  ANXIETY (ICD-300.00)  ANEMIA (ICD-285.9) ~  labs 3/09 showed Hg= 11.7 ~  labs 9/09 showed Hg 12.2 ~  labs 1/10 showed Hg= 12.8 ~  labs 7/10 showed Hg= 13.0 ~  labs 3/11 showed Hg= 12.8 ~  Labs 3/12 showed Hg= 12.8 ~  Labs 9/12 showed Hg= 12.9   Past Surgical History  Procedure Date  . Total hip arthroplasty     left  . Vesicovaginal fistula closure w/ tah   . Intraocular lens insertion     bilateral  . Coronary artery bypass graft   . Aortic valve replacement   . Cataract extraction     bilateral    Outpatient Encounter Prescriptions as of 11/26/2010  Medication Sig Dispense Refill  . amLODipine (NORVASC) 5 MG tablet Take 5 mg by mouth daily.        . calcium carbonate (TUMS - DOSED IN MG ELEMENTAL CALCIUM) 500 MG chewable tablet Chew 2 tablets by mouth daily.        . Cholecalciferol (VITAMIN D) 2000 UNITS tablet Take 2,000 Units by mouth daily.        . citalopram (CELEXA) 20 MG tablet Take 20 mg by mouth daily.        . folic acid (FOLVITE) 1 MG tablet TAKE 1 TABLET EVERY DAY  30 tablet  10   . furosemide (LASIX) 40 MG tablet TAKE 2 TABLETS EVERY MORNING  60 tablet  4  . metoprolol (LOPRESSOR) 50 MG tablet Take 50 mg by mouth 2 (two) times daily.        Marland Kitchen omeprazole (PRILOSEC) 20 MG capsule Take 20 mg by mouth as needed.        . polyethylene glycol powder (MIRALAX) powder Take 17 g by mouth as needed.        . pravastatin (PRAVACHOL) 40 MG tablet Take 40 mg by mouth at bedtime.        . senna-docusate (SENOKOT-S) 8.6-50 MG per tablet Take 2 tablets by mouth daily.          Allergies  Allergen Reactions  . Clarithromycin     REACTION: abd pain and nausea  . Iohexol      Desc: sob with ivp approx 20 yrs ago, pt was premedicated with 13hr prep with no problems kdean, Onset Date: [id number]   . Penicillins     REACTION: abd pain and nausea  . Prednisone     REACTION: swelling and nausea  . Statins     REACTION: abd pain and dark stools  . Sulfonamide Derivatives     REACTION: abd pain and nausea    Current Medications, Allergies, Past Medical History, Past Surgical History, Family History, and Social History were reviewed in [employer] record.    Review of Systems        See HPI - all other systems neg except as noted...  The patient complains of decreased hearing, dyspnea on exertion, peripheral edema, incontinence, and difficulty walking.  The patient denies anorexia, fever, weight loss, weight gain, vision loss, hoarseness, chest pain, syncope, prolonged cough, headaches, hemoptysis, abdominal pain, melena,  hematochezia, severe indigestion/heartburn, hematuria, muscle weakness, suspicious skin lesions, transient blindness, depression, unusual weight change, abnormal bleeding, enlarged lymph nodes, and angioedema.     Objective:   Physical Exam     WD, sl overweight, 77 y/o WF in NAD... flattened affect apparent, without change... GENERAL:  Alert & oriented x3... HEENT:  [place]/AT, EOM-wnl, PERRLA, EACs-clear, TMs-wnl, NOSE-clear, THROAT- sl dry,  clear & wnl. NECK:  Supple w/ fairROM; no JVD; normal carotid impulses w/o bruits; no thyromegaly or nodules palpated; no lymphadenopathy. CHEST:  Clear to P & A; without wheezes/ rales/ or rhonchi heard... mild congested cough... HEART: s/p med sternotomy, regular,  gr1/6 SEM without rubs or gallops apprec... ABDOMEN:  Soft & nontender; normal bowel sounds; no organomegaly or masses detected. EXT: without deformities, s/p left THR, mod arthritic changes; no varicose veins/ +venous insuffic/ tr edema is noted... NEURO:  CN's intact;  Hx back brace, L1 compression, no focal neuro changes... DERM:  No lesions noted; no rash etc...   Assessment & Plan:   HBP>  BP controlled on BBlocker, CCB, Diuretic; continue same + low sodium diet etc...  Cardiac> CAD, s/p CABG, s/p AVR tissue valve & ThorAA repair>  Followed by Aletta Edouard for Cards...  Hx PAF>  Holding NSR & she is supposed to be on ASA daily...  Hyperlipidemia>  On Prav40 + low chol, low fat diet & due for FLP but she can't seem to remember to come fasting...  GI> GERD, Divertics, Hems>  On Omep, Miralax, etc as needed...  Renal Insuffic>  Stable w/ Creat in the 1.4-1.5 range...  DJD/ s/p leftTHR>  Followed by DrGioffre, DrAlusio et al; she needs right THR as well but declines surg & treating w/ support, cane, Tylenol...  LBP>  Hx fall w/ L1 fx & eval by DrElsner who rec conservative management...  DEMENTIA>  She has hx delirium w/ hallucinations & psyche rx; now followed by DrPlovsky on Celexa...  Other medical problems as noted.Marland KitchenMarland Kitchen

## 2010-11-26 NOTE — Patient Instructions (Signed)
Today we updated your med list in EPIC...    We refilled the Betamethasone cream at your request...  Today we did your follow up blood work...    Please call the PHONE TREE in a few days for your results...    Dial [id number] & when prompted enter your patient number followed by the # symbol...    Your patient number is:  [id number]#  Call for any questions...  Let's plan a follow up visit recheck in 6 months.Marland KitchenMarland Kitchen

## 2010-12-01 ENCOUNTER — Encounter: Payer: Self-pay | Admitting: Pulmonary Disease

## 2010-12-08 ENCOUNTER — Other Ambulatory Visit: Payer: Self-pay | Admitting: Pulmonary Disease

## 2010-12-10 LAB — PROTIME-INR
INR: 1
Prothrombin Time: 13.2

## 2010-12-26 LAB — BASIC METABOLIC PANEL
BUN: 14
Calcium: 8.8
Creatinine, Ser: 1.47 — ABNORMAL HIGH
GFR calc non Af Amer: 34 — ABNORMAL LOW
Glucose, Bld: 95
Potassium: 3.1 — ABNORMAL LOW

## 2010-12-26 LAB — TROPONIN I: Troponin I: 0.04

## 2010-12-26 LAB — CBC
HCT: 41.3
Hemoglobin: 13.2
Hemoglobin: 14.2
MCHC: 33.7
MCHC: 34.3
Platelets: 169
Platelets: 185
RBC: 5.05
RDW: 15.9 — ABNORMAL HIGH
RDW: 16.1 — ABNORMAL HIGH
RDW: 16.2 — ABNORMAL HIGH

## 2010-12-26 LAB — COMPREHENSIVE METABOLIC PANEL
ALT: 15
Albumin: 2.9 — ABNORMAL LOW
Alkaline Phosphatase: 93
BUN: 10
BUN: 18
CO2: 22
Calcium: 8.8
Chloride: 109
Creatinine, Ser: 1.2
GFR calc non Af Amer: 27 — ABNORMAL LOW
Glucose, Bld: 96
Potassium: 2.9 — ABNORMAL LOW
Sodium: 141
Total Bilirubin: 0.7

## 2010-12-26 LAB — URINE CULTURE: Culture: NO GROWTH

## 2010-12-26 LAB — URINALYSIS, ROUTINE W REFLEX MICROSCOPIC
Hgb urine dipstick: NEGATIVE
Ketones, ur: 15 — AB
Specific Gravity, Urine: 1.022
pH: 5.5

## 2010-12-26 LAB — TSH: TSH: 0.976

## 2010-12-26 LAB — PROTIME-INR
INR: 1.2
INR: 2.2 — ABNORMAL HIGH
INR: 2.4 — ABNORMAL HIGH
Prothrombin Time: 25.3 — ABNORMAL HIGH
Prothrombin Time: 26.8 — ABNORMAL HIGH

## 2010-12-26 LAB — DIFFERENTIAL
Basophils Absolute: 0
Basophils Relative: 0
Eosinophils Absolute: 0.1
Lymphocytes Relative: 25
Neutro Abs: 2.5
Neutro Abs: 3.7
Neutrophils Relative %: 69

## 2010-12-26 LAB — CK TOTAL AND CKMB (NOT AT ARMC)
CK, MB: 1.9
Total CK: 89

## 2010-12-26 LAB — URINE MICROSCOPIC-ADD ON

## 2010-12-26 LAB — B-NATRIURETIC PEPTIDE (CONVERTED LAB): Pro B Natriuretic peptide (BNP): 77

## 2010-12-26 LAB — FOLATE: Folate: >20

## 2010-12-26 LAB — APTT: aPTT: 44 — ABNORMAL HIGH

## 2010-12-29 LAB — URINALYSIS, ROUTINE W REFLEX MICROSCOPIC
Nitrite: NEGATIVE
Specific Gravity, Urine: 1.009
pH: 5

## 2010-12-29 LAB — DIFFERENTIAL
Basophils Relative: 1
Eosinophils Absolute: 0.1 — ABNORMAL LOW
Eosinophils Relative: 1
Monocytes Relative: 8
Neutrophils Relative %: 77

## 2010-12-29 LAB — COMPREHENSIVE METABOLIC PANEL
ALT: 13
AST: 20
Alkaline Phosphatase: 112
CO2: 25
GFR calc non Af Amer: 38 — ABNORMAL LOW
Glucose, Bld: 139 — ABNORMAL HIGH
Potassium: 3.8
Sodium: 135
Total Protein: 6.3

## 2010-12-29 LAB — CBC
Hemoglobin: 13.3
RBC: 4.77

## 2010-12-29 LAB — BASIC METABOLIC PANEL
Chloride: 103
Creatinine, Ser: 1.27 — ABNORMAL HIGH
GFR calc Af Amer: 49 — ABNORMAL LOW
GFR calc non Af Amer: 40 — ABNORMAL LOW

## 2010-12-29 LAB — APTT: aPTT: 40 — ABNORMAL HIGH

## 2010-12-29 LAB — PROTIME-INR
INR: 2.1 — ABNORMAL HIGH
Prothrombin Time: 24.3 — ABNORMAL HIGH

## 2010-12-31 ENCOUNTER — Ambulatory Visit (INDEPENDENT_AMBULATORY_CARE_PROVIDER_SITE_OTHER): Payer: Medicare Other

## 2010-12-31 DIAGNOSIS — Z23 Encounter for immunization: Secondary | ICD-10-CM

## 2011-01-06 ENCOUNTER — Other Ambulatory Visit: Payer: Self-pay | Admitting: Pulmonary Disease

## 2011-01-07 LAB — DIFFERENTIAL
Basophils Absolute: 0
Basophils Absolute: 0.1
Basophils Relative: 1
Basophils Relative: 1
Basophils Relative: 2 — ABNORMAL HIGH
Eosinophils Absolute: 0.1
Eosinophils Absolute: 0.1
Eosinophils Relative: 2
Lymphs Abs: 1.1
Monocytes Relative: 10
Monocytes Relative: 9
Neutro Abs: 2.9
Neutro Abs: 4.3
Neutrophils Relative %: 62
Neutrophils Relative %: 67
Neutrophils Relative %: 71

## 2011-01-07 LAB — COMPREHENSIVE METABOLIC PANEL
ALT: 13
ALT: 16
AST: 23
Alkaline Phosphatase: 103
Alkaline Phosphatase: 98
BUN: 12
CO2: 25
CO2: 25
Calcium: 9.1
Chloride: 100
GFR calc Af Amer: 58 — ABNORMAL LOW
GFR calc non Af Amer: 50 — ABNORMAL LOW
Glucose, Bld: 85
Glucose, Bld: 88
Potassium: 3.2 — ABNORMAL LOW
Potassium: 4.2
Sodium: 134 — ABNORMAL LOW
Sodium: 136
Total Bilirubin: 0.8
Total Protein: 6.6

## 2011-01-07 LAB — I-STAT 8, (EC8 V) (CONVERTED LAB)
BUN: 13
Glucose, Bld: 73
HCT: 41
Hemoglobin: 13.9
Operator id: 277751
Potassium: 4.2
Sodium: 136

## 2011-01-07 LAB — TROPONIN I: Troponin I: 0.03

## 2011-01-07 LAB — POCT CARDIAC MARKERS
CKMB, poc: 1.1
CKMB, poc: 1.3
Myoglobin, poc: 120
Myoglobin, poc: 130
Myoglobin, poc: 135
Operator id: 146091
Operator id: 146091
Operator id: 277751
Operator id: 288331
Troponin i, poc: <0.05
Troponin i, poc: <0.05

## 2011-01-07 LAB — CBC
HCT: 39.4
Hemoglobin: 13.3
Hemoglobin: 13.4
MCHC: 33.2
Platelets: 326
RBC: 4.63
RBC: 4.69
RBC: 4.73
RDW: 16.1 — ABNORMAL HIGH
RDW: 16.4 — ABNORMAL HIGH

## 2011-01-07 LAB — CARDIAC PANEL(CRET KIN+CKTOT+MB+TROPI)
CK, MB: 1.1
Total CK: 54
Total CK: 65

## 2011-01-07 LAB — CK TOTAL AND CKMB (NOT AT ARMC): Relative Index: INVALID

## 2011-01-07 LAB — PROTIME-INR
INR: 2.8 — ABNORMAL HIGH
Prothrombin Time: 24.9 — ABNORMAL HIGH

## 2011-01-07 LAB — D-DIMER, QUANTITATIVE: D-Dimer, Quant: 2.17 — ABNORMAL HIGH

## 2011-01-07 LAB — MAGNESIUM: Magnesium: 2.4

## 2011-01-07 LAB — TSH: TSH: 1.476

## 2011-01-08 LAB — BASIC METABOLIC PANEL
BUN: 8
Creatinine, Ser: 0.98
GFR calc non Af Amer: 55 — ABNORMAL LOW
Glucose, Bld: 99
Potassium: 2.6 — CL

## 2011-01-08 LAB — POCT CARDIAC MARKERS
Myoglobin, poc: 186
Operator id: 277751
Troponin i, poc: <0.05

## 2011-01-08 LAB — COMPREHENSIVE METABOLIC PANEL WITH GFR
ALT: 14
AST: 17
Albumin: 2.9 — ABNORMAL LOW
Alkaline Phosphatase: 81
BUN: 8
CO2: 27
Calcium: 8.7
Chloride: 106
Creatinine, Ser: 1.09
GFR calc non Af Amer: 48 — ABNORMAL LOW
Glucose, Bld: 90
Potassium: 3.9
Sodium: 140
Total Bilirubin: 0.6
Total Protein: 5.7 — ABNORMAL LOW

## 2011-01-08 LAB — URINALYSIS, ROUTINE W REFLEX MICROSCOPIC
Bilirubin Urine: NEGATIVE
Glucose, UA: NEGATIVE
Hgb urine dipstick: NEGATIVE
Ketones, ur: NEGATIVE
Leukocytes, UA: NEGATIVE
Nitrite: NEGATIVE
Protein, ur: NEGATIVE
Specific Gravity, Urine: 1.006 (ref 1.005–1.035)
Urobilinogen, UA: 0.2
pH: 6

## 2011-01-08 LAB — CBC
HCT: 37.6
MCV: 84.4
Platelets: 290
RDW: 15.6 — ABNORMAL HIGH

## 2011-01-08 LAB — MAGNESIUM: Magnesium: 2.3

## 2011-01-08 LAB — CARDIAC PANEL(CRET KIN+CKTOT+MB+TROPI)
CK, MB: 1.5
CK, MB: 1.8
Relative Index: INVALID
Total CK: 69
Troponin I: 0.03

## 2011-01-08 LAB — LIPID PANEL
HDL: 38 — ABNORMAL LOW
Total CHOL/HDL Ratio: 5.5
Triglycerides: 215 — ABNORMAL HIGH
VLDL: 43 — ABNORMAL HIGH

## 2011-01-08 LAB — DIFFERENTIAL
Basophils Absolute: 0
Eosinophils Absolute: 0.1
Eosinophils Relative: 2
Lymphocytes Relative: 23
Neutrophils Relative %: 64

## 2011-01-08 LAB — PROTIME-INR
INR: 1.9 — ABNORMAL HIGH
INR: 2.2 — ABNORMAL HIGH
Prothrombin Time: 22.2 — ABNORMAL HIGH
Prothrombin Time: 25.5 — ABNORMAL HIGH

## 2011-01-08 LAB — URINE CULTURE
Colony Count: 50000
Special Requests: NEGATIVE

## 2011-01-08 LAB — CK TOTAL AND CKMB (NOT AT ARMC): Total CK: 118

## 2011-01-08 LAB — TSH: TSH: 1.461

## 2011-01-08 LAB — TROPONIN I

## 2011-01-08 LAB — B-NATRIURETIC PEPTIDE (CONVERTED LAB): Pro B Natriuretic peptide (BNP): 310 — ABNORMAL HIGH

## 2011-01-20 ENCOUNTER — Telehealth: Payer: Self-pay | Admitting: Pulmonary Disease

## 2011-01-20 MED ORDER — BETAMETHASONE DIPROPIONATE 0.05 % EX LOTN: TOPICAL_LOTION | Freq: Two times a day (BID) | CUTANEOUS | Status: DC

## 2011-01-20 NOTE — Telephone Encounter (Signed)
Called and spoke with pt and she stated that we sent in betamethasone cream and she is needing the lotion.  She spoke with the pharmacy and they told her that if she would  Call and speak with our office and have [place] send in the new rx that they would take the cream back.  This rx for the betamethasone lotion has been sent to the pts pharmacy and the pt is aware.  Med list has been updated

## 2011-03-25 DIAGNOSIS — F0390 Unspecified dementia without behavioral disturbance: Secondary | ICD-10-CM | POA: Diagnosis not present

## 2011-03-25 DIAGNOSIS — F05 Delirium due to known physiological condition: Secondary | ICD-10-CM | POA: Diagnosis not present

## 2011-03-25 DIAGNOSIS — F329 Major depressive disorder, single episode, unspecified: Secondary | ICD-10-CM | POA: Diagnosis not present

## 2011-05-27 ENCOUNTER — Encounter: Payer: Self-pay | Admitting: Pulmonary Disease

## 2011-05-27 ENCOUNTER — Ambulatory Visit (INDEPENDENT_AMBULATORY_CARE_PROVIDER_SITE_OTHER): Payer: Medicare Other | Admitting: Pulmonary Disease

## 2011-05-27 VITALS — BP 120/66 | HR 61 | Temp 97.7°F | Ht 63.0 in | Wt 192.2 lb

## 2011-05-27 DIAGNOSIS — I10 Essential (primary) hypertension: Secondary | ICD-10-CM | POA: Diagnosis not present

## 2011-05-27 DIAGNOSIS — I38 Endocarditis, valve unspecified: Secondary | ICD-10-CM

## 2011-05-27 DIAGNOSIS — I2510 Atherosclerotic heart disease of native coronary artery without angina pectoris: Secondary | ICD-10-CM | POA: Diagnosis not present

## 2011-05-27 DIAGNOSIS — K219 Gastro-esophageal reflux disease without esophagitis: Secondary | ICD-10-CM

## 2011-05-27 DIAGNOSIS — M810 Age-related osteoporosis without current pathological fracture: Secondary | ICD-10-CM

## 2011-05-27 DIAGNOSIS — I6529 Occlusion and stenosis of unspecified carotid artery: Secondary | ICD-10-CM

## 2011-05-27 DIAGNOSIS — K5730 Diverticulosis of large intestine without perforation or abscess without bleeding: Secondary | ICD-10-CM

## 2011-05-27 DIAGNOSIS — M545 Low back pain, unspecified: Secondary | ICD-10-CM

## 2011-05-27 DIAGNOSIS — F411 Generalized anxiety disorder: Secondary | ICD-10-CM

## 2011-05-27 DIAGNOSIS — F068 Other specified mental disorders due to known physiological condition: Secondary | ICD-10-CM

## 2011-05-27 DIAGNOSIS — R0600 Dyspnea, unspecified: Secondary | ICD-10-CM

## 2011-05-27 DIAGNOSIS — M1990 Unspecified osteoarthritis, unspecified site: Secondary | ICD-10-CM

## 2011-05-27 DIAGNOSIS — E785 Hyperlipidemia, unspecified: Secondary | ICD-10-CM

## 2011-05-27 DIAGNOSIS — D649 Anemia, unspecified: Secondary | ICD-10-CM

## 2011-05-27 DIAGNOSIS — N259 Disorder resulting from impaired renal tubular function, unspecified: Secondary | ICD-10-CM

## 2011-05-27 NOTE — Patient Instructions (Signed)
Today we updated your med list in our EPIC system...    Continue your current medications the same...  Please return to our lab in the AM for your follow up fasting blood work...    Then call the PHONE TREE in a few days for your results...    Dial [id number] & when prompted enter your patient number followed by the # symbol...    Your patient number is:  [id number]#  We will arrange for a follow up appt w/ DrCrenshaw to be scheduled at your convenience on a M W F per your request...  Call for any questions...  Let's plan a follow up appt next Oct2013.Marland KitchenMarland Kitchen

## 2011-05-27 NOTE — Progress Notes (Signed)
Subjective:    Patient ID: [name], female    DOB: 04-05-1923, 78 y.o.   MRN: [id number]  HPI 78 y/o WF w/ mult med problems here for a follow up visit...   ~  May 26, 2010:  19mo ROV c/o right hip pain & she has an upcoming appt w/ DrAlusio;  s/p left THR in 2004;  she doesn't want anything for pain- just a handicap application for her car- OK...    >She saw DrCrenshaw 9/11 for f/u HBP, CAD s/p CABG & AVR w/ thor aneurysm repair, & hx PAF (she follows SBE prophylaxis protocols, but not a coumadin candidate due to falls)> stable, no changes made;  2DEcho 9/11 showed mild LVH, normal wall motion w/ EF=60-65%, AoV prosthesis looks good, RV mild dil & RV function sl reduced;  CDopplers 9/11 showed norm looking carotids w/ good flow but tort distal ICAs...    >BP remains well controlled at 124/68 on her 3 meds;  she denies CP, palpit, dizziness, SOB, ch in edema, etc;  Chol looks good on the Prav40;  mild renal insuffic w/ Creat 1.5;  she still sees DrPlovsky every 60mo...  ~  November 26, 2010:  19mo ROV & she reports doing well overall; we reviewed her meds & she requests Rx for Betamethasone cream for her scalp- ok;  Labs essentially wnl- BUN=35, Creat=1.4, Hg=12.9    >BP controlled on Metop, Amlodip, Lasix w/ BP= 122/70; she denies CP, palpit, ch in dyspnea, edema, etc; known CAD w/ CABG 2003 along w/ tissue AVR & Mabeline Caras AA repair; Hx PAF but holding NSR...    >She saw DrGioffre 4/12 for right hip pain gradually worse, hx prev leftTHR~2004, XRays showed severe degen arthritis & he rec surg but she is reluctant; alternatively he rec Tylenol & use a cane...    >Hx stroke w/ tortuous vessels on CDoppler analysis by [facility]; Hx Dementia & prev Delirium w/ psychotic features; now followed by DrPlovsky & back on Celexa (she was hearing "singing" & improved back on Rx).  ~  May 27, 2011:  19mo ROV & she states she is doing well & has no new complaints or concerns> but weight is up 10# to 192# today and she  notes some right hip pain for which she takes Tylenol; she ambulates w/ walker but does ok w/ her ADLs she says...     HBP> on Metop50Bid, Amlodip5, Lasix40- 2tabs daily; BP= 120/66 & she denies CP, palpit, syncope, SOB, edema...    CAD> s/p CABG 2003, followed by DrCrenshaw & last seen 9/11 (see below); we will refer for her overdue yearly f/u visit...    Valv heart dis> s/p tissue AVR & ThorAA repair in 2003; she remains too sedentary & rec to incr her exercise as able; Hx PAF in past but holding NSR x years now.    Carotid art dis & Hx TIA> on ASA 325mg /d; prev eval & rx by DrSethi; she was on Coumadin in past but this was stopped 2009 in [place]...    CHOL> on Prav40; FLP shows TChol 188, TG 269, HDL 47, LDL 96; needs better low fat diet...    Ortho> c/o right hip pain w/ known severe degen arthritis- she has declined surg (s/p prev left THR- DrAlusio 2004); uses Tylenol as needed; ambulates w/ cane/ walker...    Dementia/ Psyche> on Risperdone0.25Qhs and Celexa20mg /d; followed by [facility] for psychiatry... LABS 3/13:  FLP- at goals x TG=269;  Chems- ok w/ Creat=1.4;  CBC- wnl;  TSH=2.97;  BNP=109          Problem List:  HEARING LOSS - ENT eval 9/10 by DrTeoh...  BRONCHITIS, RECURRENT (ICD-491.9) >> see above... Hx of PNEUMONIA (ICD-486)  HYPERTENSION (ICD-401.9) - on ASA 325mg /d, LOPRESSOR 50mg Bid, NORVASC 5mg /d, & LASIX 40mg - 2tabsQam... BP=122/70 & doing well... denies HA, visual changes, CP, palipit, dizziness, syncope, change in dyspnea, or incr edema...  CORONARY ARTERY DISEASE (ICD-414.00) - S/P CABG 2003 w/ vein graft to PDA... no CP, palpit, or change in dyspnea... she is still pretty sedentary... followed by Aletta Edouard for Cards... ~   last NuclearStressTest 6/08 showed RBBB, no ischemia, EF=76%... ~  labs 9/09 showed BNP= 353 on Lasix 80mg /d. ~  labs 1/10 showed BNP= 204 ~  saw DrCrenshaw 9/10 w/ 2DEcho- mild LVH, EF=60%, wall motion norm, tissue AoV prosthesis- OK. ~   f/u DrCrenshaw 9/11 & he felt she was stable & ordered repeat CDoppler & 2DEcho...  Hx of VALVULAR HEART DISEASE (ICD-424.90) - S/P tissue AVR & Thor AA repair 2003. ~  she knows about the need for SBE prophylaxis. ~  2DEcho 3/09 showed a bioprosthetic valve w/ 21mm gradient, & norm LVF w/ EF= 55-60%. ~  2DEcho 9/10- mild LVH, EF=60%, wall motion norm, tissue AoV prosthesis- working well... ~  2DEcho 9/11 showed mild LVH, normal wall motion w/ EF=60-65%, AoV prosthesis looks good, RV mild dil & RV function sl reduced.  Hx of ATRIAL FIBRILLATION (ICD-427.31) - PAF prev on coumadin & stopped 2/09... she is holding NSR...  CEREBROVASCULAR ACCIDENT, HX OF (ICD-V12.50) - Eval and Rx DrSethi... She remains on ASA 325mg /d. ~  CDopplers 10/08 w/ tort ICA's, no plaque, normal carotids. ~  [facility]lers 4/10 at [facility] read by [facility] showed bilat 40-59% ICA stenoses believed to be from tortuosity of vessels. ~  CDopplers 9/11 showed norm looking carotids w/ good flow but tort distal ICAs...  HYPERLIPIDEMIA (ICD-272.4) - she stopped the Crestor 5mg  again> she will agree to try PRAV40 since it is cheaper. ~  FLP 1/08 showed TChol 128, TG 94, HDL 40, LDL 69... ~  FLP 1/10 on diet showed TChol 247, TG 200, HDL 40, LDL 166... rec> start Crestor5. ~  FLP 7/10 on Cres5 showed TChol 157, TG 202, HDL 42, LDL 85... rec> continue same. ~  11/10: pt reports that she stopped the Crestor ?why- ?mild symptoms, advised to restart Rx. ~  FLP 3/11 on diet alone showed TChol 258, TG 359, HDL 49, LDL 140... start PRAV40. ~  FLP 9/11 on Prav40 showed TChol 210, TG 364, HDL 41, LDL 103... needs better low fat diet. ~  It has been hard to get her in FASTING for blood work. ~  FLP 3/13 on Prav40 showed TChol 188, TG 269, HDL 47, LDL 96... Needs better low fat diet & wt reduction.  GERD (ICD-530.81) - on OMEPRAZOLE 20mg /d Prn & controls her symptoms.  DIVERTICULOSIS OF COLON (ICD-562.10) & HEMORRHOIDS (ICD-455.6) - uses Miralax  Prn... ~  last colon 10/00 [facility] w/ divertics, hems, cecal AVM.  RENAL INSUFFICIENCY (ICD-588.9) & Hx of UTI (ICD-599.0) ~  labs 3/09 showed BUN= 20, Creat= 1.4 ~  labs 9/09 showed BUN= 17, Creat= 1.1 ~  labs 7/10 showed BUN= 21, Creat= 1.4 ~  labs 3/11 showed BUN= 17, Creat= 1.2 ~  labs 9/11 showed BUN= 22, Creat= 1.3 ~  Labs 3/12 showed BUN= 32, Creat= 1.5 ~  Labs 9/12 showed BUN= 35, Creat= 1.4 ~  Labs 3/13  showed BUN= 26, Creat= 1.4  DEGENERATIVE JOINT DISEASE (ICD-715.90) - s/p left THR 7/04 by DrAlusio. ~  labs 7/10 showed Vit D level = 30... rec to start Vit D 1000 u daily OTC. ~  labs 3/11 showed Vit D level = 26... rec incr Vit D to 2000 u daily.  BACK PAIN, LUMBAR (ICD-724.2) - Hosp 4/10 after fall at home w/ L1 compression fx & retropulsed fragment in canal- eval by DrElsner w/ rec for conservative management w/ brace, rest, rehab...  DEMENTIA (ICD-294.8) - Prev hx DELIRIUM w/ Abn MRI showing atrophy, sm vessel dis, old infarct... then hosp 12/08 w/ aud hallucinations & psych rx from DrWilliford w/ Risperdal, Depakote, Ativan... 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 she is now stable on a complex psychotopic regimen per [facility], Psychiatry... see med list... ~  1/10:  now off Risperdal... on Depakote 250mg /d, Celexa 20mg - 1.5tabsQhs. ~  6/10:  now off Depakote, on Celexa alone & followed by DrPlovsky. ~  3/11:  She states that "my mind is back where it should be" & "I don't have dementia";  states she's driving now & I cautioned her and her husb about this. ~  9/12:  Followed by DrPlovsky & back on Celexa (she was hearing "singing" & improved back on Rx). ~  3/13:  Stable on Risperdal & Celexa from  DrPlovsky...  ANXIETY (ICD-300.00)  ANEMIA (ICD-285.9) ~  labs 3/09 showed Hg= 11.7 ~  labs 9/09 showed Hg 12.2 ~  labs 1/10 showed Hg= 12.8 ~  labs 7/10 showed Hg= 13.0 ~  labs 3/11 showed Hg= 12.8 ~  Labs 3/12 showed Hg= 12.8 ~  Labs 9/12 showed Hg= 12.9 ~  Labs 3/13 showed Hg= 13.1   Past Surgical History  Procedure Date  . Total hip arthroplasty     left  . Vesicovaginal fistula closure w/ tah   . Intraocular lens insertion     bilateral  . Coronary artery bypass graft   . Aortic valve replacement   . Cataract extraction     bilateral    Outpatient Encounter Prescriptions as of 05/27/2011  Medication Sig Dispense Refill  . amLODipine (NORVASC) 5 MG tablet TAKE 1 TABLET EVERY DAY  30 tablet  5  . betamethasone dipropionate 0.05 % lotion Apply topically 2 (two) times daily.  60 mL  5  . calcium carbonate (TUMS - DOSED IN MG ELEMENTAL CALCIUM) 500 MG chewable tablet Chew 2 tablets by mouth daily as needed.       . Cholecalciferol (VITAMIN D) 2000 UNITS tablet Take 2,000 Units by mouth daily.        . citalopram (CELEXA) 20 MG tablet Take 20 mg by mouth daily.        . folic acid (FOLVITE) 1 MG tablet TAKE 1 TABLET EVERY DAY  30 tablet  10  . furosemide (LASIX) 40 MG tablet TAKE 2 TABLETS EVERY MORNING  60 tablet  4  . metoprolol (LOPRESSOR) 50 MG tablet TAKE 1 TABLET TWICE DAILY  60 tablet  5  . omeprazole (PRILOSEC) 20 MG capsule Take 20 mg by mouth as needed.        . polyethylene glycol powder (MIRALAX) powder Take 17 g  by mouth as needed.        . pravastatin (PRAVACHOL) 40 MG tablet Take 40 mg by mouth at bedtime.        . risperiDONE (RISPERDAL) 0.25 MG tablet Take 1 tablet by mouth at bedtime.      . senna-docusate (SENOKOT-S) 8.6-50 MG per tablet Take 2 tablets by mouth daily as needed.         Allergies  Allergen Reactions  . Clarithromycin     REACTION: abd pain and nausea  . Iohexol      Desc: sob with ivp approx 20 yrs ago, pt was premedicated with 13hr  prep with no problems kdean, Onset Date: [id number]   . Penicillins     REACTION: abd pain and nausea  . Prednisone     REACTION: swelling and nausea  . Statins     REACTION: abd pain and dark stools  . Sulfonamide Derivatives     REACTION: abd pain and nausea    Current Medications, Allergies, Past Medical History, Past Surgical History, Family History, and Social History were reviewed in [employer] record.    Review of Systems        See HPI - all other systems neg except as noted...  The patient complains of decreased hearing, dyspnea on exertion, peripheral edema, incontinence, and difficulty walking.  The patient denies anorexia, fever, weight loss, weight gain, vision loss, hoarseness, chest pain, syncope, prolonged cough, headaches, hemoptysis, abdominal pain, melena, hematochezia, severe indigestion/heartburn, hematuria, muscle weakness, suspicious skin lesions, transient blindness, depression, unusual weight change, abnormal bleeding, enlarged lymph nodes, and angioedema.     Objective:   Physical Exam     WD, sl overweight, 78 y/o WF in NAD... flattened affect apparent, without change... GENERAL:  Alert & oriented x3... HEENT:  [place]/AT, EOM-wnl, PERRLA, EACs-clear, TMs-wnl, NOSE-clear, THROAT- sl dry, clear & wnl. NECK:  Supple w/ fairROM; no JVD; normal carotid impulses w/o bruits; no thyromegaly or nodules palpated; no lymphadenopathy. CHEST:  Clear to P & A; without wheezes/ rales/ or rhonchi heard... mild congested cough... HEART: s/p med sternotomy, regular,  gr1/6 SEM without rubs or gallops apprec... ABDOMEN:  Soft & nontender; normal bowel sounds; no organomegaly or masses detected. EXT: without deformities, s/p left THR, mod arthritic changes; no varicose veins/ +venous insuffic/ tr edema is noted... NEURO:  CN's intact;  Hx back brace, L1 compression, no focal neuro changes... DERM:  No lesions noted; no rash etc...  RADIOLOGY DATA:  Reviewed  in the EPIC EMR & discussed w/ the patient...    >>CXR 4/10 showed prev surg w/ CABG/ AVR; no acute changes...  LABORATORY DATA:  Reviewed in the EPIC EMR & discussed w/ the patient...    >>LABS 3/13:  FLP- at goals x TG=269;  Chems- ok w/ Creat=1.4;  CBC- wnl;  TSH=2.97;  BNP=109   Assessment & Plan:   HBP>  BP controlled on BBlocker, CCB, Diuretic; continue same + low sodium diet etc...  Cardiac> CAD, s/p CABG, s/p AVR tissue valve & ThorAA repair>  Followed by Aletta Edouard for Cards...  Hx PAF>  Holding NSR & she is supposed to be on ASA daily...  Hyperlipidemia>  On Prav40 + low chol, low fat diet & FLP shows good chol parameters but elev TG- needs better low fat diet.  GI> GERD, Divertics, Hems>  On Omep, Miralax, etc as needed...  Renal Insuffic>  Stable w/ Creat in the 1.4-1.5 range...  DJD/ s/p leftTHR>  Followed by  DrGioffre, DrAlusio et al; she needs right THR as well but declines surg & treating w/ support, cane, Tylenol...  LBP>  Hx fall w/ L1 fx & eval by DrElsner who rec conservative management...  DEMENTIA>  She has hx delirium w/ hallucinations & psyche rx; now followed by DrPlovsky on Celexa...  Other medical problems as noted...   Patient's Medications  New Prescriptions   No medications on file  Previous Medications   ASPIRIN 325 MG TABLET    Take 325 mg by mouth daily.   BETAMETHASONE DIPROPIONATE 0.05 % LOTION    Apply topically 2 (two) times daily.   CALCIUM CARBONATE (TUMS - DOSED IN MG ELEMENTAL CALCIUM) 500 MG CHEWABLE TABLET    Chew 2 tablets by mouth daily as needed.    CHOLECALCIFEROL (VITAMIN D) 2000 UNITS TABLET    Take 2,000 Units by mouth daily.     CITALOPRAM (CELEXA) 20 MG TABLET    Take 20 mg by mouth daily.     FOLIC ACID (FOLVITE) 1 MG TABLET    TAKE 1 TABLET EVERY DAY   FUROSEMIDE (LASIX) 40 MG TABLET    TAKE 2 TABLETS EVERY MORNING   OMEPRAZOLE (PRILOSEC) 20 MG CAPSULE    Take 20 mg by mouth as needed.     POLYETHYLENE GLYCOL POWDER  (MIRALAX) POWDER    Take 17 g by mouth as needed.     RISPERIDONE (RISPERDAL) 0.25 MG TABLET    Take 1 tablet by mouth at bedtime.   SENNA-DOCUSATE (SENOKOT-S) 8.6-50 MG PER TABLET    Take 2 tablets by mouth daily as needed.   Modified Medications   Modified Medication Previous Medication   AMLODIPINE (NORVASC) 5 MG TABLET amLODipine (NORVASC) 5 MG tablet      TAKE 1 TABLET EVERY DAY    TAKE 1 TABLET EVERY DAY   METOPROLOL (LOPRESSOR) 50 MG TABLET metoprolol (LOPRESSOR) 50 MG tablet      TAKE 1 TABLET TWICE DAILY    TAKE 1 TABLET TWICE DAILY   PRAVASTATIN (PRAVACHOL) 40 MG TABLET pravastatin (PRAVACHOL) 40 MG tablet      TAKE ONE TABLET BY MOUTH AT BEDTIME.    Take 40 mg by mouth at bedtime.    Discontinued Medications   No medications on file

## 2011-05-28 ENCOUNTER — Other Ambulatory Visit (INDEPENDENT_AMBULATORY_CARE_PROVIDER_SITE_OTHER): Payer: Medicare Other

## 2011-05-28 DIAGNOSIS — R0609 Other forms of dyspnea: Secondary | ICD-10-CM | POA: Diagnosis not present

## 2011-05-28 DIAGNOSIS — I10 Essential (primary) hypertension: Secondary | ICD-10-CM

## 2011-05-28 DIAGNOSIS — R0600 Dyspnea, unspecified: Secondary | ICD-10-CM

## 2011-05-28 DIAGNOSIS — E785 Hyperlipidemia, unspecified: Secondary | ICD-10-CM

## 2011-05-28 DIAGNOSIS — N259 Disorder resulting from impaired renal tubular function, unspecified: Secondary | ICD-10-CM | POA: Diagnosis not present

## 2011-05-28 DIAGNOSIS — F411 Generalized anxiety disorder: Secondary | ICD-10-CM | POA: Diagnosis not present

## 2011-05-28 DIAGNOSIS — R0989 Other specified symptoms and signs involving the circulatory and respiratory systems: Secondary | ICD-10-CM | POA: Diagnosis not present

## 2011-05-28 LAB — HEPATIC FUNCTION PANEL
ALT: 14 U/L (ref 0–35)
AST: 18 U/L (ref 0–37)
Alkaline Phosphatase: 81 U/L (ref 39–117)
Bilirubin, Direct: 0.1 mg/dL (ref 0.0–0.3)
Total Protein: 7 g/dL (ref 6.0–8.3)

## 2011-05-28 LAB — CBC WITH DIFFERENTIAL/PLATELET
Basophils Absolute: 0 10*3/uL (ref 0.0–0.1)
Eosinophils Absolute: 0.1 10*3/uL (ref 0.0–0.7)
Hemoglobin: 13.1 g/dL (ref 12.0–15.0)
Lymphocytes Relative: 22 % (ref 12.0–46.0)
MCHC: 33.2 g/dL (ref 30.0–36.0)
Neutro Abs: 3.1 10*3/uL (ref 1.4–7.7)
Neutrophils Relative %: 64.6 % (ref 43.0–77.0)
RDW: 15.9 % — ABNORMAL HIGH (ref 11.5–14.6)

## 2011-05-28 LAB — TSH: TSH: 2.97 u[IU]/mL (ref 0.35–5.50)

## 2011-05-28 LAB — LIPID PANEL
Cholesterol: 188 mg/dL (ref 0–200)
HDL: 46.8 mg/dL (ref >39.00)
Total CHOL/HDL Ratio: 4
Triglycerides: 269 mg/dL — ABNORMAL HIGH (ref 0.0–149.0)
VLDL: 53.8 mg/dL — ABNORMAL HIGH (ref 0.0–40.0)

## 2011-05-28 LAB — BASIC METABOLIC PANEL
CO2: 28 mEq/L (ref 19–32)
Chloride: 102 mEq/L (ref 96–112)
Potassium: 4 mEq/L (ref 3.5–5.1)
Sodium: 140 mEq/L (ref 135–145)

## 2011-05-28 LAB — BRAIN NATRIURETIC PEPTIDE: Pro B Natriuretic peptide (BNP): 109 pg/mL — ABNORMAL HIGH (ref 0.0–100.0)

## 2011-06-03 ENCOUNTER — Other Ambulatory Visit: Payer: Self-pay | Admitting: Pulmonary Disease

## 2011-06-08 ENCOUNTER — Telehealth: Payer: Self-pay | Admitting: Pulmonary Disease

## 2011-06-08 NOTE — Telephone Encounter (Signed)
Called and spoke with pt about her lab results.  Pt stated that she has been calling the phone tree to get her results but has not been able to get through.  i explained to the pt that the phone tree has been turned off and we will no longer be using this.  i have reviewed her lab results with the pt and she is aware of SN recs.   Pt voiced her understanding of the lab results.

## 2011-06-29 ENCOUNTER — Other Ambulatory Visit: Payer: Self-pay | Admitting: *Deleted

## 2011-06-29 MED ORDER — FUROSEMIDE 40 MG PO TABS: 80.0000 mg | ORAL_TABLET | Freq: Every day | ORAL | Status: DC

## 2011-06-29 MED ORDER — METOPROLOL TARTRATE 50 MG PO TABS: 50.0000 mg | ORAL_TABLET | Freq: Two times a day (BID) | ORAL | Status: DC

## 2011-06-29 MED ORDER — AMLODIPINE BESYLATE 5 MG PO TABS: 5.0000 mg | ORAL_TABLET | Freq: Every day | ORAL | Status: DC

## 2011-07-01 ENCOUNTER — Ambulatory Visit (INDEPENDENT_AMBULATORY_CARE_PROVIDER_SITE_OTHER): Payer: Medicare Other | Admitting: Cardiology

## 2011-07-01 ENCOUNTER — Encounter: Payer: Self-pay | Admitting: Cardiology

## 2011-07-01 VITALS — BP 133/68 | HR 71 | Resp 18 | Ht 63.0 in | Wt 192.8 lb

## 2011-07-01 DIAGNOSIS — I359 Nonrheumatic aortic valve disorder, unspecified: Secondary | ICD-10-CM

## 2011-07-01 DIAGNOSIS — E785 Hyperlipidemia, unspecified: Secondary | ICD-10-CM | POA: Diagnosis not present

## 2011-07-01 DIAGNOSIS — I4891 Unspecified atrial fibrillation: Secondary | ICD-10-CM

## 2011-07-01 DIAGNOSIS — I10 Essential (primary) hypertension: Secondary | ICD-10-CM

## 2011-07-01 DIAGNOSIS — Z954 Presence of other heart-valve replacement: Secondary | ICD-10-CM

## 2011-07-01 DIAGNOSIS — Z951 Presence of aortocoronary bypass graft: Secondary | ICD-10-CM

## 2011-07-01 NOTE — Patient Instructions (Signed)
Your physician wants you to follow-up in: 12 months with Dr Shelda Pal will receive a reminder letter in the mail two months in advance. If you don't receive a letter, please call our office to schedule the follow-up appointment.   Your physician has requested that you have an echocardiogram. Echocardiography is a painless test that uses sound waves to create images of your heart. It provides your doctor with information about the size and shape of your heart and how well your heart's chambers and valves are working. This procedure takes approximately one hour. There are no restrictions for this procedure.

## 2011-07-01 NOTE — Assessment & Plan Note (Signed)
Patient remains in sinus rhythm. Continue aspirin. Not a Coumadin candidate given history of falls.

## 2011-07-01 NOTE — Progress Notes (Signed)
HPI: [name] is a pleasant  female who has a history of coronary disease status post bypassing graft, history of thoracic aortic aneurysm repair, as well as aortic valve replacement and paroxysmal atrial fibrillation.  Last echocardiogram in Sept 2011 revealed normal LV function with ejection fraction of 60-65%.  There was a bioprosthetic aortic valve with a mean gradient of 18 mmHg.  There was mild  left atrial enlargement. Last Myoview was performed on September 02, 2006. At that time she had normal perfusion with an ejection fraction of 76%. Carotid Dopplers performed in Sept 2011 revealed normal carotids. Note she has also had problems with dementia with psychotic features. I last saw her in September of 2011. Since then the patient has dyspnea with more extreme activities but not with routine activities. It is relieved with rest. It is not associated with chest pain. There is no orthopnea, PND or pedal edema. There is no syncope or palpitations. There is no exertional chest pain.  Current Outpatient Prescriptions  Medication Sig Dispense Refill  . amLODipine (NORVASC) 5 MG tablet Take 1 tablet (5 mg total) by mouth daily.  90 tablet  1  . aspirin 325 MG tablet Take 325 mg by mouth daily.      . betamethasone dipropionate 0.05 % lotion Apply topically 2 (two) times daily.  60 mL  5  . calcium carbonate (TUMS - DOSED IN MG ELEMENTAL CALCIUM) 500 MG chewable tablet Chew 2 tablets by mouth daily as needed.       . Cholecalciferol (VITAMIN D) 2000 UNITS tablet Take 2,000 Units by mouth daily.        . citalopram (CELEXA) 20 MG tablet Take 20 mg by mouth daily.        . folic acid (FOLVITE) 1 MG tablet TAKE 1 TABLET EVERY DAY  30 tablet  10  . furosemide (LASIX) 40 MG tablet Take 2 tablets (80 mg total) by mouth daily.  180 tablet  1  . metoprolol (LOPRESSOR) 50 MG tablet Take 1 tablet (50 mg total) by mouth 2 (two) times daily.  180 tablet  1  . omeprazole (PRILOSEC) 20 MG capsule Take 20 mg by mouth  as needed.        . polyethylene glycol powder (MIRALAX) powder Take 17 g by mouth as needed.        . pravastatin (PRAVACHOL) 40 MG tablet TAKE ONE TABLET BY MOUTH AT BEDTIME.  30 tablet  11  . risperiDONE (RISPERDAL) 0.25 MG tablet Take 1 tablet by mouth at bedtime.      . senna-docusate (SENOKOT-S) 8.6-50 MG per tablet Take 2 tablets by mouth daily as needed.          Past Medical History  Diagnosis Date  . Unspecified hearing loss   . Unspecified chronic bronchitis   . Unspecified essential hypertension   . Coronary atherosclerosis of unspecified type of vessel, native or graft   . Heart valve replaced by other means   . Atrial fibrillation   . Personal history of unspecified circulatory disease   . Occlusion and stenosis of carotid artery without mention of cerebral infarction   . Other and unspecified hyperlipidemia   . Esophageal reflux   . Diverticulosis of colon (without mention of hemorrhage)   . Unspecified hemorrhoids without mention of complication   . Unspecified disorder resulting from impaired renal function   . Osteoarthrosis, unspecified whether generalized or localized, unspecified site   . Osteoporosis, unspecified   . Other  persistent mental disorders due to conditions classified elsewhere   . Anxiety state, unspecified   . Anemia, unspecified     Past Surgical History  Procedure Date  . Total hip arthroplasty     left  . Vesicovaginal fistula closure w/ tah   . Intraocular lens insertion     bilateral  . Coronary artery bypass graft   . Aortic valve replacement   . Cataract extraction     bilateral    History   Social History  . Marital Status: Married    Spouse Name: [name]    Number of Children: N/A  . Years of Education: N/A   Occupational History  . retired    Social History Main Topics  . Smoking status: Never Smoker   . Smokeless tobacco: Never Used  . Alcohol Use: No  . Drug Use: No  . Sexually Active: Not on file   Other Topics  Concern  . Not on file   Social History Narrative  . No narrative on file    ROS: Right hip pain but no fevers or chills, productive cough, hemoptysis, dysphasia, odynophagia, melena, hematochezia, dysuria, hematuria, rash, seizure activity, orthopnea, PND, pedal edema, claudication. Remaining systems are negative.  Physical Exam: Well-developed well-nourished in no acute distress.  Skin is warm and dry.  HEENT is normal.  Neck is supple. No thyromegaly.  Chest is clear to auscultation with normal expansion.  Cardiovascular exam is regular rate and rhythm. 2/6 systolic murmur left sternal border. No diastolic murmur. Abdominal exam nontender or distended. No masses palpated. Extremities show trace edema. neuro grossly intact  ECG sinus rhythm at a rate of 64. First degree AV block. Left anterior fascicular block. Right bundle branch block. Lateral infarct. Left ventricular hypertrophy.

## 2011-07-01 NOTE — Assessment & Plan Note (Signed)
Continue SBE prophylaxis. Repeat echocardiogram. 

## 2011-07-01 NOTE — Assessment & Plan Note (Signed)
Blood pressure controlled. Continue present medications. Potassium and renal function monitored by primary care. 

## 2011-07-01 NOTE — Assessment & Plan Note (Signed)
Continue statin. Lipids and liver monitored by primary care. 

## 2011-07-01 NOTE — Assessment & Plan Note (Signed)
Continue aspirin and statin. Conservative measures given her overall age and medical condition.

## 2011-07-02 ENCOUNTER — Other Ambulatory Visit: Payer: Self-pay | Admitting: Pulmonary Disease

## 2011-07-02 NOTE — Progress Notes (Signed)
Addended by: Reine Just on: 07/02/2011 02:11 PM   Modules accepted: Orders

## 2011-07-14 ENCOUNTER — Other Ambulatory Visit: Payer: Self-pay

## 2011-07-14 ENCOUNTER — Ambulatory Visit (HOSPITAL_COMMUNITY): Payer: Medicare Other | Attending: Cardiology

## 2011-07-14 DIAGNOSIS — I4891 Unspecified atrial fibrillation: Secondary | ICD-10-CM | POA: Insufficient documentation

## 2011-07-14 DIAGNOSIS — I359 Nonrheumatic aortic valve disorder, unspecified: Secondary | ICD-10-CM | POA: Diagnosis not present

## 2011-07-14 DIAGNOSIS — I519 Heart disease, unspecified: Secondary | ICD-10-CM | POA: Diagnosis not present

## 2011-07-14 DIAGNOSIS — Z954 Presence of other heart-valve replacement: Secondary | ICD-10-CM | POA: Insufficient documentation

## 2011-07-14 DIAGNOSIS — I517 Cardiomegaly: Secondary | ICD-10-CM | POA: Diagnosis not present

## 2011-07-14 DIAGNOSIS — I10 Essential (primary) hypertension: Secondary | ICD-10-CM

## 2011-08-19 DIAGNOSIS — H903 Sensorineural hearing loss, bilateral: Secondary | ICD-10-CM | POA: Diagnosis not present

## 2011-08-19 DIAGNOSIS — H6120 Impacted cerumen, unspecified ear: Secondary | ICD-10-CM | POA: Diagnosis not present

## 2011-08-22 ENCOUNTER — Encounter (HOSPITAL_COMMUNITY): Payer: Self-pay | Admitting: Emergency Medicine

## 2011-08-22 ENCOUNTER — Emergency Department (INDEPENDENT_AMBULATORY_CARE_PROVIDER_SITE_OTHER)
Admission: EM | Admit: 2011-08-22 | Discharge: 2011-08-22 | Disposition: A | Discharge status: Home / Self Care | Payer: Medicare Other | Source: Home / Self Care | Attending: Emergency Medicine | Admitting: Emergency Medicine

## 2011-08-22 DIAGNOSIS — J209 Acute bronchitis, unspecified: Secondary | ICD-10-CM | POA: Diagnosis not present

## 2011-08-22 MED ORDER — DOXYCYCLINE HYCLATE 100 MG PO TABS: 100.0000 mg | ORAL_TABLET | Freq: Two times a day (BID) | ORAL | Status: AC

## 2011-08-22 MED ORDER — ALBUTEROL SULFATE HFA 108 (90 BASE) MCG/ACT IN AERS: 1.0000 | INHALATION_SPRAY | Freq: Four times a day (QID) | RESPIRATORY_TRACT | Status: DC | PRN

## 2011-08-22 MED ORDER — BENZONATATE 200 MG PO CAPS: 200.0000 mg | ORAL_CAPSULE | Freq: Three times a day (TID) | ORAL | Status: AC | PRN

## 2011-08-22 NOTE — ED Notes (Signed)
Onset 4 days ago of cold symptoms.  Reports chest congestion, intermittent wheezing.  Patient reports blowing sinus congestion, tan in color.  Productive cough, brown phlegm, denies fever.

## 2011-08-22 NOTE — ED Provider Notes (Signed)
Chief Complaint  Patient presents with  . URI    History of Present Illness:   The patient is an 78 year old female with a one-week history of cough productive of brown sputum and slight wheezing. She denies fever, chills, or sweats. Her appetite has been good there has been no weight loss. She denies any shortness of breath or chest pain. No abdominal pain, nausea, vomiting, or ankle edema. She denies nasal congestion, rhinorrhea, or sore throat.  Review of Systems:  Other than noted above, the patient denies any of the following symptoms. Systemic:  No fever, chills, sweats, fatigue, myalgias, headache, or anorexia. Eye:  No redness, pain or drainage. ENT:  No earache, ear congestion, nasal congestion, sneezing, rhinorrhea, sinus pressure, sinus pain, post nasal drip, or sore throat. Lungs:  No cough, sputum production, wheezing, shortness of breath, or chest pain. GI:  No abdominal pain, nausea, vomiting, or diarrhea. Skin:  No rash or itching.  PMFSH:  Past medical history, family history, social history, meds, and allergies were reviewed.  Physical Exam:   Vital signs:  BP 136/53  Pulse 67  Temp(Src) 98.3 F (36.8 C) (Oral)  Resp 22  SpO2 98% General:  Alert, in no distress. Eye:  No conjunctival injection or drainage. Lids were normal. ENT:  TMs and canals were normal, without erythema or inflammation.  Nasal mucosa was clear and uncongested, without drainage.  Mucous membranes were moist.  Pharynx was clear, without exudate or drainage.  There were no oral ulcerations or lesions. Neck:  Supple, no adenopathy, tenderness or mass. Lungs:  No respiratory distress.  Lungs were clear to auscultation, without wheezes, rales or rhonchi.  Breath sounds were clear and equal bilaterally. Lungs were resonant to percussion.  No egophony. Heart:  Regular rhythm, without gallops, murmers or rubs. Skin:  Clear, warm, and dry, without rash or lesions.  Assessment:  The encounter diagnosis was  Acute bronchitis.  Plan:   1.  The following meds were prescribed:   New Prescriptions   ALBUTEROL (PROVENTIL HFA;VENTOLIN HFA) 108 (90 BASE) MCG/ACT INHALER    Inhale 1-2 puffs into the lungs every 6 (six) hours as needed for wheezing.   BENZONATATE (TESSALON) 200 MG CAPSULE    Take 1 capsule (200 mg total) by mouth 3 (three) times daily as needed for cough.   DOXYCYCLINE (VIBRA-TABS) 100 MG TABLET    Take 1 tablet (100 mg total) by mouth 2 (two) times daily.   2.  The patient was instructed in symptomatic care and handouts were given. 3.  The patient was told to return if becoming worse in any way, if no better in 3 or 4 days, and given some red flag symptoms that would indicate earlier return.   Reuben Likes, MD 08/22/11 2221

## 2011-08-22 NOTE — Discharge Instructions (Signed)

## 2011-09-22 DIAGNOSIS — F0390 Unspecified dementia without behavioral disturbance: Secondary | ICD-10-CM | POA: Diagnosis not present

## 2011-09-22 DIAGNOSIS — F05 Delirium due to known physiological condition: Secondary | ICD-10-CM | POA: Diagnosis not present

## 2011-09-29 ENCOUNTER — Other Ambulatory Visit: Payer: Self-pay | Admitting: Pulmonary Disease

## 2011-11-18 DIAGNOSIS — H903 Sensorineural hearing loss, bilateral: Secondary | ICD-10-CM | POA: Diagnosis not present

## 2011-11-18 DIAGNOSIS — H905 Unspecified sensorineural hearing loss: Secondary | ICD-10-CM | POA: Diagnosis not present

## 2011-11-18 DIAGNOSIS — H6120 Impacted cerumen, unspecified ear: Secondary | ICD-10-CM | POA: Diagnosis not present

## 2011-12-22 DIAGNOSIS — I442 Atrioventricular block, complete: Secondary | ICD-10-CM

## 2011-12-22 HISTORY — DX: Atrioventricular block, complete: I44.2

## 2011-12-25 ENCOUNTER — Encounter: Payer: Self-pay | Admitting: *Deleted

## 2011-12-28 ENCOUNTER — Ambulatory Visit (INDEPENDENT_AMBULATORY_CARE_PROVIDER_SITE_OTHER): Payer: Medicare Other | Admitting: Pulmonary Disease

## 2011-12-28 ENCOUNTER — Encounter: Payer: Self-pay | Admitting: Pulmonary Disease

## 2011-12-28 ENCOUNTER — Other Ambulatory Visit (INDEPENDENT_AMBULATORY_CARE_PROVIDER_SITE_OTHER): Payer: Medicare Other

## 2011-12-28 VITALS — BP 118/72 | HR 58 | Temp 97.1°F | Ht 63.0 in | Wt 191.2 lb

## 2011-12-28 DIAGNOSIS — Z23 Encounter for immunization: Secondary | ICD-10-CM | POA: Diagnosis not present

## 2011-12-28 DIAGNOSIS — I10 Essential (primary) hypertension: Secondary | ICD-10-CM

## 2011-12-28 DIAGNOSIS — M545 Low back pain: Secondary | ICD-10-CM

## 2011-12-28 DIAGNOSIS — I4891 Unspecified atrial fibrillation: Secondary | ICD-10-CM

## 2011-12-28 DIAGNOSIS — M1990 Unspecified osteoarthritis, unspecified site: Secondary | ICD-10-CM

## 2011-12-28 DIAGNOSIS — Z954 Presence of other heart-valve replacement: Secondary | ICD-10-CM

## 2011-12-28 DIAGNOSIS — Z951 Presence of aortocoronary bypass graft: Secondary | ICD-10-CM

## 2011-12-28 DIAGNOSIS — I6529 Occlusion and stenosis of unspecified carotid artery: Secondary | ICD-10-CM

## 2011-12-28 DIAGNOSIS — E785 Hyperlipidemia, unspecified: Secondary | ICD-10-CM

## 2011-12-28 DIAGNOSIS — M810 Age-related osteoporosis without current pathological fracture: Secondary | ICD-10-CM

## 2011-12-28 DIAGNOSIS — N259 Disorder resulting from impaired renal tubular function, unspecified: Secondary | ICD-10-CM

## 2011-12-28 DIAGNOSIS — I38 Endocarditis, valve unspecified: Secondary | ICD-10-CM

## 2011-12-28 DIAGNOSIS — I2510 Atherosclerotic heart disease of native coronary artery without angina pectoris: Secondary | ICD-10-CM | POA: Diagnosis not present

## 2011-12-28 DIAGNOSIS — F411 Generalized anxiety disorder: Secondary | ICD-10-CM

## 2011-12-28 DIAGNOSIS — F068 Other specified mental disorders due to known physiological condition: Secondary | ICD-10-CM

## 2011-12-28 LAB — BASIC METABOLIC PANEL
CO2: 29 mEq/L (ref 19–32)
Glucose, Bld: 88 mg/dL (ref 70–99)
Potassium: 4.4 mEq/L (ref 3.5–5.1)
Sodium: 137 mEq/L (ref 135–145)

## 2011-12-28 NOTE — Patient Instructions (Addendum)
Today we updated your med list in our EPIC system...    Continue your current medications the same...  Today we did your follow up metabolic panel...    We will call you w/ the results...  We also gave you the 2013 Flu vaccine...  Call for any questions...  Let's plan a follow up visit in 6 months w/ CXR & FASTING blood work.Marland KitchenMarland Kitchen

## 2011-12-28 NOTE — Progress Notes (Signed)
Subjective:    Patient ID: [name], female    DOB: 25-Mar-1923, 78 y.o.   MRN: [id number]  HPI 78 y/o WF w/ mult med problems here for a follow up visit...   ~  May 26, 2010:  15mo ROV c/o right hip pain & she has an upcoming appt w/ DrAlusio;  s/p left THR in 2004;  she doesn't want anything for pain- just a handicap application for her car- OK...    >She saw DrCrenshaw 9/11 for f/u HBP, CAD s/p CABG & AVR w/ thor aneurysm repair, & hx PAF (she follows SBE prophylaxis protocols, but not a coumadin candidate due to falls)> stable, no changes made;  2DEcho 9/11 showed mild LVH, normal wall motion w/ EF=60-65%, AoV prosthesis looks good, RV mild dil & RV function sl reduced;  CDopplers 9/11 showed norm looking carotids w/ good flow but tort distal ICAs...    >BP remains well controlled at 124/68 on her 3 meds;  she denies CP, palpit, dizziness, SOB, ch in edema, etc;  Chol looks good on the Prav40;  mild renal insuffic w/ Creat 1.5;  she still sees DrPlovsky every 53mo...  ~  November 26, 2010:  15mo ROV & she reports doing well overall; we reviewed her meds & she requests Rx for Betamethasone cream for her scalp- ok;  Labs essentially wnl- BUN=35, Creat=1.4, Hg=12.9    >BP controlled on Metop, Amlodip, Lasix w/ BP= 122/70; she denies CP, palpit, ch in dyspnea, edema, etc; known CAD w/ CABG 2003 along w/ tissue AVR & Mabeline Caras AA repair; Hx PAF but holding NSR...    >She saw DrGioffre 4/12 for right hip pain gradually worse, hx prev leftTHR~2004, XRays showed severe degen arthritis & he rec surg but she is reluctant; alternatively he rec Tylenol & use a cane...    >Hx stroke w/ tortuous vessels on CDoppler analysis by [facility]; Hx Dementia & prev Delirium w/ psychotic features; now followed by DrPlovsky & back on Celexa (she was hearing "singing" & improved back on Rx).  ~  May 27, 2011:  15mo ROV & she states she is doing well & has no new complaints or concerns> but weight is up 10# to 192# today and she  notes some right hip pain for which she takes Tylenol; she ambulates w/ walker but does ok w/ her ADLs she says...    HBP> on Metop50Bid, Amlodip5, Lasix40- 2tabs daily; BP= 120/66 & she denies CP, palpit, syncope, SOB, edema...    CAD> s/p CABG 2003, followed by DrCrenshaw & last seen 9/11 (see below); we will refer for her overdue yearly f/u visit...    Valv heart dis> s/p tissue AVR & ThorAA repair in 2003; she remains too sedentary & rec to incr her exercise as able; Hx PAF in past but holding NSR x years now.    Carotid art dis & Hx TIA> on ASA 325mg /d; prev eval & rx by DrSethi; she was on Coumadin in past but this was stopped 2009 in [place]...    CHOL> on Prav40; FLP shows TChol 188, TG 269, HDL 47, LDL 96; needs better low fat diet...    Ortho> c/o right hip pain w/ known severe degen arthritis- she has declined surg (s/p prev left THR- DrAlusio 2004); uses Tylenol as needed; ambulates w/ cane/ walker...    Dementia/ Psyche> on Risperdone0.25Qhs and Celexa20mg /d; followed by [facility] for [facility]... LABS 3/13:  FLP- at goals x TG=269;  Chems- ok w/ Creat=1.4;  CBC- wnl;  TSH=2.97;  BNP=109  ~  December 28, 2011:  68mo ROV & [name] says she's feeling well, no new complaints;  BP stable 7 controlled on Metop50Bid, Amlod5, Lasix40-2/d> BP= 118/72 & remains asymptomatic;  DrCrenshaw follows for CAD- last seen 4/13> s/p CABG & ThorAA repair w/ AVR, Hx PAF(holding NSR on ASA), etc; she knows about SBE prophy etc...  Chol has been ok on Prav40;  She does note some right hip area pain- on Tylenol alone & uses walker, rec f/u by Ortho for further eval...  She continues to f/u w/ DrPlovsky for Hx dementia w/ psychotic features- on Celexa & Risperdal...    We reviewed prob list, meds, xrays and labs> see below for updates >> OK Flu vaccine today... LABS 10/13 showed BMet- stable w/ BUN=26, Creat=1.4          Problem List:  HEARING LOSS - ENT eval 9/10 by DrTeoh...  BRONCHITIS, RECURRENT  (ICD-491.9) >> see above... Hx of PNEUMONIA (ICD-486)  HYPERTENSION (ICD-401.9) - on ASA 325mg /d, LOPRESSOR 50mg Bid, NORVASC 5mg /d, & LASIX 40mg - 2tabsQam... BP=118/72 & doing well... denies HA, visual changes, CP, palipit, dizziness, syncope, change in dyspnea, or incr edema...  CORONARY ARTERY DISEASE (ICD-414.00) - S/P CABG 2003 w/ vein graft to PDA... no CP, palpit, or change in dyspnea... she is still pretty sedentary... followed by Aletta Edouard for Cards... ~   last NuclearStressTest 6/08 showed RBBB, no ischemia, EF=76%... ~  labs 9/09 showed BNP= 353 on Lasix 80mg /d. ~  labs 1/10 showed BNP= 204 ~  saw DrCrenshaw 9/10 w/ 2DEcho- mild LVH, EF=60%, wall motion norm, tissue AoV prosthesis- OK. ~  f/u DrCrenshaw 9/11 & he felt she was stable & ordered repeat CDoppler & 2DEcho> reviewed... ~  f/u DrCrenshaw 4/13 reviewed & stable...  Hx of VALVULAR HEART DISEASE (ICD-424.90) - S/P tissue AVR & Thor AA repair 2003. ~  she knows about the need for SBE prophylaxis. ~  2DEcho 3/09 showed a bioprosthetic valve w/ 21mm gradient, & norm LVF w/ EF= 55-60%. ~  2DEcho 9/10- mild LVH, EF=60%, wall motion norm, tissue AoV prosthesis- working well... ~  2DEcho 9/11 showed mild LVH, normal wall motion w/ EF=60-65%, AoV prosthesis looks good, RV mild dil & RV function sl reduced. ~  2DEcho 4/13 showed mild LVH, norm LVF- no regional wall motion abn & EF=60-65%, redundant septum (borderline aneurysm) Gr1DD, mildMR.  Hx of ATRIAL FIBRILLATION (ICD-427.31) - PAF prev on coumadin & stopped 2/09... she is holding NSR...  CEREBROVASCULAR ACCIDENT, HX OF (ICD-V12.50) - Eval and Rx DrSethi... She remains on ASA 325mg /d. ~  CDopplers 10/08 w/ tort ICA's, no plaque, normal carotids. ~  CD[facility]lers 4/10 at [facility] read by [facility] showed bilat 40-59% ICA stenoses believed to be from tortuosity of vessels. ~  CDopplers 9/11 showed norm looking carotids w/ good flow but tort distal ICAs...  HYPERLIPIDEMIA (ICD-272.4) -  she stopped the Crestor 5mg  again> she will agree to try PRAV40 since it is cheaper. ~  FLP 1/08 showed TChol 128, TG 94, HDL 40, LDL 69... ~  FLP 1/10 on diet showed TChol 247, TG 200, HDL 40, LDL 166... rec> start Crestor5. ~  FLP 7/10 on Cres5 showed TChol 157, TG 202, HDL 42, LDL 85... rec> continue same. ~  11/10: pt reports that she stopped the Crestor ?why- ?mild symptoms, advised to restart Rx. ~  FLP 3/11 on diet alone showed TChol 258, TG 359, HDL 49, LDL 140... start PRAV40. ~  FLP 9/11  on Prav40 showed TChol 210, TG 364, HDL 41, LDL 103... needs better low fat diet. ~  It has been hard to get her in FASTING for blood work. ~  FLP 3/13 on Prav40 showed TChol 188, TG 269, HDL 47, LDL 96... Needs better low fat diet & wt reduction.  GERD (ICD-530.81) - on OMEPRAZOLE 20mg /d Prn & controls her symptoms.  DIVERTICULOSIS OF COLON (ICD-562.10) & HEMORRHOIDS (ICD-455.6) - uses Miralax Prn... ~  last colon 10/00 [facility] w/ divertics, hems, cecal AVM.  RENAL INSUFFICIENCY (ICD-588.9) & Hx of UTI (ICD-599.0) ~  labs 3/09 showed BUN= 20, Creat= 1.4 ~  labs 9/09 showed BUN= 17, Creat= 1.1 ~  labs 7/10 showed BUN= 21, Creat= 1.4 ~  labs 3/11 showed BUN= 17, Creat= 1.2 ~  labs 9/11 showed BUN= 22, Creat= 1.3 ~  Labs 3/12 showed BUN= 32, Creat= 1.5 ~  Labs 9/12 showed BUN= 35, Creat= 1.4 ~  Labs 3/13 showed BUN= 26, Creat= 1.4 ~  Labs 10/13 showed BUN= 26, Creat= 1.4  DEGENERATIVE JOINT DISEASE (ICD-715.90) - s/p left THR 7/04 by DrAlusio. ~  labs 7/10 showed Vit D level = 30... rec to start Vit D 1000 u daily OTC. ~  labs 3/11 showed Vit D level = 26... rec incr Vit D to 2000 u daily.  BACK PAIN, LUMBAR (ICD-724.2) - Hosp 4/10 after fall at home w/ L1 compression fx & retropulsed fragment in canal- eval by DrElsner w/ rec for conservative management w/ brace, rest, rehab...  DEMENTIA (ICD-294.8) - Prev hx DELIRIUM w/ Abn MRI showing atrophy, sm vessel dis, old infarct... then hosp 12/08  w/ aud hallucinations & psych rx from DrWilliford w/ Risperdal, Depakote, Ativan... 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 she is now stable on a complex psychotopic regimen per [facility], Psychiatry... see med list... ~  1/10:  now off Risperdal... on Depakote 250mg /d, Celexa 20mg - 1.5tabsQhs. ~  6/10:  now off Depakote, on Celexa alone & followed by DrPlovsky. ~  3/11:  She states that "my mind is back where it should be" & "I don't have dementia";  states she's driving now & I cautioned her and her husb about this. ~  9/12:  Followed by DrPlovsky & back on Celexa (she was hearing "singing" & improved back on Rx). ~  3/13 & 10/13:  Stable on Risperdal & Celexa from DrPlovsky...  ANXIETY (ICD-300.00)  ANEMIA (ICD-285.9) ~  labs 3/09 showed Hg= 11.7 ~  labs 9/09 showed Hg 12.2 ~  labs 1/10 showed Hg= 12.8 ~  labs 7/10 showed Hg= 13.0 ~  labs 3/11 showed Hg= 12.8 ~  Labs 3/12 showed Hg= 12.8 ~  Labs 9/12 showed Hg= 12.9 ~  Labs 3/13 showed Hg= 13.1   Past Surgical History  Procedure Date  . Total hip arthroplasty     left  . Vesicovaginal fistula closure w/ tah   . Intraocular lens insertion     bilateral  . Coronary artery bypass graft   . Aortic valve replacement   . Cataract extraction     bilateral    Outpatient Encounter Prescriptions as of 12/28/2011  Medication Sig Dispense Refill  . acetaminophen (TYLENOL) 325 MG tablet Take 650  mg by mouth every 6 (six) hours as needed.      Marland Kitchen albuterol (PROVENTIL HFA;VENTOLIN HFA) 108 (90 BASE) MCG/ACT inhaler Inhale 1-2 puffs into the lungs every 6 (six) hours as needed for wheezing.  1 Inhaler  0  . amLODipine (NORVASC) 5 MG tablet Take 1 tablet (5 mg total)  by mouth daily.  90 tablet  1  . aspirin 325 MG tablet Take 325 mg by mouth daily.      . betamethasone dipropionate 0.05 % lotion Apply topically 2 (two) times daily.  60 mL  5  . calcium carbonate (TUMS - DOSED IN MG ELEMENTAL CALCIUM) 500 MG chewable tablet Chew 2 tablets by mouth daily as needed.       . Cholecalciferol (VITAMIN D) 2000 UNITS tablet Take 2,000 Units by mouth daily.        . citalopram (CELEXA) 20 MG tablet Take 20 mg by mouth daily.        . folic acid (FOLVITE) 1 MG tablet TAKE 1 TABLET EVERY DAY  30 tablet  11  . furosemide (LASIX) 40 MG tablet Take 2 tablets (80 mg total) by mouth daily.  180 tablet  1  . furosemide (LASIX) 40 MG tablet TAKE 2 TABLETS EVERY MORNING  60 tablet  4  . metoprolol (LOPRESSOR) 50 MG tablet Take 1 tablet (50 mg total) by mouth 2 (two) times daily.  180 tablet  1  . omeprazole (PRILOSEC) 20 MG capsule Take 20 mg by mouth as needed.        . polyethylene glycol powder (MIRALAX) powder Take 17 g by mouth as needed.        . pravastatin (PRAVACHOL) 40 MG tablet TAKE ONE TABLET BY MOUTH AT BEDTIME.  30 tablet  11  . Pseudoeph-Doxylamine-DM-APAP (NYQUIL PO) Take by mouth.      . risperiDONE (RISPERDAL) 0.25 MG tablet Take 1 tablet by mouth at bedtime.      . senna-docusate (SENOKOT-S) 8.6-50 MG per tablet Take 2 tablets by mouth daily as needed.         Allergies  Allergen Reactions  . Clarithromycin     REACTION: abd pain and nausea  . Iohexol      Desc: sob with ivp approx 20 yrs ago, pt was premedicated with 13hr prep with no problems kdean, Onset Date: [id number]   . Penicillins     REACTION: abd pain and nausea  . Prednisone     REACTION: swelling and nausea  . Statins     REACTION: abd pain and dark stools  . Sulfonamide Derivatives     REACTION: abd pain and nausea    Current Medications, Allergies, Past Medical History, Past Surgical History, Family History, and Social History were reviewed in [employer]  record.    Review of Systems        See HPI - all other systems neg except as noted...  The patient complains of decreased hearing, dyspnea on exertion, peripheral edema, incontinence, and difficulty walking.  The patient denies anorexia, fever, weight loss, weight gain, vision loss, hoarseness, chest pain, syncope, prolonged cough, headaches, hemoptysis, abdominal pain, melena, hematochezia, severe indigestion/heartburn, hematuria, muscle weakness, suspicious skin lesions, transient blindness, depression, unusual weight change, abnormal bleeding, enlarged lymph nodes, and angioedema.     Objective:   Physical Exam     WD, sl overweight, 78 y/o WF in NAD... flattened affect apparent, without change... GENERAL:  Alert & oriented x3... HEENT:  [place]/AT, EOM-wnl,  PERRLA, EACs-clear, TMs-wnl, NOSE-clear, THROAT- sl dry, clear & wnl. NECK:  Supple w/ fairROM; no JVD; normal carotid impulses w/o bruits; no thyromegaly or nodules palpated; no lymphadenopathy. CHEST:  Clear to P & A; without wheezes/ rales/ or rhonchi heard... mild congested cough... HEART: s/p med sternotomy, regular,  gr1/6 SEM without rubs or gallops apprec... ABDOMEN:  Soft & nontender; normal bowel sounds; no organomegaly or masses detected. EXT: without deformities, s/p left THR, mod arthritic changes; no varicose veins/ +venous insuffic/ tr edema is noted... NEURO:  CN's intact;  Hx back brace, L1 compression, no focal neuro changes... DERM:  No lesions noted; no rash etc...  RADIOLOGY DATA:  Reviewed in the EPIC EMR & discussed w/ the patient...    >>CXR 4/10 showed prev surg w/ CABG/ AVR; no acute changes...  LABORATORY DATA:  Reviewed in the EPIC EMR & discussed w/ the patient...    >>LABS 3/13:  FLP- at goals x TG=269;  Chems- ok w/ Creat=1.4;  CBC- wnl;  TSH=2.97;  BNP=109   Assessment & Plan:    HBP>  BP controlled on BBlocker, CCB, Diuretic; continue same + low sodium diet etc...  Cardiac> CAD, s/p CABG, s/p AVR  tissue valve & ThorAA repair>  Followed by Aletta Edouard for Cards...  Hx PAF>  Holding NSR & she is supposed to be on ASA daily...  Hyperlipidemia>  On Prav40 + low chol, low fat diet & FLP shows good chol parameters but elev TG- needs better low fat diet.  GI> GERD, Divertics, Hems>  On Omep, Miralax, etc as needed...  Renal Insuffic>  Stable w/ Creat in the 1.4-1.5 range...  DJD/ s/p leftTHR>  Followed by DrGioffre, DrAlusio et al; she needs right THR as well but declines surg & treating w/ support, cane, Tylenol...  LBP>  Hx fall w/ L1 fx & eval by DrElsner who rec conservative management...  DEMENTIA>  She has hx delirium w/ hallucinations & psyche rx; now followed by DrPlovsky on Celexa...  Other medical problems as noted...   Patient's Medications  New Prescriptions   No medications on file  Previous Medications   ACETAMINOPHEN (TYLENOL) 325 MG TABLET    Take 650 mg by mouth every 6 (six) hours as needed.   ALBUTEROL (PROVENTIL HFA;VENTOLIN HFA) 108 (90 BASE) MCG/ACT INHALER    Inhale 1-2 puffs into the lungs every 6 (six) hours as needed for wheezing.   AMLODIPINE (NORVASC) 5 MG TABLET    Take 1 tablet (5 mg total) by mouth daily.   ASPIRIN 325 MG TABLET    Take 325 mg by mouth daily.   BETAMETHASONE DIPROPIONATE 0.05 % LOTION    Apply topically 2 (two) times daily.   CALCIUM CARBONATE (TUMS - DOSED IN MG ELEMENTAL CALCIUM) 500 MG CHEWABLE TABLET    Chew 2 tablets by mouth daily as needed.    CHOLECALCIFEROL (VITAMIN D) 2000 UNITS TABLET    Take 2,000 Units by mouth daily.     CITALOPRAM (CELEXA) 20 MG TABLET    Take 20 mg by mouth daily.     FOLIC ACID (FOLVITE) 1 MG TABLET    TAKE 1 TABLET EVERY DAY   FUROSEMIDE (LASIX) 40 MG TABLET    Take 2 tablets (80 mg total) by mouth daily.   FUROSEMIDE (LASIX) 40 MG TABLET    TAKE 2 TABLETS EVERY MORNING   METOPROLOL (LOPRESSOR) 50 MG TABLET    Take 1 tablet (50 mg total) by mouth 2 (two) times daily.  OMEPRAZOLE (PRILOSEC) 20 MG  CAPSULE    Take 20 mg by mouth as needed.     POLYETHYLENE GLYCOL POWDER (MIRALAX) POWDER    Take 17 g by mouth as needed.     PRAVASTATIN (PRAVACHOL) 40 MG TABLET    TAKE ONE TABLET BY MOUTH AT BEDTIME.   PSEUDOEPH-DOXYLAMINE-DM-APAP (NYQUIL PO)    Take by mouth.   RISPERIDONE (RISPERDAL) 0.25 MG TABLET    Take 1 tablet by mouth at bedtime.   SENNA-DOCUSATE (SENOKOT-S) 8.6-50 MG PER TABLET    Take 2 tablets by mouth daily as needed.   Modified Medications   No medications on file  Discontinued Medications   No medications on file

## 2012-01-05 ENCOUNTER — Other Ambulatory Visit: Payer: Self-pay | Admitting: Pulmonary Disease

## 2012-01-07 DIAGNOSIS — H903 Sensorineural hearing loss, bilateral: Secondary | ICD-10-CM | POA: Diagnosis not present

## 2012-01-07 DIAGNOSIS — H905 Unspecified sensorineural hearing loss: Secondary | ICD-10-CM | POA: Diagnosis not present

## 2012-01-11 ENCOUNTER — Encounter (HOSPITAL_COMMUNITY): Payer: Self-pay | Admitting: Cardiology

## 2012-01-11 ENCOUNTER — Inpatient Hospital Stay (HOSPITAL_COMMUNITY): Payer: Medicare Other

## 2012-01-11 ENCOUNTER — Inpatient Hospital Stay (HOSPITAL_COMMUNITY)
Admission: AD | Admit: 2012-01-11 | Discharge: 2012-01-13 | DRG: 244 | Disposition: A | Discharge status: Home / Self Care | Payer: Medicare Other | Source: Ambulatory Visit | Attending: Cardiovascular Disease | Admitting: Cardiovascular Disease

## 2012-01-11 ENCOUNTER — Encounter: Payer: Self-pay | Admitting: Cardiology

## 2012-01-11 ENCOUNTER — Ambulatory Visit (INDEPENDENT_AMBULATORY_CARE_PROVIDER_SITE_OTHER): Payer: Medicare Other | Admitting: Cardiology

## 2012-01-11 ENCOUNTER — Telehealth: Payer: Self-pay | Admitting: Cardiology

## 2012-01-11 VITALS — BP 130/74 | HR 58 | Ht 62.0 in | Wt 192.0 lb

## 2012-01-11 DIAGNOSIS — I38 Endocarditis, valve unspecified: Secondary | ICD-10-CM | POA: Diagnosis present

## 2012-01-11 DIAGNOSIS — Z823 Family history of stroke: Secondary | ICD-10-CM

## 2012-01-11 DIAGNOSIS — Z888 Allergy status to other drugs, medicaments and biological substances status: Secondary | ICD-10-CM

## 2012-01-11 DIAGNOSIS — I441 Atrioventricular block, second degree: Secondary | ICD-10-CM | POA: Diagnosis present

## 2012-01-11 DIAGNOSIS — Z7982 Long term (current) use of aspirin: Secondary | ICD-10-CM | POA: Diagnosis not present

## 2012-01-11 DIAGNOSIS — K219 Gastro-esophageal reflux disease without esophagitis: Secondary | ICD-10-CM | POA: Diagnosis present

## 2012-01-11 DIAGNOSIS — I2510 Atherosclerotic heart disease of native coronary artery without angina pectoris: Secondary | ICD-10-CM | POA: Diagnosis present

## 2012-01-11 DIAGNOSIS — I4891 Unspecified atrial fibrillation: Secondary | ICD-10-CM | POA: Diagnosis present

## 2012-01-11 DIAGNOSIS — I442 Atrioventricular block, complete: Secondary | ICD-10-CM | POA: Diagnosis not present

## 2012-01-11 DIAGNOSIS — Z880 Allergy status to penicillin: Secondary | ICD-10-CM

## 2012-01-11 DIAGNOSIS — Z8249 Family history of ischemic heart disease and other diseases of the circulatory system: Secondary | ICD-10-CM

## 2012-01-11 DIAGNOSIS — E785 Hyperlipidemia, unspecified: Secondary | ICD-10-CM | POA: Diagnosis present

## 2012-01-11 DIAGNOSIS — M810 Age-related osteoporosis without current pathological fracture: Secondary | ICD-10-CM | POA: Diagnosis present

## 2012-01-11 DIAGNOSIS — Z952 Presence of prosthetic heart valve: Secondary | ICD-10-CM | POA: Diagnosis not present

## 2012-01-11 DIAGNOSIS — Z833 Family history of diabetes mellitus: Secondary | ICD-10-CM

## 2012-01-11 DIAGNOSIS — Z950 Presence of cardiac pacemaker: Secondary | ICD-10-CM | POA: Diagnosis not present

## 2012-01-11 DIAGNOSIS — I4430 Unspecified atrioventricular block: Secondary | ICD-10-CM | POA: Diagnosis not present

## 2012-01-11 DIAGNOSIS — Z951 Presence of aortocoronary bypass graft: Secondary | ICD-10-CM

## 2012-01-11 DIAGNOSIS — R55 Syncope and collapse: Secondary | ICD-10-CM | POA: Diagnosis not present

## 2012-01-11 DIAGNOSIS — F411 Generalized anxiety disorder: Secondary | ICD-10-CM | POA: Diagnosis present

## 2012-01-11 DIAGNOSIS — Z882 Allergy status to sulfonamides status: Secondary | ICD-10-CM

## 2012-01-11 DIAGNOSIS — Z96649 Presence of unspecified artificial hip joint: Secondary | ICD-10-CM

## 2012-01-11 DIAGNOSIS — J90 Pleural effusion, not elsewhere classified: Secondary | ICD-10-CM | POA: Diagnosis not present

## 2012-01-11 DIAGNOSIS — I10 Essential (primary) hypertension: Secondary | ICD-10-CM | POA: Diagnosis not present

## 2012-01-11 DIAGNOSIS — Z79899 Other long term (current) drug therapy: Secondary | ICD-10-CM

## 2012-01-11 HISTORY — DX: Essential (primary) hypertension: I10

## 2012-01-11 HISTORY — DX: Atherosclerotic heart disease of native coronary artery without angina pectoris: I25.10

## 2012-01-11 HISTORY — DX: Thoracic aortic aneurysm, without rupture: I71.2

## 2012-01-11 HISTORY — DX: Presence of prosthetic heart valve: Z95.2

## 2012-01-11 HISTORY — DX: Hyperlipidemia, unspecified: E78.5

## 2012-01-11 HISTORY — DX: Atrioventricular block, complete: I44.2

## 2012-01-11 HISTORY — DX: Occlusion and stenosis of unspecified carotid artery: I65.29

## 2012-01-11 LAB — CBC WITH DIFFERENTIAL/PLATELET
Basophils Absolute: 0 10*3/uL (ref 0.0–0.1)
Basophils Relative: 0 % (ref 0–1)
Eosinophils Absolute: 0.1 10*3/uL (ref 0.0–0.7)
Eosinophils Relative: 1 % (ref 0–5)
HCT: 38.4 % (ref 36.0–46.0)
Lymphocytes Relative: 32 % (ref 12–46)
MCH: 29.6 pg (ref 26.0–34.0)
MCHC: 32.3 g/dL (ref 30.0–36.0)
MCV: 91.6 fL (ref 78.0–100.0)
Monocytes Absolute: 0.6 10*3/uL (ref 0.1–1.0)
Platelets: 240 10*3/uL (ref 150–400)
RDW: 15 % (ref 11.5–15.5)
WBC: 6.4 10*3/uL (ref 4.0–10.5)

## 2012-01-11 LAB — COMPREHENSIVE METABOLIC PANEL
ALT: 9 U/L (ref 0–35)
AST: 14 U/L (ref 0–37)
Alkaline Phosphatase: 102 U/L (ref 39–117)
CO2: 25 mEq/L (ref 19–32)
GFR calc Af Amer: 40 mL/min — ABNORMAL LOW (ref >90)
Glucose, Bld: 112 mg/dL — ABNORMAL HIGH (ref 70–99)
Potassium: 4.3 mEq/L (ref 3.5–5.1)
Sodium: 139 mEq/L (ref 135–145)
Total Protein: 7 g/dL (ref 6.0–8.3)

## 2012-01-11 LAB — TSH: TSH: 2.397 u[IU]/mL (ref 0.350–4.500)

## 2012-01-11 LAB — PROTIME-INR: Prothrombin Time: 13.4 seconds (ref 11.6–15.2)

## 2012-01-11 LAB — APTT: aPTT: 33 seconds (ref 24–37)

## 2012-01-11 LAB — MAGNESIUM: Magnesium: 2.5 mg/dL (ref 1.5–2.5)

## 2012-01-11 MED ORDER — ALPRAZOLAM 0.25 MG PO TABS: 0.2500 mg | ORAL_TABLET | Freq: Two times a day (BID) | ORAL | Status: DC | PRN

## 2012-01-11 MED ORDER — ACETAMINOPHEN 325 MG PO TABS: 650.0000 mg | ORAL_TABLET | ORAL | Status: DC | PRN

## 2012-01-11 MED ORDER — CITALOPRAM HYDROBROMIDE 20 MG PO TABS
20.0000 mg | ORAL_TABLET | Freq: Every day | ORAL | Status: DC
  Administered 2012-01-11 – 2012-01-13 (×2): 20 mg via ORAL
  Filled 2012-01-11 (×3): qty 1

## 2012-01-11 MED ORDER — VITAMIN D3 25 MCG (1000 UNIT) PO TABS
2000.0000 [IU] | ORAL_TABLET | Freq: Every day | ORAL | Status: DC
  Administered 2012-01-13: 2000 [IU] via ORAL
  Filled 2012-01-11 (×2): qty 2

## 2012-01-11 MED ORDER — NITROGLYCERIN 0.4 MG SL SUBL: 0.4000 mg | SUBLINGUAL_TABLET | SUBLINGUAL | Status: DC | PRN

## 2012-01-11 MED ORDER — SODIUM CHLORIDE 0.9 % IJ SOLN
3.0000 mL | Freq: Two times a day (BID) | INTRAMUSCULAR | Status: DC
  Administered 2012-01-11: 3 mL via INTRAVENOUS

## 2012-01-11 MED ORDER — ONDANSETRON HCL 4 MG/2ML IJ SOLN: 4.0000 mg | Freq: Four times a day (QID) | INTRAMUSCULAR | Status: DC | PRN

## 2012-01-11 MED ORDER — PANTOPRAZOLE SODIUM 40 MG PO TBEC
40.0000 mg | DELAYED_RELEASE_TABLET | Freq: Every day | ORAL | Status: DC
  Administered 2012-01-13: 40 mg via ORAL
  Filled 2012-01-11: qty 1

## 2012-01-11 MED ORDER — ACETAMINOPHEN 325 MG PO TABS: 650.0000 mg | ORAL_TABLET | Freq: Four times a day (QID) | ORAL | Status: DC | PRN

## 2012-01-11 MED ORDER — SENNOSIDES-DOCUSATE SODIUM 8.6-50 MG PO TABS
2.0000 | ORAL_TABLET | Freq: Every day | ORAL | Status: DC | PRN
  Filled 2012-01-11: qty 2

## 2012-01-11 MED ORDER — SODIUM CHLORIDE 0.9 % IJ SOLN: 3.0000 mL | INTRAMUSCULAR | Status: DC | PRN

## 2012-01-11 MED ORDER — RISPERIDONE 0.25 MG PO TABS
0.2500 mg | ORAL_TABLET | Freq: Every day | ORAL | Status: DC
  Administered 2012-01-11 – 2012-01-12 (×2): 0.25 mg via ORAL
  Filled 2012-01-11 (×3): qty 1

## 2012-01-11 MED ORDER — ASPIRIN 325 MG PO TABS
325.0000 mg | ORAL_TABLET | Freq: Every day | ORAL | Status: DC
  Administered 2012-01-12 – 2012-01-13 (×2): 325 mg via ORAL
  Filled 2012-01-11 (×2): qty 1

## 2012-01-11 MED ORDER — POLYETHYLENE GLYCOL 3350 17 GM/SCOOP PO POWD
17.0000 g | ORAL | Status: DC | PRN
  Filled 2012-01-11: qty 255

## 2012-01-11 MED ORDER — CALCIUM CARBONATE ANTACID 500 MG PO CHEW
2.0000 | CHEWABLE_TABLET | Freq: Every day | ORAL | Status: DC | PRN
  Filled 2012-01-11: qty 2

## 2012-01-11 MED ORDER — SIMVASTATIN 20 MG PO TABS
20.0000 mg | ORAL_TABLET | Freq: Every day | ORAL | Status: DC
  Administered 2012-01-11 – 2012-01-12 (×2): 20 mg via ORAL
  Filled 2012-01-11 (×3): qty 1

## 2012-01-11 MED ORDER — SODIUM CHLORIDE 0.9 % IV SOLN: 250.0000 mL | INTRAVENOUS | Status: DC | PRN

## 2012-01-11 MED ORDER — VITAMIN D 50 MCG (2000 UT) PO TABS: 2000.0000 [IU] | ORAL_TABLET | Freq: Every day | ORAL | Status: DC

## 2012-01-11 MED ORDER — ZOLPIDEM TARTRATE 5 MG PO TABS: 5.0000 mg | ORAL_TABLET | Freq: Every evening | ORAL | Status: DC | PRN

## 2012-01-11 MED ORDER — FOLIC ACID 1 MG PO TABS
1.0000 mg | ORAL_TABLET | Freq: Every day | ORAL | Status: DC
  Administered 2012-01-13: 1 mg via ORAL
  Filled 2012-01-11 (×3): qty 1

## 2012-01-11 MED ORDER — FUROSEMIDE 80 MG PO TABS
80.0000 mg | ORAL_TABLET | Freq: Every day | ORAL | Status: DC
  Administered 2012-01-12 – 2012-01-13 (×2): 80 mg via ORAL
  Filled 2012-01-11 (×2): qty 1

## 2012-01-11 NOTE — Telephone Encounter (Signed)
Spoke with pt's husband who reports pt woke up this morning and felt dizzy. Sat on side of bed for awhile and then got up and ate breakfast. She is alert and up and moving around but very weak when walking.  Had to sit down when walking from kitchen to bathroom.  She is complaining of heart beating rapidly and skipping.  Also with shortness of breath which started during night.  Husband checked blood pressure and pulse--135/64 and 44.  Dizziness has improved.  Taking amlodipine and metoprolol as on med list. Will review with Dr. Patty Sermons.

## 2012-01-11 NOTE — Progress Notes (Signed)
This 78 year old woman patient of Dr. Jens Som is seen as an urgent work in an office at [facility] today.  She comes in because of a one day history of extreme dizziness with near syncope.  She has a past history of paroxysmal atrial fibrillation but on her recent office visits has been in normal sinus rhythm and has been on metoprolol.  EKG in the office today shows high-grade AV block.  This may be related to medication.  The patient has had severe dizziness and near syncope but no chest pain.  We are admitting to telemetry bed for further evaluation.  No lab work was done in the office today.

## 2012-01-11 NOTE — Telephone Encounter (Signed)
Pt requesting appt today having rapid heartbeats since last night, pls advise, crenshaw not in this week

## 2012-01-11 NOTE — Telephone Encounter (Signed)
Reviewed with Dr. Patty Sermons and he will see pt today at 1:15. I called and spoke with husband and he will bring pt in for appt today.  He is aware if pt's symptoms worsen prior to this she should go to ED.

## 2012-01-11 NOTE — H&P (Signed)
History and Physical   Patient ID: [name] MRN: [id number], DOB/AGE: Aug 13, 1923 78 y.o. Date of Encounter: 01/11/2012  Primary Physician: Michele Mcalpine, MD Primary Cardiologist: [facility]  Chief Complaint:  Weakness, bradycardia  HPI: [name] is an 78 year old female with a history of coronary artery disease. She has been on a beta blocker for years. Within the last 2 weeks, she has noted increasing fatigue and weakness. This became much worse in the last 24-48 hours. She has had no energy. She has also had some dyspnea on exertion. She describes orthopnea and PND, but not lower extremity swelling. She has not been short of breath at rest. She has had no chest pain. She had dizziness as well as near syncope that was orthostatic in nature. She was evaluated by Dr. Patty Sermons today and her ECG showed complete heart block. She was sent to the hospital as a direct admit. Currently she is in complete heart block with a ventricular rate in the 30s, but maintaining her blood pressure and mentating well. No recent palpitations/rapid heart rates.  Past Medical History  Diagnosis Date  . Unspecified hearing loss   . Unspecified chronic bronchitis   . Unspecified essential hypertension   . Coronary atherosclerosis of unspecified type of vessel, native or graft   . Heart valve replaced by other means   . Atrial fibrillation   . Personal history of unspecified circulatory disease   . Occlusion and stenosis of carotid artery without mention of cerebral infarction   . Other and unspecified hyperlipidemia   . Esophageal reflux   . Diverticulosis of colon (without mention of hemorrhage)   . Unspecified hemorrhoids without mention of complication   . Unspecified disorder resulting from impaired renal function   . Osteoarthrosis, unspecified whether generalized or localized, unspecified site   . Osteoporosis, unspecified   . Other persistent mental disorders due to conditions classified elsewhere   .  Anxiety state, unspecified   . Anemia, unspecified      Surgical History:  Past Surgical History  Procedure Date  . Total hip arthroplasty     left  . Vesicovaginal fistula closure w/ tah   . Intraocular lens insertion     bilateral  . Coronary artery bypass graft   . Aortic valve replacement - #21 pericardial tissue valve, SVG-PDA   . Cataract extraction     bilateral     I have reviewed the patient's current medications. Medication Sig  acetaminophen (TYLENOL) 325 MG tablet Take 650 mg by mouth every 6 (six) hours as needed.  amLODipine (NORVASC) 5 MG tablet TAKE 1 TABLET (5 MG TOTAL) BY MOUTH DAILY.  aspirin 325 MG tablet Take 325 mg by mouth daily.  calcium carbonate (TUMS - DOSED IN MG ELEMENTAL CALCIUM) 500 MG chewable tablet Chew 2 tablets by mouth daily as needed.   Cholecalciferol (VITAMIN D) 2000 UNITS tablet Take 2,000 Units by mouth daily.    citalopram (CELEXA) 20 MG tablet Take 20 mg by mouth daily.    folic acid (FOLVITE) 1 MG tablet TAKE 1 TABLET EVERY DAY  furosemide (LASIX) 40 MG tablet TAKE 2 TABLETS EVERY MORNING  metoprolol (LOPRESSOR) 50 MG tablet Take 1 tablet (50 mg total) by mouth 2 (two) times daily.  omeprazole (PRILOSEC) 20 MG capsule Take 20 mg by mouth as needed.    polyethylene glycol powder (MIRALAX) powder Take 17 g by mouth as needed.    pravastatin (PRAVACHOL) 40 MG tablet TAKE ONE TABLET BY MOUTH AT BEDTIME.  risperiDONE (RISPERDAL) 0.25 MG tablet Take 1 tablet by mouth at bedtime.  senna-docusate (SENOKOT-S) 8.6-50 MG per tablet Take 2 tablets by mouth daily as needed.    Allergies:  Allergies  Allergen Reactions  . Clarithromycin     REACTION: abd pain and nausea  . Iohexol      Desc: sob with ivp approx 20 yrs ago, pt was premedicated with 13hr prep with no problems kdean, Onset Date: [id number]   . Penicillins     REACTION: abd pain and nausea  . Prednisone     REACTION: swelling and nausea  . Statins     REACTION: abd pain and  dark stools  . Sulfonamide Derivatives     REACTION: abd pain and nausea    History   Social History  . Marital Status: Married    Spouse Name: [name]    Number of Children: N/A  . Years of Education: N/A   Occupational History  . retired    Social History Main Topics  . Smoking status: Never Smoker   . Smokeless tobacco: Never Used  . Alcohol Use: No  . Drug Use: No  . Sexually Active: Not on file   Other Topics Concern  . Not on file   Social History Narrative  . Lives with husband. Mother had kidney problems. Father had coronary artery disease. Both died when the patient was young. She does not know how old they were. She has one brother who died at age 61 of a CVA, although, he had coronary artery disease and diabetes. Another brother died at age 19 of a stroke. He also had coronary artery disease. One sister is living, she has coronary artery disease and diabetes and one sister has anginal symptoms.       Family History  Problem Relation Age of Onset  . Diabetes Brother   . Diabetes Sister    Review of Systems:   Full 14-point review of systems otherwise negative except as noted above.   Physical Exam: Blood pressure 133/35, pulse 33, temperature 97.2 F (36.2 C), temperature source Oral, resp. rate 20, SpO2 97.00%. General: Well developed, well nourished, female in no acute distress. Head: Normocephalic, atraumatic, sclera non-icteric, no xanthomas, nares are without discharge. Dentition: poor Neck: No carotid bruits. JVD not elevated. No thyromegally Lungs: Good expansion bilaterally. without wheezes or rhonchi.  Clear bilaterally  Heart: Regular rate and rhythm with S1 S2.  No S3 or S4.  2/6 systolic murmur, no rubs, or gallops appreciated. Abdomen: Soft, non-tender, non-distended with normoactive bowel sounds. No hepatomegaly. No rebound/guarding. No obvious abdominal masses. Msk:  Strength and tone appear normal for age. No joint deformities or effusions, no  spine or costo-vertebral angle tenderness. Extremities: No clubbing or cyanosis. No edema.  Distal pedal pulses are 2+ in 4 extrem Neuro: Alert and oriented X 3. Moves all extremities spontaneously. No focal deficits noted. Psych:  Responds to questions appropriately with a normal affect. Skin: No rashes or lesions noted  Labs: Pending  Radiology/Studies:  pending   Cardiac Cath: 11/08/2001 CONCLUSION:  1. Large ascending aortic aneurysm with moderate aortic insufficiency.  2. Coronary artery disease with 40% narrowing in the proximal left anterior  descending, no major obstruction in the circumflex artery, and tandem 70%  stenoses in the distal right coronary artery.  Echo: 07/14/2011 Study Conclusions - Left ventricle: The cavity size was normal. Wall thickness was increased in a pattern of mild LVH. There was mild focal basal hypertrophy of the  septum. Systolic function was normal. The estimated ejection fraction was in the range of 60% to 65%. Wall motion was normal; there were no regional wall motion abnormalities. Doppler parameters are consistent with abnormal left ventricular relaxation (grade 1 diastolic dysfunction). - Aortic valve: A bioprosthesis was present. Mean gradient: 18mm Hg (S). Peak gradient: 28mm Hg (S). - Mitral valve: Mild regurgitation. - Left atrium: The atrium was mildly dilated. - Atrial septum: There was redundancy of the septum, with borderline criteria for aneurysm.  ECG: Complete heart block, ventricular rate in the 30s, P waves are present.  ASSESSMENT AND PLAN:  Principal Problem:  *Complete heart block - Hold metoprolol. Will confirm that her last dose of metoprolol was this am. Continue to follow her closely on telemetry. The risks and benefits of pacemaker insertion were discussed with the patient and her husband. These include but are not limited to pneumothorax and bleeding. The patient was reassured that this is not similar to the open  heart surgery she had before. If a pacemaker is indicated, she is willing to have it done. As she is mentating at baseline, and maintaining her blood pressure, we can delay the procedure until her beta blocker has had time to wear off.  She will be continued on her other home medications with the exception of amlodipine, until the need for a pacemaker has been determined. Active Problems:  HYPERTENSION  CORONARY ARTERY DISEASE  VALVULAR HEART DISEASE  Signed,  Theodore Demark PA-C 01/11/2012, 3:03 PM  Patient seen, examined. Available data reviewed. Agree with findings, assessment, and plan as outlined by Theodore Demark, PA-C. The patient was independently interviewed and examined. All data was reviewed, including her EKG tracings, lab work, and radiographic data. Her labs and chest x-ray are currently pending. The patient has developed symptomatic complete AV block with heart rate in the 30s. She is adequately mentating and remains hemodynamically stable. I agree that it is appropriate to hold all AV nodal blockers. We will also hold amlodipine to prevent hypotension. I suspect she will require a permanent pacemaker and this was reviewed with the patient and her husband today. We will allow metoprolol wash out of her system over the next 24 hours and consideration will be made for permanent pacing either tomorrow or the following day. I discussed her case with Dr. Johney Frame over the telephone. If she develops progressive rhythm instability or hemodynamic instability, she may require a temporary transvenous pacemaker. However, at this time a temporary pacer does not seem indicated.  Tonny Bollman, M.D. 01/11/2012 4:29 PM

## 2012-01-12 ENCOUNTER — Encounter (HOSPITAL_COMMUNITY)
Admission: AD | Disposition: A | Discharge status: Home / Self Care | Payer: Self-pay | Source: Ambulatory Visit | Attending: Cardiovascular Disease

## 2012-01-12 DIAGNOSIS — R55 Syncope and collapse: Secondary | ICD-10-CM

## 2012-01-12 DIAGNOSIS — I442 Atrioventricular block, complete: Secondary | ICD-10-CM

## 2012-01-12 HISTORY — PX: PERMANENT PACEMAKER INSERTION: SHX5480

## 2012-01-12 HISTORY — PX: PACEMAKER INSERTION: SHX728

## 2012-01-12 LAB — TROPONIN I: Troponin I: <0.3 ng/mL (ref <0.30)

## 2012-01-12 SURGERY — PERMANENT PACEMAKER INSERTION
Anesthesia: LOCAL

## 2012-01-12 MED ORDER — SODIUM CHLORIDE 0.9 % IR SOLN
80.0000 mg | Status: DC
  Filled 2012-01-12: qty 2

## 2012-01-12 MED ORDER — VANCOMYCIN HCL IN DEXTROSE 1-5 GM/200ML-% IV SOLN
1000.0000 mg | INTRAVENOUS | Status: DC
  Filled 2012-01-12: qty 200

## 2012-01-12 MED ORDER — ONDANSETRON HCL 4 MG/2ML IJ SOLN: 4.0000 mg | Freq: Four times a day (QID) | INTRAMUSCULAR | Status: DC | PRN

## 2012-01-12 MED ORDER — SODIUM CHLORIDE 0.9 % IJ SOLN: 3.0000 mL | Freq: Two times a day (BID) | INTRAMUSCULAR | Status: DC

## 2012-01-12 MED ORDER — AMLODIPINE BESYLATE 5 MG PO TABS
5.0000 mg | ORAL_TABLET | Freq: Every day | ORAL | Status: DC
  Administered 2012-01-12 – 2012-01-13 (×2): 5 mg via ORAL
  Filled 2012-01-12 (×2): qty 1

## 2012-01-12 MED ORDER — VANCOMYCIN HCL IN DEXTROSE 1-5 GM/200ML-% IV SOLN
1000.0000 mg | Freq: Two times a day (BID) | INTRAVENOUS | Status: AC
  Administered 2012-01-12: 1000 mg via INTRAVENOUS
  Filled 2012-01-12: qty 200

## 2012-01-12 MED ORDER — SODIUM CHLORIDE 0.45 % IV SOLN
INTRAVENOUS | Status: DC
  Administered 2012-01-12: 09:00:00 via INTRAVENOUS

## 2012-01-12 MED ORDER — HEPARIN (PORCINE) IN NACL 2-0.9 UNIT/ML-% IJ SOLN
INTRAMUSCULAR | Status: AC
  Filled 2012-01-12: qty 500

## 2012-01-12 MED ORDER — LIDOCAINE HCL (PF) 1 % IJ SOLN
INTRAMUSCULAR | Status: AC
  Filled 2012-01-12: qty 60

## 2012-01-12 MED ORDER — HYDROCODONE-ACETAMINOPHEN 5-325 MG PO TABS: 1.0000 | ORAL_TABLET | ORAL | Status: DC | PRN

## 2012-01-12 MED ORDER — ACETAMINOPHEN 325 MG PO TABS
325.0000 mg | ORAL_TABLET | ORAL | Status: DC | PRN
  Administered 2012-01-12: 650 mg via ORAL
  Filled 2012-01-12: qty 2

## 2012-01-12 MED ORDER — CHLORHEXIDINE GLUCONATE 4 % EX LIQD
60.0000 mL | Freq: Once | CUTANEOUS | Status: DC
  Filled 2012-01-12: qty 60

## 2012-01-12 MED ORDER — SODIUM CHLORIDE 0.9 % IJ SOLN: 3.0000 mL | INTRAMUSCULAR | Status: DC | PRN

## 2012-01-12 MED ORDER — SODIUM CHLORIDE 0.9 % IV SOLN: 250.0000 mL | INTRAVENOUS | Status: DC

## 2012-01-12 NOTE — Op Note (Signed)
SURGEON:  Hillis Range, MD     PREPROCEDURE DIAGNOSIS:  Symptomatic complete heart block    POSTPROCEDURE DIAGNOSIS:  Symptomatic complete heart block     PROCEDURES:   1. Left upper extremity venography.   2. Pacemaker implantation.     INTRODUCTION: [name] is a 78 y.o. female with a history of bifascicular block with subsequent who presents today for pacemaker implantation.  She was admitted with mobitz II second degree AV block.  Despite washout of beta blockers, her AV block has advanced to complete heart block.  The patient reports intermittent episodes of dizziness over the past few weeks.  No reversible causes have been identified.  The patient therefore presents today for pacemaker implantation.     DESCRIPTION OF PROCEDURE:  Informed written consent was obtained, and the patient was brought to the electrophysiology lab in a fasting state.  The patient required no sedation for the procedure today.  The patients left chest was prepped and draped in the usual sterile fashion by the EP lab staff. The skin overlying the left deltopectoral region was infiltrated with lidocaine for local analgesia.  A 4-cm incision was made over the left deltopectoral region.  A left subcutaneous pacemaker pocket was fashioned using a combination of sharp and blunt dissection. Electrocautery was required to assure hemostasis.    Left Upper Extremity Venography: A venogram of the left upper extremity was performed, which revealed a small left cephalic vein, which emptied into a large left subclavian vein.  The left axillary vein was moderate in size.    RA/RV Lead Placement: The left axilalry vein was therefore cannulated.  Through the left axillary vein, a [facility] model [phone] (serial number  [id number]) right atrial lead and a [facility] model 1948- 58 (serial number [id number]) right ventricular lead were advanced with fluoroscopic visualization into the right atrial appendage and right  ventricular apex positions respectively.  Initial atrial lead P- waves measured 6 mV with impedance of 479 ohms and a threshold of 0.6 V at 0.5 msec.  Right ventricular lead R-waves measured 7 mV with an impedance of 714 ohms and a threshold of 0.7 V at 0.5 msec.  Both leads were secured to the pectoralis fascia using #2-0 silk over the suture sleeves.   Device Placement:  The leads were then connected to a [employer] DR RF model [id number]  (serial number [id number]) pacemaker.  The pocket was irrigated with copious gentamicin solution.  The pacemaker was then placed into the pocket.  The pocket was then closed in 2 layers with 2.0 Vicryl suture for the subcutaneous and subcuticular layers.  Steri-   Strips and a sterile dressing were then applied.  There were no early apparent complications.     CONCLUSIONS:   1. Successful implantation of a [employer] RF dual-chamber pacemaker for symptomatic complete heart block  2. No early apparent complications.           Hillis Range, MD 01/12/2012 11:59 AM

## 2012-01-12 NOTE — Progress Notes (Signed)
Utilization Review Completed.[name] T10/22/2013   

## 2012-01-12 NOTE — Consult Note (Signed)
ELECTROPHYSIOLOGY CONSULT NOTE    Patient ID: [name] MRN: [id number], DOB/AGE: [birth year] 78 y.o.  Admit date: 01/11/2012 Date of Consult: 01-12-2012  Primary Physician: Michele Mcalpine, MD Primary Cardiologist: Olga Millers, MD  Reason for Consultation: high grade heart block and pre-syncope  HPI:  [name] is an 78 year old female with a history of coronary artery disease. She has been on a beta blocker for years and is felt to require these for CAD and afib. Within the last 2 weeks, she has noted increasing fatigue and weakness. This became much worse in the last 24-48 hours. She has had no energy. She has also had some dyspnea on exertion. She describes orthopnea and PND, but not lower extremity swelling. She has not been short of breath at rest. She has had no chest pain. She had dizziness as well as near syncope.  Because of this, she was evaluated in the office 01-11-2012.  At that time, EKG demonstrated high grade heart block and she was admitted to [facility] for further evaluation.  Her Beta-blocker has been held with persistent heart block documented on telemetry.  She has had no further dizziness of pre-syncope since being admitted despite heart rates persisting in the 20's-40's.   EP has been asked to evaluate for need for permanent pacemaker.    Past Medical History  Diagnosis Date  . Unspecified hearing loss   . Unspecified chronic bronchitis   . Unspecified essential hypertension   . Coronary atherosclerosis of unspecified type of vessel, native or graft   . Heart valve replaced by other means   . Atrial fibrillation   . Personal history of unspecified circulatory disease   . Occlusion and stenosis of carotid artery without mention of cerebral infarction   . Other and unspecified hyperlipidemia   . Esophageal reflux   . Diverticulosis of colon (without mention of hemorrhage)   . Unspecified hemorrhoids without mention of complication   . Unspecified disorder  resulting from impaired renal function   . Osteoarthrosis, unspecified whether generalized or localized, unspecified site   . Osteoporosis, unspecified   . Other persistent mental disorders due to conditions classified elsewhere   . Anxiety state, unspecified   . Anemia, unspecified      Surgical History:  Past Surgical History  Procedure Date  . Total hip arthroplasty     left  . Vesicovaginal fistula closure w/ tah   . Intraocular lens insertion     bilateral  . Coronary artery bypass graft   . Aortic valve replacement   . Cataract extraction     bilateral     Prescriptions prior to admission  Medication Sig Dispense Refill  . acetaminophen (TYLENOL) 325 MG tablet Take 650 mg by mouth every 6 (six) hours as needed.      Marland Kitchen amLODipine (NORVASC) 5 MG tablet Take 5 mg by mouth daily.      Marland Kitchen aspirin 325 MG tablet Take 325 mg by mouth daily.      . calcium carbonate (TUMS - DOSED IN MG ELEMENTAL CALCIUM) 500 MG chewable tablet Chew 2 tablets by mouth daily as needed.       . Cholecalciferol (VITAMIN D) 2000 UNITS tablet Take 2,000 Units by mouth daily.        . citalopram (CELEXA) 20 MG tablet Take 20 mg by mouth daily.        . folic acid (FOLVITE) 1 MG tablet TAKE 1 TABLET EVERY DAY  30 tablet  11  .  furosemide (LASIX) 40 MG tablet Take 80 mg by mouth daily.      . metoprolol (LOPRESSOR) 50 MG tablet Take 1 tablet (50 mg total) by mouth 2 (two) times daily.  180 tablet  1  . omeprazole (PRILOSEC) 20 MG capsule Take 20 mg by mouth as needed.        . polyethylene glycol powder (MIRALAX) powder Take 17 g by mouth as needed.        . pravastatin (PRAVACHOL) 40 MG tablet Take 40 mg by mouth at bedtime.      . risperiDONE (RISPERDAL) 0.25 MG tablet Take 1 tablet by mouth at bedtime.      . senna-docusate (SENOKOT-S) 8.6-50 MG per tablet Take 2 tablets by mouth daily as needed.         Inpatient Medications:    . aspirin  325 mg Oral Daily  . cholecalciferol  2,000 Units Oral Daily   . citalopram  20 mg Oral Daily  . folic acid  1 mg Oral Daily  . furosemide  80 mg Oral Daily  . pantoprazole  40 mg Oral Daily  . risperiDONE  0.25 mg Oral QHS  . simvastatin  20 mg Oral q1800  . sodium chloride  3 mL Intravenous Q12H    Allergies:  Allergies  Allergen Reactions  . Clarithromycin     REACTION: abd pain and nausea  . Coumadin (Warfarin Sodium) Other (See Comments)    hallucinations   . Iohexol      Desc: sob with ivp approx 20 yrs ago, pt was premedicated with 13hr prep with no problems kdean, Onset Date: [id number]   . Penicillins     REACTION: abd pain and nausea  . Prednisone     REACTION: swelling and nausea  . Statins     REACTION: abd pain and dark stools  . Sulfonamide Derivatives     REACTION: abd pain and nausea    History   Social History  . Marital Status: Married    Spouse Name: [name]    Number of Children: N/A  . Years of Education: N/A   Occupational History  . retired    Social History Main Topics  . Smoking status: Never Smoker   . Smokeless tobacco: Never Used  . Alcohol Use: No  . Drug Use: No  . Sexually Active: Not on file   Other Topics Concern  . Not on file   Social History Narrative   Lives with husband. Mother had kidney problems.  Father had coronary artery  disease.  Both died when the patient was young.  She does not know how old  they were.  She has one brother who died at age 39 of a CVA, although, he  had coronary artery disease and diabetes.  Another brother died at age 45 of  a stroke.  He also had coronary artery disease.  One sister is living, she  has coronary artery disease and diabetes and one sister has anginal symptoms. Her sister has a pacemaker (history palpitations).      Family History  Problem Relation Age of Onset  . Diabetes Brother   . Diabetes Sister       Labs:   Lab Results  Component Value Date   WBC 6.4 01/11/2012   HGB 12.4 01/11/2012   HCT 38.4 01/11/2012   MCV 91.6 01/11/2012    PLT 240 01/11/2012    Lab 01/11/12 1651  NA 139  K 4.3  CL 101  CO2 25  BUN 27*  CREATININE 1.36*  CALCIUM 9.5  PROT 7.0  BILITOT 0.3  ALKPHOS 102  ALT 9  AST 14  GLUCOSE 112*    Physical Exam: Filed Vitals:   01/11/12 1500 01/11/12 1642 01/11/12 2024 01/12/12 0414  BP:  140/43 121/60 140/62  Pulse:   31 34  Temp:   97.9 F (36.6 C) 99.1 F (37.3 C)  TempSrc:   Oral Oral  Resp:   16 20  Height: 5\' 2"  (1.575 m)     Weight: 191 lb 12.8 oz (87 kg)   188 lb 4.8 oz (85.412 kg)  SpO2:   99% 96%    GEN- The patient is elderly appearing, alert and oriented x 3 today.   Head- normocephalic, atraumatic Eyes-  Sclera clear, conjunctiva pink Ears- hearing intact Oropharynx- clear Neck- supple  Lungs- Clear to ausculation bilaterally, normal work of breathing Heart- brady irregular rhythm,  no murmurs, rubs or gallops, PMI not laterally displaced GI- soft, NT, ND, + BS Extremities- no clubbing, cyanosis, or edema [name]- age appropriate atrophy Skin- no rash or lesion Psych- euthymic mood, full affect Neuro- strength and sensation are intact  Radiology/Studies: X-ray Chest Pa And Lateral 01/11/2012  *RADIOLOGY REPORT*  Clinical Data: Syncope.  History of hypertension, CABG.  CHEST - 2 VIEW  Comparison: CT of the chest 08/31/2006, chest x-ray 06/22/2008 and earlier  Findings: The patient has had median sternotomy and valve replacement.  Mediastinal width appears stable, consistent with known thoracic ectasia/aneurysm.  There is no focal consolidation. There are no pleural effusions.  Degenerative changes are seen in the spine.  IMPRESSION: Stable appearance of the chest.   Original Report Authenticated By: Patterson Hammersmith, M.D.    EKG: sinus with mobitz II AV block  TELEMETRY: mobitz II AV block with intermittent complete heart block  ECHO:  06-2011, EF 60-65%   Assessment and Plan: The patient has symptomatic mobitz II second degree AV block.  She previously had bifascicular  block.  Her AV block has not resolved despite metoprolol wash out.  In addition, she is felt to require beta blockers long term.  I would therefore recommend pacemaker implantation at this time.  Risks, benefits, alternatives to pacemaker implantation were discussed in detail with the patient today. The patient understands that the risks include but are not limited to bleeding, infection, pneumothorax, perforation, tamponade, vascular damage, renal failure, MI, stroke, death,  and lead dislodgement and wishes to proceed. We will therefore schedule the procedure at the next available time.

## 2012-01-12 NOTE — H&P (Signed)
See my consult note from today

## 2012-01-13 ENCOUNTER — Encounter (HOSPITAL_COMMUNITY): Payer: Self-pay | Admitting: Cardiology

## 2012-01-13 ENCOUNTER — Inpatient Hospital Stay (HOSPITAL_COMMUNITY): Payer: Medicare Other

## 2012-01-13 ENCOUNTER — Encounter: Payer: Self-pay | Admitting: *Deleted

## 2012-01-13 DIAGNOSIS — J90 Pleural effusion, not elsewhere classified: Secondary | ICD-10-CM | POA: Diagnosis not present

## 2012-01-13 DIAGNOSIS — Z950 Presence of cardiac pacemaker: Secondary | ICD-10-CM

## 2012-01-13 DIAGNOSIS — I442 Atrioventricular block, complete: Secondary | ICD-10-CM | POA: Diagnosis not present

## 2012-01-13 HISTORY — DX: Presence of cardiac pacemaker: Z95.0

## 2012-01-13 LAB — BASIC METABOLIC PANEL
BUN: 17 mg/dL (ref 6–23)
CO2: 23 mEq/L (ref 19–32)
Chloride: 105 mEq/L (ref 96–112)
Creatinine, Ser: 1.11 mg/dL — ABNORMAL HIGH (ref 0.50–1.10)
Glucose, Bld: 95 mg/dL (ref 70–99)
Potassium: 3.5 mEq/L (ref 3.5–5.1)

## 2012-01-13 NOTE — Progress Notes (Signed)
CARDIAC REHAB PHASE I   PRE:  Rate/Rhythm: 70 pacing    BP: sitting 140/80    SaO2: 96 RA  MODE:  Ambulation: 100 ft   POST:  Rate/Rhythm: 70 pacing    BP: sitting 160/70     SaO2: 97 RA  Pt stood with min assist and used rollator (uses at home). Pt became SOB walking. Rest x2 (standing) for 100 ft. Sts her SOB is the same as PTA. O/w she sts she feels well. Return to recliner. Encouraged walking daily with rollator. Reminded of restrictions. [id number]  [name] CES, ACSM

## 2012-01-13 NOTE — Discharge Summary (Signed)
ELECTROPHYSIOLOGY PROCEDURE DISCHARGE SUMMARY    Patient ID: [name],  MRN: [id number], DOB/AGE: 76-Jun-1925 76 y.o.  Admit date: 01/11/2012 Discharge date: 01/13/2012  Primary Care Physician: Alroy Dust, MD Primary Cardiologist: Olga Millers, MD  Primary Discharge Diagnosis:  Symptomatic complete heart block, status post pacemaker implantation this admission  Secondary Discharge Diagnosis:  1.  Hypertension 2.  History of atrial fibrillation, not felt to be a Coumadin candidate because of falls 3.  Coronary artery disease status post CABG 4.  Previous thoracic aortic aneurysm repair 5.  S/p aortic valve replacement with bioprosthetic aortic valve  Procedures This Admission:  1.  Implantation of a dual chamber pacemaker on 01-12-2012 by Dr Johney Frame.  The patient received a STJ model number 2210 pacemaker with model number 1944 right atrial lead and model number 1948 right ventricular lead.  There were no early apparent complications.  2.  Chest x-ray on 01-13-2012 demonstrated no pneumothorax status post pacemaker placement.   Brief HPI: [name] is an 78 year old female with a history of coronary artery disease. She has been on a beta blocker for years and is felt to require these for CAD and afib. Within the last 2 weeks, she has noted increasing fatigue and weakness. This became much worse in the last 24-48 hours. She has had no energy. She has also had some dyspnea on exertion. She describes orthopnea and PND, but not lower extremity swelling. She has not been short of breath at rest. She has had no chest pain. She had dizziness as well as near syncope. Because of this, she was evaluated in the office 01-11-2012. At that time, EKG demonstrated high grade heart block and she was admitted to [facility] for further evaluation.   Hospital Course:  The patient was admitted and monitored on telemetry.  Her Metoprolol was held and allowed to wash out.  She had persistent heart block and  was evaluated by Dr Johney Frame with electrophysiology.  Pacemaker implantation was recommended.  She underwent pacemaker implantation on 01-12-2012 with details as outlined above.  She was monitored on telemetry overnight which demonstrated sinus rhythm with ventricular pacing and conversion to atrial fibrillation with controlled ventricular response later in the morning.  She has a known history of atrial fib, but is not anticoagulated because of history of falls.  Her left chest was without hematoma or ecchymosis.  X-ray was obtained which demonstrated no pneumothorax.  Dr Johney Frame examined the patient and considered her stable for discharge to home after ambulation with cardiac rehab. Dr Johney Frame recommended that her Metoprolol continue to be held at discharge.   Discharge Vitals: Blood pressure 161/59, pulse 70, temperature 98.1 F (36.7 C), temperature source Oral, resp. rate 16, height 5\' 2"  (1.575 m), weight 188 lb 4.8 oz (85.412 kg), SpO2 97.00%.    Labs:   Lab Results  Component Value Date   WBC 6.4 01/11/2012   HGB 12.4 01/11/2012   HCT 38.4 01/11/2012   MCV 91.6 01/11/2012   PLT 240 01/11/2012     Lab 01/13/12 0530 01/11/12 1651  NA 141 --  K 3.5 --  CL 105 --  CO2 23 --  BUN 17 --  CREATININE 1.11* --  CALCIUM 8.8 --  PROT -- 7.0  BILITOT -- 0.3  ALKPHOS -- 102  ALT -- 9  AST -- 14  GLUCOSE 95 --   Discharge Medications:    Medication List     As of 01/13/2012  2:01 PM  STOP taking these medications         metoprolol 50 MG tablet   Commonly known as: LOPRESSOR      TAKE these medications         acetaminophen 325 MG tablet   Commonly known as: TYLENOL   Take 650 mg by mouth every 6 (six) hours as needed.      amLODipine 5 MG tablet   Commonly known as: NORVASC   Take 5 mg by mouth daily.      aspirin 325 MG tablet   Take 325 mg by mouth daily.      calcium carbonate 500 MG chewable tablet   Commonly known as: TUMS - dosed in mg elemental calcium   Chew  2 tablets by mouth daily as needed.      citalopram 20 MG tablet   Commonly known as: CELEXA   Take 20 mg by mouth daily.      folic acid 1 MG tablet   Commonly known as: FOLVITE   TAKE 1 TABLET EVERY DAY      furosemide 40 MG tablet   Commonly known as: LASIX   Take 80 mg by mouth daily.      MIRALAX powder   Generic drug: polyethylene glycol powder   Take 17 g by mouth as needed.      omeprazole 20 MG capsule   Commonly known as: PRILOSEC   Take 20 mg by mouth as needed.      pravastatin 40 MG tablet   Commonly known as: PRAVACHOL   Take 40 mg by mouth at bedtime.      risperiDONE 0.25 MG tablet   Commonly known as: RISPERDAL   Take 1 tablet by mouth at bedtime.      senna-docusate 8.6-50 MG per tablet   Commonly known as: Senokot-S   Take 2 tablets by mouth daily as needed.      Vitamin D 2000 UNITS tablet   Take 2,000 Units by mouth daily.          Disposition:  Discharge Orders    Future Appointments: Provider: Department: Dept Phone: Center:   01/21/2012 11:30 AM [facility] [phone] [facility]   04/21/2012 9:15 AM Hillis Range, MD [facility] [phone] [facility]   06/27/2012 11:00 AM Michele Mcalpine, MD [facility] [phone] None     Future Orders Please Complete By Expires   Diet - low sodium heart healthy      Increase activity slowly        Follow-up Information    Follow up with [facility]. On 01/21/2012. (Device/Wound Check @ 11:30)    Contact information:   [address] [place] [address] [phone]      Follow up with Hillis Range, MD. On 04/21/2012. (9:15)    Contact information:   [facility] [address] [place] [address] [phone]          Duration of Discharge Encounter: Greater than 30 minutes including physician time.  Darrol Jump, JESSICA,PA-C 01/13/2012, 2:01 PM   Hillis Range, MD

## 2012-01-13 NOTE — Care Management Note (Signed)
    Page 1 of 1   01/13/2012     4:28:50 PM   CARE MANAGEMENT NOTE 01/13/2012  Patient:  [name],[name]   Account Number:  [id number]  Date Initiated:  01/13/2012  Documentation initiated by:  Garnet Chatmon  Subjective/Objective Assessment:   PT ADM 3RD DEGREE HEARTBLOCK WITH HR IN THE 30S, REQUIRING PERMANENT PACEMAKER.  PTA, PT INDEPENDENT, LIVES WITH SPOUSE.     Action/Plan:   HUSBAND TO PROVIDE CARE AT DC.  NO DISCHARGE NEEDS.   Anticipated DC Date:  01/13/2012   Anticipated DC Plan:  HOME/SELF CARE      DC Planning Services  CM consult      Choice offered to / List presented to:             Status of service:  Completed, signed off Medicare Important Message given?   (If response is "NO", the following Medicare IM given date fields will be blank) Date Medicare IM given:   Date Additional Medicare IM given:    Discharge Disposition:  HOME/SELF CARE  Per UR Regulation:  Reviewed for med. necessity/level of care/duration of stay  If discussed at Long Length of Stay Meetings, dates discussed:    Comments:

## 2012-01-13 NOTE — Progress Notes (Signed)
   ELECTROPHYSIOLOGY ROUNDING NOTE    Patient Name: [name] Date of Encounter: 01-13-2012    SUBJECTIVE:Patient feels well.  No chest pain. + mild shortness of breath.  Thinks she might be a "little stronger" this morning. Status post pacemaker implantation 01-12-2012 for symptomatic complete heart block.  TELEMETRY: Reviewed telemetry pt in sinus rhythm with ventricular pacing initially, she has converted to afib this am but is rate controlled Filed Vitals:   01/12/12 1300 01/12/12 1400 01/12/12 2016 01/13/12 0535  BP: 142/59 129/47 153/60 161/59  Pulse: 71 71 75 70  Temp:   97.3 F (36.3 C) 98.1 F (36.7 C)  TempSrc:   Oral Oral  Resp:  20 16 16   Height:      Weight:      SpO2:  97% 96% 97%    Intake/Output Summary (Last 24 hours) at 01/13/12 4580 Last data filed at 01/13/12 0500  Gross per 24 hour  Intake      0 ml  Output    450 ml  Net   -450 ml   Physical Exam:  GEN- The patient is well appearing, alert and oriented x 3 today.   Head- normocephalic, atraumatic Eyes-  Sclera clear, conjunctiva pink Ears- hearing intact Oropharynx- clear Neck- supple, no JVP Chest- pacemaker pocket without hematoma Lungs- Clear to ausculation bilaterally, normal work of breathing Heart- Regular rate and rhythm, no murmurs, rubs or gallops, PMI not laterally displaced GI- soft, NT, ND, + BS Extremities- no clubbing, cyanosis, or edema    LABS: Basic Metabolic Panel:  Basename 01/11/12 1651  NA 139  K 4.3  CL 101  CO2 25  GLUCOSE 112*  BUN 27*  CREATININE 1.36*  CALCIUM 9.5  MG 2.5  PHOS --   Liver Function Tests:  [facility] 01/11/12 1651  AST 14  ALT 9  ALKPHOS 102  BILITOT 0.3  PROT 7.0  ALBUMIN 3.6   CBC:  Basename 01/11/12 1651  WBC 6.4  NEUTROABS 3.7  HGB 12.4  HCT 38.4  MCV 91.6  PLT 240   Cardiac Enzymes:  Basename 01/12/12 0440 01/11/12 2202 01/11/12 1651  CKTOTAL -- -- --  CKMB -- -- --  CKMBINDEX -- -- --  TROPONINI <0.30 <0.30  <0.30     Radiology/Studies:  No ptx,  leads in stable position.  DEVICE INTERROGATION: Device interrogation reviewed and normal (see paper chart).    Assessment and Plan:  1. Complete heart block - normal pacemaker function Wound care, arm mobility, restrictions reviewed with patient.    Routine follow up scheduled.  2. afib- rate controlled She has a known h/o afib for which she was previously felt to be a poor candidate for anticoagulation  Will ask cardiac rehab to see [facility] to DC to home later today if ambulatory  Discharge plans per Dr Johney Frame.

## 2012-01-13 NOTE — Progress Notes (Signed)
Discharged to home with family office visits in place teaching done Discharged to home with family office visits in place teaching done  

## 2012-01-18 ENCOUNTER — Telehealth: Payer: Self-pay | Admitting: Internal Medicine

## 2012-01-18 NOTE — Telephone Encounter (Signed)
Pt asking about the transmitter, dr allred said it would be here when they got home from hospital, pls call  [phone]

## 2012-01-19 NOTE — Telephone Encounter (Signed)
Pt's husband called back from yesterday's message, got in touch with kristen, she will enroll pt and transmitter will be mailed to pt in about 2 weeks, also had question re meds, told him we would go over med list with them at tomorrow's appt, husband verbilized uinderstanding

## 2012-01-21 ENCOUNTER — Ambulatory Visit (INDEPENDENT_AMBULATORY_CARE_PROVIDER_SITE_OTHER): Payer: Medicare Other | Admitting: *Deleted

## 2012-01-21 ENCOUNTER — Encounter: Payer: Self-pay | Admitting: Internal Medicine

## 2012-01-21 DIAGNOSIS — I442 Atrioventricular block, complete: Secondary | ICD-10-CM | POA: Diagnosis not present

## 2012-01-21 LAB — PACEMAKER DEVICE OBSERVATION
AL IMPEDENCE PM: 587.5 Ohm
ATRIAL PACING PM: 1
BAMS-0001: 150 {beats}/min
BAMS-0003: 70 {beats}/min
BATTERY VOLTAGE: 2.9629 V
VENTRICULAR PACING PM: 98

## 2012-01-21 NOTE — Progress Notes (Signed)
Wound check-PPM 

## 2012-01-26 ENCOUNTER — Telehealth: Payer: Self-pay | Admitting: *Deleted

## 2012-01-26 NOTE — Telephone Encounter (Signed)
Per Dr. Johney Frame, patient is to continue holding the metoprolol, patient aware.

## 2012-01-29 ENCOUNTER — Telehealth: Payer: Self-pay | Admitting: *Deleted

## 2012-01-29 NOTE — Telephone Encounter (Signed)
Pt's husband called for her, the pt wanted to know if it would be ok to get her hair done.  She was worried the hair dryer would affect her new pacemaker she had implanted on 10.22.2013.  I double checked with the device clinic, Gunnar Fusi confirmed it was ok for the pt do use the hairdryer at her beauty salon.  I informed the pt's husband it was ok, he stated it would relieve his wife's mind.  Caralee Ates, CMA

## 2012-03-07 ENCOUNTER — Other Ambulatory Visit: Payer: Self-pay | Admitting: Pulmonary Disease

## 2012-03-21 DIAGNOSIS — F0390 Unspecified dementia without behavioral disturbance: Secondary | ICD-10-CM | POA: Diagnosis not present

## 2012-04-14 ENCOUNTER — Telehealth: Payer: Self-pay | Admitting: Pulmonary Disease

## 2012-04-14 ENCOUNTER — Telehealth: Payer: Self-pay | Admitting: Cardiology

## 2012-04-14 MED ORDER — AZITHROMYCIN 250 MG PO TABS: ORAL_TABLET | ORAL | Status: DC

## 2012-04-14 NOTE — Telephone Encounter (Signed)
Called and spoke with pt and she is aware of SN recs---  zpak , take the align once daily, rest, fluids, tylenol, mucinex 1-2 po bid .  Pt voiced her understanding and is aware of meds sent to the pharmacy.  Nothing further is needed.

## 2012-04-14 NOTE — Telephone Encounter (Signed)
Called spoke with patient who c/o cough occasionally producing clear/green mucus, some tightness x2-3days.  Denies wheezing, dyspnea, f/c/s.  Requesting rx.  Last ov 10.7.13, upcoming 4.7.14 [facility] Allergies  Allergen Reactions  . Clarithromycin     REACTION: abd pain and nausea  . Coumadin (Warfarin Sodium) Other (See Comments)    hallucinations   . Iohexol      Desc: sob with ivp approx 20 yrs ago, pt was premedicated with 13hr prep with no problems kdean, Onset Date: [id number]   . Penicillins     REACTION: abd pain and nausea  . Prednisone     REACTION: swelling and nausea  . Statins     REACTION: abd pain and dark stools  . Sulfonamide Derivatives     REACTION: abd pain and nausea   Dr Kriste Basque please advise, thanks.

## 2012-04-14 NOTE — Telephone Encounter (Signed)
Pt called back again. She asks that she be called back @ [phone] as her home phone isn't working now. [name]

## 2012-04-14 NOTE — Telephone Encounter (Signed)
Spoke with pt, she c/o chest congestion. No fever. She will try robitussin dm and if the congestion does not get better in the next day or so she will call dr nadel.

## 2012-04-14 NOTE — Telephone Encounter (Signed)
Pt calling again in ref to previous msg can be reached at [phone].[name]

## 2012-04-14 NOTE — Telephone Encounter (Signed)
New problem:   C/O cold, H/O pacemaker . Please advise on what medication she can take.  PCP was not contacted.

## 2012-04-21 ENCOUNTER — Encounter: Payer: Medicare Other | Admitting: Internal Medicine

## 2012-05-23 ENCOUNTER — Encounter: Payer: Self-pay | Admitting: Internal Medicine

## 2012-05-23 ENCOUNTER — Ambulatory Visit (INDEPENDENT_AMBULATORY_CARE_PROVIDER_SITE_OTHER): Payer: Medicare Other | Admitting: Internal Medicine

## 2012-05-23 VITALS — BP 143/57 | HR 84 | Wt 188.2 lb

## 2012-05-23 DIAGNOSIS — I2510 Atherosclerotic heart disease of native coronary artery without angina pectoris: Secondary | ICD-10-CM | POA: Diagnosis not present

## 2012-05-23 DIAGNOSIS — I10 Essential (primary) hypertension: Secondary | ICD-10-CM | POA: Diagnosis not present

## 2012-05-23 DIAGNOSIS — I442 Atrioventricular block, complete: Secondary | ICD-10-CM

## 2012-05-23 LAB — PACEMAKER DEVICE OBSERVATION
AL THRESHOLD: 0.25 V
BAMS-0001: 150 {beats}/min
RV LEAD IMPEDENCE PM: 675 Ohm
RV LEAD THRESHOLD: 0.625 V

## 2012-05-23 NOTE — Patient Instructions (Addendum)
Your physician wants you to follow-up in: Oct with Dr Johney Frame Bonita Quin will receive a reminder letter in the mail two months in advance. If you don't receive a letter, please call our office to schedule the follow-up appointment.

## 2012-05-23 NOTE — Progress Notes (Signed)
PCP: Michele Mcalpine, MD  [name] is a 79 y.o. female who presents today for routine electrophysiology followup.  Since having her pacemaker implanted, the patient reports doing very well.  Today, she denies symptoms of palpitations, chest pain, shortness of breath,  lower extremity edema, dizziness, presyncope, or syncope.  The patient is otherwise without complaint today.   Past Medical History  Diagnosis Date  . Unspecified hearing loss   . Unspecified chronic bronchitis   . HTN (hypertension)   . CAD (coronary artery disease)     s/p CABG  . S/P AVR (aortic valve replacement)     bioprosthetic   . Atrial fibrillation     not anticoagulation candidate due to falls  . Personal history of unspecified circulatory disease   . Carotid artery stenosis   . Hyperlipidemia   . Esophageal reflux   . Diverticulosis of colon (without mention of hemorrhage)   . Unspecified hemorrhoids without mention of complication   . Unspecified disorder resulting from impaired renal function   . Osteoarthrosis, unspecified whether generalized or localized, unspecified site   . Osteoporosis, unspecified   . Other persistent mental disorders due to conditions classified elsewhere   . Anxiety state, unspecified   . Anemia, unspecified   . Complete heart block     s/p St.Jude dual chamber PPM 01/12/12  . Thoracic aortic aneurysm     s/p repair   Past Surgical History  Procedure Laterality Date  . Total hip arthroplasty      left  . Vesicovaginal fistula closure w/ tah    . Intraocular lens insertion      bilateral  . Coronary artery bypass graft    . Aortic valve replacement    . Cataract extraction      bilateral  . Pacemaker insertion  01/12/12    SJM Accent DR RF implanted by Dr Johney Frame for CHB    Current Outpatient Prescriptions  Medication Sig Dispense Refill  . acetaminophen (TYLENOL) 325 MG tablet Take 650 mg by mouth every 6 (six) hours as needed.      Marland Kitchen amLODipine (NORVASC) 5 MG  tablet Take 5 mg by mouth daily.      Marland Kitchen aspirin 325 MG tablet Take 325 mg by mouth daily.      . calcium carbonate (TUMS - DOSED IN MG ELEMENTAL CALCIUM) 500 MG chewable tablet Chew 2 tablets by mouth daily as needed.       . citalopram (CELEXA) 20 MG tablet Take 20 mg by mouth daily.        . folic acid (FOLVITE) 1 MG tablet TAKE 1 TABLET EVERY DAY  30 tablet  11  . furosemide (LASIX) 40 MG tablet Take 80 mg by mouth daily.      . furosemide (LASIX) 40 MG tablet TAKE 2 TABLETS EVERY MORNING  60 tablet  4  . polyethylene glycol powder (MIRALAX) powder Take 17 g by mouth as needed.        . pravastatin (PRAVACHOL) 40 MG tablet Take 40 mg by mouth at bedtime.      . risperiDONE (RISPERDAL) 0.25 MG tablet Take 1 tablet by mouth at bedtime.      . senna-docusate (SENOKOT-S) 8.6-50 MG per tablet Take 2 tablets by mouth daily as needed.        No current facility-administered medications for this visit.    Physical Exam: Filed Vitals:   05/23/12 1424  BP: 143/57  Pulse: 84  Weight: 188 lb 3.2  oz (85.367 kg)    GEN- The patient is well appearing, alert and oriented x 3 today.   Head- normocephalic, atraumatic Eyes-  Sclera clear, conjunctiva pink Ears- hearing intact Oropharynx- clear Lungs- Clear to ausculation bilaterally, normal work of breathing Chest- pacemaker pocket is well healed Heart- Regular rate and rhythm, no murmurs, rubs or gallops, PMI not laterally displaced GI- soft, NT, ND, + BS Extremities- no clubbing, cyanosis, or edema  Pacemaker interrogation- reviewed in detail today,  See PACEART report  Assessment and Plan:  1. Complete heart block Normal pacemaker function AV conduction has improved.  V paced 83%.  Will turn VIP on to minimize V pacing and follow closely. See Pace Art report No changes today  2. HTN Stable No change required today  3. CAD No ischemic symptoms No changes today

## 2012-06-04 ENCOUNTER — Other Ambulatory Visit: Payer: Self-pay | Admitting: Pulmonary Disease

## 2012-06-09 ENCOUNTER — Other Ambulatory Visit: Payer: Self-pay | Admitting: Pulmonary Disease

## 2012-06-21 ENCOUNTER — Ambulatory Visit (INDEPENDENT_AMBULATORY_CARE_PROVIDER_SITE_OTHER): Payer: Medicare Other | Admitting: Cardiology

## 2012-06-21 ENCOUNTER — Encounter: Payer: Self-pay | Admitting: Cardiology

## 2012-06-21 VITALS — BP 125/80 | HR 86 | Ht 63.0 in | Wt 187.6 lb

## 2012-06-21 DIAGNOSIS — Z950 Presence of cardiac pacemaker: Secondary | ICD-10-CM

## 2012-06-21 DIAGNOSIS — Z951 Presence of aortocoronary bypass graft: Secondary | ICD-10-CM | POA: Diagnosis not present

## 2012-06-21 DIAGNOSIS — I10 Essential (primary) hypertension: Secondary | ICD-10-CM | POA: Diagnosis not present

## 2012-06-21 DIAGNOSIS — Z954 Presence of other heart-valve replacement: Secondary | ICD-10-CM | POA: Diagnosis not present

## 2012-06-21 DIAGNOSIS — I4891 Unspecified atrial fibrillation: Secondary | ICD-10-CM | POA: Diagnosis not present

## 2012-06-21 DIAGNOSIS — E785 Hyperlipidemia, unspecified: Secondary | ICD-10-CM

## 2012-06-21 DIAGNOSIS — I480 Paroxysmal atrial fibrillation: Secondary | ICD-10-CM

## 2012-06-21 DIAGNOSIS — Z952 Presence of prosthetic heart valve: Secondary | ICD-10-CM

## 2012-06-21 NOTE — Assessment & Plan Note (Signed)
Blood pressure controlled. Continue present medications. 

## 2012-06-21 NOTE — Progress Notes (Signed)
HPI: [name] is a pleasant female who has a history of coronary disease status post bypassing graft, history of thoracic aortic aneurysm repair, as well as aortic valve replacement and paroxysmal atrial fibrillation. Last Myoview was performed on September 02, 2006. At that time she had normal perfusion with an ejection fraction of 76%. Carotid Dopplers performed in Sept 2011 revealed normal carotids. Note she has also had problems with dementia with psychotic features. Last echocardiogram in April of 2013 showed normal LV function, mild left ventricular hypertrophy and grade 1 diastolic dysfunction. There was a bioprosthetic aortic valve with a mean gradient of 18 mm mercury. There was mild left atrial enlargement and mild mitral regurgitation. The patient was found to have symptomatic high degree AV block in October of 2013 and had a pacemaker placed. Since then the patient denies any dyspnea on exertion, orthopnea, PND, pedal edema, palpitations, syncope or chest pain.    Current Outpatient Prescriptions  Medication Sig Dispense Refill  . acetaminophen (TYLENOL) 325 MG tablet Take 650 mg by mouth every 6 (six) hours as needed.      Marland Kitchen amLODipine (NORVASC) 5 MG tablet Take 5 mg by mouth daily.      Marland Kitchen aspirin 325 MG tablet Take 325 mg by mouth daily.      . calcium carbonate (TUMS - DOSED IN MG ELEMENTAL CALCIUM) 500 MG chewable tablet Chew 2 tablets by mouth daily as needed.       . citalopram (CELEXA) 20 MG tablet Take 20 mg by mouth daily.        . folic acid (FOLVITE) 1 MG tablet TAKE 1 TABLET EVERY DAY  30 tablet  11  . furosemide (LASIX) 40 MG tablet Take 80 mg by mouth daily.      . polyethylene glycol powder (MIRALAX) powder Take 17 g by mouth as needed.        . pravastatin (PRAVACHOL) 40 MG tablet Take 40 mg by mouth at bedtime.      . risperiDONE (RISPERDAL) 0.25 MG tablet Take 1 tablet by mouth at bedtime.      . senna-docusate (SENOKOT-S) 8.6-50 MG per tablet Take 2 tablets by mouth  daily as needed.       . betamethasone dipropionate 0.05 % lotion APPLY TOPICALLY 2 (TWO) TIMES DAILY.  60 mL  3   No current facility-administered medications for this visit.     Past Medical History  Diagnosis Date  . Unspecified hearing loss   . Unspecified chronic bronchitis   . HTN (hypertension)   . CAD (coronary artery disease)     s/p CABG  . S/P AVR (aortic valve replacement)     bioprosthetic   . Atrial fibrillation     not anticoagulation candidate due to falls  . Personal history of unspecified circulatory disease   . Carotid artery stenosis   . Hyperlipidemia   . Esophageal reflux   . Diverticulosis of colon (without mention of hemorrhage)   . Unspecified hemorrhoids without mention of complication   . Unspecified disorder resulting from impaired renal function   . Osteoarthrosis, unspecified whether generalized or localized, unspecified site   . Osteoporosis, unspecified   . Other persistent mental disorders due to conditions classified elsewhere   . Anxiety state, unspecified   . Anemia, unspecified   . Complete heart block     s/p St.Jude dual chamber PPM 01/12/12  . Thoracic aortic aneurysm     s/p repair    Past Surgical History  Procedure Laterality Date  . Total hip arthroplasty      left  . Vesicovaginal fistula closure w/ tah    . Intraocular lens insertion      bilateral  . Coronary artery bypass graft    . Aortic valve replacement    . Cataract extraction      bilateral  . Pacemaker insertion  01/12/12    SJM Accent DR RF implanted by Dr Johney Frame for CHB    History   Social History  . Marital Status: Married    Spouse Name: [name]    Number of Children: N/A  . Years of Education: N/A   Occupational History  . retired    Social History Main Topics  . Smoking status: Never Smoker   . Smokeless tobacco: Never Used  . Alcohol Use: No  . Drug Use: No  . Sexually Active: Not on file   Other Topics Concern  . Not on file   Social  History Narrative   Lives with husband. Mother had kidney problems.  Father had coronary artery  disease.  Both died when the patient was young.  She does not know how old  they were.  She has one brother who died at age 64 of a CVA, although, he  had coronary artery disease and diabetes.  Another brother died at age 20 of  a stroke.  He also had coronary artery disease.  One sister is living, she  has coronary artery disease and diabetes and one sister has anginal symptoms. Her sister has a pacemaker (history palpitations).           ROS: no fevers or chills, productive cough, hemoptysis, dysphasia, odynophagia, melena, hematochezia, dysuria, hematuria, rash, seizure activity, orthopnea, PND, pedal edema, claudication. Remaining systems are negative.  Physical Exam: Well-developed well-nourished in no acute distress.  Skin is warm and dry.  HEENT is normal.  Neck is supple.  Chest is clear to auscultation with normal expansion.  Cardiovascular exam is regular rate and rhythm. 2/6 systolic murmur left sternal border. No diastolic murmur. Abdominal exam nontender or distended. No masses palpated. Extremities show no edema. neuro grossly intact  ECG sinus rhythm at a rate of 86. Left ventricular hypertrophy with repolarization abnormality. Left axis deviation.

## 2012-06-21 NOTE — Assessment & Plan Note (Signed)
Management per electrophysiology. 

## 2012-06-21 NOTE — Assessment & Plan Note (Signed)
Continue SBE prophylaxis. Repeat echocardiogram. 

## 2012-06-21 NOTE — Patient Instructions (Addendum)

## 2012-06-21 NOTE — Assessment & Plan Note (Signed)
Continue statin. Lipids and liver monitored by primary care. 

## 2012-06-21 NOTE — Assessment & Plan Note (Signed)
Continue aspirin and statin. 

## 2012-06-21 NOTE — Assessment & Plan Note (Signed)
Continue aspirin. Not a Coumadin candidate.

## 2012-06-27 ENCOUNTER — Ambulatory Visit (INDEPENDENT_AMBULATORY_CARE_PROVIDER_SITE_OTHER): Payer: Medicare Other | Admitting: Pulmonary Disease

## 2012-06-27 ENCOUNTER — Encounter: Payer: Self-pay | Admitting: Pulmonary Disease

## 2012-06-27 VITALS — BP 130/82 | HR 82 | Temp 97.3°F | Ht 63.0 in | Wt 186.0 lb

## 2012-06-27 DIAGNOSIS — N259 Disorder resulting from impaired renal tubular function, unspecified: Secondary | ICD-10-CM

## 2012-06-27 DIAGNOSIS — M1990 Unspecified osteoarthritis, unspecified site: Secondary | ICD-10-CM

## 2012-06-27 DIAGNOSIS — K219 Gastro-esophageal reflux disease without esophagitis: Secondary | ICD-10-CM

## 2012-06-27 DIAGNOSIS — I4891 Unspecified atrial fibrillation: Secondary | ICD-10-CM

## 2012-06-27 DIAGNOSIS — M545 Low back pain: Secondary | ICD-10-CM

## 2012-06-27 DIAGNOSIS — Z954 Presence of other heart-valve replacement: Secondary | ICD-10-CM

## 2012-06-27 DIAGNOSIS — I6529 Occlusion and stenosis of unspecified carotid artery: Secondary | ICD-10-CM | POA: Diagnosis not present

## 2012-06-27 DIAGNOSIS — Z950 Presence of cardiac pacemaker: Secondary | ICD-10-CM

## 2012-06-27 DIAGNOSIS — I442 Atrioventricular block, complete: Secondary | ICD-10-CM

## 2012-06-27 DIAGNOSIS — I480 Paroxysmal atrial fibrillation: Secondary | ICD-10-CM

## 2012-06-27 DIAGNOSIS — I10 Essential (primary) hypertension: Secondary | ICD-10-CM | POA: Diagnosis not present

## 2012-06-27 DIAGNOSIS — M810 Age-related osteoporosis without current pathological fracture: Secondary | ICD-10-CM

## 2012-06-27 DIAGNOSIS — F068 Other specified mental disorders due to known physiological condition: Secondary | ICD-10-CM

## 2012-06-27 DIAGNOSIS — D649 Anemia, unspecified: Secondary | ICD-10-CM

## 2012-06-27 DIAGNOSIS — E785 Hyperlipidemia, unspecified: Secondary | ICD-10-CM

## 2012-06-27 DIAGNOSIS — Z951 Presence of aortocoronary bypass graft: Secondary | ICD-10-CM | POA: Diagnosis not present

## 2012-06-27 DIAGNOSIS — K5730 Diverticulosis of large intestine without perforation or abscess without bleeding: Secondary | ICD-10-CM

## 2012-06-27 NOTE — Progress Notes (Signed)
Subjective:    Patient ID: [name], female    DOB: 25-Mar-1923, 79 y.o.   MRN: [id number]  HPI 79 y/o WF w/ mult med problems here for a follow up visit...   ~  May 26, 2010:  15mo ROV c/o right hip pain & she has an upcoming appt w/ DrAlusio;  s/p left THR in 2004;  she doesn't want anything for pain- just a handicap application for her car- OK...    >She saw DrCrenshaw 9/11 for f/u HBP, CAD s/p CABG & AVR w/ thor aneurysm repair, & hx PAF (she follows SBE prophylaxis protocols, but not a coumadin candidate due to falls)> stable, no changes made;  2DEcho 9/11 showed mild LVH, normal wall motion w/ EF=60-65%, AoV prosthesis looks good, RV mild dil & RV function sl reduced;  CDopplers 9/11 showed norm looking carotids w/ good flow but tort distal ICAs...    >BP remains well controlled at 124/68 on her 3 meds;  she denies CP, palpit, dizziness, SOB, ch in edema, etc;  Chol looks good on the Prav40;  mild renal insuffic w/ Creat 1.5;  she still sees DrPlovsky every 53mo...  ~  November 26, 2010:  15mo ROV & she reports doing well overall; we reviewed her meds & she requests Rx for Betamethasone cream for her scalp- ok;  Labs essentially wnl- BUN=35, Creat=1.4, Hg=12.9    >BP controlled on Metop, Amlodip, Lasix w/ BP= 122/70; she denies CP, palpit, ch in dyspnea, edema, etc; known CAD w/ CABG 2003 along w/ tissue AVR & Mabeline Caras AA repair; Hx PAF but holding NSR...    >She saw DrGioffre 4/12 for right hip pain gradually worse, hx prev leftTHR~2004, XRays showed severe degen arthritis & he rec surg but she is reluctant; alternatively he rec Tylenol & use a cane...    >Hx stroke w/ tortuous vessels on CDoppler analysis by [facility]; Hx Dementia & prev Delirium w/ psychotic features; now followed by DrPlovsky & back on Celexa (she was hearing "singing" & improved back on Rx).  ~  May 27, 2011:  15mo ROV & she states she is doing well & has no new complaints or concerns> but weight is up 10# to 192# today and she  notes some right hip pain for which she takes Tylenol; she ambulates w/ walker but does ok w/ her ADLs she says...    HBP> on Metop50Bid, Amlodip5, Lasix40- 2tabs daily; BP= 120/66 & she denies CP, palpit, syncope, SOB, edema...    CAD> s/p CABG 2003, followed by DrCrenshaw & last seen 9/11 (see below); we will refer for her overdue yearly f/u visit...    Valv heart dis> s/p tissue AVR & ThorAA repair in 2003; she remains too sedentary & rec to incr her exercise as able; Hx PAF in past but holding NSR x years now.    Carotid art dis & Hx TIA> on ASA 325mg /d; prev eval & rx by DrSethi; she was on Coumadin in past but this was stopped 2009 in [place]...    CHOL> on Prav40; FLP shows TChol 188, TG 269, HDL 47, LDL 96; needs better low fat diet...    Ortho> c/o right hip pain w/ known severe degen arthritis- she has declined surg (s/p prev left THR- DrAlusio 2004); uses Tylenol as needed; ambulates w/ cane/ walker...    Dementia/ Psyche> on Risperdone0.25Qhs and Celexa20mg /d; followed by [facility] for psychiatry... LABS 3/13:  FLP- at goals x TG=269;  Chems- ok w/ Creat=1.4;  CBC- wnl;  TSH=2.97;  BNP=109  ~  December 28, 2011:  11mo ROV & November says she's feeling well, no new complaints;  BP stable 7 controlled on Metop50Bid, Amlod5, Lasix40-2/d> BP= 118/72 & remains asymptomatic;  DrCrenshaw follows for CAD- last seen 4/13> s/p CABG & ThorAA repair w/ AVR, Hx PAF(holding NSR on ASA), etc; she knows about SBE prophy etc...  Chol has been ok on Prav40;  She does note some right hip area pain- on Tylenol alone & uses walker, rec f/u by Ortho for further eval...  She continues to f/u w/ DrPlovsky for Hx dementia w/ psychotic features- on Celexa & Risperdal...    We reviewed prob list, meds, xrays and labs> see below for updates >> OK Flu vaccine today... LABS 10/13 showed BMet- stable w/ BUN=26, Creat=1.4   ~  June 27, 2012:  20mo ROV & since Eleah was last seen she was hosp 10/21 - 01/13/12 by Cards for  symptomatic CHB w/ Pacer insertion; known mult cardiac issues w/ HBP, CAD s/p CABG, AFib & not a coumadin cand due to falls, prev thor Ao aneurysm repair & AVR w/ bioprosthetic valve;  She had f/u ROV w/ DrAllred 3/14- doing well 7 pacer functioning normally, no changes in programming;  She had f/u appt w/ DrCrenshaw 4/14- doing well since pacer, continue SBE prophylaxis & other meds- no change; f/u 2DEcho 4/14 showed mod LVH w/ normal wall motion & EF=55-65%, bioprosthetic AoV w/ mild stenosis (mean grad=20, peak grad=33)...    She remains stable on Amlod5 & Lasix40-2/d, along w/ [id number]; BP=130/82 & she denies CP, palpit, ch in SOB/DOE, edema, etc...    FLP on Prav40 shows TChol 189, TG 239, HDL 38, LDL 108 & she needs better low fat diet & wt reduction- given diet counseling...    She remains on Miralax, Senakot & reports good BMs etc...     She remains on Celexa25 & Risperdal 0.25mg  Qhs & reports that she rests well etc... We reviewed prob list, meds, xrays and labs> see below for updates >>  LABS 4/14:  FLP- not at goals on Prav40 & we reviewed diet/ exercise;  Chems- ok w/ Cr=1.3;  CBC- wnl;  TSH=2.28;  VitD=36 & rec to take 1000u daily supplement.           Problem List:  HEARING LOSS - ENT eval 9/10 by DrTeoh...  BRONCHITIS, RECURRENT (ICD-491.9) >> see above... Hx of PNEUMONIA (ICD-486)  HYPERTENSION (ICD-401.9) - on ASA 325mg /d, NORVASC 5mg /d, & LASIX 40mg - 2tabsQam... BP=130/82 & doing well... denies HA, visual changes, CP, palipit, dizziness, syncope, change in dyspnea, or incr edema...  CORONARY ARTERY DISEASE (ICD-414.00) - S/P CABG 2003 w/ vein graft to PDA... no CP, palpit, or change in dyspnea... she is still pretty sedentary... followed by Aletta Edouard for Cards... ~   last NuclearStressTest 6/08 showed RBBB, no ischemia, EF=76%... ~  labs 9/09 showed BNP= 353 on Lasix 80mg /d. ~  labs 1/10 showed BNP= 204 ~  saw DrCrenshaw 9/10 w/ 2DEcho- mild LVH, EF=60%, wall motion norm,  tissue AoV prosthesis- OK. ~  f/u DrCrenshaw 9/11 & he felt she was stable & ordered repeat CDoppler & 2DEcho> reviewed... ~  f/u DrCrenshaw 4/13 reviewed & stable... ~  f/u DrCrenshaw 4/14 reviewed & stable> 2DEcho showed mod LVH w/ normal wall motion & EF=55-65%, bioprosthetic AoV w/ mild stenosis (mean grad=20, peak grad=33).   Hx of VALVULAR HEART DISEASE (ICD-424.90) - S/P tissue AVR & Thor AA repair 2003. ~  she knows about the need for SBE prophylaxis. ~  2DEcho 3/09 showed a bioprosthetic valve w/ 21mm gradient, & norm LVF w/ EF= 55-60%. ~  2DEcho 9/10- mild LVH, EF=60%, wall motion norm, tissue AoV prosthesis- working well... ~  2DEcho 9/11 showed mild LVH, normal wall motion w/ EF=60-65%, AoV prosthesis looks good, RV mild dil & RV function sl reduced. ~  2DEcho 4/13 showed mild LVH, norm LVF- no regional wall motion abn & EF=60-65%, redundant septum (borderline aneurysm) Gr1DD, mildMR. ~  2DEcho 4/14 showed mod LVH w/ normal wall motion & EF=55-65%, bioprosthetic AoV w/ mild stenosis (mean grad=20, peak grad=33).   Hx of ATRIAL FIBRILLATION (ICD-427.31) - PAF prev on coumadin & stopped 2/09... she is holding NSR... Hx COMPLETE HEART BLOCK 10/13 w/ PACER insertion by drAllred >> ~  f/u DrAllred 3/14 w/ normally functioning pacer & pt doing well...  CEREBROVASCULAR ACCIDENT, HX OF (ICD-V12.50) - Eval and Rx DrSethi... She remains on ASA 325mg /d. ~  CDopplers 10/08 w/ tort ICA's, no plaque, normal carotids. ~  CDopplers 4/10 at [facility] read by [facility] showed bilat 40-59% ICA stenoses believed to be from tortuosity of vessels. ~  CDopplers 9/11 showed norm looking carotids w/ good flow but tort distal ICAs...  HYPERLIPIDEMIA (ICD-272.4) - she stopped the Crestor 5mg  again> she will agree to try PRAV40 since it is cheaper. ~  FLP 1/08 showed TChol 128, TG 94, HDL 40, LDL 69... ~  FLP 1/10 on diet showed TChol 247, TG 200, HDL 40, LDL 166... rec> start Crestor5. ~  FLP 7/10 on Cres5  showed TChol 157, TG 202, HDL 42, LDL 85... rec> continue same. ~  11/10: pt reports that she stopped the Crestor ?why- ?mild symptoms, advised to restart Rx. ~  FLP 3/11 on diet alone showed TChol 258, TG 359, HDL 49, LDL 140... start PRAV40. ~  FLP 9/11 on Prav40 showed TChol 210, TG 364, HDL 41, LDL 103... needs better low fat diet. ~  It has been hard to get her in FASTING for blood work. ~  FLP 3/13 on Prav40 showed TChol 188, TG 269, HDL 47, LDL 96... Needs better low fat diet & wt reduction.  GERD (ICD-530.81) - on OMEPRAZOLE 20mg /d Prn & controls her symptoms.  DIVERTICULOSIS OF COLON (ICD-562.10) & HEMORRHOIDS (ICD-455.6) - uses Miralax Prn... ~  last colon 10/00 [facility] w/ divertics, hems, cecal AVM.  RENAL INSUFFICIENCY (ICD-588.9) & Hx of UTI (ICD-599.0) ~  labs 3/09 showed BUN= 20, Creat= 1.4 ~  labs 9/09 showed BUN= 17, Creat= 1.1 ~  labs 7/10 showed BUN= 21, Creat= 1.4 ~  labs 3/11 showed BUN= 17, Creat= 1.2 ~  labs 9/11 showed BUN= 22, Creat= 1.3 ~  Labs 3/12 showed BUN= 32, Creat= 1.5 ~  Labs 9/12 showed BUN= 35, Creat= 1.4 ~  Labs 3/13 showed BUN= 26, Creat= 1.4 ~  Labs 10/13 showed BUN= 26, Creat= 1.4  DEGENERATIVE JOINT DISEASE (ICD-715.90) - s/p left THR 7/04 by DrAlusio. ~  labs 7/10 showed Vit D level = 30... rec to start Vit D 1000 u daily OTC. ~  labs 3/11 showed Vit D level = 26... rec incr Vit D to 2000 u daily.  BACK PAIN, LUMBAR (ICD-724.2) - Hosp 4/10 after fall at home w/ L1 compression fx & retropulsed fragment in canal- eval by DrElsner w/ rec for conservative management w/ brace, rest, rehab...  DEMENTIA (ICD-294.8) - Prev hx DELIRIUM w/ Abn MRI showing atrophy, sm vessel dis, old infarct.Marland KitchenMarland Kitchen  then hosp 12/08 w/ aud hallucinations & psych rx from DrWilliford w/ Risperdal, Depakote, Ativan... 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 she is now stable on a complex psychotopic regimen per [facility], Psychiatry... see med list... ~  1/10:  now off Risperdal... on Depakote 250mg /d, Celexa 20mg - 1.5tabsQhs. ~  6/10:  now off Depakote, on Celexa alone & followed by DrPlovsky. ~  3/11:  She states that "my mind is back where it should be" & "I don't have dementia";  states she's driving now & I cautioned her and her husb about this. ~  9/12:  Followed by DrPlovsky & back on Celexa (she was hearing "singing" & improved back on Rx). ~  3/13 & 10/13:  Stable on Risperdal & Celexa from DrPlovsky...  ANXIETY (ICD-300.00)  ANEMIA (ICD-285.9) ~  labs 3/09 showed Hg= 11.7 ~  labs 9/09 showed Hg 12.2 ~  labs 1/10 showed Hg= 12.8 ~  labs 7/10 showed Hg= 13.0 ~  labs 3/11 showed Hg= 12.8 ~  Labs 3/12 showed Hg= 12.8 ~  Labs 9/12 showed Hg= 12.9 ~  Labs 3/13 showed Hg= 13.1   Past Surgical History  Procedure Laterality Date  . Total hip arthroplasty      left  . Vesicovaginal fistula closure w/ tah    . Intraocular lens insertion      bilateral  . Coronary artery bypass graft    . Aortic valve replacement    . Cataract extraction      bilateral  . Pacemaker insertion  01/12/12    SJM Accent DR RF implanted by Dr Johney Frame for CHB    Outpatient Encounter Prescriptions as of 06/27/2012  Medication Sig Dispense Refill  . acetaminophen (TYLENOL) 325 MG tablet Take 650 mg by mouth every 6 (six) hours as needed.      Marland Kitchen amLODipine (NORVASC) 5 MG tablet Take 5 mg by mouth daily.      Marland Kitchen aspirin 325 MG tablet Take 325 mg by mouth daily.      . betamethasone dipropionate 0.05 % lotion APPLY TOPICALLY 2 (TWO) TIMES DAILY.  60 mL  3  . calcium carbonate (TUMS - DOSED IN MG ELEMENTAL CALCIUM) 500 MG chewable tablet Chew 2 tablets by mouth daily as needed.       . citalopram (CELEXA) 20  MG tablet Take 20 mg by mouth daily.        . folic acid (FOLVITE) 1 MG tablet TAKE 1 TABLET EVERY DAY  30 tablet  11  . furosemide (LASIX) 40 MG tablet Take 80 mg by mouth daily.      . polyethylene glycol powder (MIRALAX) powder Take 17 g by mouth as needed.        . pravastatin (PRAVACHOL) 40 MG tablet Take 40 mg by mouth at bedtime.      . risperiDONE (RISPERDAL) 0.25 MG tablet Take 1 tablet by mouth at bedtime.      . senna-docusate (SENOKOT-S) 8.6-50 MG per tablet Take 2 tablets by mouth daily as needed.        No facility-administered encounter medications on file as of 06/27/2012.    Allergies  Allergen Reactions  . Clarithromycin     REACTION: abd  pain and nausea  . Coumadin (Warfarin Sodium) Other (See Comments)    hallucinations   . Iohexol      Desc: sob with ivp approx 20 yrs ago, pt was premedicated with 13hr prep with no problems kdean, Onset Date: [id number]   . Penicillins     REACTION: abd pain and nausea  . Prednisone     REACTION: swelling and nausea  . Statins     REACTION: abd pain and dark stools  . Sulfonamide Derivatives     REACTION: abd pain and nausea    Current Medications, Allergies, Past Medical History, Past Surgical History, Family History, and Social History were reviewed in [employer] record.    Review of Systems        See HPI - all other systems neg except as noted...  The patient complains of decreased hearing, dyspnea on exertion, peripheral edema, incontinence, and difficulty walking.  The patient denies anorexia, fever, weight loss, weight gain, vision loss, hoarseness, chest pain, syncope, prolonged cough, headaches, hemoptysis, abdominal pain, melena, hematochezia, severe indigestion/heartburn, hematuria, muscle weakness, suspicious skin lesions, transient blindness, depression, unusual weight change, abnormal bleeding, enlarged lymph nodes, and angioedema.     Objective:   Physical Exam     WD, sl overweight, 79  y/o WF in NAD... flattened affect apparent, without change... GENERAL:  Alert & oriented x3... HEENT:  [place]/AT, EOM-wnl, PERRLA, EACs-clear, TMs-wnl, NOSE-clear, THROAT- sl dry, clear & wnl. NECK:  Supple w/ fairROM; no JVD; normal carotid impulses w/o bruits; no thyromegaly or nodules palpated; no lymphadenopathy. CHEST:  Clear to P & A; without wheezes/ rales/ or rhonchi heard... mild congested cough... HEART: s/p med sternotomy, regular,  gr1/6 SEM without rubs or gallops apprec... ABDOMEN:  Soft & nontender; normal bowel sounds; no organomegaly or masses detected. EXT: without deformities, s/p left THR, mod arthritic changes; no varicose veins/ +venous insuffic/ tr edema is noted... NEURO:  CN's intact;  Hx back brace, L1 compression, no focal neuro changes... DERM:  No lesions noted; no rash etc...  RADIOLOGY DATA:  Reviewed in the EPIC EMR & discussed w/ the patient...    >>CXR 4/10 showed prev surg w/ CABG/ AVR; no acute changes...  LABORATORY DATA:  Reviewed in the EPIC EMR & discussed w/ the patient...    >>LABS 3/13:  FLP- at goals x TG=269;  Chems- ok w/ Creat=1.4;  CBC- wnl;  TSH=2.97;  BNP=109   Assessment & Plan:    HBP>  BP controlled on BBlocker, CCB, Diuretic; continue same + low sodium diet etc...  Cardiac> CAD, s/p CABG, s/p AVR tissue valve & ThorAA repair>  Followed by Aletta Edouard for Cards... Hx PAF>  Holding NSR until her CHB & she is on ASA325 daily... S/P PACER 10/13 for CHB> followed by DrAllred 7 doing satis...  Hyperlipidemia>  On Prav40 + low chol, low fat diet & FLP shows good chol parameters but elev TG- needs better low fat diet.  GI> GERD, Divertics, Hems>  On Omep, Miralax, etc as needed...  Renal Insuffic>  Stable w/ Creat in the 1.4-1.5 range...  DJD/ s/p leftTHR>  Followed by DrGioffre, DrAlusio et al; she needs right THR as well but declines surg & treating w/ support, cane, Tylenol...  LBP>  Hx fall w/ L1 fx & eval by DrElsner who rec  conservative management...  DEMENTIA>  She has hx delirium w/ hallucinations & psyche rx; now followed by DrPlovsky on Celexa...  Other medical problems as noted.Marland KitchenMarland Kitchen  Patient's Medications  New Prescriptions   No medications on file  Previous Medications   ACETAMINOPHEN (TYLENOL) 325 MG TABLET    Take 650 mg by mouth every 6 (six) hours as needed.   AMLODIPINE (NORVASC) 5 MG TABLET    Take 5 mg by mouth daily.   ASPIRIN 325 MG TABLET    Take 325 mg by mouth daily.   BETAMETHASONE DIPROPIONATE 0.05 % LOTION    APPLY TOPICALLY 2 (TWO) TIMES DAILY.   CALCIUM CARBONATE (TUMS - DOSED IN MG ELEMENTAL CALCIUM) 500 MG CHEWABLE TABLET    Chew 2 tablets by mouth daily as needed.    CHOLECALCIFEROL (VITAMIN D) 1000 UNITS TABLET    Take 1,000 Units by mouth daily.   CITALOPRAM (CELEXA) 20 MG TABLET    Take 20 mg by mouth daily.     FUROSEMIDE (LASIX) 40 MG TABLET    Take 80 mg by mouth daily.   POLYETHYLENE GLYCOL POWDER (MIRALAX) POWDER    Take 17 g by mouth as needed.     PRAVASTATIN (PRAVACHOL) 40 MG TABLET    Take 40 mg by mouth at bedtime.   RISPERIDONE (RISPERDAL) 0.25 MG TABLET    Take 1 tablet by mouth at bedtime.   SENNA-DOCUSATE (SENOKOT-S) 8.6-50 MG PER TABLET    Take 2 tablets by mouth daily as needed.   Modified Medications   Modified Medication Previous Medication   AMLODIPINE (NORVASC) 5 MG TABLET amLODipine (NORVASC) 5 MG tablet      TAKE 1 TABLET (5 MG TOTAL) BY MOUTH DAILY.    TAKE 1 TABLET (5 MG TOTAL) BY MOUTH DAILY.   FOLIC ACID (FOLVITE) 1 MG TABLET folic acid (FOLVITE) 1 MG tablet      TAKE 1 TABLET EVERY DAY    TAKE 1 TABLET EVERY DAY   FUROSEMIDE (LASIX) 40 MG TABLET furosemide (LASIX) 40 MG tablet      TAKE 2 TABLETS EVERY MORNING    TAKE 2 TABLETS EVERY MORNING  Discontinued Medications   No medications on file

## 2012-06-27 NOTE — Patient Instructions (Addendum)
Today we updated your med list in our EPIC system...    Continue your current medications the same...  Please return to our lab one morning this week for your FASTING blood work...    We will contact you w/ the results when available...   Do the best you can w/ your low carb low fat diet & wt reduction program...  Call for any questions...  Let's plan a follow up visit in 7mo, sooner if needed for problems.Marland KitchenMarland Kitchen

## 2012-06-28 ENCOUNTER — Ambulatory Visit (HOSPITAL_COMMUNITY): Payer: Medicare Other | Attending: Cardiology | Admitting: Radiology

## 2012-06-28 DIAGNOSIS — I359 Nonrheumatic aortic valve disorder, unspecified: Secondary | ICD-10-CM

## 2012-06-28 DIAGNOSIS — I4891 Unspecified atrial fibrillation: Secondary | ICD-10-CM | POA: Diagnosis not present

## 2012-06-28 DIAGNOSIS — Z952 Presence of prosthetic heart valve: Secondary | ICD-10-CM | POA: Diagnosis not present

## 2012-06-28 NOTE — Progress Notes (Signed)
Echocardiogram performed.  

## 2012-06-29 ENCOUNTER — Other Ambulatory Visit (INDEPENDENT_AMBULATORY_CARE_PROVIDER_SITE_OTHER): Payer: Medicare Other

## 2012-06-29 DIAGNOSIS — I4891 Unspecified atrial fibrillation: Secondary | ICD-10-CM | POA: Diagnosis not present

## 2012-06-29 DIAGNOSIS — I10 Essential (primary) hypertension: Secondary | ICD-10-CM | POA: Diagnosis not present

## 2012-06-29 DIAGNOSIS — E785 Hyperlipidemia, unspecified: Secondary | ICD-10-CM

## 2012-06-29 DIAGNOSIS — K5730 Diverticulosis of large intestine without perforation or abscess without bleeding: Secondary | ICD-10-CM | POA: Diagnosis not present

## 2012-06-29 DIAGNOSIS — I480 Paroxysmal atrial fibrillation: Secondary | ICD-10-CM

## 2012-06-29 DIAGNOSIS — M810 Age-related osteoporosis without current pathological fracture: Secondary | ICD-10-CM | POA: Diagnosis not present

## 2012-06-29 LAB — CBC WITH DIFFERENTIAL/PLATELET
Basophils Absolute: 0 10*3/uL (ref 0.0–0.1)
Eosinophils Absolute: 0.1 10*3/uL (ref 0.0–0.7)
Lymphs Abs: 1 10*3/uL (ref 0.7–4.0)
MCHC: 33 g/dL (ref 30.0–36.0)
MCV: 89.8 fl (ref 78.0–100.0)
Monocytes Absolute: 0.4 10*3/uL (ref 0.1–1.0)
Neutrophils Relative %: 64.8 % (ref 43.0–77.0)
Platelets: 248 10*3/uL (ref 150.0–400.0)
RDW: 15.5 % — ABNORMAL HIGH (ref 11.5–14.6)
WBC: 4.6 10*3/uL (ref 4.5–10.5)

## 2012-06-29 LAB — BASIC METABOLIC PANEL
BUN: 20 mg/dL (ref 6–23)
Chloride: 103 mEq/L (ref 96–112)
Potassium: 4.3 mEq/L (ref 3.5–5.1)
Sodium: 139 mEq/L (ref 135–145)

## 2012-06-29 LAB — HEPATIC FUNCTION PANEL
ALT: 12 U/L (ref 0–35)
AST: 17 U/L (ref 0–37)
Alkaline Phosphatase: 99 U/L (ref 39–117)
Bilirubin, Direct: 0.1 mg/dL (ref 0.0–0.3)
Total Bilirubin: 0.6 mg/dL (ref 0.3–1.2)

## 2012-06-29 LAB — TSH: TSH: 2.28 u[IU]/mL (ref 0.35–5.50)

## 2012-06-29 LAB — LIPID PANEL: VLDL: 47.8 mg/dL — ABNORMAL HIGH (ref 0.0–40.0)

## 2012-06-30 LAB — VITAMIN D 25 HYDROXY (VIT D DEFICIENCY, FRACTURES): Vit D, 25-Hydroxy: 36 ng/mL (ref 30–89)

## 2012-07-04 ENCOUNTER — Other Ambulatory Visit: Payer: Self-pay | Admitting: Pulmonary Disease

## 2012-08-03 ENCOUNTER — Other Ambulatory Visit: Payer: Self-pay | Admitting: Pulmonary Disease

## 2012-10-19 DIAGNOSIS — F0390 Unspecified dementia without behavioral disturbance: Secondary | ICD-10-CM | POA: Diagnosis not present

## 2012-11-17 ENCOUNTER — Ambulatory Visit (INDEPENDENT_AMBULATORY_CARE_PROVIDER_SITE_OTHER): Payer: Medicare Other | Admitting: Internal Medicine

## 2012-11-17 ENCOUNTER — Encounter: Payer: Self-pay | Admitting: Internal Medicine

## 2012-11-17 VITALS — BP 114/68 | HR 90 | Ht 63.0 in | Wt 187.0 lb

## 2012-11-17 DIAGNOSIS — I441 Atrioventricular block, second degree: Secondary | ICD-10-CM | POA: Diagnosis not present

## 2012-11-17 DIAGNOSIS — I480 Paroxysmal atrial fibrillation: Secondary | ICD-10-CM

## 2012-11-17 DIAGNOSIS — I4891 Unspecified atrial fibrillation: Secondary | ICD-10-CM

## 2012-11-17 DIAGNOSIS — Z950 Presence of cardiac pacemaker: Secondary | ICD-10-CM | POA: Diagnosis not present

## 2012-11-17 LAB — PACEMAKER DEVICE OBSERVATION
AL AMPLITUDE: 5 mv
AL THRESHOLD: 0.375 V
BAMS-0001: 150 {beats}/min
BAMS-0003: 70 {beats}/min
RV LEAD AMPLITUDE: 4.8 mv

## 2012-11-17 NOTE — Patient Instructions (Addendum)
Your physician wants you to follow-up in: 12 months with [name] will receive a reminder letter in the mail two months in advance. If you don't receive a letter, please call our office to schedule the follow-up appointment.   Remote monitoring is used to monitor your Pacemaker or ICD from home. This monitoring reduces the number of office visits required to check your device to one time per year. It allows [place] to keep an eye on the functioning of your device to ensure it is working properly. You are scheduled for a device check from home on 02/20/13 You may send your transmission at any time that day. If you have a wireless device, the transmission will be sent automatically. After your physician reviews your transmission, you will receive a postcard with your next transmission date.

## 2012-11-17 NOTE — Progress Notes (Signed)
PCP: Michele Mcalpine, MD  [name] is a 79 y.o. female who presents today for routine electrophysiology followup.  Since having her pacemaker implanted, the patient reports doing very well.  Today, she denies symptoms of palpitations, chest pain, shortness of breath,  lower extremity edema, dizziness, presyncope, or syncope.  The patient is otherwise without complaint today.   Past Medical History  Diagnosis Date  . Unspecified hearing loss   . Unspecified chronic bronchitis   . HTN (hypertension)   . CAD (coronary artery disease)     s/p CABG  . S/P AVR (aortic valve replacement)     bioprosthetic   . Atrial fibrillation     not anticoagulation candidate due to falls  . Personal history of unspecified circulatory disease   . Carotid artery stenosis   . Hyperlipidemia   . Esophageal reflux   . Diverticulosis of colon (without mention of hemorrhage)   . Unspecified hemorrhoids without mention of complication   . Unspecified disorder resulting from impaired renal function   . Osteoarthrosis, unspecified whether generalized or localized, unspecified site   . Osteoporosis, unspecified   . Other persistent mental disorders due to conditions classified elsewhere   . Anxiety state, unspecified   . Anemia, unspecified   . Complete heart block     s/p St.Jude dual chamber PPM 01/12/12  . Thoracic aortic aneurysm     s/p repair   Past Surgical History  Procedure Laterality Date  . Total hip arthroplasty      left  . Vesicovaginal fistula closure w/ tah    . Intraocular lens insertion      bilateral  . Coronary artery bypass graft    . Aortic valve replacement    . Cataract extraction      bilateral  . Pacemaker insertion  01/12/12    SJM Accent DR RF implanted by Dr Johney Frame for CHB    Current Outpatient Prescriptions  Medication Sig Dispense Refill  . acetaminophen (TYLENOL) 325 MG tablet Take 650 mg by mouth every 6 (six) hours as needed.      Marland Kitchen amLODipine (NORVASC) 5 MG  tablet Take 5 mg by mouth daily.      Marland Kitchen aspirin 325 MG tablet Take 325 mg by mouth daily.      . betamethasone dipropionate 0.05 % lotion APPLY TOPICALLY 2 (TWO) TIMES DAILY.  60 mL  3  . calcium carbonate (TUMS - DOSED IN MG ELEMENTAL CALCIUM) 500 MG chewable tablet Chew 2 tablets by mouth daily as needed.       . cholecalciferol (VITAMIN D) 1000 UNITS tablet Take 1,000 Units by mouth daily.      . citalopram (CELEXA) 20 MG tablet Take 20 mg by mouth daily.        . folic acid (FOLVITE) 1 MG tablet TAKE 1 TABLET EVERY DAY  30 tablet  11  . furosemide (LASIX) 40 MG tablet TAKE 2 TABLETS EVERY MORNING  60 tablet  4  . polyethylene glycol powder (MIRALAX) powder Take 17 g by mouth as needed.        . pravastatin (PRAVACHOL) 40 MG tablet Take 40 mg by mouth at bedtime.      . risperiDONE (RISPERDAL) 0.25 MG tablet Take 1 tablet by mouth at bedtime.      . senna-docusate (SENOKOT-S) 8.6-50 MG per tablet Take 2 tablets by mouth daily as needed.        No current facility-administered medications for this visit.  Physical Exam: Filed Vitals:   11/17/12 1340  BP: 114/68  Pulse: 90  Height: 5\' 3"  (1.6 m)  Weight: 187 lb (84.823 kg)    GEN- The patient is well appearing, alert and oriented x 3 today.   Head- normocephalic, atraumatic Eyes-  Sclera clear, conjunctiva pink Ears- hearing intact Oropharynx- clear Lungs- Clear to ausculation bilaterally, normal work of breathing Chest- pacemaker pocket is well healed Heart- Regular rate and rhythm, no murmurs, rubs or gallops, PMI not laterally displaced GI- soft, NT, ND, + BS Extremities- no clubbing, cyanosis, or edema  Pacemaker interrogation- reviewed in detail today,  See PACEART report  Assessment and Plan:  1. Mobitz II second degree AV block Normal pacemaker function See Pace Art report  2. HTN Stable No change required today  3. CAD No ischemic symptoms No changes today  4. Afib Not presently anticoagulated due to  fall concerns.  Given that she is tolerating ASA, I would recommend consideration for Eliquis which has been shown in the AVEROES study to have bleeding risks comparable to aspirin with significant reduction in stroke risks.  I will defer this decision to Dr Jens Som.  Merlin Return to the device clinic in 1 year

## 2012-11-23 ENCOUNTER — Telehealth: Payer: Self-pay | Admitting: Internal Medicine

## 2012-11-23 NOTE — Telephone Encounter (Signed)
Spoke with patient, she had an episode last night when she got up to go to bathroom.  When she got back to the bed the ceiling was spinning.  It lasted a few minutes and then went away.  She feels good today.  I have advised her if this happens again to call Dr Kriste Basque as it sounds like vertigo.  We can also check her device and see if she was having any afib.  With it only lasting a few minuets it sounds more like vertigo.  She will let me know if it happens again and call her PCP

## 2012-11-23 NOTE — Telephone Encounter (Signed)
New Problem  Pt states that at the time of her recent appt she didnt have any dizziness// however this morning she had alot of dizziness and was "[employer]" which last about 15-20 min. Wants to know what she should do about it.

## 2012-11-29 ENCOUNTER — Other Ambulatory Visit: Payer: Self-pay | Admitting: Pulmonary Disease

## 2012-11-30 ENCOUNTER — Other Ambulatory Visit: Payer: Self-pay | Admitting: Pulmonary Disease

## 2012-11-30 MED ORDER — PRAVASTATIN SODIUM 40 MG PO TABS: 40.0000 mg | ORAL_TABLET | Freq: Every day | ORAL | Status: DC

## 2012-12-02 ENCOUNTER — Telehealth: Payer: Self-pay | Admitting: Pulmonary Disease

## 2012-12-02 MED ORDER — PRAVASTATIN SODIUM 40 MG PO TABS: 40.0000 mg | ORAL_TABLET | Freq: Every day | ORAL | Status: DC

## 2012-12-02 NOTE — Telephone Encounter (Signed)
Called and spoke with pt and she stated that she went to [facility] to pick up her pravastatin and this had not been filled.  Pharmacy is waiting on rx to be sent in.  This has been done per pts request and nothing further is needed.

## 2012-12-27 ENCOUNTER — Ambulatory Visit: Payer: Medicare Other | Admitting: Pulmonary Disease

## 2012-12-27 DIAGNOSIS — H903 Sensorineural hearing loss, bilateral: Secondary | ICD-10-CM | POA: Diagnosis not present

## 2012-12-27 DIAGNOSIS — H905 Unspecified sensorineural hearing loss: Secondary | ICD-10-CM | POA: Diagnosis not present

## 2012-12-27 DIAGNOSIS — H6120 Impacted cerumen, unspecified ear: Secondary | ICD-10-CM | POA: Diagnosis not present

## 2012-12-28 ENCOUNTER — Ambulatory Visit (INDEPENDENT_AMBULATORY_CARE_PROVIDER_SITE_OTHER): Payer: Medicare Other

## 2012-12-28 DIAGNOSIS — Z23 Encounter for immunization: Secondary | ICD-10-CM

## 2012-12-29 ENCOUNTER — Telehealth: Payer: Self-pay | Admitting: Pulmonary Disease

## 2012-12-29 DIAGNOSIS — Z23 Encounter for immunization: Secondary | ICD-10-CM | POA: Diagnosis not present

## 2012-12-29 MED ORDER — FUROSEMIDE 40 MG PO TABS: ORAL_TABLET | ORAL | Status: DC

## 2012-12-29 NOTE — Telephone Encounter (Signed)
Refill sent and pt is aware. Jennifer Castillo, CMA  

## 2013-02-03 ENCOUNTER — Other Ambulatory Visit: Payer: Self-pay | Admitting: Pulmonary Disease

## 2013-02-15 ENCOUNTER — Ambulatory Visit (INDEPENDENT_AMBULATORY_CARE_PROVIDER_SITE_OTHER): Payer: Medicare Other | Admitting: Pulmonary Disease

## 2013-02-15 ENCOUNTER — Encounter: Payer: Self-pay | Admitting: Pulmonary Disease

## 2013-02-15 ENCOUNTER — Telehealth: Payer: Self-pay | Admitting: Internal Medicine

## 2013-02-15 VITALS — BP 128/86 | HR 69 | Temp 97.5°F | Ht 63.0 in | Wt 177.0 lb

## 2013-02-15 DIAGNOSIS — I480 Paroxysmal atrial fibrillation: Secondary | ICD-10-CM

## 2013-02-15 DIAGNOSIS — Z951 Presence of aortocoronary bypass graft: Secondary | ICD-10-CM | POA: Diagnosis not present

## 2013-02-15 DIAGNOSIS — M545 Low back pain, unspecified: Secondary | ICD-10-CM

## 2013-02-15 DIAGNOSIS — N259 Disorder resulting from impaired renal tubular function, unspecified: Secondary | ICD-10-CM

## 2013-02-15 DIAGNOSIS — I6529 Occlusion and stenosis of unspecified carotid artery: Secondary | ICD-10-CM

## 2013-02-15 DIAGNOSIS — I4891 Unspecified atrial fibrillation: Secondary | ICD-10-CM

## 2013-02-15 DIAGNOSIS — K5730 Diverticulosis of large intestine without perforation or abscess without bleeding: Secondary | ICD-10-CM

## 2013-02-15 DIAGNOSIS — Z950 Presence of cardiac pacemaker: Secondary | ICD-10-CM

## 2013-02-15 DIAGNOSIS — E785 Hyperlipidemia, unspecified: Secondary | ICD-10-CM

## 2013-02-15 DIAGNOSIS — K219 Gastro-esophageal reflux disease without esophagitis: Secondary | ICD-10-CM

## 2013-02-15 DIAGNOSIS — I10 Essential (primary) hypertension: Secondary | ICD-10-CM

## 2013-02-15 DIAGNOSIS — M25551 Pain in right hip: Secondary | ICD-10-CM

## 2013-02-15 DIAGNOSIS — D649 Anemia, unspecified: Secondary | ICD-10-CM

## 2013-02-15 DIAGNOSIS — I2510 Atherosclerotic heart disease of native coronary artery without angina pectoris: Secondary | ICD-10-CM | POA: Diagnosis not present

## 2013-02-15 DIAGNOSIS — F068 Other specified mental disorders due to known physiological condition: Secondary | ICD-10-CM

## 2013-02-15 DIAGNOSIS — I38 Endocarditis, valve unspecified: Secondary | ICD-10-CM | POA: Diagnosis not present

## 2013-02-15 DIAGNOSIS — M1990 Unspecified osteoarthritis, unspecified site: Secondary | ICD-10-CM

## 2013-02-15 DIAGNOSIS — M810 Age-related osteoporosis without current pathological fracture: Secondary | ICD-10-CM

## 2013-02-15 DIAGNOSIS — M25559 Pain in unspecified hip: Secondary | ICD-10-CM

## 2013-02-15 MED ORDER — TRAMADOL HCL 50 MG PO TABS: 50.0000 mg | ORAL_TABLET | Freq: Three times a day (TID) | ORAL | Status: DC | PRN

## 2013-02-15 NOTE — Telephone Encounter (Signed)
Instructed pt on how to send transmission/kwm

## 2013-02-15 NOTE — Patient Instructions (Signed)
Today we updated your med list in our EPIC system...    Continue your current medications the same...  We wrote a new prescription for TRAMADOL 50mg - take one tab up to 3 times daily as needed for pain...    You may take this with one extra-strength Tylenol to boost it's effect...  Call for any questions...  Let's plan a follow up visit in April 2015 w/ FASTING blood work at that time.Marland KitchenMarland Kitchen

## 2013-02-15 NOTE — Telephone Encounter (Signed)
LMOM for return call//kwm  

## 2013-02-15 NOTE — Progress Notes (Signed)
Subjective:    Patient ID: [name], female    DOB: 07/11/1923, 79 y.o.   MRN: [id number]  HPI 79 y/o WF w/ mult med problems here for a follow up visit...   ~  May 27, 2011:  53mo ROV & she states she is doing well & has no new complaints or concerns> but weight is up 10# to 192# today and she notes some right hip pain for which she takes Tylenol; she ambulates w/ walker but does ok w/ her ADLs she says...    HBP> on Metop50Bid, Amlodip5, Lasix40- 2tabs daily; BP= 120/66 & she denies CP, palpit, syncope, SOB, edema...    CAD> s/p CABG 2003, followed by DrCrenshaw & last seen 9/11 (see below); we will refer for her overdue yearly f/u visit...    Valv heart dis> s/p tissue AVR & ThorAA repair in 2003; she remains too sedentary & rec to incr her exercise as able; Hx PAF in past but holding NSR x years now.    Carotid art dis & Hx TIA> on ASA 325mg /d; prev eval & rx by DrSethi; she was on Coumadin in past but this was stopped 2009 in [place]...    CHOL> on Prav40; FLP shows TChol 188, TG 269, HDL 47, LDL 96; needs better low fat diet...    Ortho> c/o right hip pain w/ known severe degen arthritis- she has declined surg (s/p prev left THR- DrAlusio 2004); uses Tylenol as needed; ambulates w/ cane/ walker...    Dementia/ Psyche> on Risperdone0.25Qhs and Celexa20mg /d; followed by [facility] for psychiatry... LABS 3/13:  FLP- at goals x TG=269;  Chems- ok w/ Creat=1.4;  CBC- wnl;  TSH=2.97;  BNP=109  ~  December 28, 2011:  35mo ROV & [name] says she's feeling well, no new complaints;  BP stable 7 controlled on Metop50Bid, Amlod5, Lasix40-2/d> BP= 118/72 & remains asymptomatic;  DrCrenshaw follows for CAD- last seen 4/13> s/p CABG & ThorAA repair w/ AVR, Hx PAF(holding NSR on ASA), etc; she knows about SBE prophy etc...  Chol has been ok on Prav40;  She does note some right hip area pain- on Tylenol alone & uses walker, rec f/u by Ortho for further eval...  She continues to f/u w/ DrPlovsky for Hx dementia  w/ psychotic features- on Celexa & Risperdal...    We reviewed prob list, meds, xrays and labs> see below for updates >> OK Flu vaccine today... LABS 10/13 showed BMet- stable w/ BUN=26, Creat=1.4   ~  June 27, 2012:  53mo ROV & since [name] was last seen she was hosp 10/21 - 01/13/12 by Cards for symptomatic CHB w/ Pacer insertion; known mult cardiac issues w/ HBP, CAD s/p CABG, AFib & not a coumadin cand due to falls, prev thor Ao aneurysm repair & AVR w/ bioprosthetic valve;  She had f/u ROV w/ DrAllred 3/14- doing well 7 pacer functioning normally, no changes in programming;  She had f/u appt w/ DrCrenshaw 4/14- doing well since pacer, continue SBE prophylaxis & other meds- no change; f/u 2DEcho 4/14 showed mod LVH w/ normal wall motion & EF=55-65%, bioprosthetic AoV w/ mild stenosis (mean grad=20, peak grad=33)...    She remains stable on Amlod5 & Lasix40-2/d, along w/ [id number]; BP=130/82 & she denies CP, palpit, ch in SOB/DOE, edema, etc...    FLP on Prav40 shows TChol 189, TG 239, HDL 38, LDL 108 & she needs better low fat diet & wt reduction- given diet counseling...    She remains on Miralax,  Senakot & reports good BMs etc...     She remains on Celexa25 & Risperdal 0.25mg  Qhs & reports that she rests well etc... We reviewed prob list, meds, xrays and labs> see below for updates >>  LABS 4/14:  FLP- not at goals on Prav40 & we reviewed diet/ exercise;  Chems- ok w/ Cr=1.3;  CBC- wnl;  TSH=2.28;  VitD=36 & rec to take 1000u daily supplement.  ~  February 15, 2013:  7-62mo ROV & [name] is doing reasonably well- states she has changed her diet & lost 9# down to 177# today; BP is controlled on her 2 meds and reads 128/86 today; Lipids regulated w/ Prav40;  She denies GI symptoms at present; rests well w/ the Risperdal 0.25mg  Qhs... She requests to get back on an April, Oct Q44mo schedule...    We reviewed prob list, meds, xrays and labs> see below for updates >> she had the 2014 Flu vaccine 10/14...             Problem List:  HEARING LOSS - ENT eval 9/10 by DrTeoh...  BRONCHITIS, RECURRENT (ICD-491.9) >> see above... Hx of PNEUMONIA (ICD-486)  HYPERTENSION (ICD-401.9) - on ASA 325mg /d, NORVASC 5mg /d, & LASIX 40mg - 2tabsQam...  ~  4/14:  BP=130/82 & doing well... denies HA, visual changes, CP, palipit, dizziness, syncope, change in dyspnea, or incr edema... ~  11/14:  BP= 128/86 & she remains asymptomatic...  CORONARY ARTERY DISEASE (ICD-414.00) - S/P CABG 2003 w/ vein graft to PDA... no CP, palpit, or change in dyspnea... she is still pretty sedentary... followed by Aletta Edouard for Cards... ~   last NuclearStressTest 6/08 showed RBBB, no ischemia, EF=76%... ~  labs 9/09 showed BNP= 353 on Lasix 80mg /d. ~  labs 1/10 showed BNP= 204 ~  saw DrCrenshaw 9/10 w/ 2DEcho- mild LVH, EF=60%, wall motion norm, tissue AoV prosthesis- OK. ~  f/u DrCrenshaw 9/11 & he felt she was stable & ordered repeat CDoppler & 2DEcho> reviewed... ~  f/u DrCrenshaw 4/13 reviewed & stable... ~  f/u DrCrenshaw 4/14 reviewed & stable> 2DEcho showed mod LVH w/ normal wall motion & EF=55-65%, bioprosthetic AoV w/ mild stenosis (mean grad=20, peak grad=33).   Hx of VALVULAR HEART DISEASE (ICD-424.90) - S/P tissue AVR & Thor AA repair 2003. ~  she knows about the need for SBE prophylaxis. ~  2DEcho 3/09 showed a bioprosthetic valve w/ 21mm gradient, & norm LVF w/ EF= 55-60%. ~  2DEcho 9/10- mild LVH, EF=60%, wall motion norm, tissue AoV prosthesis- working well... ~  2DEcho 9/11 showed mild LVH, normal wall motion w/ EF=60-65%, AoV prosthesis looks good, RV mild dil & RV function sl reduced. ~  2DEcho 4/13 showed mild LVH, norm LVF- no regional wall motion abn & EF=60-65%, redundant septum (borderline aneurysm) Gr1DD, mildMR. ~  2DEcho 4/14 showed mod LVH w/ normal wall motion & EF=55-65%, bioprosthetic AoV w/ mild stenosis (mean grad=20, peak grad=33).   Hx of ATRIAL FIBRILLATION (ICD-427.31) - PAF prev on coumadin &  stopped 2/09... she is holding NSR... Hx COMPLETE HEART BLOCK 10/13 w/ PACER insertion by drAllred >> ~  f/u DrAllred 3/14 w/ normally functioning pacer & pt doing well...  CEREBROVASCULAR ACCIDENT, HX OF (ICD-V12.50) - Eval and Rx DrSethi... She remains on ASA 325mg /d. ~  CDopplers 10/08 w/ tort ICA's, no plaque, normal carotids. ~  CDopplers 4/10 at [facility] read by [facility] showed bilat 40-59% ICA stenoses believed to be from tortuosity of vessels. ~  CDopplers 9/11 showed norm looking carotids w/  good flow but tort distal ICAs...  HYPERLIPIDEMIA (ICD-272.4) - she stopped the Crestor 5mg  again> she will agree to try PRAV40 since it is cheaper. ~  FLP 1/08 showed TChol 128, TG 94, HDL 40, LDL 69... ~  FLP 1/10 on diet showed TChol 247, TG 200, HDL 40, LDL 166... rec> start Crestor5. ~  FLP 7/10 on Cres5 showed TChol 157, TG 202, HDL 42, LDL 85... rec> continue same. ~  11/10: pt reports that she stopped the Crestor ?why- ?mild symptoms, advised to restart Rx. ~  FLP 3/11 on diet alone showed TChol 258, TG 359, HDL 49, LDL 140... start PRAV40. ~  FLP 9/11 on Prav40 showed TChol 210, TG 364, HDL 41, LDL 103... needs better low fat diet. ~  It has been hard to get her in FASTING for blood work. ~  FLP 3/13 on Prav40 showed TChol 188, TG 269, HDL 47, LDL 96... Needs better low fat diet & wt reduction. ~  FLP 4/14 on Prav40 showed TChol 189, TG 239, HDL 38, LDL 108  GERD (ICD-530.81) - on OMEPRAZOLE 20mg /d Prn & controls her symptoms.  DIVERTICULOSIS OF COLON (ICD-562.10) & HEMORRHOIDS (ICD-455.6) - uses Miralax Prn... ~  last colon 10/00 [facility] w/ divertics, hems, cecal AVM.  RENAL INSUFFICIENCY (ICD-588.9) & Hx of UTI (ICD-599.0) ~  labs 3/09 showed BUN= 20, Creat= 1.4 ~  labs 9/09 showed BUN= 17, Creat= 1.1 ~  labs 7/10 showed BUN= 21, Creat= 1.4 ~  labs 3/11 showed BUN= 17, Creat= 1.2 ~  labs 9/11 showed BUN= 22, Creat= 1.3 ~  Labs 3/12 showed BUN= 32, Creat= 1.5 ~  Labs 9/12 showed  BUN= 35, Creat= 1.4 ~  Labs 3/13 showed BUN= 26, Creat= 1.4 ~  Labs 10/13 showed BUN= 26, Creat= 1.4 ~  Labs 4/14 showed BUN= 20, Creat= 1.3  DEGENERATIVE JOINT DISEASE (ICD-715.90) - s/p left THR 7/04 by DrAlusio. ~  labs 7/10 showed Vit D level = 30... rec to start Vit D 1000 u daily OTC. ~  labs 3/11 showed Vit D level = 26... rec incr Vit D to 2000 u daily.  BACK PAIN, LUMBAR (ICD-724.2) - Hosp 4/10 after fall at home w/ L1 compression fx & retropulsed fragment in canal- eval by DrElsner w/ rec for conservative management w/ brace, rest, rehab...  DEMENTIA (ICD-294.8) - Prev hx DELIRIUM w/ Abn MRI showing atrophy, sm vessel dis, old infarct... then hosp 12/08 w/ aud hallucinations & psych rx from DrWilliford w/ Risperdal, Depakote, Ativan... 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 she is now stable on a complex psychotopic regimen per [facility], Psychiatry... see med list... ~  1/10:  now off Risperdal... on Depakote 250mg /d, Celexa 20mg - 1.5tabsQhs. ~  6/10:  now off Depakote, on Celexa alone & followed by DrPlovsky. ~  3/11:  She states that "my mind is back where it should be" & "I don't have dementia";  states she's driving now & I cautioned her and her husb about this. ~  9/12:  Followed by DrPlovsky & back on Celexa (she was hearing "singing" & improved back on Rx). ~  3/13 & 10/13:  Stable on Risperdal & Celexa from DrPlovsky...  ANXIETY (ICD-300.00)  ANEMIA (  ICD-285.9) ~  labs 3/09 showed Hg= 11.7 ~  labs 9/09 showed Hg 12.2 ~  labs 1/10 showed Hg= 12.8 ~  labs 7/10 showed Hg= 13.0 ~  labs 3/11 showed Hg= 12.8 ~  Labs 3/12 showed Hg= 12.8 ~  Labs 9/12 showed Hg= 12.9 ~  Labs 3/13 showed Hg= 13.1 ~   Labs 4/14 showed Hg= 13.1   Past Surgical History  Procedure Laterality Date  . Total hip arthroplasty      left  . Vesicovaginal fistula closure w/ tah    . Intraocular lens insertion      bilateral  . Coronary artery bypass graft    . Aortic valve replacement    . Cataract extraction      bilateral  . Pacemaker insertion  01/12/12    SJM [employer] DR RF implanted by Dr Johney Frame for CHB    Outpatient Encounter Prescriptions as of 02/15/2013  Medication Sig  . acetaminophen (TYLENOL) 325 MG tablet Take 650 mg by mouth every 6 (six) hours as needed.  Marland Kitchen amLODipine (NORVASC) 5 MG tablet TAKE 1 TABLET (5 MG TOTAL) BY MOUTH DAILY.  Marland Kitchen aspirin 325 MG tablet Take 325 mg by mouth daily.  . betamethasone dipropionate 0.05 % lotion APPLY TOPICALLY 2 (TWO) TIMES DAILY.  . calcium carbonate (TUMS - DOSED IN MG ELEMENTAL CALCIUM) 500 MG chewable tablet Chew 2 tablets by mouth daily as needed.   . cholecalciferol (VITAMIN D) 1000 UNITS tablet Take 1,000 Units by mouth daily.  . citalopram (CELEXA) 20 MG tablet Take 20 mg by mouth daily.    . folic acid (FOLVITE) 1 MG tablet TAKE 1 TABLET EVERY DAY  . furosemide (LASIX) 40 MG tablet TAKE 2 TABLETS EVERY MORNING  . polyethylene glycol powder (MIRALAX) powder Take 17 g by mouth as needed.    . pravastatin (PRAVACHOL) 40 MG tablet Take 1 tablet (40 mg total) by mouth at bedtime.  . risperiDONE (RISPERDAL) 0.25 MG tablet Take 1 tablet by mouth at bedtime.  . senna-docusate (SENOKOT-S) 8.6-50 MG per tablet Take 2 tablets by mouth daily as needed.   . [DISCONTINUED] amLODipine (NORVASC) 5 MG tablet Take 5 mg by mouth daily.    Allergies  Allergen Reactions  . Clarithromycin     REACTION: abd pain and nausea  . Coumadin [Warfarin Sodium] Other (See Comments)    hallucinations   . Iohexol      Desc: sob with ivp approx 20 yrs ago, pt was premedicated with 13hr prep with no problems kdean, Onset Date: [id number]   . Penicillins     REACTION: abd pain  and nausea  . Prednisone     REACTION: swelling and nausea  . Statins     REACTION: abd pain and dark stools  . Sulfonamide Derivatives     REACTION: abd pain and nausea    Current Medications, Allergies, Past Medical History, Past Surgical History, Family History, and Social History were reviewed in [employer] record.    Review of Systems        See HPI - all other systems neg except as noted...  The patient complains of decreased hearing, dyspnea on exertion, peripheral edema, incontinence, and difficulty walking.  The patient denies anorexia, fever, weight loss, weight gain, vision loss, hoarseness, chest pain, syncope, prolonged cough, headaches, hemoptysis, abdominal pain, melena, hematochezia, severe indigestion/heartburn, hematuria, muscle weakness, suspicious skin lesions, transient blindness, depression, unusual weight change, abnormal bleeding, enlarged lymph nodes, and angioedema.  Objective:   Physical Exam     WD, sl overweight, 79 y/o WF in NAD... flattened affect apparent, without change... GENERAL:  Alert & oriented x3... HEENT:  [place]/AT, EOM-wnl, PERRLA, EACs-clear, TMs-wnl, NOSE-clear, THROAT- sl dry, clear & wnl. NECK:  Supple w/ fairROM; no JVD; normal carotid impulses w/o bruits; no thyromegaly or nodules palpated; no lymphadenopathy. CHEST:  Clear to P & A; without wheezes/ rales/ or rhonchi heard... mild congested cough... HEART: s/p med sternotomy, regular,  gr1/6 SEM without rubs or gallops apprec... ABDOMEN:  Soft & nontender; normal bowel sounds; no organomegaly or masses detected. EXT: without deformities, s/p left THR, mod arthritic changes; no varicose veins/ +venous insuffic/ tr edema is noted... NEURO:  CN's intact;  Hx back brace, L1 compression, no focal neuro changes... DERM:  No lesions noted; no rash etc...  RADIOLOGY DATA:  Reviewed in the EPIC EMR & discussed w/ the patient...  LABORATORY DATA:  Reviewed in the EPIC EMR  & discussed w/ the patient...   Assessment & Plan:    HBP>  BP controlled on BBlocker, CCB, Diuretic; continue same + low sodium diet etc...  Cardiac> CAD, s/p CABG, s/p AVR tissue valve & ThorAA repair>  Followed by Aletta Edouard for Cards... Hx PAF>  Holding NSR until her CHB & she is on ASA325 daily... S/P PACER 10/13 for CHB> followed by DrAllred & doing satis...  Hyperlipidemia>  On Prav40 + low chol, low fat diet & FLP shows good chol parameters but elev TG- needs better low fat diet.  GI> GERD, Divertics, Hems>  On Omep, Miralax, etc as needed...  Renal Insuffic>  Stable w/ Creat in the 1.4-1.5 range...  DJD/ s/p leftTHR>  Followed by DrGioffre, DrAlusio et al; she needs right THR as well but declines surg & treating w/ support, cane, Tylenol...  LBP>  Hx fall w/ L1 fx & eval by DrElsner who rec conservative management...  DEMENTIA>  She has hx delirium w/ hallucinations & psyche rx; now followed by DrPlovsky on Celexa...  Other medical problems as noted...   Patient's Medications  New Prescriptions   TRAMADOL (ULTRAM) 50 MG TABLET    Take 1 tablet (50 mg total) by mouth 3 (three) times daily as needed.  Previous Medications   ACETAMINOPHEN (TYLENOL) 325 MG TABLET    Take 650 mg by mouth every 6 (six) hours as needed.   AMLODIPINE (NORVASC) 5 MG TABLET    TAKE 1 TABLET (5 MG TOTAL) BY MOUTH DAILY.   ASPIRIN 325 MG TABLET    Take 325 mg by mouth daily.   BETAMETHASONE DIPROPIONATE 0.05 % LOTION    APPLY TOPICALLY 2 (TWO) TIMES DAILY.   CALCIUM CARBONATE (TUMS - DOSED IN MG ELEMENTAL CALCIUM) 500 MG CHEWABLE TABLET    Chew 2 tablets by mouth daily as needed.    CHOLECALCIFEROL (VITAMIN D) 1000 UNITS TABLET    Take 1,000 Units by mouth daily.   CITALOPRAM (CELEXA) 20 MG TABLET    Take 20 mg by mouth daily.     FOLIC ACID (FOLVITE) 1 MG TABLET    TAKE 1 TABLET EVERY DAY   FUROSEMIDE (LASIX) 40 MG TABLET    TAKE 2 TABLETS EVERY MORNING   POLYETHYLENE GLYCOL POWDER (MIRALAX)  POWDER    Take 17 g by mouth as needed.     PRAVASTATIN (PRAVACHOL) 40 MG TABLET    Take 1 tablet (40 mg total) by mouth at bedtime.   RISPERIDONE (RISPERDAL) 0.25 MG TABLET  Take 1 tablet by mouth at bedtime.   SENNA-DOCUSATE (SENOKOT-S) 8.6-50 MG PER TABLET    Take 2 tablets by mouth daily as needed.   Modified Medications   No medications on file  Discontinued Medications   AMLODIPINE (NORVASC) 5 MG TABLET    Take 5 mg by mouth daily.

## 2013-02-15 NOTE — Telephone Encounter (Signed)
New Problem:  Pt's husband is calling stating his wife is scheduled for a remote transmission on Monday and neither one of them know how to send a transmission. Pt is requesting someone call with instructions. Please advise

## 2013-02-20 ENCOUNTER — Encounter: Payer: Medicare Other | Admitting: *Deleted

## 2013-02-20 DIAGNOSIS — I442 Atrioventricular block, complete: Secondary | ICD-10-CM

## 2013-02-20 DIAGNOSIS — Z950 Presence of cardiac pacemaker: Secondary | ICD-10-CM

## 2013-02-20 DIAGNOSIS — I441 Atrioventricular block, second degree: Secondary | ICD-10-CM

## 2013-02-20 LAB — MDC_IDC_ENUM_SESS_TYPE_REMOTE
Battery Remaining Longevity: 92 mo
Battery Voltage: 2.93 V
Brady Statistic AP VP Percent: 3.4 %
Brady Statistic RV Percent Paced: 88 %
Implantable Pulse Generator Model: 2210
Lead Channel Impedance Value: 610 Ohm
Lead Channel Pacing Threshold Amplitude: 0.25 V
Lead Channel Pacing Threshold Amplitude: 0.625 V
Lead Channel Pacing Threshold Pulse Width: 0.4 ms

## 2013-02-26 ENCOUNTER — Other Ambulatory Visit: Payer: Self-pay | Admitting: Internal Medicine

## 2013-02-26 ENCOUNTER — Encounter: Payer: Self-pay | Admitting: Internal Medicine

## 2013-03-07 ENCOUNTER — Encounter: Payer: Self-pay | Admitting: *Deleted

## 2013-03-08 ENCOUNTER — Other Ambulatory Visit: Payer: Self-pay | Admitting: Pulmonary Disease

## 2013-04-18 DIAGNOSIS — F0392 Unspecified dementia, unspecified severity, with psychotic disturbance: Secondary | ICD-10-CM | POA: Diagnosis not present

## 2013-05-22 ENCOUNTER — Telehealth: Payer: Self-pay | Admitting: Internal Medicine

## 2013-05-22 NOTE — Telephone Encounter (Signed)
Spoke w/husband and answered all questions regarding transmitter/kwm

## 2013-05-22 NOTE — Telephone Encounter (Signed)
New Message  Pt called// requests a call back to discuss Remote pacer check//Please call

## 2013-05-24 ENCOUNTER — Encounter: Payer: Self-pay | Admitting: Internal Medicine

## 2013-05-24 ENCOUNTER — Ambulatory Visit (INDEPENDENT_AMBULATORY_CARE_PROVIDER_SITE_OTHER): Payer: Medicare Other | Admitting: *Deleted

## 2013-05-24 DIAGNOSIS — Z950 Presence of cardiac pacemaker: Secondary | ICD-10-CM | POA: Diagnosis not present

## 2013-05-24 DIAGNOSIS — I441 Atrioventricular block, second degree: Secondary | ICD-10-CM

## 2013-05-28 ENCOUNTER — Other Ambulatory Visit: Payer: Self-pay | Admitting: Pulmonary Disease

## 2013-05-31 ENCOUNTER — Telehealth: Payer: Self-pay | Admitting: Pulmonary Disease

## 2013-05-31 NOTE — Telephone Encounter (Signed)
ATC line busy x 4 wcb 

## 2013-06-01 NOTE — Telephone Encounter (Signed)
I spoke with pt spouse and advised SN retiring from [facility] on April 1. They want to see Dr. Asa Lente so per Marliss Czar I have provided them with the # to [facility] primary care to set an appt. Cimarron Bing, CMA

## 2013-06-02 ENCOUNTER — Other Ambulatory Visit: Payer: Self-pay | Admitting: Pulmonary Disease

## 2013-06-05 ENCOUNTER — Telehealth: Payer: Self-pay | Admitting: Pulmonary Disease

## 2013-06-05 NOTE — Telephone Encounter (Signed)
Rx has been sent in. Pt's husband is aware.  Has upcoming appointment with Asa Lente for primary care.

## 2013-06-15 ENCOUNTER — Other Ambulatory Visit: Payer: Self-pay | Admitting: Pulmonary Disease

## 2013-06-15 MED ORDER — FUROSEMIDE 40 MG PO TABS: ORAL_TABLET | ORAL | Status: DC

## 2013-06-23 LAB — MDC_IDC_ENUM_SESS_TYPE_REMOTE
Battery Remaining Longevity: 99 mo
Battery Voltage: 2.95 V
Brady Statistic AP VP Percent: 2.5 %
Brady Statistic AS VS Percent: 6.6 %
Brady Statistic RA Percent Paced: 1.7 %
Brady Statistic RV Percent Paced: 92 %
Implantable Pulse Generator Model: 2210
Implantable Pulse Generator Serial Number: 7393244
Lead Channel Impedance Value: 600 Ohm
Lead Channel Pacing Threshold Amplitude: 0.25 V
Lead Channel Pacing Threshold Pulse Width: 0.4 ms
Lead Channel Sensing Intrinsic Amplitude: 5 mV
Lead Channel Setting Pacing Pulse Width: 0.4 ms
MDC IDC MSMT LEADCHNL RV IMPEDANCE VALUE: 740 Ohm
MDC IDC MSMT LEADCHNL RV PACING THRESHOLD AMPLITUDE: 0.625 V
MDC IDC MSMT LEADCHNL RV PACING THRESHOLD PULSEWIDTH: 0.4 ms
MDC IDC MSMT LEADCHNL RV SENSING INTR AMPL: 7.8 mV
MDC IDC SESS DTM: 20150304084315
MDC IDC SET LEADCHNL RA PACING AMPLITUDE: 1.25 V
MDC IDC SET LEADCHNL RV PACING AMPLITUDE: 0.875
MDC IDC SET LEADCHNL RV SENSING SENSITIVITY: 2 mV
MDC IDC STAT BRADY AP VS PERCENT: 1 %
MDC IDC STAT BRADY AS VP PERCENT: 90 %

## 2013-06-28 ENCOUNTER — Other Ambulatory Visit: Payer: Self-pay | Admitting: Pulmonary Disease

## 2013-06-30 ENCOUNTER — Encounter: Payer: Self-pay | Admitting: *Deleted

## 2013-07-10 ENCOUNTER — Ambulatory Visit: Payer: Medicare Other | Admitting: Pulmonary Disease

## 2013-08-08 DIAGNOSIS — H6120 Impacted cerumen, unspecified ear: Secondary | ICD-10-CM | POA: Diagnosis not present

## 2013-08-08 DIAGNOSIS — H903 Sensorineural hearing loss, bilateral: Secondary | ICD-10-CM | POA: Diagnosis not present

## 2013-08-08 DIAGNOSIS — H608X9 Other otitis externa, unspecified ear: Secondary | ICD-10-CM | POA: Diagnosis not present

## 2013-08-08 DIAGNOSIS — H905 Unspecified sensorineural hearing loss: Secondary | ICD-10-CM | POA: Diagnosis not present

## 2013-08-28 ENCOUNTER — Ambulatory Visit (INDEPENDENT_AMBULATORY_CARE_PROVIDER_SITE_OTHER): Payer: Medicare Other | Admitting: *Deleted

## 2013-08-28 ENCOUNTER — Other Ambulatory Visit: Payer: Self-pay | Admitting: Pulmonary Disease

## 2013-08-28 DIAGNOSIS — Z950 Presence of cardiac pacemaker: Secondary | ICD-10-CM

## 2013-08-28 DIAGNOSIS — I441 Atrioventricular block, second degree: Secondary | ICD-10-CM | POA: Diagnosis not present

## 2013-08-28 NOTE — Progress Notes (Signed)
Remote pacemaker transmission.   

## 2013-08-31 ENCOUNTER — Encounter: Payer: Self-pay | Admitting: Internal Medicine

## 2013-08-31 ENCOUNTER — Other Ambulatory Visit (INDEPENDENT_AMBULATORY_CARE_PROVIDER_SITE_OTHER): Payer: Medicare Other

## 2013-08-31 ENCOUNTER — Ambulatory Visit (INDEPENDENT_AMBULATORY_CARE_PROVIDER_SITE_OTHER): Payer: Medicare Other | Admitting: Internal Medicine

## 2013-08-31 VITALS — BP 132/70 | HR 72 | Temp 97.6°F | Ht 63.0 in | Wt 171.4 lb

## 2013-08-31 DIAGNOSIS — I2510 Atherosclerotic heart disease of native coronary artery without angina pectoris: Secondary | ICD-10-CM | POA: Diagnosis not present

## 2013-08-31 DIAGNOSIS — I10 Essential (primary) hypertension: Secondary | ICD-10-CM | POA: Diagnosis not present

## 2013-08-31 DIAGNOSIS — Z950 Presence of cardiac pacemaker: Secondary | ICD-10-CM

## 2013-08-31 DIAGNOSIS — Z0000 Encounter for general adult medical examination without abnormal findings: Secondary | ICD-10-CM | POA: Diagnosis not present

## 2013-08-31 DIAGNOSIS — Z23 Encounter for immunization: Secondary | ICD-10-CM

## 2013-08-31 DIAGNOSIS — E785 Hyperlipidemia, unspecified: Secondary | ICD-10-CM

## 2013-08-31 LAB — MDC_IDC_ENUM_SESS_TYPE_REMOTE
Battery Remaining Longevity: 90 mo
Brady Statistic AP VP Percent: 4.9 %
Brady Statistic AP VS Percent: 1 %
Brady Statistic AS VP Percent: 84 %
Brady Statistic AS VS Percent: 8.8 %
Brady Statistic RA Percent Paced: 3.9 %
Brady Statistic RV Percent Paced: 89 %
Lead Channel Impedance Value: 700 Ohm
Lead Channel Pacing Threshold Amplitude: 0.25 V
Lead Channel Pacing Threshold Amplitude: 0.625 V
Lead Channel Pacing Threshold Pulse Width: 0.4 ms
Lead Channel Sensing Intrinsic Amplitude: 12 mV
Lead Channel Setting Pacing Amplitude: 0.875
Lead Channel Setting Pacing Amplitude: 1.25 V
Lead Channel Setting Pacing Pulse Width: 0.4 ms
Lead Channel Setting Sensing Sensitivity: 2 mV
MDC IDC MSMT BATTERY REMAINING PERCENTAGE: 65 %
MDC IDC MSMT BATTERY VOLTAGE: 2.93 V
MDC IDC MSMT LEADCHNL RA IMPEDANCE VALUE: 610 Ohm
MDC IDC MSMT LEADCHNL RA PACING THRESHOLD PULSEWIDTH: 0.4 ms
MDC IDC MSMT LEADCHNL RA SENSING INTR AMPL: 5 mV
MDC IDC PG SERIAL: 7393244
MDC IDC SESS DTM: 20150608071853

## 2013-08-31 LAB — BASIC METABOLIC PANEL
BUN: 30 mg/dL — AB (ref 6–23)
CHLORIDE: 102 meq/L (ref 96–112)
CO2: 28 meq/L (ref 19–32)
Calcium: 9.4 mg/dL (ref 8.4–10.5)
Creatinine, Ser: 1.3 mg/dL — ABNORMAL HIGH (ref 0.4–1.2)
GFR: 42.23 mL/min — ABNORMAL LOW (ref >60.00)
Glucose, Bld: 86 mg/dL (ref 70–99)
Potassium: 4.1 mEq/L (ref 3.5–5.1)
Sodium: 139 mEq/L (ref 135–145)

## 2013-08-31 LAB — CBC WITH DIFFERENTIAL/PLATELET
BASOS PCT: 0.5 % (ref 0.0–3.0)
Basophils Absolute: 0 10*3/uL (ref 0.0–0.1)
EOS PCT: 1.2 % (ref 0.0–5.0)
Eosinophils Absolute: 0.1 10*3/uL (ref 0.0–0.7)
HCT: 39.8 % (ref 36.0–46.0)
Hemoglobin: 13.2 g/dL (ref 12.0–15.0)
Lymphocytes Relative: 17.6 % (ref 12.0–46.0)
Lymphs Abs: 1.4 10*3/uL (ref 0.7–4.0)
MCHC: 33.3 g/dL (ref 30.0–36.0)
MCV: 90.8 fl (ref 78.0–100.0)
Monocytes Absolute: 0.7 10*3/uL (ref 0.1–1.0)
Monocytes Relative: 9.7 % (ref 3.0–12.0)
Neutro Abs: 5.5 10*3/uL (ref 1.4–7.7)
Neutrophils Relative %: 71 % (ref 43.0–77.0)
Platelets: 232 10*3/uL (ref 150.0–400.0)
RBC: 4.38 Mil/uL (ref 3.87–5.11)
RDW: 15.8 % — ABNORMAL HIGH (ref 11.5–15.5)
WBC: 7.7 10*3/uL (ref 4.0–10.5)

## 2013-08-31 LAB — HEPATIC FUNCTION PANEL
ALBUMIN: 3.9 g/dL (ref 3.5–5.2)
ALT: 7 U/L (ref 0–35)
AST: 17 U/L (ref 0–37)
Alkaline Phosphatase: 80 U/L (ref 39–117)
Bilirubin, Direct: 0.1 mg/dL (ref 0.0–0.3)
TOTAL PROTEIN: 7.3 g/dL (ref 6.0–8.3)
Total Bilirubin: 0.6 mg/dL (ref 0.2–1.2)

## 2013-08-31 LAB — TSH: TSH: 1.24 u[IU]/mL (ref 0.35–4.50)

## 2013-08-31 LAB — LIPID PANEL
Cholesterol: 177 mg/dL (ref 0–200)
HDL: 47.3 mg/dL (ref >39.00)
LDL Cholesterol: 98 mg/dL (ref 0–99)
NonHDL: 129.7
TRIGLYCERIDES: 161 mg/dL — AB (ref 0.0–149.0)
Total CHOL/HDL Ratio: 4
VLDL: 32.2 mg/dL (ref 0.0–40.0)

## 2013-08-31 NOTE — Addendum Note (Signed)
Addended by: Earnstine Regal on: 08/31/2013 10:50 AM   Modules accepted: Orders

## 2013-08-31 NOTE — Assessment & Plan Note (Signed)
Remote CABG Also TAA repair with [employer] bioprosthetic AVR 2003 No anginal symptoms or cardiac complaints Medical management as ongoing, followup with cardiology and EP annually/as needed

## 2013-08-31 NOTE — Assessment & Plan Note (Signed)
On statin for CAD hx Check annually and titrate as needed 

## 2013-08-31 NOTE — Assessment & Plan Note (Signed)
Complete heart block with subsequent pacemaker October 2013 Follows with EP for same

## 2013-08-31 NOTE — Patient Instructions (Addendum)
It was good to see you today.  We have reviewed your prior records including labs and tests today  Health Maintenance reviewed - pneumonia vaccine updated today -all other recommended immunizations and age-appropriate screenings are up-to-date.  Test(s) ordered today. Your results will be released to [place] (or called to you) after review, usually within 72hours after test completion. If any changes need to be made, you will be notified at that same time.  Medications reviewed and updated, no changes recommended at this time.  Please schedule followup in 6 months for semiannual exam, call sooner if problems.  Health Maintenance, Female A healthy lifestyle and preventative care can promote health and wellness.  Maintain regular health, dental, and eye exams.  Eat a healthy diet. Foods like vegetables, fruits, whole grains, low-fat dairy products, and lean protein foods contain the nutrients you need without too many calories. Decrease your intake of foods high in solid fats, added sugars, and salt. Get information about a proper diet from your caregiver, if necessary.  Regular physical exercise is one of the most important things you can do for your health. Most adults should get at least 150 minutes of moderate-intensity exercise (any activity that increases your heart rate and causes you to sweat) each week. In addition, most adults need muscle-strengthening exercises on 2 or more days a week.   Maintain a healthy weight. The body mass index (BMI) is a screening tool to identify possible weight problems. It provides an estimate of body fat based on height and weight. Your caregiver can help determine your BMI, and can help you achieve or maintain a healthy weight. For adults 20 years and older:  A BMI below 18.5 is considered underweight.  A BMI of 18.5 to 24.9 is normal.  A BMI of 25 to 29.9 is considered overweight.  A BMI of 30 and above is considered obese.  Maintain normal  blood lipids and cholesterol by exercising and minimizing your intake of saturated fat. Eat a balanced diet with plenty of fruits and vegetables. Blood tests for lipids and cholesterol should begin at age 69 and be repeated every 5 years. If your lipid or cholesterol levels are high, you are over 50, or you are a high risk for heart disease, you may need your cholesterol levels checked more frequently.Ongoing high lipid and cholesterol levels should be treated with medicines if diet and exercise are not effective.  If you smoke, find out from your caregiver how to quit. If you do not use tobacco, do not start.  Lung cancer screening is recommended for adults aged 98 80 years who are at high risk for developing lung cancer because of a history of smoking. Yearly low-dose computed tomography (CT) is recommended for people who have at least a 30-pack-year history of smoking and are a current smoker or have quit within the past 15 years. A pack year of smoking is smoking an average of 1 pack of cigarettes a day for 1 year (for example: 1 pack a day for 30 years or 2 packs a day for 15 years). Yearly screening should continue until the smoker has stopped smoking for at least 15 years. Yearly screening should also be stopped for people who develop a health problem that would prevent them from having lung cancer treatment.  If you are pregnant, do not drink alcohol. If you are breastfeeding, be very cautious about drinking alcohol. If you are not pregnant and choose to drink alcohol, do not exceed 1 drink per  day. One drink is considered to be 12 ounces (355 mL) of beer, 5 ounces (148 mL) of wine, or 1.5 ounces (44 mL) of liquor.  Avoid use of street drugs. Do not share needles with anyone. Ask for help if you need support or instructions about stopping the use of drugs.  High blood pressure causes heart disease and increases the risk of stroke. Blood pressure should be checked at least every 1 to 2 years.  Ongoing high blood pressure should be treated with medicines, if weight loss and exercise are not effective.  If you are 59 to 78 years old, ask your caregiver if you should take aspirin to prevent strokes.  Diabetes screening involves taking a blood sample to check your fasting blood sugar level. This should be done once every 3 years, after age 64, if you are within normal weight and without risk factors for diabetes. Testing should be considered at a younger age or be carried out more frequently if you are overweight and have at least 1 risk factor for diabetes.  Breast cancer screening is essential preventative care for women. You should practice "breast self-awareness." This means understanding the normal appearance and feel of your breasts and may include breast self-examination. Any changes detected, no matter how small, should be reported to a caregiver. Women in their 64s and 30s should have a clinical breast exam (CBE) by a caregiver as part of a regular health exam every 1 to 3 years. After age 21, women should have a CBE every year. Starting at age 67, women should consider having a mammogram (breast X-ray) every year. Women who have a family history of breast cancer should talk to their caregiver about genetic screening. Women at a high risk of breast cancer should talk to their caregiver about having an MRI and a mammogram every year.  Breast cancer gene (BRCA)-related cancer risk assessment is recommended for women who have family members with BRCA-related cancers. BRCA-related cancers include breast, ovarian, tubal, and peritoneal cancers. Having family members with these cancers may be associated with an increased risk for harmful changes (mutations) in the breast cancer genes BRCA1 and BRCA2. Results of the assessment will determine the need for genetic counseling and BRCA1 and BRCA2 testing.  The Pap test is a screening test for cervical cancer. Women should have a Pap test starting at  age 55. Between ages 25 and 46, Pap tests should be repeated every 2 years. Beginning at age 43, you should have a Pap test every 3 years as long as the past 3 Pap tests have been normal. If you had a hysterectomy for a problem that was not cancer or a condition that could lead to cancer, then you no longer need Pap tests. If you are between ages 36 and 39, and you have had normal Pap tests going back 10 years, you no longer need Pap tests. If you have had past treatment for cervical cancer or a condition that could lead to cancer, you need Pap tests and screening for cancer for at least 20 years after your treatment. If Pap tests have been discontinued, risk factors (such as a new sexual partner) need to be reassessed to determine if screening should be resumed. Some women have medical problems that increase the chance of getting cervical cancer. In these cases, your caregiver may recommend more frequent screening and Pap tests.  The human papillomavirus (HPV) test is an additional test that may be used for cervical cancer screening. The HPV  test looks for the virus that can cause the cell changes on the cervix. The cells collected during the Pap test can be tested for HPV. The HPV test could be used to screen women aged 36 years and older, and should be used in women of any age who have unclear Pap test results. After the age of 17, women should have HPV testing at the same frequency as a Pap test.  Colorectal cancer can be detected and often prevented. Most routine colorectal cancer screening begins at the age of 5 and continues through age 21. However, your caregiver may recommend screening at an earlier age if you have risk factors for colon cancer. On a yearly basis, your caregiver may provide home test kits to check for hidden blood in the stool. Use of a small camera at the end of a tube, to directly examine the colon (sigmoidoscopy or colonoscopy), can detect the earliest forms of colorectal cancer.  Talk to your caregiver about this at age 25, when routine screening begins. Direct examination of the colon should be repeated every 5 to 10 years through age 93, unless early forms of pre-cancerous polyps or small growths are found.  Hepatitis C blood testing is recommended for all people born from [birth year] through 1963 and any individual with known risks for hepatitis C.  Practice safe sex. Use condoms and avoid high-risk sexual practices to reduce the spread of sexually transmitted infections (STIs). Sexually active women aged 72 and younger should be checked for Chlamydia, which is a common sexually transmitted infection. Older women with new or multiple partners should also be tested for Chlamydia. Testing for other STIs is recommended if you are sexually active and at increased risk.  Osteoporosis is a disease in which the bones lose minerals and strength with aging. This can result in serious bone fractures. The risk of osteoporosis can be identified using a bone density scan. Women ages 59 and over and women at risk for fractures or osteoporosis should discuss screening with their caregivers. Ask your caregiver whether you should be taking a calcium supplement or vitamin D to reduce the rate of osteoporosis.  Menopause can be associated with physical symptoms and risks. Hormone replacement therapy is available to decrease symptoms and risks. You should talk to your caregiver about whether hormone replacement therapy is right for you.  Use sunscreen. Apply sunscreen liberally and repeatedly throughout the day. You should seek shade when your shadow is shorter than you. Protect yourself by wearing long sleeves, pants, a wide-brimmed hat, and sunglasses year round, whenever you are outdoors.  Notify your caregiver of new moles or changes in moles, especially if there is a change in shape or color. Also notify your caregiver if a mole is larger than the size of a pencil eraser.  Stay current with your  immunizations. Document Released: 09/22/2010 Document Revised: 07/04/2012 Document Reviewed: 09/22/2010 [facility] Patient Information 2014 [employer].

## 2013-08-31 NOTE — Progress Notes (Signed)
Pre visit review using our clinic review tool, if applicable. No additional management support is needed unless otherwise documented below in the visit note. 

## 2013-08-31 NOTE — Assessment & Plan Note (Signed)
BP Readings from Last 3 Encounters:  08/31/13 132/70  02/15/13 128/86  11/17/12 114/68   The current medical regimen is effective;  continue present plan and medications.

## 2013-08-31 NOTE — Progress Notes (Signed)
Subjective:    Patient ID: [name], female    DOB: 10-Feb-1924, 80 y.o.   MRN: [id number]  HPI  New patient to me - here to establish with new PCP - transfer from [place]   Here for medicare wellness  Diet: heart healthy Physical activity: sedentary, uses walker Depression/mood screen: negative Hearing: intact to whispered voice Visual acuity: grossly normal, performs annual eye exam  ADLs: capable Fall risk: none Home safety: good Cognitive evaluation: intact to orientation, naming, recall and repetition EOL planning: adv directives  I have personally reviewed and have noted 1. The patient's medical and social history 2. Their use of alcohol, tobacco or illicit drugs 3. Their current medications and supplements 4. The patient's functional ability including ADL's, fall risks, home safety risks and hearing or visual impairment. 5. Diet and physical activities 6. Evidence for depression or mood disorders  Also reviewed chronic medical issues and interval medical events  Past Medical History  Diagnosis Date  . Unspecified chronic bronchitis   . HTN (hypertension)   . CAD (coronary artery disease)     s/p CABG  . S/P AVR (aortic valve replacement) 2003    bioprosthetic   . Atrial fibrillation     not anticoagulation candidate due to falls  . Carotid artery stenosis   . Hyperlipidemia   . Esophageal reflux   . Diverticulosis of colon (without mention of hemorrhage)   . Osteoarthrosis, unspecified whether generalized or localized, unspecified site   . Osteoporosis, unspecified   . Dementia   . Anxiety state, unspecified   . Anemia, unspecified   . Complete heart block 12/2011    s/p St.Jude dual chamber PPM 01/12/12  . Thoracic aortic aneurysm 2003    s/p repair  . Pacemaker-St.Jude 01/13/2012   Family History  Problem Relation Age of Onset  . Diabetes Brother   . Diabetes Sister    History  Substance Use Topics  . Smoking status: Never Smoker   .  Smokeless tobacco: Never Used  . Alcohol Use: No   Review of Systems  Constitutional: Negative for fatigue and unexpected weight change (working on portion control for weight reduction).  Respiratory: Negative for cough, shortness of breath and wheezing.   Cardiovascular: Negative for chest pain, palpitations and leg swelling.  Gastrointestinal: Negative for nausea, abdominal pain and diarrhea.  Neurological: Negative for dizziness, weakness, light-headedness and headaches.  Psychiatric/Behavioral: Negative for dysphoric mood. The patient is not nervous/anxious.   All other systems reviewed and are negative.      Objective:   Physical Exam  BP 132/70  Pulse 72  Temp(Src) 97.6 F (36.4 C) (Oral)  Ht 5\' 3"  (1.6 m)  Wt 171 lb 6.4 oz (77.747 kg)  BMI 30.37 kg/m2  SpO2 95% Wt Readings from Last 3 Encounters:  08/31/13 171 lb 6.4 oz (77.747 kg)  02/15/13 177 lb (80.287 kg)  11/17/12 187 lb (84.823 kg)   Constitutional: She appears well-developed and well-nourished. No distress. Spouse at side Neck: Normal range of motion. Neck supple. No JVD present. No thyromegaly present.  Cardiovascular: Normal rate, regular rhythm and normal heart sounds.  No murmur heard. No BLE edema. Pulmonary/Chest: Effort normal and breath sounds normal. No respiratory distress. She has no wheezes.  Psychiatric: She has a flat facies, mild slow response, but normal mood and affect. Her behavior is normal. Judgment and thought content normal.   Lab Results  Component Value Date   WBC 4.6 06/29/2012   HGB 13.1 06/29/2012  HCT 39.8 06/29/2012   PLT 248.0 06/29/2012   GLUCOSE 94 06/29/2012   CHOL 189 06/29/2012   TRIG 239.0* 06/29/2012   HDL 37.80* 06/29/2012   LDLDIRECT 108.0 06/29/2012   LDLCALC 108* 05/27/2010   ALT 12 06/29/2012   AST 17 06/29/2012   NA 139 06/29/2012   K 4.3 06/29/2012   CL 103 06/29/2012   CREATININE 1.3* 06/29/2012   BUN 20 06/29/2012   CO2 25 06/29/2012   TSH 2.28 06/29/2012   INR 1.03 01/11/2012   HGBA1C  5.7* 01/11/2012    X-ray Chest Pa And Lateral  01/11/2012   *RADIOLOGY REPORT*  Clinical Data: Syncope.  History of hypertension, CABG.  CHEST - 2 VIEW  Comparison: CT of the chest 08/31/2006, chest x-ray 06/22/2008 and earlier  Findings: The patient has had median sternotomy and valve replacement.  Mediastinal width appears stable, consistent with known thoracic ectasia/aneurysm.  There is no focal consolidation. There are no pleural effusions.  Degenerative changes are seen in the spine.  IMPRESSION: Stable appearance of the chest.   Original Report Authenticated By: Glenice Bow, M.D.       Assessment & Plan:   AWV/v70.0 - Today patient counseled on age appropriate routine health concerns for screening and prevention, each reviewed and up to date or declined. Immunizations reviewed and up to date or declined. Lab ordered and reviewed. Risk factors for depression reviewed and negative. Hearing function and visual acuity are intact. ADLs screened and addressed as needed. Functional ability and level of safety reviewed and appropriate. Education, counseling and referrals performed based on assessed risks today. Patient provided with a copy of personalized plan for preventive services.  Problem List Items Addressed This Visit   CORONARY ARTERY DISEASE     Remote CABG Also TAA repair with St Jude bioprosthetic AVR 2003 No anginal symptoms or cardiac complaints Medical management as ongoing, followup with cardiology and EP annually/as needed     Relevant Orders      Basic metabolic panel      CBC with Differential      Hepatic function panel      Lipid panel      TSH   HYPERLIPIDEMIA     On statin for CAD hx Check annually and titrate as needed    Relevant Orders      Basic metabolic panel      CBC with Differential      Hepatic function panel      Lipid panel      TSH   HYPERTENSION      BP Readings from Last 3 Encounters:  08/31/13 132/70  02/15/13 128/86  11/17/12  114/68   The current medical regimen is effective;  continue present plan and medications.     Relevant Orders      Basic metabolic panel      CBC with Differential      Hepatic function panel      Lipid panel      TSH   Pacemaker-St.Jude     Complete heart block with subsequent pacemaker October 2013 Follows with EP for same     Other Visit Diagnoses   Routine general medical examination at a health care facility    -  Primary

## 2013-09-01 ENCOUNTER — Telehealth: Payer: Self-pay | Admitting: Internal Medicine

## 2013-09-01 NOTE — Telephone Encounter (Signed)
Relevant patient education mailed to patient.  

## 2013-09-06 ENCOUNTER — Encounter: Payer: Self-pay | Admitting: Cardiology

## 2013-09-13 ENCOUNTER — Encounter: Payer: Medicare Other | Admitting: Internal Medicine

## 2013-09-19 ENCOUNTER — Encounter: Payer: Self-pay | Admitting: Cardiology

## 2013-09-28 ENCOUNTER — Encounter: Payer: Self-pay | Admitting: Internal Medicine

## 2013-10-26 ENCOUNTER — Other Ambulatory Visit: Payer: Self-pay | Admitting: Pulmonary Disease

## 2013-10-27 ENCOUNTER — Other Ambulatory Visit: Payer: Self-pay

## 2013-10-27 MED ORDER — FOLIC ACID 1 MG PO TABS: ORAL_TABLET | ORAL | Status: DC

## 2013-11-15 ENCOUNTER — Encounter: Payer: Self-pay | Admitting: Physician Assistant

## 2013-11-15 ENCOUNTER — Ambulatory Visit (INDEPENDENT_AMBULATORY_CARE_PROVIDER_SITE_OTHER): Payer: Medicare Other | Admitting: Physician Assistant

## 2013-11-15 VITALS — BP 110/70 | HR 76 | Ht 63.0 in | Wt 168.0 lb

## 2013-11-15 DIAGNOSIS — E785 Hyperlipidemia, unspecified: Secondary | ICD-10-CM

## 2013-11-15 DIAGNOSIS — I38 Endocarditis, valve unspecified: Secondary | ICD-10-CM | POA: Diagnosis not present

## 2013-11-15 DIAGNOSIS — I441 Atrioventricular block, second degree: Secondary | ICD-10-CM

## 2013-11-15 DIAGNOSIS — I10 Essential (primary) hypertension: Secondary | ICD-10-CM

## 2013-11-15 DIAGNOSIS — I2510 Atherosclerotic heart disease of native coronary artery without angina pectoris: Secondary | ICD-10-CM

## 2013-11-15 NOTE — Assessment & Plan Note (Addendum)
Stable.  No angina  

## 2013-11-15 NOTE — Assessment & Plan Note (Signed)
Well controlled 

## 2013-11-15 NOTE — Assessment & Plan Note (Signed)
Dr. Rayann Heman follows pacer.

## 2013-11-15 NOTE — Assessment & Plan Note (Addendum)
On pravastatin.  No change in therapy Last lipid panel: 08/2013:   Lipid Panel     Component Value Date/Time   CHOL 177 08/31/2013 1041   TRIG 161.0* 08/31/2013 1041   HDL 47.30 08/31/2013 1041   CHOLHDL 4 08/31/2013 1041   VLDL 32.2 08/31/2013 1041   LDLCALC 98 08/31/2013 1041

## 2013-11-15 NOTE — Patient Instructions (Signed)
Follow up with Dr. Stanford Breed in one year and with Dr. Rayann Heman as scheduled.

## 2013-11-15 NOTE — Progress Notes (Signed)
Date:  11/15/2013   ID:  DACIA CAPERS, DOB January 19, 1924, MRN [id number]  PCP:  Gwendolyn Grant, MD  Primary Cardiologist:  Stanford Breed    History of Present Illness:  AHSLEY ATTWOOD is a 80 y.o. female who has a history of coronary disease status post bypassing graft, history of thoracic aortic aneurysm repair, as well as aortic valve replacement and paroxysmal atrial fibrillation. Last Myoview was performed on September 02, 2006. At that time she had normal perfusion with an ejection fraction of 76%.  Her last echo was 06/28/12 with an EF of 55-65% with moderate LVH. Carotid Dopplers performed in Sept 2011 revealed normal carotids. Note she has also had problems with dementia with psychotic features. Last echocardiogram in April of 2013 showed normal LV function, mild left ventricular hypertrophy and grade 1 diastolic dysfunction. There was a bioprosthetic aortic valve with a mean gradient of 18 mm mercury. There was mild left atrial enlargement and mild mitral regurgitation. The patient was found to have symptomatic high degree AV block in October of 2013 and had a pacemaker placed.    The patient presents today for her annual evaluation.  She reports doing well. She currently denies nausea, vomiting, fever, chest pain, shortness of breath, orthopnea, dizziness, PND, cough, congestion, abdominal pain, hematochezia, melena, lower extremity edema, claudication.  Wt Readings from Last 3 Encounters:  11/15/13 168 lb (76.204 kg)  08/31/13 171 lb 6.4 oz (77.747 kg)  02/15/13 177 lb (80.287 kg)     Past Medical History  Diagnosis Date  . Unspecified chronic bronchitis   . HTN (hypertension)   . CAD (coronary artery disease)     s/p CABG  . S/P AVR (aortic valve replacement) 2003    bioprosthetic   . Atrial fibrillation     not anticoagulation candidate due to falls  . Carotid artery stenosis   . Hyperlipidemia   . Esophageal reflux   . Diverticulosis of colon (without mention of hemorrhage)   .  Osteoarthrosis, unspecified whether generalized or localized, unspecified site   . Osteoporosis, unspecified   . Dementia   . Anxiety state, unspecified   . Anemia, unspecified   . Complete heart block 12/2011    s/p St.Jude dual chamber PPM 01/12/12  . Thoracic aortic aneurysm 2003    s/p repair  . Pacemaker-St.Jude 01/13/2012    Current Outpatient Prescriptions  Medication Sig Dispense Refill  . acetaminophen (TYLENOL) 325 MG tablet Take 650 mg by mouth every 6 (six) hours as needed.      Marland Kitchen amLODipine (NORVASC) 5 MG tablet TAKE ONE TABLET BY MOUTH ONCE DAILY  90 tablet  1  . betamethasone dipropionate 0.05 % lotion APPLY  TO AFFECTED AREA TWICE DAILY  60 mL  0  . calcium carbonate (TUMS - DOSED IN MG ELEMENTAL CALCIUM) 500 MG chewable tablet Chew 2 tablets by mouth daily as needed.       . cholecalciferol (VITAMIN D) 1000 UNITS tablet Take 1,000 Units by mouth daily.      . citalopram (CELEXA) 20 MG tablet Take 20 mg by mouth daily.        . folic acid (FOLVITE) 1 MG tablet TAKE ONE TABLET BY MOUTH ONCE DAILY  30 tablet  4  . furosemide (LASIX) 40 MG tablet TAKE TWO TABLETS BY MOUTH IN THE MORNING.  60 tablet  4  . polyethylene glycol powder (MIRALAX) powder Take 17 g by mouth as needed.        Marland Kitchen  pravastatin (PRAVACHOL) 40 MG tablet TAKE ONE TABLET BY MOUTH AT BEDTIME  30 tablet  4  . risperiDONE (RISPERDAL) 0.25 MG tablet Take 1 tablet by mouth at bedtime.      . senna-docusate (SENOKOT-S) 8.6-50 MG per tablet Take 2 tablets by mouth daily as needed.       . traMADol (ULTRAM) 50 MG tablet Take 1 tablet (50 mg total) by mouth 3 (three) times daily as needed.  90 tablet  5   No current facility-administered medications for this visit.    Allergies:    Allergies  Allergen Reactions  . Clarithromycin     REACTION: abd pain and nausea  . Coumadin [Warfarin Sodium] Other (See Comments)    hallucinations   . Iohexol      Desc: sob with ivp approx 20 yrs ago, pt was premedicated  with 13hr prep with no problems kdean, Onset Date: [id number]   . Penicillins     REACTION: abd pain and nausea  . Prednisone     REACTION: swelling and nausea  . Statins     REACTION: abd pain and dark stools  . Sulfonamide Derivatives     REACTION: abd pain and nausea    Social History:  The patient  reports that she has never smoked. She has never used smokeless tobacco. She reports that she does not drink alcohol or use illicit drugs.   Family history:   Family History  Problem Relation Age of Onset  . Diabetes Brother   . Diabetes Sister     ROS:  Please see the history of present illness.  All other systems reviewed and negative.   PHYSICAL EXAM: VS:  BP 110/70  Pulse 76  Ht 5\' 3"  (1.6 m)  Wt 168 lb (76.204 kg)  BMI 29.77 kg/m2 Well nourished, well developed, in no acute distress HEENT: Pupils are equal round react to light accommodation extraocular movements are intact.  Neck: no JVDNo cervical lymphadenopathy. Cardiac: Regular rate and rhythm with2/6 sys murmur, LSB.  No  rubs or gallops. Lungs:  clear to auscultation bilaterally, no wheezing, rhonchi or rales Abd: soft, nontender, positive bowel sounds all quadrants,  Ext: no lower extremity edema.  2+ radial and 1+dorsalis pedis pulses. 2+ PT Skin: warm and dry Neuro:  Grossly normal  EKG:  Paced 76   ASSESSMENT AND PLAN:  Problem List Items Addressed This Visit   HYPERLIPIDEMIA      On pravastatin.  No change in therapy Last lipid panel: 08/2013:   Lipid Panel     Component Value Date/Time   CHOL 177 08/31/2013 1041   TRIG 161.0* 08/31/2013 1041   HDL 47.30 08/31/2013 1041   CHOLHDL 4 08/31/2013 1041   VLDL 32.2 08/31/2013 1041   LDLCALC 98 08/31/2013 1041        HYPERTENSION     Well controlled    CORONARY ARTERY DISEASE     Stable. No angina.      VALVULAR HEART DISEASE     Stable.    Second degree Mobitz II AV block     Dr. Rayann Heman follows pacer.     Other Visit Diagnoses   CAD in native  artery    -  Primary    Relevant Orders       EKG 12-Lead        Sherilee Smotherman , PAC

## 2013-11-15 NOTE — Assessment & Plan Note (Signed)
Stable

## 2013-11-21 ENCOUNTER — Encounter: Payer: Medicare Other | Admitting: Cardiology

## 2013-11-22 ENCOUNTER — Encounter: Payer: Self-pay | Admitting: Internal Medicine

## 2013-11-22 ENCOUNTER — Ambulatory Visit (INDEPENDENT_AMBULATORY_CARE_PROVIDER_SITE_OTHER): Payer: Medicare Other | Admitting: Internal Medicine

## 2013-11-22 VITALS — BP 110/70 | HR 88 | Ht 62.0 in | Wt 168.1 lb

## 2013-11-22 DIAGNOSIS — I4891 Unspecified atrial fibrillation: Secondary | ICD-10-CM

## 2013-11-22 DIAGNOSIS — I480 Paroxysmal atrial fibrillation: Secondary | ICD-10-CM

## 2013-11-22 DIAGNOSIS — I2510 Atherosclerotic heart disease of native coronary artery without angina pectoris: Secondary | ICD-10-CM

## 2013-11-22 DIAGNOSIS — I10 Essential (primary) hypertension: Secondary | ICD-10-CM | POA: Diagnosis not present

## 2013-11-22 DIAGNOSIS — I441 Atrioventricular block, second degree: Secondary | ICD-10-CM | POA: Diagnosis not present

## 2013-11-22 LAB — MDC_IDC_ENUM_SESS_TYPE_INCLINIC
Brady Statistic RA Percent Paced: 4.9 %
Brady Statistic RV Percent Paced: 91 %
Implantable Pulse Generator Model: 2210
Implantable Pulse Generator Serial Number: 7393244
Lead Channel Impedance Value: 610 Ohm
Lead Channel Impedance Value: 730 Ohm
Lead Channel Sensing Intrinsic Amplitude: 12 mV
Lead Channel Sensing Intrinsic Amplitude: 5 mV
Lead Channel Setting Pacing Amplitude: 1.25 V
Lead Channel Setting Sensing Sensitivity: 2 mV
MDC IDC MSMT LEADCHNL RA PACING THRESHOLD AMPLITUDE: 0.375 V
MDC IDC MSMT LEADCHNL RA PACING THRESHOLD PULSEWIDTH: 0.4 ms
MDC IDC MSMT LEADCHNL RV PACING THRESHOLD AMPLITUDE: 0.625 V
MDC IDC MSMT LEADCHNL RV PACING THRESHOLD PULSEWIDTH: 0.4 ms
MDC IDC SET LEADCHNL RV PACING AMPLITUDE: 0.875
MDC IDC SET LEADCHNL RV PACING PULSEWIDTH: 0.4 ms

## 2013-11-22 NOTE — Patient Instructions (Signed)
Your physician wants you to follow-up in: 12 months with Dr. Rayann Heman. You will receive a reminder letter in the mail two months in advance. If you don't receive a letter, please call our office to schedule the follow-up appointment.  Remote monitoring is used to monitor your Pacemaker of ICD from home. This monitoring reduces the number of office visits required to check your device to one time per year. It allows [place] to keep an eye on the functioning of your device to ensure it is working properly. You are scheduled for a device check from home on 02/22/2014. You may send your transmission at any time that day. If you have a wireless device, the transmission will be sent automatically. After your physician reviews your transmission, you will receive a postcard with your next transmission date.

## 2013-11-22 NOTE — Progress Notes (Signed)
PCP: Gwendolyn Grant, MD Primary Cardiologist:  Dr Breck Coons is a 80 y.o. female who presents today for routine electrophysiology followup.  Since having her pacemaker implanted, the patient reports doing very well.  Today, she denies symptoms of palpitations, chest pain, shortness of breath,  lower extremity edema, dizziness, presyncope, or syncope.  The patient is otherwise without complaint today.   Past Medical History  Diagnosis Date  . Unspecified chronic bronchitis   . HTN (hypertension)   . CAD (coronary artery disease)     s/p CABG  . S/P AVR (aortic valve replacement) 2003    bioprosthetic   . Atrial fibrillation     not anticoagulation candidate due to falls  . Carotid artery stenosis   . Hyperlipidemia   . Esophageal reflux   . Diverticulosis of colon (without mention of hemorrhage)   . Osteoarthrosis, unspecified whether generalized or localized, unspecified site   . Osteoporosis, unspecified   . Dementia   . Anxiety state, unspecified   . Anemia, unspecified   . Complete heart block 12/2011    s/p St.Jude dual chamber PPM 01/12/12  . Thoracic aortic aneurysm 2003    s/p repair  . Pacemaker-St.Jude 01/13/2012   Past Surgical History  Procedure Laterality Date  . Total hip arthroplasty  1998    left  . Vesicovaginal fistula closure w/ tah    . Intraocular lens insertion      bilateral  . Coronary artery bypass graft    . Aortic valve replacement  2003  . Cataract extraction      bilateral  . Pacemaker insertion  01/12/12    SJM Accent DR RF implanted by Dr Rayann Heman for CHB    Current Outpatient Prescriptions  Medication Sig Dispense Refill  . acetaminophen (TYLENOL) 325 MG tablet Take 650 mg by mouth every 6 (six) hours as needed (pain).       Marland Kitchen amLODipine (NORVASC) 5 MG tablet TAKE ONE TABLET BY MOUTH ONCE DAILY  90 tablet  1  . betamethasone dipropionate 0.05 % lotion APPLY  TO AFFECTED AREA TWICE DAILY  60 mL  0  . calcium carbonate (TUMS -  DOSED IN MG ELEMENTAL CALCIUM) 500 MG chewable tablet Chew 2 tablets by mouth daily as needed for heartburn.       . cholecalciferol (VITAMIN D) 1000 UNITS tablet Take 1,000 Units by mouth daily.      . citalopram (CELEXA) 20 MG tablet Take 20 mg by mouth daily.        . folic acid (FOLVITE) 1 MG tablet TAKE ONE TABLET BY MOUTH ONCE DAILY  30 tablet  4  . furosemide (LASIX) 40 MG tablet TAKE TWO TABLETS BY MOUTH IN THE MORNING.  60 tablet  4  . polyethylene glycol powder (MIRALAX) powder Take 17 g by mouth as needed (constipation).       . pravastatin (PRAVACHOL) 40 MG tablet TAKE ONE TABLET BY MOUTH AT BEDTIME  30 tablet  4  . risperiDONE (RISPERDAL) 0.25 MG tablet Take 1 tablet by mouth at bedtime.      . senna-docusate (SENOKOT-S) 8.6-50 MG per tablet Take 2 tablets by mouth daily as needed.       . traMADol (ULTRAM) 50 MG tablet Take 50 mg by mouth 3 (three) times daily as needed (pain).       No current facility-administered medications for this visit.    Physical Exam: Filed Vitals:   11/22/13 1410  BP: 110/70  Pulse:  88  Height: 5\' 2"  (1.575 m)  Weight: 168 lb 1.9 oz (76.259 kg)    GEN- The patient is well appearing, alert and oriented x 3 today.   Head- normocephalic, atraumatic Eyes-  Sclera clear, conjunctiva pink Ears- hearing intact Oropharynx- clear Lungs- Clear to ausculation bilaterally, normal work of breathing Chest- pacemaker pocket is well healed Heart- Regular rate and rhythm, no murmurs, rubs or gallops, PMI not laterally displaced GI- soft, NT, ND, + BS Extremities- no clubbing, cyanosis, or edema  Pacemaker interrogation- reviewed in detail today,  See PACEART report  Assessment and Plan:  1. Mobitz II second degree AV block Normal pacemaker function See Pace Art report  2. HTN Stable No change required today  3. CAD No ischemic symptoms No changes today  4. Afib Not presently anticoagulated due to fall concerns.     Merlin Return to the  device clinic in 1 year

## 2013-11-29 ENCOUNTER — Encounter: Payer: Self-pay | Admitting: Internal Medicine

## 2013-12-12 ENCOUNTER — Ambulatory Visit (INDEPENDENT_AMBULATORY_CARE_PROVIDER_SITE_OTHER): Payer: Medicare Other | Admitting: *Deleted

## 2013-12-12 DIAGNOSIS — Z23 Encounter for immunization: Secondary | ICD-10-CM

## 2013-12-26 ENCOUNTER — Other Ambulatory Visit: Payer: Self-pay | Admitting: Pulmonary Disease

## 2013-12-28 ENCOUNTER — Telehealth: Payer: Self-pay | Admitting: Internal Medicine

## 2013-12-28 ENCOUNTER — Other Ambulatory Visit: Payer: Self-pay

## 2013-12-28 MED ORDER — AMLODIPINE BESYLATE 5 MG PO TABS: ORAL_TABLET | ORAL | Status: DC

## 2013-12-28 MED ORDER — PRAVASTATIN SODIUM 40 MG PO TABS: ORAL_TABLET | ORAL | Status: DC

## 2013-12-28 NOTE — Telephone Encounter (Signed)
erx done

## 2013-12-28 NOTE — Telephone Encounter (Signed)
Pt request refill for Norvasc and Pravachol to be send to [facility] on [address]. Please help, drug store request twice and never heard from our office

## 2014-01-16 DIAGNOSIS — F329 Major depressive disorder, single episode, unspecified: Secondary | ICD-10-CM | POA: Diagnosis not present

## 2014-02-22 ENCOUNTER — Ambulatory Visit (INDEPENDENT_AMBULATORY_CARE_PROVIDER_SITE_OTHER): Payer: Medicare Other | Admitting: *Deleted

## 2014-02-22 ENCOUNTER — Encounter: Payer: Self-pay | Admitting: Internal Medicine

## 2014-02-22 DIAGNOSIS — I441 Atrioventricular block, second degree: Secondary | ICD-10-CM

## 2014-02-23 NOTE — Progress Notes (Signed)
Remote pacemaker transmission.   

## 2014-03-01 ENCOUNTER — Encounter (HOSPITAL_COMMUNITY): Payer: Self-pay | Admitting: Internal Medicine

## 2014-03-04 LAB — MDC_IDC_ENUM_SESS_TYPE_REMOTE
Battery Remaining Longevity: 92 mo
Battery Remaining Percentage: 67 %
Battery Voltage: 2.93 V
Date Time Interrogation Session: 20151203082644
Implantable Pulse Generator Serial Number: 7393244
Lead Channel Impedance Value: 560 Ohm
Lead Channel Pacing Threshold Pulse Width: 0.4 ms
Lead Channel Sensing Intrinsic Amplitude: 5 mV
Lead Channel Setting Pacing Amplitude: 0.875
Lead Channel Setting Pacing Pulse Width: 0.4 ms
MDC IDC MSMT LEADCHNL RA PACING THRESHOLD AMPLITUDE: 0.375 V
MDC IDC MSMT LEADCHNL RA PACING THRESHOLD PULSEWIDTH: 0.4 ms
MDC IDC MSMT LEADCHNL RV IMPEDANCE VALUE: 650 Ohm
MDC IDC MSMT LEADCHNL RV PACING THRESHOLD AMPLITUDE: 0.625 V
MDC IDC MSMT LEADCHNL RV SENSING INTR AMPL: 8.7 mV
MDC IDC SET LEADCHNL RA PACING AMPLITUDE: 1.375
MDC IDC SET LEADCHNL RV SENSING SENSITIVITY: 2 mV
MDC IDC STAT BRADY AP VP PERCENT: 14 %
MDC IDC STAT BRADY AP VS PERCENT: 1 %
MDC IDC STAT BRADY AS VP PERCENT: 77 %
MDC IDC STAT BRADY AS VS PERCENT: 5.3 %
MDC IDC STAT BRADY RA PERCENT PACED: 13 %
MDC IDC STAT BRADY RV PERCENT PACED: 92 %

## 2014-03-05 ENCOUNTER — Ambulatory Visit (INDEPENDENT_AMBULATORY_CARE_PROVIDER_SITE_OTHER): Payer: Medicare Other | Admitting: Internal Medicine

## 2014-03-05 ENCOUNTER — Encounter: Payer: Self-pay | Admitting: Internal Medicine

## 2014-03-05 ENCOUNTER — Ambulatory Visit: Payer: Medicare Other | Admitting: Internal Medicine

## 2014-03-05 VITALS — BP 146/80 | HR 72 | Temp 97.9°F | Ht 62.0 in | Wt 168.5 lb

## 2014-03-05 DIAGNOSIS — I480 Paroxysmal atrial fibrillation: Secondary | ICD-10-CM | POA: Diagnosis not present

## 2014-03-05 DIAGNOSIS — Z23 Encounter for immunization: Secondary | ICD-10-CM | POA: Diagnosis not present

## 2014-03-05 DIAGNOSIS — I2510 Atherosclerotic heart disease of native coronary artery without angina pectoris: Secondary | ICD-10-CM | POA: Diagnosis not present

## 2014-03-05 DIAGNOSIS — E785 Hyperlipidemia, unspecified: Secondary | ICD-10-CM | POA: Diagnosis not present

## 2014-03-05 DIAGNOSIS — I10 Essential (primary) hypertension: Secondary | ICD-10-CM | POA: Diagnosis not present

## 2014-03-05 NOTE — Progress Notes (Signed)
Subjective:    Patient ID: [name], female    DOB: [birth year], 80 y.o.   MRN: [id number]  HPI  Patient is here for follow up  Reviewed chronic medical issues and interval medical events  Past Medical History  Diagnosis Date  . Unspecified chronic bronchitis   . HTN (hypertension)   . CAD (coronary artery disease)     s/p CABG  . S/P AVR (aortic valve replacement) 2003    bioprosthetic   . Atrial fibrillation     not anticoagulation candidate due to falls  . Carotid artery stenosis   . Hyperlipidemia   . Esophageal reflux   . Diverticulosis of colon (without mention of hemorrhage)   . Osteoarthrosis, unspecified whether generalized or localized, unspecified site   . Osteoporosis, unspecified   . Dementia   . Anxiety state, unspecified   . Anemia, unspecified   . Complete heart block 12/2011    s/p St.Jude dual chamber PPM 01/12/12  . Thoracic aortic aneurysm 2003    s/p repair  . Pacemaker-St.Jude 01/13/2012    Review of Systems  Constitutional: Negative for fatigue and unexpected weight change.  Respiratory: Negative for cough and shortness of breath.   Cardiovascular: Negative for chest pain and leg swelling.       Objective:   Physical Exam  BP 146/80 mmHg  Pulse 72  Temp(Src) 97.9 F (36.6 C) (Oral)  Ht 5\' 2"  (1.575 m)  Wt 168 lb 8 oz (76.431 kg)  BMI 30.81 kg/m2  SpO2 95% Wt Readings from Last 3 Encounters:  03/05/14 168 lb 8 oz (76.431 kg)  11/22/13 168 lb 1.9 oz (76.259 kg)  11/15/13 168 lb (76.204 kg)   Constitutional: She appears well-developed and well-nourished. No distress. spouse at side Neck: Normal range of motion. Neck supple. No JVD present. No thyromegaly present.  Cardiovascular: Normal rate, regular rhythm and normal heart sounds.  No murmur heard. trace BLE edema. Pulmonary/Chest: Effort normal and breath sounds normal. No respiratory distress. She has no wheezes.  Psychiatric: She has a normal mood and affect. Her behavior is  normal. Judgment and thought content normal.   Lab Results  Component Value Date   WBC 7.7 08/31/2013   HGB 13.2 08/31/2013   HCT 39.8 08/31/2013   PLT 232.0 08/31/2013   GLUCOSE 86 08/31/2013   CHOL 177 08/31/2013   TRIG 161.0* 08/31/2013   HDL 47.30 08/31/2013   LDLDIRECT 108.0 06/29/2012   LDLCALC 98 08/31/2013   ALT 7 08/31/2013   AST 17 08/31/2013   NA 139 08/31/2013   K 4.1 08/31/2013   CL 102 08/31/2013   CREATININE 1.3* 08/31/2013   BUN 30* 08/31/2013   CO2 28 08/31/2013   TSH 1.24 08/31/2013   INR 1.03 01/11/2012   HGBA1C 5.7* 01/11/2012    X-ray Chest Pa And Lateral  01/11/2012   *RADIOLOGY REPORT*  Clinical Data: Syncope.  History of hypertension, CABG.  CHEST - 2 VIEW  Comparison: CT of the chest 08/31/2006, chest x-ray 06/22/2008 and earlier  Findings: The patient has had median sternotomy and valve replacement.  Mediastinal width appears stable, consistent with known thoracic ectasia/aneurysm.  There is no focal consolidation. There are no pleural effusions.  Degenerative changes are seen in the spine.  IMPRESSION: Stable appearance of the chest.   Original Report Authenticated By: Glenice Bow, M.D.       Assessment & Plan:   Problem List Items Addressed This Visit    Essential hypertension -  Primary    BP Readings from Last 3 Encounters:  03/05/14 146/80  11/22/13 110/70  11/15/13 110/70   The current medical regimen is effective;  continue present plan and medications.    Hyperlipidemia    On statin for CAD hx Check annually and titrate as needed    Paroxysmal atrial fibrillation    Not anticoag canidate Continue ASA, rate control

## 2014-03-05 NOTE — Assessment & Plan Note (Signed)
BP Readings from Last 3 Encounters:  03/05/14 146/80  11/22/13 110/70  11/15/13 110/70   The current medical regimen is effective;  continue present plan and medications.

## 2014-03-05 NOTE — Patient Instructions (Signed)
It was good to see you today.  We have reviewed your prior records including labs and tests today  Prevnar 13 (second pneumonia vaccine) updated today  Medications reviewed and updated, no changes recommended at this time. Refill on medication(s) as discussed today.  Please schedule followup in 6 months, call sooner if problems.

## 2014-03-05 NOTE — Assessment & Plan Note (Signed)
On statin for CAD hx Check annually and titrate as needed 

## 2014-03-05 NOTE — Assessment & Plan Note (Signed)
Not anticoag canidate Continue ASA, rate control

## 2014-03-05 NOTE — Progress Notes (Signed)
Pre visit review using our clinic review tool, if applicable. No additional management support is needed unless otherwise documented below in the visit note. 

## 2014-03-09 ENCOUNTER — Emergency Department (INDEPENDENT_AMBULATORY_CARE_PROVIDER_SITE_OTHER)
Admission: EM | Admit: 2014-03-09 | Discharge: 2014-03-09 | Disposition: A | Discharge status: Home / Self Care | Payer: Medicare Other | Source: Home / Self Care | Attending: Family Medicine | Admitting: Family Medicine

## 2014-03-09 ENCOUNTER — Telehealth: Payer: Self-pay | Admitting: *Deleted

## 2014-03-09 ENCOUNTER — Encounter (HOSPITAL_COMMUNITY): Payer: Self-pay | Admitting: *Deleted

## 2014-03-09 DIAGNOSIS — K529 Noninfective gastroenteritis and colitis, unspecified: Secondary | ICD-10-CM | POA: Diagnosis not present

## 2014-03-09 NOTE — ED Notes (Signed)
2nd day of nausea and diarrhea.  She denies fever or vomiting.  Last BM was this this morning and was loose.  Her appetite is decreased, she just had a piece of toast today and is taking fluids well.

## 2014-03-09 NOTE — Telephone Encounter (Signed)
Can try immodium over the counter.

## 2014-03-09 NOTE — Telephone Encounter (Signed)
Called pt husband stated he went ahead and took wife to [facility] they are there now...Johny Chess

## 2014-03-09 NOTE — Discharge Instructions (Signed)
Try Imodium, an over the counter medicine for diarrhea. Follow the instructions on the package.  Food Choices to Help Relieve Diarrhea When you have diarrhea, the foods you eat and your eating habits are very important. Choosing the right foods and drinks can help relieve diarrhea. Also, because diarrhea can last up to 7 days, you need to replace lost fluids and electrolytes (such as sodium, potassium, and chloride) in order to help prevent dehydration.  WHAT GENERAL GUIDELINES DO I NEED TO FOLLOW?  Slowly drink 1 cup (8 oz) of fluid for each episode of diarrhea. If you are getting enough fluid, your urine will be clear or pale yellow.  Eat starchy foods. Some good choices include white rice, white toast, pasta, low-fiber cereal, baked potatoes (without the skin), saltine crackers, and bagels.  Avoid large servings of any cooked vegetables.  Limit fruit to two servings per day. A serving is  cup or 1 small piece.  Choose foods with less than 2 g of fiber per serving.  Limit fats to less than 8 tsp (38 g) per day.  Avoid fried foods.  Eat foods that have probiotics in them. Probiotics can be found in certain dairy products.  Avoid foods and beverages that may increase the speed at which food moves through the stomach and intestines (gastrointestinal tract). Things to avoid include:  High-fiber foods, such as dried fruit, raw fruits and vegetables, nuts, seeds, and whole grain foods.  Spicy foods and high-fat foods.  Foods and beverages sweetened with high-fructose corn syrup, honey, or sugar alcohols such as xylitol, sorbitol, and mannitol. WHAT FOODS ARE RECOMMENDED? Grains White rice. White, [place], or pita breads (fresh or toasted), including plain rolls, buns, or bagels. White pasta. Saltine, soda, or graham crackers. Pretzels. Low-fiber cereal. Cooked cereals made with water (such as cornmeal, farina, or cream cereals). Plain muffins. Matzo. Melba toast. Zwieback.   Vegetables Potatoes (without the skin). Strained tomato and vegetable juices. Most well-cooked and canned vegetables without seeds. Tender lettuce. Fruits Cooked or canned applesauce, apricots, cherries, fruit cocktail, grapefruit, peaches, pears, or plums. Fresh bananas, apples without skin, cherries, grapes, cantaloupe, grapefruit, peaches, oranges, or plums.  Meat and Other Protein Products Baked or boiled chicken. Eggs. Tofu. Fish. Seafood. Smooth peanut butter. Ground or well-cooked tender beef, ham, veal, lamb, pork, or poultry.  Dairy Plain yogurt, kefir, and unsweetened liquid yogurt. Lactose-free milk, buttermilk, or soy milk. Plain hard cheese. Beverages Sport drinks. Clear broths. Diluted fruit juices (except prune). Regular, caffeine-free sodas such as ginger ale. Water. Decaffeinated teas. Oral rehydration solutions. Sugar-free beverages not sweetened with sugar alcohols. Other Bouillon, broth, or soups made from recommended foods.  The items listed above may not be a complete list of recommended foods or beverages. Contact your dietitian for more options. WHAT FOODS ARE NOT RECOMMENDED? Grains Whole grain, whole wheat, bran, or rye breads, rolls, pastas, crackers, and cereals. Wild or brown rice. Cereals that contain more than 2 g of fiber per serving. Corn tortillas or taco shells. Cooked or dry oatmeal. Granola. Popcorn. Vegetables Raw vegetables. Cabbage, broccoli, Brussels sprouts, artichokes, baked beans, beet greens, corn, kale, legumes, peas, sweet potatoes, and yams. Potato skins. Cooked spinach and cabbage. Fruits Dried fruit, including raisins and dates. Raw fruits. Stewed or dried prunes. Fresh apples with skin, apricots, mangoes, pears, raspberries, and strawberries.  Meat and Other Protein Products Chunky peanut butter. Nuts and seeds. Beans and lentils. Berniece Salines.  Dairy High-fat cheeses. Milk, chocolate milk, and beverages made with milk, such  as milk shakes. Cream.  Ice cream. Sweets and Desserts Sweet rolls, doughnuts, and sweet breads. Pancakes and waffles. Fats and Oils Butter. Cream sauces. Margarine. Salad oils. Plain salad dressings. Olives. Avocados.  Beverages Caffeinated beverages (such as coffee, tea, soda, or energy drinks). Alcoholic beverages. Fruit juices with pulp. Prune juice. Soft drinks sweetened with high-fructose corn syrup or sugar alcohols. Other Coconut. Hot sauce. Chili powder. Mayonnaise. Gravy. Cream-based or milk-based soups.  The items listed above may not be a complete list of foods and beverages to avoid. Contact your dietitian for more information. WHAT SHOULD I DO IF I BECOME DEHYDRATED? Diarrhea can sometimes lead to dehydration. Signs of dehydration include dark urine and dry mouth and skin. If you think you are dehydrated, you should rehydrate with an oral rehydration solution. These solutions can be purchased at pharmacies, retail stores, or online.  Drink -1 cup (120-240 mL) of oral rehydration solution each time you have an episode of diarrhea. If drinking this amount makes your diarrhea worse, try drinking smaller amounts more often. For example, drink 1-3 tsp (5-15 mL) every 5-10 minutes.  A general rule for staying hydrated is to drink 1-2 L of fluid per day. Talk to your health care provider about the specific amount you should be drinking each day. Drink enough fluids to keep your urine clear or pale yellow. Document Released: 05/30/2003 Document Revised: 03/14/2013 Document Reviewed: 01/30/2013 [facility] Patient Information 2015 [place], [place]. This information is not intended to replace advice given to you by your health care provider. Make sure you discuss any questions you have with your health care provider.

## 2014-03-09 NOTE — ED Provider Notes (Signed)
CSN: [id number]     Arrival date & time 03/09/14  1101 History   First MD Initiated Contact with Patient 03/09/14 1218     Chief Complaint  Patient presents with  . Diarrhea   (Consider location/radiation/quality/duration/timing/severity/associated sxs/prior Treatment) HPI Comments: Pt here with husband who has similar sx. Watery diarrhea for 2 days, last episode this morning. Husband thinks sx are improving. Pt had coffee and cereal this morning. No vomiting.   Patient is a 80 y.o. female presenting with diarrhea. The history is provided by the patient and the spouse.  Diarrhea Quality:  Watery Severity:  Moderate Onset quality:  Sudden Number of episodes:  Multiple Duration:  2 days Timing:  Intermittent Progression:  Improving Relieved by:  None tried Worsened by:  Nothing tried Ineffective treatments:  None tried Associated symptoms: no abdominal pain, no chills, no fever and no vomiting     Past Medical History  Diagnosis Date  . Unspecified chronic bronchitis   . HTN (hypertension)   . CAD (coronary artery disease)     s/p CABG  . S/P AVR (aortic valve replacement) 2003    bioprosthetic   . Atrial fibrillation     not anticoagulation candidate due to falls  . Carotid artery stenosis   . Hyperlipidemia   . Esophageal reflux   . Diverticulosis of colon (without mention of hemorrhage)   . Osteoarthrosis, unspecified whether generalized or localized, unspecified site   . Osteoporosis, unspecified   . Dementia   . Anxiety state, unspecified   . Anemia, unspecified   . Complete heart block 12/2011    s/p St.Jude dual chamber PPM 01/12/12  . Thoracic aortic aneurysm 2003    s/p repair  . Pacemaker-St.Jude 01/13/2012   Past Surgical History  Procedure Laterality Date  . Total hip arthroplasty  1998    left  . Vesicovaginal fistula closure w/ tah    . Intraocular lens insertion      bilateral  . Coronary artery bypass graft    . Aortic valve replacement  2003   . Cataract extraction      bilateral  . Pacemaker insertion  01/12/12    SJM Accent DR RF implanted by Dr Rayann Heman for CHB  . Permanent pacemaker insertion N/A 01/12/2012    Procedure: PERMANENT PACEMAKER INSERTION;  Surgeon: Thompson Grayer, MD;  Location[facility] CATH LAB;  Service: Cardiovascular;  Laterality: N/A;   Family History  Problem Relation Age of Onset  . Diabetes Brother   . Diabetes Sister    History  Substance Use Topics  . Smoking status: Never Smoker   . Smokeless tobacco: Never Used  . Alcohol Use: No   OB History    No data available     Review of Systems  Constitutional: Negative for fever and chills.  Gastrointestinal: Positive for nausea and diarrhea. Negative for vomiting and abdominal pain.    Allergies  Coumadin; Clarithromycin; Iohexol; Nsaids; Penicillins; Prednisone; Statins; and Sulfonamide derivatives  Home Medications   Prior to Admission medications   Medication Sig Start Date End Date Taking? Authorizing Provider  acetaminophen (TYLENOL) 325 MG tablet Take 650 mg by mouth every 6 (six) hours as needed (pain).    Yes Historical Provider, MD  amLODipine (NORVASC) 5 MG tablet TAKE ONE TABLET BY MOUTH ONCE DAILY 12/28/13  Yes Rowe Clack, MD  betamethasone dipropionate 0.05 % lotion APPLY  TO AFFECTED AREA TWICE DAILY   Yes Noralee Space, MD  calcium carbonate (TUMS - DOSED  IN MG ELEMENTAL CALCIUM) 500 MG chewable tablet Chew 2 tablets by mouth daily as needed for heartburn.    Yes Historical Provider, MD  citalopram (CELEXA) 20 MG tablet Take 20 mg by mouth daily.     Yes Historical Provider, MD  folic acid (FOLVITE) 1 MG tablet TAKE ONE TABLET BY MOUTH ONCE DAILY 10/27/13  Yes Rowe Clack, MD  furosemide (LASIX) 40 MG tablet TAKE TWO TABLETS BY MOUTH IN THE MORNING. 06/28/13  Yes Noralee Space, MD  pravastatin (PRAVACHOL) 40 MG tablet TAKE ONE TABLET BY MOUTH AT BEDTIME 12/28/13  Yes Rowe Clack, MD  risperiDONE (RISPERDAL) 0.25 MG  tablet Take 1 tablet by mouth at bedtime.   Yes Historical Provider, MD  traMADol (ULTRAM) 50 MG tablet Take 50 mg by mouth 3 (three) times daily as needed (pain). 02/15/13  Yes Noralee Space, MD  cholecalciferol (VITAMIN D) 1000 UNITS tablet Take 1,000 Units by mouth daily.    Historical Provider, MD  polyethylene glycol powder (MIRALAX) powder Take 17 g by mouth as needed (constipation).     Historical Provider, MD  senna-docusate (SENOKOT-S) 8.6-50 MG per tablet Take 2 tablets by mouth daily as needed.     Historical Provider, MD   BP 106/65 mmHg  Pulse 69  Temp(Src) 97.7 F (36.5 C) (Oral)  Resp 14  SpO2 100% Physical Exam  Constitutional: She appears well-developed and well-nourished. No distress.  Cardiovascular: Normal rate.   Pulmonary/Chest: Effort normal and breath sounds normal.  Abdominal: Soft. She exhibits distension. Bowel sounds are increased. There is no tenderness.    ED Course  Procedures (including critical care time) Labs Review Labs Reviewed - No data to display  Imaging Review No results found.   MDM   1. Enteritis   Suggested imodium. Gave diet instructions.      Carvel Getting, NP 03/09/14 [place] Naquan Garman, NP 03/09/14 [phone]

## 2014-03-09 NOTE — Telephone Encounter (Signed)
Left msg on triage stating wife been having some loose stools x's 2 days. Denies fever, but aloting of cramping/discomfort. Requesting md to rx something for diarrhea. MD is not in office. Pls advise...[name]

## 2014-03-12 ENCOUNTER — Encounter: Payer: Self-pay | Admitting: Cardiology

## 2014-03-15 ENCOUNTER — Emergency Department (HOSPITAL_COMMUNITY): Payer: Medicare Other

## 2014-03-15 ENCOUNTER — Encounter (HOSPITAL_COMMUNITY): Payer: Self-pay | Admitting: Emergency Medicine

## 2014-03-15 ENCOUNTER — Emergency Department (HOSPITAL_COMMUNITY)
Admission: EM | Admit: 2014-03-15 | Discharge: 2014-03-15 | Disposition: A | Discharge status: Home / Self Care | Payer: Medicare Other | Attending: Emergency Medicine | Admitting: Emergency Medicine

## 2014-03-15 DIAGNOSIS — Z7982 Long term (current) use of aspirin: Secondary | ICD-10-CM | POA: Diagnosis not present

## 2014-03-15 DIAGNOSIS — Z9842 Cataract extraction status, left eye: Secondary | ICD-10-CM | POA: Insufficient documentation

## 2014-03-15 DIAGNOSIS — F419 Anxiety disorder, unspecified: Secondary | ICD-10-CM | POA: Insufficient documentation

## 2014-03-15 DIAGNOSIS — M1990 Unspecified osteoarthritis, unspecified site: Secondary | ICD-10-CM | POA: Insufficient documentation

## 2014-03-15 DIAGNOSIS — I4891 Unspecified atrial fibrillation: Secondary | ICD-10-CM | POA: Diagnosis not present

## 2014-03-15 DIAGNOSIS — F0390 Unspecified dementia without behavioral disturbance: Secondary | ICD-10-CM | POA: Diagnosis not present

## 2014-03-15 DIAGNOSIS — Z79899 Other long term (current) drug therapy: Secondary | ICD-10-CM | POA: Diagnosis not present

## 2014-03-15 DIAGNOSIS — M810 Age-related osteoporosis without current pathological fracture: Secondary | ICD-10-CM | POA: Diagnosis not present

## 2014-03-15 DIAGNOSIS — R1084 Generalized abdominal pain: Secondary | ICD-10-CM | POA: Diagnosis not present

## 2014-03-15 DIAGNOSIS — I10 Essential (primary) hypertension: Secondary | ICD-10-CM | POA: Insufficient documentation

## 2014-03-15 DIAGNOSIS — R197 Diarrhea, unspecified: Secondary | ICD-10-CM | POA: Diagnosis not present

## 2014-03-15 DIAGNOSIS — K5900 Constipation, unspecified: Secondary | ICD-10-CM | POA: Insufficient documentation

## 2014-03-15 DIAGNOSIS — D649 Anemia, unspecified: Secondary | ICD-10-CM | POA: Diagnosis not present

## 2014-03-15 DIAGNOSIS — I2510 Atherosclerotic heart disease of native coronary artery without angina pectoris: Secondary | ICD-10-CM | POA: Diagnosis not present

## 2014-03-15 DIAGNOSIS — Z951 Presence of aortocoronary bypass graft: Secondary | ICD-10-CM | POA: Diagnosis not present

## 2014-03-15 DIAGNOSIS — I712 Thoracic aortic aneurysm, without rupture: Secondary | ICD-10-CM | POA: Insufficient documentation

## 2014-03-15 DIAGNOSIS — Z880 Allergy status to penicillin: Secondary | ICD-10-CM | POA: Insufficient documentation

## 2014-03-15 DIAGNOSIS — R140 Abdominal distension (gaseous): Secondary | ICD-10-CM | POA: Diagnosis not present

## 2014-03-15 DIAGNOSIS — Z9841 Cataract extraction status, right eye: Secondary | ICD-10-CM | POA: Diagnosis not present

## 2014-03-15 MED ORDER — POLYETHYLENE GLYCOL 3350 17 G PO PACK: 17.0000 g | PACK | Freq: Every day | ORAL | Status: DC

## 2014-03-15 MED ORDER — FLEET ENEMA 7-19 GM/118ML RE ENEM
1.0000 | ENEMA | Freq: Once | RECTAL | Status: AC
  Administered 2014-03-15: 1 via RECTAL
  Filled 2014-03-15: qty 1

## 2014-03-15 NOTE — ED Notes (Signed)
MD Yao at bedside 

## 2014-03-15 NOTE — ED Notes (Signed)
MD Darl Householder performed stool disimpaction with  results. Patient states "I feel better"

## 2014-03-15 NOTE — ED Notes (Signed)
Pt transported to XRAY °

## 2014-03-15 NOTE — ED Notes (Signed)
Pt from home via [facility] c/o constipation x 1 week. Recent treatment for diarrhea with imodium. Abdominal distention present.

## 2014-03-15 NOTE — ED Notes (Signed)
Bed: TA68 Expected date:  Expected time:  Means of arrival:  Comments: EMS- elderly, constipation

## 2014-03-15 NOTE — ED Provider Notes (Signed)
CSN: [id number]     Arrival date & time 03/15/14  0908 History   First MD Initiated Contact with Patient 03/15/14 [id number]     Chief Complaint  Patient presents with  . Constipation     (Consider location/radiation/quality/duration/timing/severity/associated sxs/prior Treatment) The history is provided by the patient and the EMS personnel.  [name] is a 80 y.o. female history of dementia, A. fib not on Coumadin, hypertension, heart block with pacemaker, here presenting with constipation. She had some gastroenteritis symptoms about a week ago along with some diarrhea. Went to urgent care and told to take some Imodium. Has been taking Imodium and now hasn't been able to have a bowel movement for the last week. Also noticed that her abdomen has been more distended to the last several days and more painful. Denies any nausea vomiting or fevers. She still able to eat food. Hx of hysterectomy in the past.    Level V caveat- dementia   Past Medical History  Diagnosis Date  . Unspecified chronic bronchitis   . HTN (hypertension)   . CAD (coronary artery disease)     s/p CABG  . S/P AVR (aortic valve replacement) 2003    bioprosthetic   . Atrial fibrillation     not anticoagulation candidate due to falls  . Carotid artery stenosis   . Hyperlipidemia   . Esophageal reflux   . Diverticulosis of colon (without mention of hemorrhage)   . Osteoarthrosis, unspecified whether generalized or localized, unspecified site   . Osteoporosis, unspecified   . Dementia   . Anxiety state, unspecified   . Anemia, unspecified   . Complete heart block 12/2011    s/p St.Jude dual chamber PPM 01/12/12  . Thoracic aortic aneurysm 2003    s/p repair  . Pacemaker-St.Jude 01/13/2012   Past Surgical History  Procedure Laterality Date  . Total hip arthroplasty  1998    left  . Vesicovaginal fistula closure w/ tah    . Intraocular lens insertion      bilateral  . Coronary artery bypass graft    . Aortic  valve replacement  2003  . Cataract extraction      bilateral  . Pacemaker insertion  01/12/12    SJM Accent DR RF implanted by Dr Rayann Heman for CHB  . Permanent pacemaker insertion N/A 01/12/2012    Procedure: PERMANENT PACEMAKER INSERTION;  Surgeon: Thompson Grayer, MD;  Location: [facility] C[facility] LAB;  Service: Cardiovascular;  Laterality: N/A;   Family History  Problem Relation Age of Onset  . Diabetes Brother   . Diabetes Sister    History  Substance Use Topics  . Smoking status: Never Smoker   . Smokeless tobacco: Never Used  . Alcohol Use: No   OB History    No data available     Review of Systems  Gastrointestinal: Positive for abdominal pain, constipation and abdominal distention.  All other systems reviewed and are negative.     Allergies  Coumadin; Clarithromycin; Iohexol; Nsaids; Penicillins; Prednisone; Statins; and Sulfonamide derivatives  Home Medications   Prior to Admission medications   Medication Sig Start Date End Date Taking? Authorizing Provider  acetaminophen (TYLENOL) 325 MG tablet Take 650 mg by mouth every 6 (six) hours as needed (pain).    Yes Historical Provider, MD  amLODipine (NORVASC) 5 MG tablet TAKE ONE TABLET BY MOUTH ONCE DAILY Patient taking differently: Take 5 mg by mouth daily with breakfast.  12/28/13  Yes Rowe Clack, MD  aspirin 325  MG EC tablet Take 325 mg by mouth daily with breakfast.   Yes Historical Provider, MD  calcium carbonate (TUMS - DOSED IN MG ELEMENTAL CALCIUM) 500 MG chewable tablet Chew 2 tablets by mouth daily as needed for heartburn.    Yes Historical Provider, MD  cholecalciferol (VITAMIN D) 1000 UNITS tablet Take 1,000 Units by mouth daily with breakfast.    Yes Historical Provider, MD  citalopram (CELEXA) 20 MG tablet Take 20 mg by mouth daily at 6 PM.    Yes Historical Provider, MD  folic acid (FOLVITE) 1 MG tablet TAKE ONE TABLET BY MOUTH ONCE DAILY Patient taking differently: Take 1 mg by mouth daily with breakfast.   10/27/13  Yes Rowe Clack, MD  furosemide (LASIX) 40 MG tablet Take 40 mg by mouth 2 (two) times daily.   Yes Historical Provider, MD  mineral oil enema Place 1 enema rectally once.   Yes Historical Provider, MD  polyethylene glycol powder (MIRALAX) powder Take 17 g by mouth daily as needed (constipation).    Yes Historical Provider, MD  pravastatin (PRAVACHOL) 40 MG tablet TAKE ONE TABLET BY MOUTH AT BEDTIME Patient taking differently: Take 40 mg by mouth daily at 6 PM.  12/28/13  Yes Rowe Clack, MD  risperiDONE (RISPERDAL) 0.25 MG tablet Take 1 tablet by mouth at bedtime.   Yes Historical Provider, MD  senna-docusate (SENOKOT-S) 8.6-50 MG per tablet Take 2 tablets by mouth daily as needed for mild constipation.    Yes Historical Provider, MD  traMADol (ULTRAM) 50 MG tablet Take 50 mg by mouth 3 (three) times daily as needed (pain). 02/15/13  Yes Noralee Space, MD  betamethasone dipropionate 0.05 % lotion APPLY  TO AFFECTED AREA TWICE DAILY Patient not taking: Reported on 03/15/2014    Noralee Space, MD  furosemide (LASIX) 40 MG tablet TAKE TWO TABLETS BY MOUTH IN THE MORNING. Patient not taking: Reported on 03/15/2014 06/28/13   Noralee Space, MD   BP 158/64 mmHg  Pulse 62  Temp(Src) 97.7 F (36.5 C) (Oral)  Resp 18  SpO2 98% Physical Exam  Constitutional: She is oriented to person, place, and time.  Chronically ill, uncomfortable   HENT:  Head: Normocephalic.  Eyes: Conjunctivae are normal. Pupils are equal, round, and reactive to light.  Neck: Normal range of motion.  Cardiovascular: Normal rate, regular rhythm and normal heart sounds.   Pulmonary/Chest: Effort normal and breath sounds normal. No respiratory distress. She has no wheezes. She has no rales.  Abdominal: Soft. Bowel sounds are normal.  + distended, mild diffuse tenderness, worse in LLQ. No rebound   Genitourinary:  Rectal- stool impaction   Musculoskeletal: Normal range of motion. She exhibits no edema or  tenderness.  Neurological: She is alert and oriented to person, place, and time. No cranial nerve deficit. Coordination normal.  Skin: Skin is warm and dry.  Psychiatric: She has a normal mood and affect. Her behavior is normal. Judgment and thought content normal.  Nursing note and vitals reviewed.   ED Course  Fecal disimpaction Date/Time: 03/15/2014 9:23 AM Performed by: Wandra Arthurs Authorized by: Wandra Arthurs Consent: Verbal consent obtained. Risks and benefits: risks, benefits and alternatives were discussed Consent given by: patient Patient understanding: patient states understanding of the procedure being performed Patient consent: the patient's understanding of the procedure matches consent given Procedure consent: procedure consent matches procedure scheduled Relevant documents: relevant documents present and verified Test results: test results available and properly labeled  Patient identity confirmed: verbally with patient Local anesthesia used: no Patient sedated: no Patient tolerance: Patient tolerated the procedure well with no immediate complications   (including critical care time) Labs Review Labs Reviewed - No data to display  Imaging Review Dg Abd Acute W/chest  03/15/2014   CLINICAL DATA:  Constipation.  Distended abdomen  EXAM: ACUTE ABDOMEN SERIES (ABDOMEN 2 VIEW & CHEST 1 VIEW)  COMPARISON:  Chest 01/13/2012  FINDINGS: Aortic valve replacement. Dual lead pacemaker unchanged. Thoracic aortic aneurysm stable. No heart failure. Negative for infiltrate. Left basilar pleural scarring is unchanged.  Mild to moderate retained stool throughout the colon including the rectum. Negative for bowel obstruction or free air. Negative for renal calculi  Left hip replacement. Advanced osteoarthritis and probable chronic AVN of the right hip.  IMPRESSION: No active cardiopulmonary abnormality  Mild to moderate retained stool in the colon without bowel obstruction.    Electronically Signed   By: Franchot Gallo M.D.   On: 03/15/2014 10:03     EKG Interpretation None      MDM   Final diagnoses:  Constipation    [name] is a 80 y.o. female here with stool impaction. I disimpacted her. Will get xray to r/o SBO given previous surgeries.   11:51 AM  Xray showed no SBO, just moderate constipation. Had bowel movement afterwards. Now abdomen soft, nontender. Will d/c home with miralax.     Wandra Arthurs, MD 03/15/14 [phone]

## 2014-03-15 NOTE — ED Notes (Signed)
Pt had small amount of brown stool after enema.

## 2014-03-15 NOTE — Discharge Instructions (Signed)
Use miralax daily until you have normal bowel movements.   Stay hydrated.   Follow up with your doctor.   Return to ER if you have severe abdominal pain, worse constipation, fevers, vomiting.

## 2014-03-28 ENCOUNTER — Other Ambulatory Visit: Payer: Self-pay | Admitting: Internal Medicine

## 2014-05-10 DIAGNOSIS — H6123 Impacted cerumen, bilateral: Secondary | ICD-10-CM | POA: Diagnosis not present

## 2014-05-10 DIAGNOSIS — H903 Sensorineural hearing loss, bilateral: Secondary | ICD-10-CM | POA: Diagnosis not present

## 2014-05-24 ENCOUNTER — Encounter: Payer: Self-pay | Admitting: Internal Medicine

## 2014-05-24 ENCOUNTER — Ambulatory Visit (INDEPENDENT_AMBULATORY_CARE_PROVIDER_SITE_OTHER): Payer: Medicare Other | Admitting: *Deleted

## 2014-05-24 DIAGNOSIS — I441 Atrioventricular block, second degree: Secondary | ICD-10-CM

## 2014-05-24 LAB — MDC_IDC_ENUM_SESS_TYPE_REMOTE
Battery Remaining Longevity: 93 mo
Battery Remaining Percentage: 67 %
Brady Statistic AP VP Percent: 14 %
Brady Statistic AP VS Percent: 1.2 %
Brady Statistic AS VP Percent: 65 %
Brady Statistic AS VS Percent: 19 %
Brady Statistic RA Percent Paced: 13 %
Brady Statistic RV Percent Paced: 78 %
Date Time Interrogation Session: 20160303085849
Implantable Pulse Generator Model: 2210
Implantable Pulse Generator Serial Number: 7393244
Lead Channel Impedance Value: 540 Ohm
Lead Channel Pacing Threshold Amplitude: 0.625 V
Lead Channel Pacing Threshold Pulse Width: 0.4 ms
Lead Channel Pacing Threshold Pulse Width: 0.4 ms
Lead Channel Sensing Intrinsic Amplitude: 12 mV
Lead Channel Sensing Intrinsic Amplitude: 5 mV
Lead Channel Setting Pacing Amplitude: 0.875
Lead Channel Setting Pacing Amplitude: 1.25 V
Lead Channel Setting Pacing Pulse Width: 0.4 ms
MDC IDC MSMT BATTERY VOLTAGE: 2.93 V
MDC IDC MSMT LEADCHNL RA PACING THRESHOLD AMPLITUDE: 0.25 V
MDC IDC MSMT LEADCHNL RV IMPEDANCE VALUE: 680 Ohm
MDC IDC SET LEADCHNL RV SENSING SENSITIVITY: 2 mV

## 2014-05-24 NOTE — Progress Notes (Signed)
Remote pacemaker transmission.   

## 2014-05-27 ENCOUNTER — Other Ambulatory Visit: Payer: Self-pay | Admitting: Pulmonary Disease

## 2014-05-27 ENCOUNTER — Other Ambulatory Visit: Payer: Self-pay | Admitting: Internal Medicine

## 2014-05-28 ENCOUNTER — Other Ambulatory Visit: Payer: Self-pay | Admitting: Pulmonary Disease

## 2014-05-31 ENCOUNTER — Other Ambulatory Visit: Payer: Self-pay | Admitting: Pulmonary Disease

## 2014-06-01 ENCOUNTER — Telehealth: Payer: Self-pay | Admitting: Internal Medicine

## 2014-06-01 ENCOUNTER — Other Ambulatory Visit: Payer: Self-pay

## 2014-06-01 MED ORDER — FUROSEMIDE 40 MG PO TABS: 80.0000 mg | ORAL_TABLET | Freq: Every day | ORAL | Status: DC

## 2014-06-01 NOTE — Telephone Encounter (Signed)
States has been trying for a week to get furosemide filled for a week by Dr. Asa Lente.  Sounds like insurance is needed a PA from what I can understand.  Patient uses Paediatric nurse at [facility].

## 2014-06-01 NOTE — Telephone Encounter (Signed)
erx done pt informed

## 2014-06-04 ENCOUNTER — Encounter: Payer: Self-pay | Admitting: Cardiology

## 2014-06-11 ENCOUNTER — Telehealth: Payer: Self-pay | Admitting: Internal Medicine

## 2014-06-11 NOTE — Telephone Encounter (Signed)
Patient would like a list of al doctors on this site and on [place]. Please advise

## 2014-06-25 ENCOUNTER — Other Ambulatory Visit: Payer: Self-pay | Admitting: Internal Medicine

## 2014-07-25 ENCOUNTER — Other Ambulatory Visit: Payer: Self-pay | Admitting: Internal Medicine

## 2014-08-27 ENCOUNTER — Ambulatory Visit (INDEPENDENT_AMBULATORY_CARE_PROVIDER_SITE_OTHER): Payer: Medicare Other | Admitting: *Deleted

## 2014-08-27 DIAGNOSIS — I441 Atrioventricular block, second degree: Secondary | ICD-10-CM

## 2014-08-27 NOTE — Progress Notes (Signed)
Remote pacemaker transmission.   

## 2014-08-31 LAB — CUP PACEART REMOTE DEVICE CHECK
Battery Voltage: 2.93 V
Brady Statistic AS VP Percent: 71 %
Brady Statistic AS VS Percent: 14 %
Brady Statistic RA Percent Paced: 13 %
Date Time Interrogation Session: 20160606073244
Lead Channel Impedance Value: 630 Ohm
Lead Channel Pacing Threshold Amplitude: 0.375 V
Lead Channel Pacing Threshold Amplitude: 0.5 V
Lead Channel Pacing Threshold Pulse Width: 0.4 ms
Lead Channel Pacing Threshold Pulse Width: 0.4 ms
Lead Channel Sensing Intrinsic Amplitude: 5 mV
Lead Channel Setting Pacing Amplitude: 0.75 V
Lead Channel Setting Pacing Amplitude: 1.375
Lead Channel Setting Sensing Sensitivity: 2 mV
MDC IDC MSMT BATTERY REMAINING LONGEVITY: 91 mo
MDC IDC MSMT BATTERY REMAINING PERCENTAGE: 67 %
MDC IDC MSMT LEADCHNL RA IMPEDANCE VALUE: 460 Ohm
MDC IDC MSMT LEADCHNL RV SENSING INTR AMPL: 12 mV
MDC IDC PG SERIAL: 7393244
MDC IDC SET LEADCHNL RV PACING PULSEWIDTH: 0.4 ms
MDC IDC STAT BRADY AP VP PERCENT: 13 %
MDC IDC STAT BRADY AP VS PERCENT: 1.3 %
MDC IDC STAT BRADY RV PERCENT PACED: 84 %

## 2014-09-10 ENCOUNTER — Encounter: Payer: Self-pay | Admitting: Cardiology

## 2014-09-10 DIAGNOSIS — F329 Major depressive disorder, single episode, unspecified: Secondary | ICD-10-CM | POA: Diagnosis not present

## 2014-09-14 ENCOUNTER — Encounter: Payer: Self-pay | Admitting: Internal Medicine

## 2014-09-17 ENCOUNTER — Ambulatory Visit (INDEPENDENT_AMBULATORY_CARE_PROVIDER_SITE_OTHER): Payer: Medicare Other | Admitting: Internal Medicine

## 2014-09-17 ENCOUNTER — Ambulatory Visit (INDEPENDENT_AMBULATORY_CARE_PROVIDER_SITE_OTHER)
Admission: RE | Admit: 2014-09-17 | Discharge: 2014-09-17 | Disposition: A | Discharge status: Home / Self Care | Payer: Medicare Other | Source: Ambulatory Visit | Attending: Internal Medicine | Admitting: Internal Medicine

## 2014-09-17 ENCOUNTER — Other Ambulatory Visit (INDEPENDENT_AMBULATORY_CARE_PROVIDER_SITE_OTHER): Payer: Medicare Other

## 2014-09-17 ENCOUNTER — Encounter: Payer: Self-pay | Admitting: Internal Medicine

## 2014-09-17 VITALS — BP 110/62 | HR 79 | Temp 97.5°F | Ht 62.0 in | Wt 164.8 lb

## 2014-09-17 DIAGNOSIS — Z0000 Encounter for general adult medical examination without abnormal findings: Secondary | ICD-10-CM | POA: Diagnosis not present

## 2014-09-17 DIAGNOSIS — I10 Essential (primary) hypertension: Secondary | ICD-10-CM

## 2014-09-17 DIAGNOSIS — Z23 Encounter for immunization: Secondary | ICD-10-CM

## 2014-09-17 DIAGNOSIS — I480 Paroxysmal atrial fibrillation: Secondary | ICD-10-CM

## 2014-09-17 DIAGNOSIS — M810 Age-related osteoporosis without current pathological fracture: Secondary | ICD-10-CM

## 2014-09-17 DIAGNOSIS — E785 Hyperlipidemia, unspecified: Secondary | ICD-10-CM | POA: Diagnosis not present

## 2014-09-17 LAB — LIPID PANEL
CHOL/HDL RATIO: 4
Cholesterol: 181 mg/dL (ref 0–200)
HDL: 47.3 mg/dL (ref >39.00)
LDL Cholesterol: 100 mg/dL — ABNORMAL HIGH (ref 0–99)
NonHDL: 133.7
Triglycerides: 170 mg/dL — ABNORMAL HIGH (ref 0.0–149.0)
VLDL: 34 mg/dL (ref 0.0–40.0)

## 2014-09-17 LAB — BASIC METABOLIC PANEL
BUN: 29 mg/dL — AB (ref 6–23)
CHLORIDE: 103 meq/L (ref 96–112)
CO2: 30 meq/L (ref 19–32)
CREATININE: 1.36 mg/dL — AB (ref 0.40–1.20)
Calcium: 9.9 mg/dL (ref 8.4–10.5)
GFR: 38.93 mL/min — ABNORMAL LOW (ref >60.00)
GLUCOSE: 94 mg/dL (ref 70–99)
POTASSIUM: 4.1 meq/L (ref 3.5–5.1)
Sodium: 141 mEq/L (ref 135–145)

## 2014-09-17 NOTE — Assessment & Plan Note (Signed)
Not anticoag canidate Continue ASA, rate control - asymptomatic  Last cards note reviewed - no changes recommended

## 2014-09-17 NOTE — Patient Instructions (Addendum)
It was good to see you today.  We have reviewed your prior records including labs and tests today  Immunizations reviewed. Tetanus booster updated today and good for 10 years. All other immunizations are up-to-date as recommended  Test(s) ordered today. Your results will be released to [place] (or called to you) after review, usually within 72hours after test completion. If any changes need to be made, you will be notified at that same time.  Medications reviewed and updated, no changes recommended at this time. Refill on medication(s) as discussed today.  we'll make appointment for bone density scan to monitor osteoporosis. Our office will contact you regarding appointment(s) once made.  Please schedule followup in 6 months, call sooner if problems.  Health Maintenance Adopting a healthy lifestyle and getting preventive care can go a long way to promote health and wellness. Talk with your health care provider about what schedule of regular examinations is right for you. This is a good chance for you to check in with your provider about disease prevention and staying healthy. In between checkups, there are plenty of things you can do on your own. Experts have done a lot of research about which lifestyle changes and preventive measures are most likely to keep you healthy. Ask your health care provider for more information. WEIGHT AND DIET  Eat a healthy diet  Be sure to include plenty of vegetables, fruits, low-fat dairy products, and lean protein.  Do not eat a lot of foods high in solid fats, added sugars, or salt.  Get regular exercise. This is one of the most important things you can do for your health.  Most adults should exercise for at least 150 minutes each week. The exercise should increase your heart rate and make you sweat (moderate-intensity exercise).  Most adults should also do strengthening exercises at least twice a week. This is in addition to the moderate-intensity  exercise.  Maintain a healthy weight  Body mass index (BMI) is a measurement that can be used to identify possible weight problems. It estimates body fat based on height and weight. Your health care provider can help determine your BMI and help you achieve or maintain a healthy weight.  For females 49 years of age and older:   A BMI below 18.5 is considered underweight.  A BMI of 18.5 to 24.9 is normal.  A BMI of 25 to 29.9 is considered overweight.  A BMI of 30 and above is considered obese.  Watch levels of cholesterol and blood lipids  You should start having your blood tested for lipids and cholesterol at 79 years of age, then have this test every 5 years.  You may need to have your cholesterol levels checked more often if:  Your lipid or cholesterol levels are high.  You are older than 79 years of age.  You are at high risk for heart disease.  CANCER SCREENING   Lung Cancer  Lung cancer screening is recommended for adults 70-7 years old who are at high risk for lung cancer because of a history of smoking.  A yearly low-dose CT scan of the lungs is recommended for people who:  Currently smoke.  Have quit within the past 15 years.  Have at least a 30-pack-year history of smoking. A pack year is smoking an average of one pack of cigarettes a day for 1 year.  Yearly screening should continue until it has been 15 years since you quit.  Yearly screening should stop if you develop a  health problem that would prevent you from having lung cancer treatment.  Breast Cancer  Practice breast self-awareness. This means understanding how your breasts normally appear and feel.  It also means doing regular breast self-exams. Let your health care provider know about any changes, no matter how small.  If you are in your 20s or 30s, you should have a clinical breast exam (CBE) by a health care provider every 1-3 years as part of a regular health exam.  If you are 49 or  older, have a CBE every year. Also consider having a breast X-ray (mammogram) every year.  If you have a family history of breast cancer, talk to your health care provider about genetic screening.  If you are at high risk for breast cancer, talk to your health care provider about having an MRI and a mammogram every year.  Breast cancer gene (BRCA) assessment is recommended for women who have family members with BRCA-related cancers. BRCA-related cancers include:  Breast.  Ovarian.  Tubal.  Peritoneal cancers.  Results of the assessment will determine the need for genetic counseling and BRCA1 and BRCA2 testing. Cervical Cancer Routine pelvic examinations to screen for cervical cancer are no longer recommended for nonpregnant women who are considered low risk for cancer of the pelvic organs (ovaries, uterus, and vagina) and who do not have symptoms. A pelvic examination may be necessary if you have symptoms including those associated with pelvic infections. Ask your health care provider if a screening pelvic exam is right for you.   The Pap test is the screening test for cervical cancer for women who are considered at risk.  If you had a hysterectomy for a problem that was not cancer or a condition that could lead to cancer, then you no longer need Pap tests.  If you are older than 65 years, and you have had normal Pap tests for the past 10 years, you no longer need to have Pap tests.  If you have had past treatment for cervical cancer or a condition that could lead to cancer, you need Pap tests and screening for cancer for at least 20 years after your treatment.  If you no longer get a Pap test, assess your risk factors if they change (such as having a new sexual partner). This can affect whether you should start being screened again.  Some women have medical problems that increase their chance of getting cervical cancer. If this is the case for you, your health care provider may  recommend more frequent screening and Pap tests.  The human papillomavirus (HPV) test is another test that may be used for cervical cancer screening. The HPV test looks for the virus that can cause cell changes in the cervix. The cells collected during the Pap test can be tested for HPV.  The HPV test can be used to screen women 48 years of age and older. Getting tested for HPV can extend the interval between normal Pap tests from three to five years.  An HPV test also should be used to screen women of any age who have unclear Pap test results.  After 79 years of age, women should have HPV testing as often as Pap tests.  Colorectal Cancer  This type of cancer can be detected and often prevented.  Routine colorectal cancer screening usually begins at 79 years of age and continues through 79 years of age.  Your health care provider may recommend screening at an earlier age if you have risk factors  for colon cancer.  Your health care provider may also recommend using home test kits to check for hidden blood in the stool.  A small camera at the end of a tube can be used to examine your colon directly (sigmoidoscopy or colonoscopy). This is done to check for the earliest forms of colorectal cancer.  Routine screening usually begins at age 45.  Direct examination of the colon should be repeated every 5-10 years through 79 years of age. However, you may need to be screened more often if early forms of precancerous polyps or small growths are found. Skin Cancer  Check your skin from head to toe regularly.  Tell your health care provider about any new moles or changes in moles, especially if there is a change in a mole's shape or color.  Also tell your health care provider if you have a mole that is larger than the size of a pencil eraser.  Always use sunscreen. Apply sunscreen liberally and repeatedly throughout the day.  Protect yourself by wearing long sleeves, pants, a wide-brimmed hat,  and sunglasses whenever you are outside. HEART DISEASE, DIABETES, AND HIGH BLOOD PRESSURE   Have your blood pressure checked at least every 1-2 years. High blood pressure causes heart disease and increases the risk of stroke.  If you are between 26 years and 21 years old, ask your health care provider if you should take aspirin to prevent strokes.  Have regular diabetes screenings. This involves taking a blood sample to check your fasting blood sugar level.  If you are at a normal weight and have a low risk for diabetes, have this test once every three years after 79 years of age.  If you are overweight and have a high risk for diabetes, consider being tested at a younger age or more often. PREVENTING INFECTION  Hepatitis B  If you have a higher risk for hepatitis B, you should be screened for this virus. You are considered at high risk for hepatitis B if:  You were born in a country where hepatitis B is common. Ask your health care provider which countries are considered high risk.  Your parents were born in a high-risk country, and you have not been immunized against hepatitis B (hepatitis B vaccine).  You have HIV or AIDS.  You use needles to inject street drugs.  You live with someone who has hepatitis B.  You have had sex with someone who has hepatitis B.  You get hemodialysis treatment.  You take certain medicines for conditions, including cancer, organ transplantation, and autoimmune conditions. Hepatitis C  Blood testing is recommended for:  Everyone born from [birth year] through 1963.  Anyone with known risk factors for hepatitis C. Sexually transmitted infections (STIs)  You should be screened for sexually transmitted infections (STIs) including gonorrhea and chlamydia if:  You are sexually active and are younger than 79 years of age.  You are older than 79 years of age and your health care provider tells you that you are at risk for this type of infection.  Your  sexual activity has changed since you were last screened and you are at an increased risk for chlamydia or gonorrhea. Ask your health care provider if you are at risk.  If you do not have HIV, but are at risk, it may be recommended that you take a prescription medicine daily to prevent HIV infection. This is called pre-exposure prophylaxis (PrEP). You are considered at risk if:  You are sexually active and  do not regularly use condoms or know the HIV status of your partner(s).  You take drugs by injection.  You are sexually active with a partner who has HIV. Talk with your health care provider about whether you are at high risk of being infected with HIV. If you choose to begin PrEP, you should first be tested for HIV. You should then be tested every 3 months for as long as you are taking PrEP.  PREGNANCY   If you are premenopausal and you may become pregnant, ask your health care provider about preconception counseling.  If you may become pregnant, take 400 to 800 micrograms (mcg) of folic acid every day.  If you want to prevent pregnancy, talk to your health care provider about birth control (contraception). OSTEOPOROSIS AND MENOPAUSE   Osteoporosis is a disease in which the bones lose minerals and strength with aging. This can result in serious bone fractures. Your risk for osteoporosis can be identified using a bone density scan.  If you are 44 years of age or older, or if you are at risk for osteoporosis and fractures, ask your health care provider if you should be screened.  Ask your health care provider whether you should take a calcium or vitamin D supplement to lower your risk for osteoporosis.  Menopause may have certain physical symptoms and risks.  Hormone replacement therapy may reduce some of these symptoms and risks. Talk to your health care provider about whether hormone replacement therapy is right for you.  HOME CARE INSTRUCTIONS   Schedule regular health, dental, and  eye exams.  Stay current with your immunizations.   Do not use any tobacco products including cigarettes, chewing tobacco, or electronic cigarettes.  If you are pregnant, do not drink alcohol.  If you are breastfeeding, limit how much and how often you drink alcohol.  Limit alcohol intake to no more than 1 drink per day for nonpregnant women. One drink equals 12 ounces of beer, 5 ounces of wine, or 1 ounces of hard liquor.  Do not use street drugs.  Do not share needles.  Ask your health care provider for help if you need support or information about quitting drugs.  Tell your health care provider if you often feel depressed.  Tell your health care provider if you have ever been abused or do not feel safe at home. Document Released: 09/22/2010 Document Revised: 07/24/2013 Document Reviewed: 02/08/2013 [facility] Patient Information 2015 [place], [place]. This information is not intended to replace advice given to you by your health care provider. Make sure you discuss any questions you have with your health care provider.

## 2014-09-17 NOTE — Assessment & Plan Note (Signed)
BP Readings from Last 3 Encounters:  09/17/14 110/62  03/15/14 135/62  03/09/14 106/65   The current medical regimen is effective;  continue present plan and medications.

## 2014-09-17 NOTE — Progress Notes (Signed)
Pre visit review using our clinic review tool, if applicable. No additional management support is needed unless otherwise documented below in the visit note. 

## 2014-09-17 NOTE — Assessment & Plan Note (Signed)
Schedule for DEXA -  Hx compression fx and humerus fx with accidental fall in 2009 Continue WB exercises and Ca+D as ongoing

## 2014-09-17 NOTE — Progress Notes (Signed)
Subjective:    Patient ID: [name], female    DOB: 1923/12/26, 81 y.o.   MRN: [id number]  HPI   Here for medicare wellness  Diet: heart healthy Physical activity: sedentary Depression/mood screen: negative Hearing: intact to whispered voice Visual acuity: grossly normal, performs annual eye exam  ADLs: capable Fall risk: none Home safety: good Cognitive evaluation: intact to orientation, naming, recall and repetition EOL planning: adv directives, full code/ I agree  I have personally reviewed and have noted 1. The patient's medical and social history 2. Their use of alcohol, tobacco or illicit drugs 3. Their current medications and supplements 4. The patient's functional ability including ADL's, fall risks, home safety risks and hearing or visual impairment. 5. Diet and physical activities 6. Evidence for depression or mood disorders  Also reviewed chronic medical conditions, interval events and current concerns  Past Medical History  Diagnosis Date  . Unspecified chronic bronchitis   . HTN (hypertension)   . CAD (coronary artery disease)     s/p CABG  . S/P AVR (aortic valve replacement) 2003    bioprosthetic   . Atrial fibrillation     not anticoagulation candidate due to falls  . Carotid artery stenosis   . Hyperlipidemia   . Esophageal reflux   . Diverticulosis of colon (without mention of hemorrhage)   . Osteoarthrosis, unspecified whether generalized or localized, unspecified site   . Osteoporosis, unspecified   . Dementia   . Anxiety state, unspecified   . Anemia, unspecified   . Complete heart block 12/2011    s/p St.Jude dual chamber PPM 01/12/12  . Thoracic aortic aneurysm 2003    s/p repair  . Pacemaker-St.Jude 01/13/2012   Family History  Problem Relation Age of Onset  . Diabetes Brother   . Diabetes Sister    History  Substance Use Topics  . Smoking status: Never Smoker   . Smokeless tobacco: Never Used  . Alcohol Use: No      Review of Systems  Constitutional: Negative for fatigue and unexpected weight change.  Respiratory: Negative for cough, shortness of breath and wheezing.   Cardiovascular: Negative for chest pain, palpitations and leg swelling.  Gastrointestinal: Negative for nausea, abdominal pain and diarrhea.  Neurological: Negative for dizziness, weakness, light-headedness and headaches.  Psychiatric/Behavioral: Negative for dysphoric mood. The patient is not nervous/anxious.   All other systems reviewed and are negative.      Objective:    Physical Exam  Constitutional: She appears well-developed and well-nourished. No distress.  Cardiovascular: Normal rate, regular rhythm and normal heart sounds.   No murmur heard. Pulmonary/Chest: Effort normal and breath sounds normal. No respiratory distress.  Musculoskeletal: She exhibits no edema.    BP 110/62 mmHg  Pulse 79  Temp(Src) 97.5 F (36.4 C) (Oral)  Ht 5\' 2"  (1.575 m)  Wt 164 lb 12 oz (74.73 kg)  BMI 30.13 kg/m2  SpO2 94% Wt Readings from Last 3 Encounters:  09/17/14 164 lb 12 oz (74.73 kg)  03/05/14 168 lb 8 oz (76.431 kg)  11/22/13 168 lb 1.9 oz (76.259 kg)     Lab Results  Component Value Date   WBC 7.7 08/31/2013   HGB 13.2 08/31/2013   HCT 39.8 08/31/2013   PLT 232.0 08/31/2013   GLUCOSE 86 08/31/2013   CHOL 177 08/31/2013   TRIG 161.0* 08/31/2013   HDL 47.30 08/31/2013   LDLDIRECT 108.0 06/29/2012   LDLCALC 98 08/31/2013   ALT 7 08/31/2013   AST 17  08/31/2013   NA 139 08/31/2013   K 4.1 08/31/2013   CL 102 08/31/2013   CREATININE 1.3* 08/31/2013   BUN 30* 08/31/2013   CO2 28 08/31/2013   TSH 1.24 08/31/2013   INR 1.03 01/11/2012   HGBA1C 5.7* 01/11/2012    Dg Abd Acute W/chest  03/15/2014   CLINICAL DATA:  Constipation.  Distended abdomen  EXAM: ACUTE ABDOMEN SERIES (ABDOMEN 2 VIEW & CHEST 1 VIEW)  COMPARISON:  Chest 01/13/2012  FINDINGS: Aortic valve replacement. Dual lead pacemaker unchanged.  Thoracic aortic aneurysm stable. No heart failure. Negative for infiltrate. Left basilar pleural scarring is unchanged.  Mild to moderate retained stool throughout the colon including the rectum. Negative for bowel obstruction or free air. Negative for renal calculi  Left hip replacement. Advanced osteoarthritis and probable chronic AVN of the right hip.  IMPRESSION: No active cardiopulmonary abnormality  Mild to moderate retained stool in the colon without bowel obstruction.   Electronically Signed   By: Franchot Gallo M.D.   On: 03/15/2014 10:03       Assessment & Plan:   AWV/z00.00 - Today patient counseled on age appropriate routine health concerns for screening and prevention, each reviewed and up to date or declined. Immunizations reviewed and up to date or declined. Labs ordered and reviewed. Risk factors for depression reviewed and negative. Hearing function and visual acuity are intact. ADLs screened and addressed as needed. Functional ability and level of safety reviewed and appropriate. Education, counseling and referrals performed based on assessed risks today. Patient provided with a copy of personalized plan for preventive services.  Problem List Items Addressed This Visit    Essential hypertension    BP Readings from Last 3 Encounters:  09/17/14 110/62  03/15/14 135/62  03/09/14 106/65   The current medical regimen is effective;  continue present plan and medications.      Relevant Orders   Basic metabolic panel   Lipid panel   Hyperlipidemia    On statin for CAD hx Check annually and titrate as needed      Relevant Orders   Lipid panel   Osteoporosis    Schedule for DEXA -  Hx compression fx and humerus fx with accidental fall in 2009 Continue WB exercises and Ca+D as ongoing      Relevant Orders   DG Bone Density   Paroxysmal atrial fibrillation    Not anticoag canidate Continue ASA, rate control - asymptomatic  Last cards note reviewed - no changes recommended         Other Visit Diagnoses    Routine general medical examination at a health care facility    -  Primary        Gwendolyn Grant, MD

## 2014-09-17 NOTE — Assessment & Plan Note (Signed)
On statin for CAD hx Check annually and titrate as needed

## 2014-09-25 ENCOUNTER — Other Ambulatory Visit: Payer: Self-pay

## 2014-09-25 MED ORDER — ALENDRONATE SODIUM 70 MG PO TABS: 70.0000 mg | ORAL_TABLET | ORAL | Status: DC

## 2014-10-23 DIAGNOSIS — H903 Sensorineural hearing loss, bilateral: Secondary | ICD-10-CM | POA: Diagnosis not present

## 2014-10-23 DIAGNOSIS — H6123 Impacted cerumen, bilateral: Secondary | ICD-10-CM | POA: Diagnosis not present

## 2014-11-16 NOTE — Progress Notes (Signed)
HPI: FU coronary disease status post bypassing graft, history of thoracic aortic aneurysm repair, as well as aortic valve replacement and paroxysmal atrial fibrillation. Last Myoview was performed on September 02, 2006. At that time she had normal perfusion with an ejection fraction of 76%. Carotid Dopplers performed in Sept 2011 revealed normal carotids. Note she has also had problems with dementia with psychotic features. The patient was found to have symptomatic high degree AV block in October of 2013 and had a pacemaker placed. Echocardiogram April 2014 showed normal LV function, moderate left ventricular hypertrophy, bioprosthetic aortic valve with a mean gradient of 20 mmHg and mild left atrial enlargement. Since last seen, she denies dyspnea, chest pain, palpitations or syncope.  Current Outpatient Prescriptions  Medication Sig Dispense Refill  . acetaminophen (TYLENOL) 325 MG tablet Take 650 mg by mouth every 6 (six) hours as needed (pain).     Marland Kitchen alendronate (FOSAMAX) 70 MG tablet Take 1 tablet (70 mg total) by mouth once a week. Take with a full glass of water on an empty stomach. 12 tablet 1  . amLODipine (NORVASC) 5 MG tablet Take 1 tablet (5 mg total) by mouth daily. 90 tablet 3  . aspirin 325 MG EC tablet Take 325 mg by mouth daily with breakfast.    . betamethasone dipropionate 0.05 % lotion Apply topically 2 (two) times daily as needed. 60 mL 0  . calcium carbonate (TUMS - DOSED IN MG ELEMENTAL CALCIUM) 500 MG chewable tablet Chew 2 tablets by mouth daily as needed for heartburn.     . cholecalciferol (VITAMIN D) 1000 UNITS tablet Take 1,000 Units by mouth daily with breakfast.     . citalopram (CELEXA) 20 MG tablet Take 1 tablet (20 mg total) by mouth daily at 6 PM. Per dr Casimiro Needle    . folic acid (FOLVITE) 1 MG tablet TAKE ONE TABLET BY MOUTH ONCE DAILY 30 tablet 5  . furosemide (LASIX) 40 MG tablet Take 2 tablets (80 mg total) by mouth daily. 180 tablet 1  . mineral oil enema  Place 1 enema rectally once.    . polyethylene glycol powder (MIRALAX) powder Take 17 g by mouth daily as needed (constipation).     . pravastatin (PRAVACHOL) 40 MG tablet Take 1 tablet (40 mg total) by mouth at bedtime. 90 tablet 3  . risperiDONE (RISPERDAL) 0.25 MG tablet Take 1 tablet by mouth at bedtime.    . senna-docusate (SENOKOT-S) 8.6-50 MG per tablet Take 2 tablets by mouth daily as needed for mild constipation.     . traMADol (ULTRAM) 50 MG tablet Take 50 mg by mouth 3 (three) times daily as needed (pain).     No current facility-administered medications for this visit.     Past Medical History  Diagnosis Date  . Unspecified chronic bronchitis   . HTN (hypertension)   . CAD (coronary artery disease)     s/p CABG  . S/P AVR (aortic valve replacement) 2003    bioprosthetic   . Atrial fibrillation     not anticoagulation candidate due to falls  . Carotid artery stenosis   . Hyperlipidemia   . Esophageal reflux   . Diverticulosis of colon (without mention of hemorrhage)   . Osteoarthrosis, unspecified whether generalized or localized, unspecified site   . Osteoporosis, unspecified   . Dementia   . Anxiety state, unspecified   . Anemia, unspecified   . Complete heart block 12/2011    s/p St.Jude dual chamber  PPM 01/12/12  . Thoracic aortic aneurysm 2003    s/p repair  . Pacemaker-St.Jude 01/13/2012    Past Surgical History  Procedure Laterality Date  . Total hip arthroplasty  1998    left  . Vesicovaginal fistula closure w/ tah    . Intraocular lens insertion      bilateral  . Coronary artery bypass graft    . Aortic valve replacement  2003  . Cataract extraction      bilateral  . Pacemaker insertion  01/12/12    SJM Accent DR RF implanted by Dr Rayann Heman for CHB  . Permanent pacemaker insertion N/A 01/12/2012    Procedure: PERMANENT PACEMAKER INSERTION;  Surgeon: Thompson Grayer, MD;  Location: [facility] CATH LAB;  Service: Cardiovascular;  Laterality: N/A;    Social  History   Social History  . Marital Status: Married    Spouse Name: [name]  . Number of Children: N/A  . Years of Education: N/A   Occupational History  . retired    Social History Main Topics  . Smoking status: Never Smoker   . Smokeless tobacco: Never Used  . Alcohol Use: No  . Drug Use: No  . Sexual Activity: Not on file   Other Topics Concern  . Not on file   Social History Narrative   Lives with husband. Mother had kidney problems.  Father had coronary artery  disease.  Both died when the patient was young.  She does not know how old  they were.  She has one brother who died at age 53 of a CVA, although, he  had coronary artery disease and diabetes.  Another brother died at age 55 of  a stroke.  He also had coronary artery disease.  One sister is living, she  has coronary artery disease and diabetes and one sister has anginal symptoms. Her sister has a pacemaker (history palpitations).           ROS: no fevers or chills, productive cough, hemoptysis, dysphasia, odynophagia, melena, hematochezia, dysuria, hematuria, rash, seizure activity, orthopnea, PND, pedal edema, claudication. Remaining systems are negative.  Physical Exam: Well-developed well-nourished in no acute distress.  Skin is warm and dry.  HEENT is normal.  Neck is supple.  Chest is clear to auscultation with normal expansion.  Cardiovascular exam is regular rate and rhythm. 2/6 systolic murmur left sternal border. No diastolic murmur. Abdominal exam nontender or distended. No masses palpated. Extremities show no edema. neuro grossly intact  ECG sinus rhythm at a rate of 69. Left bundle branch block.

## 2014-11-20 ENCOUNTER — Ambulatory Visit (INDEPENDENT_AMBULATORY_CARE_PROVIDER_SITE_OTHER): Payer: Medicare Other | Admitting: Cardiology

## 2014-11-20 ENCOUNTER — Encounter: Payer: Self-pay | Admitting: Cardiology

## 2014-11-20 VITALS — BP 146/82 | HR 69 | Ht 62.0 in | Wt 165.6 lb

## 2014-11-20 DIAGNOSIS — I480 Paroxysmal atrial fibrillation: Secondary | ICD-10-CM

## 2014-11-20 DIAGNOSIS — Z950 Presence of cardiac pacemaker: Secondary | ICD-10-CM | POA: Diagnosis not present

## 2014-11-20 DIAGNOSIS — I10 Essential (primary) hypertension: Secondary | ICD-10-CM | POA: Diagnosis not present

## 2014-11-20 DIAGNOSIS — I25810 Atherosclerosis of coronary artery bypass graft(s) without angina pectoris: Secondary | ICD-10-CM | POA: Diagnosis not present

## 2014-11-20 NOTE — Assessment & Plan Note (Signed)
Continue aspirin and statin. 

## 2014-11-20 NOTE — Assessment & Plan Note (Signed)
Continue statin. 

## 2014-11-20 NOTE — Assessment & Plan Note (Signed)
Continue present blood pressure medications. 

## 2014-11-20 NOTE — Assessment & Plan Note (Signed)
Continue SBE prophylaxis. 

## 2014-11-20 NOTE — Assessment & Plan Note (Signed)
Followed by electrophysiology. 

## 2014-11-20 NOTE — Patient Instructions (Signed)
Your physician wants you to follow-up in: ONE YEAR WITH DR CRENSHAW You will receive a reminder letter in the mail two months in advance. If you don't receive a letter, please call our office to schedule the follow-up appointment.  

## 2014-11-20 NOTE — Assessment & Plan Note (Signed)
Patient remains in sinus rhythm. Not felt to be an anticoagulation candidate given her unsteady gait and history of fall.

## 2014-11-22 ENCOUNTER — Other Ambulatory Visit: Payer: Self-pay | Admitting: Internal Medicine

## 2014-12-10 ENCOUNTER — Encounter: Payer: Self-pay | Admitting: Internal Medicine

## 2014-12-10 ENCOUNTER — Ambulatory Visit (INDEPENDENT_AMBULATORY_CARE_PROVIDER_SITE_OTHER): Payer: Medicare Other | Admitting: Internal Medicine

## 2014-12-10 VITALS — BP 130/62 | HR 80 | Ht 62.5 in | Wt 167.0 lb

## 2014-12-10 DIAGNOSIS — I25810 Atherosclerosis of coronary artery bypass graft(s) without angina pectoris: Secondary | ICD-10-CM | POA: Diagnosis not present

## 2014-12-10 DIAGNOSIS — Z950 Presence of cardiac pacemaker: Secondary | ICD-10-CM | POA: Diagnosis not present

## 2014-12-10 DIAGNOSIS — I441 Atrioventricular block, second degree: Secondary | ICD-10-CM

## 2014-12-10 DIAGNOSIS — I10 Essential (primary) hypertension: Secondary | ICD-10-CM

## 2014-12-10 DIAGNOSIS — I480 Paroxysmal atrial fibrillation: Secondary | ICD-10-CM | POA: Diagnosis not present

## 2014-12-10 LAB — CUP PACEART INCLINIC DEVICE CHECK
Battery Remaining Longevity: 111.6 mo
Brady Statistic RA Percent Paced: 15 %
Brady Statistic RV Percent Paced: 88 %
Date Time Interrogation Session: 20160919131617
Lead Channel Pacing Threshold Amplitude: 0.375 V
Lead Channel Pacing Threshold Amplitude: 0.5 V
Lead Channel Pacing Threshold Pulse Width: 0.4 ms
Lead Channel Sensing Intrinsic Amplitude: 5 mV
Lead Channel Setting Pacing Amplitude: 0.75 V
Lead Channel Setting Pacing Amplitude: 1.375
Lead Channel Setting Pacing Pulse Width: 0.4 ms
MDC IDC MSMT BATTERY VOLTAGE: 2.93 V
MDC IDC MSMT LEADCHNL RA IMPEDANCE VALUE: 525 Ohm
MDC IDC MSMT LEADCHNL RV IMPEDANCE VALUE: 662.5 Ohm
MDC IDC MSMT LEADCHNL RV PACING THRESHOLD PULSEWIDTH: 0.4 ms
MDC IDC MSMT LEADCHNL RV SENSING INTR AMPL: 9.7 mV
MDC IDC PG SERIAL: 7393244
MDC IDC SET LEADCHNL RV SENSING SENSITIVITY: 2 mV

## 2014-12-10 NOTE — Progress Notes (Signed)
PCP: Gwendolyn Grant, MD Primary Cardiologist:  Dr Breck Coons is a 81 y.o. female who presents today for routine electrophysiology followup.  Since having her pacemaker implanted, the patient reports doing very well.  Today, she denies symptoms of palpitations, chest pain, shortness of breath,  lower extremity edema, dizziness, presyncope, or syncope.  The patient is otherwise without complaint today.   Past Medical History  Diagnosis Date  . Unspecified chronic bronchitis   . HTN (hypertension)   . CAD (coronary artery disease)     s/p CABG  . S/P AVR (aortic valve replacement) 2003    bioprosthetic   . Atrial fibrillation     not anticoagulation candidate due to falls  . Carotid artery stenosis   . Hyperlipidemia   . Esophageal reflux   . Diverticulosis of colon (without mention of hemorrhage)   . Osteoarthrosis, unspecified whether generalized or localized, unspecified site   . Osteoporosis, unspecified   . Dementia   . Anxiety state, unspecified   . Anemia, unspecified   . Complete heart block 12/2011    s/p St.Jude dual chamber PPM 01/12/12  . Thoracic aortic aneurysm 2003    s/p repair  . Pacemaker-St.Jude 01/13/2012   Past Surgical History  Procedure Laterality Date  . Total hip arthroplasty  1998    left  . Vesicovaginal fistula closure w/ tah    . Intraocular lens insertion      bilateral  . Coronary artery bypass graft    . Aortic valve replacement  2003  . Cataract extraction      bilateral  . Pacemaker insertion  01/12/12    SJM Accent DR RF implanted by Dr Rayann Heman for CHB  . Permanent pacemaker insertion N/A 01/12/2012    Procedure: PERMANENT PACEMAKER INSERTION;  Surgeon: Thompson Grayer, MD;  Location: [facility] CATH LAB;  Service: Cardiovascular;  Laterality: N/A;    Current Outpatient Prescriptions  Medication Sig Dispense Refill  . acetaminophen (TYLENOL) 325 MG tablet Take 650 mg by mouth every 6 (six) hours as needed (pain).     Marland Kitchen amLODipine  (NORVASC) 5 MG tablet Take 1 tablet (5 mg total) by mouth daily. 90 tablet 3  . aspirin 325 MG EC tablet Take 325 mg by mouth daily with breakfast.    . betamethasone dipropionate 0.05 % lotion Apply 1 application topically 2 (two) times daily as needed (psoriasis).  60 mL 0  . calcium carbonate (TUMS - DOSED IN MG ELEMENTAL CALCIUM) 500 MG chewable tablet Chew 2 tablets by mouth daily as needed for heartburn.     . cholecalciferol (VITAMIN D) 1000 UNITS tablet Take 1,000 Units by mouth daily with breakfast.     . citalopram (CELEXA) 20 MG tablet Take 1 tablet (20 mg total) by mouth daily at 6 PM. Per dr Casimiro Needle    . folic acid (FOLVITE) 1 MG tablet TAKE ONE TABLET BY MOUTH ONCE DAILY 30 tablet 5  . furosemide (LASIX) 40 MG tablet Take 2 tablets (80 mg total) by mouth daily. 180 tablet 1  . mineral oil enema Place 1 enema rectally daily as needed (Bowel health).     . polyethylene glycol powder (MIRALAX) powder Take 17 g by mouth daily as needed (constipation).     . pravastatin (PRAVACHOL) 40 MG tablet Take 1 tablet (40 mg total) by mouth at bedtime. 90 tablet 3  . risperiDONE (RISPERDAL) 0.25 MG tablet Take 1 tablet by mouth at bedtime.    . senna-docusate (SENOKOT-S) 8.6-50  MG per tablet Take 2 tablets by mouth daily as needed for mild constipation.     . traMADol (ULTRAM) 50 MG tablet Take 50 mg by mouth 3 (three) times daily as needed (pain).     No current facility-administered medications for this visit.    Physical Exam: Filed Vitals:   12/10/14 1200  BP: 130/62  Pulse: 80  Height: 5' 2.5" (1.588 m)  Weight: 167 lb (75.751 kg)    GEN- The patient is well appearing, alert and oriented x 3 today.   Head- normocephalic, atraumatic Eyes-  Sclera clear, conjunctiva pink Ears- hearing intact Oropharynx- clear Lungs- Clear to ausculation bilaterally, normal work of breathing Chest- pacemaker pocket is well healed Heart- Regular rate and rhythm  GI- soft, NT, ND, + BS Extremities-  no clubbing, cyanosis, or edema  Pacemaker interrogation- reviewed in detail today,  See PACEART report  Assessment and Plan:  1. Mobitz II second degree AV block Normal pacemaker function See Pace Art report  2. HTN Stable No change required today  3. CAD No ischemic symptoms No changes today  4. Afib Not presently anticoagulated due to fall concerns and advanced age.     Merlin Return to see EP NP in 1 year

## 2014-12-10 NOTE — Patient Instructions (Signed)
Medication Instructions:  Your physician recommends that you continue on your current medications as directed. Please refer to the Current Medication list given to you today.   Labwork: None ordered  Testing/Procedures: None ordered  Follow-Up: Your physician wants you to follow-up in: 12 months with Chanetta Marshall, NP You will receive a reminder letter in the mail two months in advance. If you don't receive a letter, please call our office to schedule the follow-up appointment.   Remote monitoring is used to monitor your Pacemaker  from home. This monitoring reduces the number of office visits required to check your device to one time per year. It allows [place] to keep an eye on the functioning of your device to ensure it is working properly. You are scheduled for a device check from home on 03/11/15. You may send your transmission at any time that day. If you have a wireless device, the transmission will be sent automatically. After your physician reviews your transmission, you will receive a postcard with your next transmission date.    Any Other Special Instructions Will Be Listed Below (If Applicable).

## 2015-01-03 ENCOUNTER — Ambulatory Visit (INDEPENDENT_AMBULATORY_CARE_PROVIDER_SITE_OTHER): Payer: Medicare Other

## 2015-01-03 DIAGNOSIS — Z23 Encounter for immunization: Secondary | ICD-10-CM

## 2015-01-14 DIAGNOSIS — H903 Sensorineural hearing loss, bilateral: Secondary | ICD-10-CM | POA: Diagnosis not present

## 2015-01-14 DIAGNOSIS — H6123 Impacted cerumen, bilateral: Secondary | ICD-10-CM | POA: Diagnosis not present

## 2015-01-14 DIAGNOSIS — H61303 Acquired stenosis of external ear canal, unspecified, bilateral: Secondary | ICD-10-CM | POA: Diagnosis not present

## 2015-01-23 ENCOUNTER — Other Ambulatory Visit: Payer: Self-pay | Admitting: Internal Medicine

## 2015-03-11 ENCOUNTER — Ambulatory Visit (INDEPENDENT_AMBULATORY_CARE_PROVIDER_SITE_OTHER): Payer: Medicare Other | Admitting: *Deleted

## 2015-03-11 DIAGNOSIS — I441 Atrioventricular block, second degree: Secondary | ICD-10-CM | POA: Diagnosis not present

## 2015-03-11 LAB — CUP PACEART REMOTE DEVICE CHECK
Battery Remaining Longevity: 113 mo
Battery Remaining Percentage: 81 %
Brady Statistic AP VP Percent: 26 %
Brady Statistic AS VP Percent: 73 %
Brady Statistic RV Percent Paced: 99 %
Date Time Interrogation Session: 20161219084509
Implantable Lead Implant Date: 20131022
Implantable Lead Model: 1944
Lead Channel Impedance Value: 600 Ohm
Lead Channel Pacing Threshold Amplitude: 0.5 V
Lead Channel Pacing Threshold Pulse Width: 0.4 ms
Lead Channel Sensing Intrinsic Amplitude: 12 mV
Lead Channel Sensing Intrinsic Amplitude: 5 mV
Lead Channel Setting Pacing Amplitude: 0.75 V
Lead Channel Setting Pacing Amplitude: 1.375
Lead Channel Setting Pacing Pulse Width: 0.4 ms
Lead Channel Setting Sensing Sensitivity: 2 mV
MDC IDC LEAD IMPLANT DT: 20131022
MDC IDC LEAD LOCATION: 753859
MDC IDC LEAD LOCATION: 753860
MDC IDC LEAD MODEL: 1948
MDC IDC MSMT BATTERY VOLTAGE: 2.93 V
MDC IDC MSMT LEADCHNL RA IMPEDANCE VALUE: 480 Ohm
MDC IDC MSMT LEADCHNL RA PACING THRESHOLD AMPLITUDE: 0.375 V
MDC IDC MSMT LEADCHNL RA PACING THRESHOLD PULSEWIDTH: 0.4 ms
MDC IDC PG SERIAL: 7393244
MDC IDC STAT BRADY AP VS PERCENT: 1 %
MDC IDC STAT BRADY AS VS PERCENT: 1 %
MDC IDC STAT BRADY RA PERCENT PACED: 26 %
Pulse Gen Model: 2210

## 2015-03-11 NOTE — Progress Notes (Signed)
Remote pacemaker transmission.   

## 2015-03-14 ENCOUNTER — Encounter: Payer: Self-pay | Admitting: Cardiology

## 2015-03-19 ENCOUNTER — Encounter: Payer: Self-pay | Admitting: Internal Medicine

## 2015-03-19 ENCOUNTER — Ambulatory Visit (INDEPENDENT_AMBULATORY_CARE_PROVIDER_SITE_OTHER): Payer: Medicare Other | Admitting: Internal Medicine

## 2015-03-19 VITALS — BP 140/70 | HR 65 | Temp 97.5°F | Ht 62.5 in | Wt 170.0 lb

## 2015-03-19 DIAGNOSIS — I10 Essential (primary) hypertension: Secondary | ICD-10-CM | POA: Diagnosis not present

## 2015-03-19 DIAGNOSIS — I25810 Atherosclerosis of coronary artery bypass graft(s) without angina pectoris: Secondary | ICD-10-CM

## 2015-03-19 DIAGNOSIS — I480 Paroxysmal atrial fibrillation: Secondary | ICD-10-CM | POA: Diagnosis not present

## 2015-03-19 DIAGNOSIS — F03A0 Unspecified dementia, mild, without behavioral disturbance, psychotic disturbance, mood disturbance, and anxiety: Secondary | ICD-10-CM

## 2015-03-19 DIAGNOSIS — F411 Generalized anxiety disorder: Secondary | ICD-10-CM | POA: Diagnosis not present

## 2015-03-19 DIAGNOSIS — E785 Hyperlipidemia, unspecified: Secondary | ICD-10-CM | POA: Diagnosis not present

## 2015-03-19 DIAGNOSIS — F0390 Unspecified dementia without behavioral disturbance: Secondary | ICD-10-CM

## 2015-03-19 MED ORDER — AMLODIPINE BESYLATE 5 MG PO TABS: 5.0000 mg | ORAL_TABLET | Freq: Every day | ORAL | Status: DC

## 2015-03-19 MED ORDER — PRAVASTATIN SODIUM 40 MG PO TABS: 40.0000 mg | ORAL_TABLET | Freq: Every day | ORAL | Status: DC

## 2015-03-19 MED ORDER — FUROSEMIDE 40 MG PO TABS: 80.0000 mg | ORAL_TABLET | Freq: Every day | ORAL | Status: DC

## 2015-03-19 NOTE — Progress Notes (Signed)
Subjective:    Patient ID: [name], female    DOB: 1923-12-09, 81 y.o.   MRN: [id number]  HPI  Patient here for follow up - Also reviewed chronic medical conditions, interval events and current concerns  Past Medical History  Diagnosis Date  . Unspecified chronic bronchitis ([place])   . HTN (hypertension)   . CAD (coronary artery disease)     s/p CABG  . S/P AVR (aortic valve replacement) 2003    bioprosthetic   . Atrial fibrillation ([facility])     not anticoagulation candidate due to falls  . Carotid artery stenosis   . Hyperlipidemia   . Esophageal reflux   . Diverticulosis of colon (without mention of hemorrhage)   . Osteoarthrosis, unspecified whether generalized or localized, unspecified site   . Osteoporosis, unspecified   . Dementia   . Anxiety state, unspecified   . Anemia, unspecified   . Complete heart block ([place]) 12/2011    s/p St.Jude dual chamber PPM 01/12/12  . Thoracic aortic aneurysm ([place]) 2003    s/p repair  . Pacemaker-St.Jude 01/13/2012    Review of Systems  Constitutional: Positive for fatigue. Negative for unexpected weight change.  Respiratory: Negative for cough and shortness of breath.   Cardiovascular: Negative for chest pain and leg swelling.       Objective:    Physical Exam  Constitutional: She appears well-developed and well-nourished. No distress.  Spouse at side  Cardiovascular: Normal rate, regular rhythm and normal heart sounds.   No murmur heard. Pulmonary/Chest: Effort normal and breath sounds normal. No respiratory distress.  Musculoskeletal: She exhibits no edema.  Vitals reviewed.   BP 140/70 mmHg  Pulse 65  Temp(Src) 97.5 F (36.4 C) (Oral)  Ht 5' 2.5" (1.588 m)  Wt 170 lb (77.111 kg)  BMI 30.58 kg/m2  SpO2 96% Wt Readings from Last 3 Encounters:  03/19/15 170 lb (77.111 kg)  12/10/14 167 lb (75.751 kg)  11/20/14 165 lb 9.6 oz (75.116 kg)     Lab Results  Component Value Date   WBC 7.7 08/31/2013   HGB 13.2  08/31/2013   HCT 39.8 08/31/2013   PLT 232.0 08/31/2013   GLUCOSE 94 09/17/2014   CHOL 181 09/17/2014   TRIG 170.0* 09/17/2014   HDL 47.30 09/17/2014   LDLDIRECT 108.0 06/29/2012   LDLCALC 100* 09/17/2014   ALT 7 08/31/2013   AST 17 08/31/2013   NA 141 09/17/2014   K 4.1 09/17/2014   CL 103 09/17/2014   CREATININE 1.36* 09/17/2014   BUN 29* 09/17/2014   CO2 30 09/17/2014   TSH 1.24 08/31/2013   INR 1.03 01/11/2012   HGBA1C 5.7* 01/11/2012    Dg Bone Density  09/24/2014  Date of study: 09/17/14 Exam: DUAL X-RAY ABSORPTIOMETRY (DXA) FOR BONE MINERAL DENSITY (BMD) Instrument: [employer] Provider: PCP Indication: screening for osteoporosis Comparison: none (please note that it is not possible to compare data from different instruments) Clinical data: Pt is a 81 y.o. female with h/o fractures. Results:  Lumbar spine (L1-L4) Femoral neck (FN) 33% distal radius T-score -1.2 RFN: - 3.3 LFN: - 1.9 -1.6 Change in BMD from previous DXA test (%) n/a n/a n/a (*) statistically significant Assessment: Patient has OSTEOPOROSIS according to the [facility] classification for osteoporosis (see below). Fracture risk: high Comments: the technical quality of the study is good Evaluation for secondary causes should be considered if clinically indicated. Recommend optimizing calcium (1200 mg/day) and vitamin D (800 IU/day). Followup: Repeat BMD  is appropriate after 2 years or after 1-2 years if starting treatment. WHO criteria for diagnosis of osteoporosis in postmenopausal women and in men 4 y/o or older: - normal: T-score -1.0 to + 1.0 - osteopenia/low bone density: T-score between -2.5 and -1.0 - osteoporosis: T-score below -2.5 - severe osteoporosis: T-score below -2.5 with history of fragility fracture Note: although not part of the WHO classification, the presence of a fragility fracture, regardless of the T-score, should be considered diagnostic of osteoporosis, provided other causes for the  fracture have been excluded. Treatment: The National Osteoporosis Foundation recommends that treatment be considered in postmenopausal women and men age 31 or older with: 1. Hip or vertebral (clinical or morphometric) fracture 2. T-score of - 2.5 or lower at the spine or hip 3. 10-year fracture probability by FRAX of at least 20% for a major osteoporotic fracture and 3% for a hip fracture Loura Pardon MD       Assessment & Plan:   Problem List Items Addressed This Visit    Anxiety state    Follows annually with [facility] -  symptoms well controlled with current meds: celexa and risperdal  The current medical regimen is effective;  continue present plan and medications.      Essential hypertension    BP Readings from Last 3 Encounters:  03/19/15 140/70  12/10/14 130/62  11/20/14 146/82   The current medical regimen is effective;  continue present plan and medications.      Relevant Medications   pravastatin (PRAVACHOL) 40 MG tablet   furosemide (LASIX) 40 MG tablet   amLODipine (NORVASC) 5 MG tablet   Hyperlipidemia    On statin for CAD hx Check annually and titrate as needed      Relevant Medications   pravastatin (PRAVACHOL) 40 MG tablet   furosemide (LASIX) 40 MG tablet   amLODipine (NORVASC) 5 MG tablet   Mild dementia    Neuro/cognitive status stable on current psychiatric meds No changes recommended      Paroxysmal atrial fibrillation (HCC) - Primary    Not anticoag canidate due to risk>benefit -  Prior treatment effort with warfarin too hard to regulate dose per spouse Continue ASA, rate control - asymptomatic  Last cards note reviewed - no changes recommended       Relevant Medications   pravastatin (PRAVACHOL) 40 MG tablet   furosemide (LASIX) 40 MG tablet   amLODipine (NORVASC) 5 MG tablet     Time spent with pt/family today 25 minutes, greater than 50% time spent counseling patient on cognitive diagnosis, depression and medication review. Also review of  prior records   Gwendolyn Grant, MD

## 2015-03-19 NOTE — Assessment & Plan Note (Signed)
On statin for CAD hx Check annually and titrate as needed 

## 2015-03-19 NOTE — Assessment & Plan Note (Signed)
Follows annually with [facility]ovsky -  symptoms well controlled with current meds: celexa and risperdal  The current medical regimen is effective;  continue present plan and medications.

## 2015-03-19 NOTE — Assessment & Plan Note (Signed)
Not anticoag canidate due to risk>benefit -  Prior treatment effort with warfarin too hard to regulate dose per spouse Continue ASA, rate control - asymptomatic  Last cards note reviewed - no changes recommended

## 2015-03-19 NOTE — Assessment & Plan Note (Signed)
Neuro/cognitive status stable on current psychiatric meds No changes recommended

## 2015-03-19 NOTE — Patient Instructions (Signed)
It was good to see you today.  We have reviewed your prior records including labs and tests today  Medications reviewed and updated, no changes recommended at this time. Refill on medication(s) as discussed today.  Please schedule followup in 6 months with Dr. Quay Burow (your new PCP), please call sooner if problems.

## 2015-03-19 NOTE — Assessment & Plan Note (Signed)
BP Readings from Last 3 Encounters:  03/19/15 140/70  12/10/14 130/62  11/20/14 146/82   The current medical regimen is effective;  continue present plan and medications.

## 2015-03-19 NOTE — Progress Notes (Signed)
Pre visit review using our clinic review tool, if applicable. No additional management support is needed unless otherwise documented below in the visit note. 

## 2015-04-09 DIAGNOSIS — Z7689 Persons encountering health services in other specified circumstances: Secondary | ICD-10-CM

## 2015-05-08 DIAGNOSIS — F329 Major depressive disorder, single episode, unspecified: Secondary | ICD-10-CM | POA: Diagnosis not present

## 2015-06-10 ENCOUNTER — Ambulatory Visit (INDEPENDENT_AMBULATORY_CARE_PROVIDER_SITE_OTHER): Payer: Medicare Other | Admitting: *Deleted

## 2015-06-10 DIAGNOSIS — I441 Atrioventricular block, second degree: Secondary | ICD-10-CM

## 2015-06-10 DIAGNOSIS — Z950 Presence of cardiac pacemaker: Secondary | ICD-10-CM | POA: Diagnosis not present

## 2015-06-11 NOTE — Progress Notes (Signed)
Remote pacemaker transmission.   

## 2015-07-02 DIAGNOSIS — H61303 Acquired stenosis of external ear canal, unspecified, bilateral: Secondary | ICD-10-CM | POA: Diagnosis not present

## 2015-07-02 DIAGNOSIS — H6123 Impacted cerumen, bilateral: Secondary | ICD-10-CM | POA: Diagnosis not present

## 2015-07-02 DIAGNOSIS — H903 Sensorineural hearing loss, bilateral: Secondary | ICD-10-CM | POA: Diagnosis not present

## 2015-07-12 ENCOUNTER — Encounter: Payer: Self-pay | Admitting: Cardiology

## 2015-07-12 LAB — CUP PACEART REMOTE DEVICE CHECK
Battery Voltage: 2.93 V
Brady Statistic AP VP Percent: 22 %
Brady Statistic AP VS Percent: 1 %
Brady Statistic AS VP Percent: 77 %
Brady Statistic AS VS Percent: 1 %
Brady Statistic RA Percent Paced: 22 %
Date Time Interrogation Session: 20170320072309
Implantable Lead Implant Date: 20131022
Implantable Lead Location: 753859
Implantable Lead Model: 1948
Lead Channel Impedance Value: 610 Ohm
Lead Channel Pacing Threshold Amplitude: 0.375 V
Lead Channel Pacing Threshold Pulse Width: 0.4 ms
Lead Channel Sensing Intrinsic Amplitude: 5 mV
Lead Channel Setting Pacing Amplitude: 0.75 V
Lead Channel Setting Pacing Pulse Width: 0.4 ms
Lead Channel Setting Sensing Sensitivity: 2 mV
MDC IDC LEAD IMPLANT DT: 20131022
MDC IDC LEAD LOCATION: 753860
MDC IDC LEAD MODEL: 1944
MDC IDC MSMT BATTERY REMAINING LONGEVITY: 112 mo
MDC IDC MSMT BATTERY REMAINING PERCENTAGE: 81 %
MDC IDC MSMT LEADCHNL RA IMPEDANCE VALUE: 440 Ohm
MDC IDC MSMT LEADCHNL RA PACING THRESHOLD PULSEWIDTH: 0.4 ms
MDC IDC MSMT LEADCHNL RV PACING THRESHOLD AMPLITUDE: 0.5 V
MDC IDC SET LEADCHNL RA PACING AMPLITUDE: 1.375
MDC IDC STAT BRADY RV PERCENT PACED: 99 %
Pulse Gen Model: 2210
Pulse Gen Serial Number: 7393244

## 2015-07-17 ENCOUNTER — Other Ambulatory Visit: Payer: Self-pay | Admitting: Internal Medicine

## 2015-09-09 ENCOUNTER — Ambulatory Visit (INDEPENDENT_AMBULATORY_CARE_PROVIDER_SITE_OTHER): Payer: Medicare Other | Admitting: *Deleted

## 2015-09-09 DIAGNOSIS — Z950 Presence of cardiac pacemaker: Secondary | ICD-10-CM | POA: Diagnosis not present

## 2015-09-09 DIAGNOSIS — I441 Atrioventricular block, second degree: Secondary | ICD-10-CM | POA: Diagnosis not present

## 2015-09-09 NOTE — Progress Notes (Signed)
Remote pacemaker transmission.   

## 2015-09-10 ENCOUNTER — Ambulatory Visit (INDEPENDENT_AMBULATORY_CARE_PROVIDER_SITE_OTHER): Payer: Medicare Other | Admitting: Internal Medicine

## 2015-09-10 ENCOUNTER — Ambulatory Visit: Payer: Medicare Other | Admitting: Internal Medicine

## 2015-09-10 ENCOUNTER — Encounter: Payer: Self-pay | Admitting: Internal Medicine

## 2015-09-10 VITALS — BP 124/82 | HR 79 | Temp 98.6°F | Resp 16 | Wt 174.0 lb

## 2015-09-10 DIAGNOSIS — I10 Essential (primary) hypertension: Secondary | ICD-10-CM

## 2015-09-10 DIAGNOSIS — Z0000 Encounter for general adult medical examination without abnormal findings: Secondary | ICD-10-CM | POA: Diagnosis not present

## 2015-09-10 DIAGNOSIS — F0390 Unspecified dementia without behavioral disturbance: Secondary | ICD-10-CM

## 2015-09-10 DIAGNOSIS — I480 Paroxysmal atrial fibrillation: Secondary | ICD-10-CM

## 2015-09-10 DIAGNOSIS — E785 Hyperlipidemia, unspecified: Secondary | ICD-10-CM | POA: Diagnosis not present

## 2015-09-10 DIAGNOSIS — F03A0 Unspecified dementia, mild, without behavioral disturbance, psychotic disturbance, mood disturbance, and anxiety: Secondary | ICD-10-CM

## 2015-09-10 DIAGNOSIS — I2510 Atherosclerotic heart disease of native coronary artery without angina pectoris: Secondary | ICD-10-CM | POA: Diagnosis not present

## 2015-09-10 DIAGNOSIS — M1990 Unspecified osteoarthritis, unspecified site: Secondary | ICD-10-CM

## 2015-09-10 DIAGNOSIS — N183 Chronic kidney disease, stage 3 unspecified: Secondary | ICD-10-CM | POA: Insufficient documentation

## 2015-09-10 DIAGNOSIS — H9193 Unspecified hearing loss, bilateral: Secondary | ICD-10-CM

## 2015-09-10 DIAGNOSIS — M1611 Unilateral primary osteoarthritis, right hip: Secondary | ICD-10-CM

## 2015-09-10 LAB — CUP PACEART REMOTE DEVICE CHECK
Battery Remaining Longevity: 113 mo
Battery Remaining Percentage: 81 %
Brady Statistic AP VP Percent: 22 %
Brady Statistic AP VS Percent: 1 %
Brady Statistic AS VP Percent: 78 %
Brady Statistic AS VS Percent: 1 %
Brady Statistic RV Percent Paced: 99 %
Date Time Interrogation Session: 20170619095527
Implantable Lead Implant Date: 20131022
Implantable Lead Location: 753859
Implantable Lead Location: 753860
Implantable Lead Model: 1948
Lead Channel Impedance Value: 490 Ohm
Lead Channel Impedance Value: 600 Ohm
Lead Channel Pacing Threshold Amplitude: 0.5 V
Lead Channel Pacing Threshold Pulse Width: 0.4 ms
Lead Channel Sensing Intrinsic Amplitude: 5 mV
Lead Channel Setting Pacing Amplitude: 0.75 V
Lead Channel Setting Pacing Pulse Width: 0.4 ms
Lead Channel Setting Sensing Sensitivity: 2 mV
MDC IDC LEAD IMPLANT DT: 20131022
MDC IDC LEAD MODEL: 1944
MDC IDC MSMT BATTERY VOLTAGE: 2.93 V
MDC IDC MSMT LEADCHNL RA PACING THRESHOLD AMPLITUDE: 0.375 V
MDC IDC MSMT LEADCHNL RA PACING THRESHOLD PULSEWIDTH: 0.4 ms
MDC IDC MSMT LEADCHNL RV SENSING INTR AMPL: 12 mV
MDC IDC SET LEADCHNL RA PACING AMPLITUDE: 1.375
MDC IDC STAT BRADY RA PERCENT PACED: 21 %
Pulse Gen Model: 2210
Pulse Gen Serial Number: 7393244

## 2015-09-10 NOTE — Assessment & Plan Note (Signed)
Check cmp 

## 2015-09-10 NOTE — Progress Notes (Signed)
Pre visit review using our clinic review tool, if applicable. No additional management support is needed unless otherwise documented below in the visit note. 

## 2015-09-10 NOTE — Assessment & Plan Note (Signed)
Check lipid panel On pravastatin

## 2015-09-10 NOTE — Patient Instructions (Signed)
  [name] , Thank you for taking time to come for your Medicare Wellness Visit. I appreciate your ongoing commitment to your health goals. Please review the following plan we discussed and let me know if I can assist you in the future.   These are the goals we discussed: Goals    None      This is a list of the screening recommended for you and due dates:  Health Maintenance  Topic Date Due  . Shingles Vaccine  02/23/2020*  . Flu Shot  10/22/2015  . Tetanus Vaccine  09/16/2024  . DEXA scan (bone density measurement)  Completed  . Pneumonia vaccines  Completed  *Topic was postponed. The date shown is not the original due date.    Test(s) ordered today. Your results will be released to [place] (or called to you) after review, usually within 72hours after test completion. If any changes need to be made, you will be notified at that same time.  All other Health Maintenance issues reviewed.   All recommended immunizations and age-appropriate screenings are up-to-date or discussed.  No immunizations administered today.   Medications reviewed and updated.  No changes recommended at this time.  Your prescription(s) have been submitted to your pharmacy. Please take as directed and contact our office if you believe you are having problem(s) with the medication(s).   Please followup in 6 months

## 2015-09-10 NOTE — Assessment & Plan Note (Signed)
BP well controlled Current regimen effective and well tolerated Continue current medications at current doses cmp,tsh 

## 2015-09-10 NOTE — Assessment & Plan Note (Signed)
Following with neuro Taking risperdal for behavioral disturbances

## 2015-09-10 NOTE — Assessment & Plan Note (Signed)
Only on ASA, not an anticoag candidate due to risk> benefit HR well controlled Following with cardio

## 2015-09-10 NOTE — Assessment & Plan Note (Signed)
Asymptomatic Sees cardiology Continue asa and statin

## 2015-09-10 NOTE — Progress Notes (Signed)
Subjective:    Patient ID: [name], female    DOB: [birth year], 82 y.o.   MRN: [id number]  HPI Here for medicare wellness exam.   I have personally reviewed and have noted 1.The patient's medical and social history 2.Their use of alcohol, tobacco or illicit drugs 3.Their current medications and supplements 4.The patient's functional ability including ADL's, fall risks, home safety risks and                 hearing or visual impairment. 5.Diet and physical activities 6.Evidence for depression or mood disorders 7.Care team reviewed and updated cardiology - Dr Rayann Heman, neurology - Dr Rozell Searing   Are there smokers in your home (other than you)? No  Risk Factors Exercise: none Dietary issues discussed: she cooks, eats pretty healthy, out to eat once in a while  Cardiac risk factors: advanced age, hypertension, hyperlipidemia, and obesity (BMI >= 30 kg/m2).  Depression Screen  Have you felt down, depressed or hopeless? No  Have you felt little interest or pleasure in doing things?  No  Activities of Daily Living In your present state of health, do you have any difficulty performing the following activities?:  Driving? Yes - no longer drives Managing money?  No Feeding yourself? No Getting from bed to chair? No Climbing a flight of stairs? No Preparing food and eating?: No Bathing or showering? No Getting dressed: No Getting to/using the toilet? No Moving around from place to place: yes, - uses walker In the past year have you fallen or had a near fall?: No   Are you sexually active?  No  Do you have more than one partner?  N/A  Hearing Difficulties: yes - wears hearing aids  Vision:              Any change in vision: no              Up to date with eye exam:  yes  Memory:  Do you feel that you have a problem with memory? Yes, diagnosed with dementia -                 follows with neurology  Do you feel  safe at home?  Yes    Advanced Directives have been discussed with the patient? Yes  Medications and allergies reviewed with patient and updated if appropriate.  Patient Active Problem List   Diagnosis Date Noted  . Pacemaker-St.Jude 01/13/2012  . Second degree Mobitz II AV block 01/11/2012  . CAROTID STENOSIS 11/29/2008  . HEARING LOSS 10/12/2008  . Osteoporosis 05/08/2008  . AORTIC VALVE REPLACEMENT, HX OF 05/08/2008  . GERD 04/09/2008  . RENAL INSUFFICIENCY 06/01/2007  . DEGENERATIVE JOINT DISEASE 06/01/2007  . Hyperlipidemia 04/13/2007  . Mild dementia 04/13/2007  . Coronary atherosclerosis 04/13/2007  . Paroxysmal atrial fibrillation ([employer]) 04/13/2007  . DIVERTICULOSIS OF COLON 04/13/2007  . Anxiety state 01/18/2007  . Essential hypertension 01/18/2007    Current Outpatient Prescriptions on File Prior to Visit  Medication Sig Dispense Refill  . acetaminophen (TYLENOL) 325 MG tablet Take 650 mg by mouth every 6 (six) hours as needed (pain).     Marland Kitchen amLODipine (NORVASC) 5 MG tablet Take 1 tablet (5 mg total) by mouth daily. 90 tablet 3  . aspirin 325 MG EC tablet Take 325 mg by mouth daily with breakfast.    . betamethasone dipropionate 0.05 % lotion Apply 1 application topically 2 (two) times daily as needed (psoriasis).  60 mL 0  .  calcium carbonate (TUMS - DOSED IN MG ELEMENTAL CALCIUM) 500 MG chewable tablet Chew 2 tablets by mouth daily as needed for heartburn.     . cholecalciferol (VITAMIN D) 1000 UNITS tablet Take 1,000 Units by mouth daily with breakfast.     . citalopram (CELEXA) 20 MG tablet Take 1 tablet (20 mg total) by mouth daily at 6 PM. Per dr Casimiro Needle    . folic acid (FOLVITE) 1 MG tablet Take 1 tablet (1 mg total) by mouth daily. Keep June appt w/new MD for refills 90 tablet 0  . furosemide (LASIX) 40 MG tablet Take 2 tablets (80 mg total) by mouth daily. 60 tablet 5  . mineral oil enema Place 1 enema rectally daily as needed (Bowel health).     . polyethylene  glycol powder (MIRALAX) powder Take 17 g by mouth daily as needed (constipation).     . pravastatin (PRAVACHOL) 40 MG tablet Take 1 tablet (40 mg total) by mouth at bedtime. 90 tablet 3  . risperiDONE (RISPERDAL) 0.25 MG tablet Take 1 tablet by mouth at bedtime.    . senna-docusate (SENOKOT-S) 8.6-50 MG per tablet Take 2 tablets by mouth daily as needed for mild constipation.      No current facility-administered medications on file prior to visit.    Past Medical History  Diagnosis Date  . Unspecified chronic bronchitis ([place])   . HTN (hypertension)   . CAD (coronary artery disease)     s/p CABG  . S/P AVR (aortic valve replacement) 2003    bioprosthetic   . Atrial fibrillation ([facility])     not anticoagulation candidate due to falls  . Carotid artery stenosis   . Hyperlipidemia   . Esophageal reflux   . Diverticulosis of colon (without mention of hemorrhage)   . Osteoarthrosis, unspecified whether generalized or localized, unspecified site   . Osteoporosis, unspecified   . Dementia   . Anxiety state, unspecified   . Anemia, unspecified   . Complete heart block ([place]) 12/2011    s/p St.Jude dual chamber PPM 01/12/12  . Thoracic aortic aneurysm ([place]) 2003    s/p repair  . Pacemaker-St.Jude 01/13/2012    Past Surgical History  Procedure Laterality Date  . Total hip arthroplasty  1998    left  . Vesicovaginal fistula closure w/ tah    . Intraocular lens insertion      bilateral  . Coronary artery bypass graft    . Aortic valve replacement  2003  . Cataract extraction      bilateral  . Pacemaker insertion  01/12/12    SJM Accent DR RF implanted by Dr Rayann Heman for CHB  . Permanent pacemaker insertion N/A 01/12/2012    Procedure: PERMANENT PACEMAKER INSERTION;  Surgeon: Thompson Grayer, MD;  Location: [facility] CATH LAB;  Service: Cardiovascular;  Laterality: N/A;    Social History   Social History  . Marital Status: Married    Spouse Name: [name]  . Number of Children: N/A  . Years  of Education: N/A   Occupational History  . retired    Social History Main Topics  . Smoking status: Never Smoker   . Smokeless tobacco: Never Used  . Alcohol Use: No  . Drug Use: No  . Sexual Activity: Not on file   Other Topics Concern  . Not on file   Social History Narrative   Lives with husband. Mother had kidney problems.  Father had coronary artery  disease.  Both died when the patient was  young.  She does not know how old  they were.  She has one brother who died at age 71 of a CVA, although, he  had coronary artery disease and diabetes.  Another brother died at age 72 of  a stroke.  He also had coronary artery disease.  One sister is living, she  has coronary artery disease and diabetes and one sister has anginal symptoms. Her sister has a pacemaker (history palpitations).           Family History  Problem Relation Age of Onset  . Diabetes Brother   . Diabetes Sister     Review of Systems  Constitutional: Negative for fever, chills, appetite change and unexpected weight change.  HENT: Negative for hearing loss and tinnitus.   Eyes: Negative for visual disturbance.  Respiratory: Negative for cough, shortness of breath and wheezing.   Cardiovascular: Negative for chest pain, palpitations and leg swelling.  Gastrointestinal: Negative for nausea, abdominal pain, diarrhea, constipation and blood in stool.       No gerd  Genitourinary: Negative for dysuria and hematuria.  Musculoskeletal: Positive for gait problem (walker).       Right leg pain  Skin: Negative for color change and rash.  Neurological: Negative for dizziness, light-headedness, numbness and headaches.  Psychiatric/Behavioral: Negative for dysphoric mood. The patient is not nervous/anxious.        Objective:   Filed Vitals:   09/10/15 1048  BP: 124/82  Pulse: 79  Temp: 98.6 F (37 C)  Resp: 16   Filed Weights   09/10/15 1048  Weight: 174 lb (78.926 kg)   Body mass index is 31.3 kg/(m^2).    Physical Exam Constitutional: She appears well-developed and well-nourished. No distress.  HENT:  Head: Normocephalic and atraumatic.  Right Ear: External ear normal. Normal ear canal and TM Left Ear: External ear normal.  Normal ear canal and TM Mouth/Throat: Oropharynx is clear and moist.  Eyes: Conjunctivae and EOM are normal.  Neck: Neck supple. No tracheal deviation present. No thyromegaly present.  No carotid bruit  Cardiovascular: Normal rate, regular rhythm and normal heart sounds.   No murmur heard.  No edema. Pulmonary/Chest: Effort normal and breath sounds normal. No respiratory distress. She has no wheezes. She has no rales.  Abdominal: Soft. She exhibits no distension. There is no tenderness.  Lymphadenopathy: She has no cervical adenopathy.  Skin: Skin is warm and dry. She is not diaphoretic.  Psychiatric: She has a normal mood and affect. Her behavior is normal.        Assessment & Plan:   Wellness Exam: Immunizations  Up to date  Colonoscopy  Not needed at this age 37  Not needed at this age Dexa  Up to date  Gyn  Not needed at this age Eye exam  Up to date  Hearing loss  - yes, wears hearing aids Memory concerns/difficulties  - yes, has dementia - following with a specialist Independent of ADLs - no, not driving, otherwise independent    Patient received copy of preventative screening tests/immunizations recommended for the next 5-10 years.  See Problem List for Assessment and Plan of chronic medical problems.   F/u in 6 months

## 2015-09-11 ENCOUNTER — Other Ambulatory Visit (INDEPENDENT_AMBULATORY_CARE_PROVIDER_SITE_OTHER): Payer: Medicare Other

## 2015-09-11 DIAGNOSIS — E785 Hyperlipidemia, unspecified: Secondary | ICD-10-CM

## 2015-09-11 DIAGNOSIS — I10 Essential (primary) hypertension: Secondary | ICD-10-CM

## 2015-09-11 DIAGNOSIS — N183 Chronic kidney disease, stage 3 unspecified: Secondary | ICD-10-CM

## 2015-09-11 LAB — CBC WITH DIFFERENTIAL/PLATELET
BASOS PCT: 0.6 % (ref 0.0–3.0)
Basophils Absolute: 0 10*3/uL (ref 0.0–0.1)
EOS PCT: 2.7 % (ref 0.0–5.0)
Eosinophils Absolute: 0.2 10*3/uL (ref 0.0–0.7)
HEMATOCRIT: 39.1 % (ref 36.0–46.0)
HEMOGLOBIN: 12.9 g/dL (ref 12.0–15.0)
Lymphocytes Relative: 18.5 % (ref 12.0–46.0)
Lymphs Abs: 1.1 10*3/uL (ref 0.7–4.0)
MCHC: 32.9 g/dL (ref 30.0–36.0)
MCV: 89.5 fl (ref 78.0–100.0)
MONOS PCT: 8.3 % (ref 3.0–12.0)
Monocytes Absolute: 0.5 10*3/uL (ref 0.1–1.0)
Neutro Abs: 4.1 10*3/uL (ref 1.4–7.7)
Neutrophils Relative %: 69.9 % (ref 43.0–77.0)
Platelets: 231 10*3/uL (ref 150.0–400.0)
RBC: 4.37 Mil/uL (ref 3.87–5.11)
RDW: 15.9 % — AB (ref 11.5–15.5)
WBC: 5.8 10*3/uL (ref 4.0–10.5)

## 2015-09-11 LAB — COMPREHENSIVE METABOLIC PANEL
ALT: 7 U/L (ref 0–35)
AST: 12 U/L (ref 0–37)
Albumin: 3.9 g/dL (ref 3.5–5.2)
Alkaline Phosphatase: 78 U/L (ref 39–117)
BUN: 24 mg/dL — ABNORMAL HIGH (ref 6–23)
CO2: 31 mEq/L (ref 19–32)
Calcium: 9.5 mg/dL (ref 8.4–10.5)
Chloride: 101 mEq/L (ref 96–112)
Creatinine, Ser: 1.2 mg/dL (ref 0.40–1.20)
GFR: 44.87 mL/min — ABNORMAL LOW (ref >60.00)
Glucose, Bld: 89 mg/dL (ref 70–99)
Potassium: 4.1 mEq/L (ref 3.5–5.1)
Sodium: 141 mEq/L (ref 135–145)
Total Bilirubin: 0.4 mg/dL (ref 0.2–1.2)
Total Protein: 6.8 g/dL (ref 6.0–8.3)

## 2015-09-11 LAB — LIPID PANEL
CHOL/HDL RATIO: 4
Cholesterol: 161 mg/dL (ref 0–200)
HDL: 43.5 mg/dL (ref >39.00)
LDL CALC: 81 mg/dL (ref 0–99)
NONHDL: 117.18
Triglycerides: 182 mg/dL — ABNORMAL HIGH (ref 0.0–149.0)
VLDL: 36.4 mg/dL (ref 0.0–40.0)

## 2015-09-11 LAB — TSH: TSH: 1.55 u[IU]/mL (ref 0.35–4.50)

## 2015-09-12 ENCOUNTER — Encounter: Payer: Self-pay | Admitting: Cardiology

## 2015-09-16 ENCOUNTER — Encounter: Payer: Self-pay | Admitting: Emergency Medicine

## 2015-09-17 ENCOUNTER — Other Ambulatory Visit: Payer: Self-pay | Admitting: Internal Medicine

## 2015-11-08 ENCOUNTER — Encounter: Payer: Self-pay | Admitting: Nurse Practitioner

## 2015-11-28 ENCOUNTER — Ambulatory Visit (INDEPENDENT_AMBULATORY_CARE_PROVIDER_SITE_OTHER): Payer: Medicare Other

## 2015-11-28 DIAGNOSIS — Z23 Encounter for immunization: Secondary | ICD-10-CM | POA: Diagnosis not present

## 2015-12-06 ENCOUNTER — Encounter: Payer: Self-pay | Admitting: Nurse Practitioner

## 2015-12-09 NOTE — Progress Notes (Signed)
HPI: FU coronary disease status post bypassing graft, history of thoracic aortic aneurysm repair, as well as aortic valve replacement and paroxysmal atrial fibrillation. Last Myoview was performed on September 02, 2006. At that time she had normal perfusion with an ejection fraction of 76%. Carotid Dopplers performed in Sept 2011 revealed normal carotids. Note she has also had problems with dementia with psychotic features. The patient was found to have symptomatic high degree AV block in October of 2013 and had a pacemaker placed. Echocardiogram April 2014 showed normal LV function, moderate left ventricular hypertrophy, bioprosthetic aortic valve with a mean gradient of 20 mmHg and mild left atrial enlargement. Since last seen, patient denies dyspnea, chest pain, palpitations or syncope.  Current Outpatient Prescriptions  Medication Sig Dispense Refill  . acetaminophen (TYLENOL) 325 MG tablet Take 650 mg by mouth every 6 (six) hours as needed (pain).     Marland Kitchen amLODipine (NORVASC) 5 MG tablet Take 1 tablet (5 mg total) by mouth daily. 90 tablet 3  . aspirin 325 MG EC tablet Take 325 mg by mouth daily with breakfast.    . betamethasone dipropionate 0.05 % lotion Apply 1 application topically 2 (two) times daily as needed (psoriasis).  60 mL 0  . calcium carbonate (TUMS - DOSED IN MG ELEMENTAL CALCIUM) 500 MG chewable tablet Chew 2 tablets by mouth daily as needed for heartburn.     . cholecalciferol (VITAMIN D) 1000 UNITS tablet Take 1,000 Units by mouth daily with breakfast.     . citalopram (CELEXA) 20 MG tablet Take 1 tablet (20 mg total) by mouth daily at 6 PM. Per dr Casimiro Needle    . folic acid (FOLVITE) 1 MG tablet TAKE ONE TABLET BY MOUTH ONCE DAILY 90 tablet 1  . furosemide (LASIX) 40 MG tablet Take 2 tablets (80 mg total) by mouth daily. 60 tablet 5  . mineral oil enema Place 1 enema rectally daily as needed (Bowel health).     . polyethylene glycol powder (MIRALAX) powder Take 17 g by mouth  daily as needed (constipation).     . pravastatin (PRAVACHOL) 40 MG tablet Take 1 tablet (40 mg total) by mouth at bedtime. 90 tablet 3  . risperiDONE (RISPERDAL) 0.25 MG tablet Take 1 tablet by mouth at bedtime.    . senna-docusate (SENOKOT-S) 8.6-50 MG per tablet Take 2 tablets by mouth daily as needed for mild constipation.      No current facility-administered medications for this visit.      Past Medical History:  Diagnosis Date  . Anemia, unspecified   . Anxiety state, unspecified   . Atrial fibrillation (HCC)    not anticoagulation candidate due to falls  . CAD (coronary artery disease)    s/p CABG  . Carotid artery stenosis   . Complete heart block ([place]) 12/2011   s/p St.Jude dual chamber PPM 01/12/12  . Dementia   . Diverticulosis of colon (without mention of hemorrhage)   . Esophageal reflux   . HTN (hypertension)   . Hyperlipidemia   . Osteoarthrosis, unspecified whether generalized or localized, unspecified site   . Osteoporosis, unspecified   . Pacemaker-St.Jude 01/13/2012  . S/P AVR (aortic valve replacement) 2003   bioprosthetic   . Thoracic aortic aneurysm ([place]) 2003   s/p repair  . Unspecified chronic bronchitis ([place])     Past Surgical History:  Procedure Laterality Date  . AORTIC VALVE REPLACEMENT  2003  . CATARACT EXTRACTION     bilateral  .  CORONARY ARTERY BYPASS GRAFT    . INTRAOCULAR LENS INSERTION     bilateral  . PACEMAKER INSERTION  01/12/12   SJM Accent DR RF implanted by Dr Rayann Heman for CHB  . PERMANENT PACEMAKER INSERTION N/A 01/12/2012   Procedure: PERMANENT PACEMAKER INSERTION;  Surgeon: Thompson Grayer, MD;  Location: [facility] CATH LAB;  Service: Cardiovascular;  Laterality: N/A;  . [place]   left  . VESICOVAGINAL FISTULA CLOSURE W/ TAH      Social History   Social History  . Marital status: Married    Spouse name: [name]  . Number of children: N/A  . Years of education: N/A   Occupational History  . retired     Social History Main Topics  . Smoking status: Never Smoker  . Smokeless tobacco: Never Used  . Alcohol use No  . Drug use: No  . Sexual activity: Not on file   Other Topics Concern  . Not on file   Social History Narrative   Lives with husband. Mother had kidney problems.  Father had coronary artery  disease.  Both died when the patient was young.  She does not know how old  they were.  She has one brother who died at age 22 of a CVA, although, he  had coronary artery disease and diabetes.  Another brother died at age 87 of  a stroke.  He also had coronary artery disease.  One sister is living, she  has coronary artery disease and diabetes and one sister has anginal symptoms. Her sister has a pacemaker (history palpitations).           Family History  Problem Relation Age of Onset  . Diabetes Brother   . Diabetes Sister     ROS: no fevers or chills, productive cough, hemoptysis, dysphasia, odynophagia, melena, hematochezia, dysuria, hematuria, rash, seizure activity, orthopnea, PND, pedal edema, claudication. Remaining systems are negative.  Physical Exam: Well-developed well-nourished in no acute distress.  Skin is warm and dry.  HEENT is normal.  Neck is supple.  Chest is clear to auscultation with normal expansion.  Cardiovascular exam is regular rate and rhythm.  Abdominal exam nontender or distended. No masses palpated. Extremities show no edema. neuro grossly intact  ECG-Sinus rhythm with ventricular pacing.  A/P  1 hyperlipidemia-continue statin. Lipids and liver monitored by primary care.  2 hypertension-blood pressure controlled. Continue present medications.  3 coronary artery disease-continue aspirin and statin.  4 paroxysmal atrial fibrillation-patient remains in sinus rhythm. She is not felt to be a good candidate for anticoagulation as she has fallen and had some confusion in the past.  5 aortic valve replacement-continue SBE prophylaxis.  6  pacemaker-followed by electrophysiology.  Kirk Ruths, MD

## 2015-12-11 ENCOUNTER — Ambulatory Visit (INDEPENDENT_AMBULATORY_CARE_PROVIDER_SITE_OTHER): Payer: Medicare Other | Admitting: Cardiology

## 2015-12-11 ENCOUNTER — Encounter: Payer: Self-pay | Admitting: Cardiology

## 2015-12-11 VITALS — BP 146/72 | HR 84 | Ht 61.0 in | Wt 172.0 lb

## 2015-12-11 DIAGNOSIS — I4891 Unspecified atrial fibrillation: Secondary | ICD-10-CM

## 2015-12-11 DIAGNOSIS — Z950 Presence of cardiac pacemaker: Secondary | ICD-10-CM

## 2015-12-11 DIAGNOSIS — I10 Essential (primary) hypertension: Secondary | ICD-10-CM

## 2015-12-11 DIAGNOSIS — I2510 Atherosclerotic heart disease of native coronary artery without angina pectoris: Secondary | ICD-10-CM

## 2015-12-11 DIAGNOSIS — I480 Paroxysmal atrial fibrillation: Secondary | ICD-10-CM

## 2015-12-11 DIAGNOSIS — E785 Hyperlipidemia, unspecified: Secondary | ICD-10-CM

## 2015-12-11 NOTE — Patient Instructions (Signed)
Your physician wants you to follow-up in: ONE YEAR WITH DR CRENSHAW You will receive a reminder letter in the mail two months in advance. If you don't receive a letter, please call our office to schedule the follow-up appointment.   If you need a refill on your cardiac medications before your next appointment, please call your pharmacy.  

## 2015-12-18 ENCOUNTER — Encounter: Payer: Medicare Other | Admitting: Nurse Practitioner

## 2015-12-18 NOTE — Progress Notes (Signed)
Electrophysiology Office Note Date: 12/19/2015  ID:  [name], [name] 12-Feb-1924, MRN [id number]  PCP: [name] Rail, MD Primary Cardiologist: [name] Electrophysiologist: Allred  CC: Pacemaker follow-up  [name] is a 82 y.o. female seen today for Dr [name].  She presents today for routine electrophysiology followup.  Since last being seen in our clinic, the patient reports doing very well. She has chronic LE edema but denies chest pain, palpitations, dyspnea, PND, orthopnea, nausea, vomiting, dizziness, syncope, weight gain, or early satiety. She says she is "in tip top shape".   Device History: STJ dual chamber PPM implanted 2013 for complete heart block    Past Medical History:  Diagnosis Date  . Anemia, unspecified   . Anxiety state, unspecified   . Atrial fibrillation (HCC)    not anticoagulation candidate due to falls  . CAD (coronary artery disease)    s/p CABG  . Carotid artery stenosis   . Complete heart block ([place]) 12/2011   s/p St.Jude dual chamber PPM 01/12/12  . Dementia   . Diverticulosis of colon (without mention of hemorrhage)   . Esophageal reflux   . HTN (hypertension)   . Hyperlipidemia   . Osteoarthrosis, unspecified whether generalized or localized, unspecified site   . Osteoporosis, unspecified   . Pacemaker-St.Jude 01/13/2012  . S/P AVR (aortic valve replacement) 2003   bioprosthetic   . Thoracic aortic aneurysm ([place]) 2003   s/p repair  . Unspecified chronic bronchitis ([place])    Past Surgical History:  Procedure Laterality Date  . AORTIC VALVE REPLACEMENT  2003  . CATARACT EXTRACTION     bilateral  . CORONARY ARTERY BYPASS GRAFT    . INTRAOCULAR LENS INSERTION     bilateral  . PACEMAKER INSERTION  01/12/12   SJM Accent DR RF implanted by Dr [name] for CHB  . PERMANENT PACEMAKER INSERTION N/A 01/12/2012   Procedure: PERMANENT PACEMAKER INSERTION;  Surgeon: Thompson Grayer, MD;  Location: [facility] CATH LAB;  Service: Cardiovascular;  Laterality:  N/A;  . [place]   left  . VESICOVAGINAL FISTULA CLOSURE W/ TAH      Current Outpatient Prescriptions  Medication Sig Dispense Refill  . acetaminophen (TYLENOL) 325 MG tablet Take 650 mg by mouth every 6 (six) hours as needed (pain).     Marland Kitchen amLODipine (NORVASC) 5 MG tablet Take 1 tablet (5 mg total) by mouth daily. 90 tablet 3  . aspirin 325 MG EC tablet Take 325 mg by mouth daily with breakfast.    . betamethasone dipropionate 0.05 % lotion Apply 1 application topically 2 (two) times daily as needed (psoriasis).  60 mL 0  . calcium carbonate (TUMS - DOSED IN MG ELEMENTAL CALCIUM) 500 MG chewable tablet Chew 2 tablets by mouth daily as needed for heartburn.     . cholecalciferol (VITAMIN D) 1000 UNITS tablet Take 1,000 Units by mouth daily with breakfast.     . citalopram (CELEXA) 20 MG tablet Take 1 tablet (20 mg total) by mouth daily at 6 PM. Per dr Casimiro Needle    . folic acid (FOLVITE) 1 MG tablet TAKE ONE TABLET BY MOUTH ONCE DAILY 90 tablet 1  . furosemide (LASIX) 40 MG tablet Take 2 tablets (80 mg total) by mouth daily. 60 tablet 5  . mineral oil enema Place 1 enema rectally daily as needed (Bowel health).     . polyethylene glycol powder (MIRALAX) powder Take 17 g by mouth daily as needed (constipation).     Marland Kitchen  pravastatin (PRAVACHOL) 40 MG tablet Take 1 tablet (40 mg total) by mouth at bedtime. 90 tablet 3  . risperiDONE (RISPERDAL) 0.25 MG tablet Take 1 tablet by mouth at bedtime.    . senna-docusate (SENOKOT-S) 8.6-50 MG per tablet Take 2 tablets by mouth daily as needed for mild constipation.      No current facility-administered medications for this visit.     Allergies:   Coumadin [warfarin sodium]; Clarithromycin; Iohexol; Nsaids; Penicillins; Prednisone; Statins; and Sulfonamide derivatives   Social History: Social History   Social History  . Marital status: Married    Spouse name: Jenny [name]  . Number of children: N/A  . Years of education: N/A    Occupational History  . retired    Social History Main Topics  . Smoking status: Never Smoker  . Smokeless tobacco: Never Used  . Alcohol use No  . Drug use: No  . Sexual activity: Not on file   Other Topics Concern  . Not on file   Social History Narrative   Lives with husband. Mother had kidney problems.  Father had coronary artery  disease.  Both died when the patient was young.  She does not know how old  they were.  She has one brother who died at age 55 of a CVA, although, he  had coronary artery disease and diabetes.  Another brother died at age 75 of  a stroke.  He also had coronary artery disease.  One sister is living, she  has coronary artery disease and diabetes and one sister has anginal symptoms. Her sister has a pacemaker (history palpitations).           Family History: Family History  Problem Relation Age of Onset  . Diabetes Brother   . Diabetes Sister     Review of Systems: All other systems reviewed and are otherwise negative except as noted above.   Physical Exam: VS:  BP 110/68   Pulse 82   Ht 5\' 1"  (1.549 m)   Wt 176 lb 3.2 oz (79.9 kg)   SpO2 97%   BMI 33.29 kg/m  , BMI Body mass index is 33.29 kg/m.  GEN- The patient is elderly appearing, alert and oriented x 3 today.   HEENT: normocephalic, atraumatic; sclera clear, conjunctiva pink; hearing intact; oropharynx clear; neck supple  Lungs- Clear to ausculation bilaterally, normal work of breathing.  No wheezes, rales, rhonchi Heart- Regular rate and rhythm (paced) GI- soft, non-tender, non-distended, bowel sounds present  Extremities- no clubbing, cyanosis, +dependent edema MS- no significant deformity or atrophy Skin- warm and dry, no rash or lesion; PPM pocket well healed Psych- euthymic mood, full affect Neuro- strength and sensation are intact  PPM Interrogation- reviewed in detail today,  See PACEART report  EKG:  EKG is not ordered today.  Recent Labs: 09/11/2015: ALT 7; BUN 24;  Creatinine, Ser 1.20; Hemoglobin 12.9; Platelets 231.0; Potassium 4.1; Sodium 141; TSH 1.55   Wt Readings from Last 3 Encounters:  12/19/15 176 lb 3.2 oz (79.9 kg)  12/11/15 172 lb (78 kg)  09/10/15 174 lb (78.9 kg)     Other studies Reviewed: Additional studies/ records that were reviewed today include: Dr [name] and Dr Jacalyn Lefevre office notes  Assessment and Plan:  1.  Mobitz II heart block Normal PPM function See Pace Art report No changes today  2.  HTN Stable No change required today  3.  CAD  No recent ischemic symptoms Continue current therapy  4.  Paroxysmal atrial  fibrillation Burden by device interrogation <1% V rates controlled Not felt to be a candidate for [facility] given advanced age and falls CHADS2VASC is 5  5.  S/p AVR Followed by Dr [name]    Current medicines are reviewed at length with the patient today.   The patient does not have concerns regarding her medicines.  The following changes were made today:  none  Labs/ tests ordered today include: none No orders of the defined types were placed in this encounter.    Disposition:   Follow up with Delilah Shan, Dr [name] 1 year    Signed, Chanetta Marshall, NP 12/19/2015 11:29 AM  [place] [address] [place] [address] [phone] (office) [phone] (fax)

## 2015-12-19 ENCOUNTER — Ambulatory Visit (INDEPENDENT_AMBULATORY_CARE_PROVIDER_SITE_OTHER): Payer: Medicare Other | Admitting: Nurse Practitioner

## 2015-12-19 ENCOUNTER — Encounter: Payer: Self-pay | Admitting: Internal Medicine

## 2015-12-19 ENCOUNTER — Encounter: Payer: Self-pay | Admitting: Nurse Practitioner

## 2015-12-19 DIAGNOSIS — I2510 Atherosclerotic heart disease of native coronary artery without angina pectoris: Secondary | ICD-10-CM

## 2015-12-19 DIAGNOSIS — I441 Atrioventricular block, second degree: Secondary | ICD-10-CM

## 2015-12-19 NOTE — Patient Instructions (Signed)
Medication Instructions:  Your physician recommends that you continue on your current medications as directed. Please refer to the Current Medication list given to you today.   Labwork: None ordered   Testing/Procedures: None ordered   Follow-Up: Remote monitoring is used to monitor your Pacemaker  from home. This monitoring reduces the number of office visits required to check your device to one time per year. It allows [place] to keep an eye on the functioning of your device to ensure it is working properly. You are scheduled for a device check from home on 03/19/16. You may send your transmission at any time that day. If you have a wireless device, the transmission will be sent automatically. After your physician reviews your transmission, you will receive a postcard with your next transmission date.      Your physician wants you to follow-up in: 12 months with Dr Vallery Ridge will receive a reminder letter in the mail two months in advance. If you don't receive a letter, please call our office to schedule the follow-up appointment.   Any Other Special Instructions Will Be Listed Below (If Applicable).     If you need a refill on your cardiac medications before your next appointment, please call your pharmacy.

## 2016-01-07 ENCOUNTER — Telehealth: Payer: Self-pay | Admitting: *Deleted

## 2016-01-07 MED ORDER — LORAZEPAM 0.5 MG PO TABS: 0.2500 mg | ORAL_TABLET | Freq: Two times a day (BID) | ORAL | 0 refills | Status: DC | PRN

## 2016-01-07 NOTE — Telephone Encounter (Signed)
Husband left msg on triage stating wife sister pass yesterday and she is having a heard time dealing with it. Wanting to know if MD can rx something to calm her down for this short time...[name]

## 2016-01-07 NOTE — Telephone Encounter (Signed)
Husband returned call gave him MD response. Faxed script over to [facility],,,/lmb

## 2016-01-07 NOTE — Telephone Encounter (Signed)
Sorry for your loss.  We can try a low dose ativan for a short time - ideally should not take long term.  This should calm her down, but may cause some drowsiness so the first time she takes it someone should monitor her.  Call with questions.  rx printed

## 2016-01-07 NOTE — Telephone Encounter (Signed)
Called pt no answer LMOM RTC.../lmb 

## 2016-02-12 DIAGNOSIS — F329 Major depressive disorder, single episode, unspecified: Secondary | ICD-10-CM | POA: Diagnosis not present

## 2016-02-17 ENCOUNTER — Other Ambulatory Visit: Payer: Self-pay | Admitting: Internal Medicine

## 2016-03-11 ENCOUNTER — Ambulatory Visit (INDEPENDENT_AMBULATORY_CARE_PROVIDER_SITE_OTHER): Payer: Medicare Other | Admitting: Internal Medicine

## 2016-03-11 ENCOUNTER — Encounter: Payer: Self-pay | Admitting: Internal Medicine

## 2016-03-11 ENCOUNTER — Other Ambulatory Visit (INDEPENDENT_AMBULATORY_CARE_PROVIDER_SITE_OTHER): Payer: Medicare Other

## 2016-03-11 VITALS — BP 142/84 | HR 83 | Temp 98.1°F | Resp 16 | Wt 179.0 lb

## 2016-03-11 DIAGNOSIS — I2510 Atherosclerotic heart disease of native coronary artery without angina pectoris: Secondary | ICD-10-CM

## 2016-03-11 DIAGNOSIS — N183 Chronic kidney disease, stage 3 unspecified: Secondary | ICD-10-CM

## 2016-03-11 DIAGNOSIS — F411 Generalized anxiety disorder: Secondary | ICD-10-CM

## 2016-03-11 DIAGNOSIS — F0390 Unspecified dementia without behavioral disturbance: Secondary | ICD-10-CM | POA: Diagnosis not present

## 2016-03-11 DIAGNOSIS — I10 Essential (primary) hypertension: Secondary | ICD-10-CM

## 2016-03-11 DIAGNOSIS — E7800 Pure hypercholesterolemia, unspecified: Secondary | ICD-10-CM

## 2016-03-11 DIAGNOSIS — F03A0 Unspecified dementia, mild, without behavioral disturbance, psychotic disturbance, mood disturbance, and anxiety: Secondary | ICD-10-CM

## 2016-03-11 DIAGNOSIS — I480 Paroxysmal atrial fibrillation: Secondary | ICD-10-CM

## 2016-03-11 LAB — COMPREHENSIVE METABOLIC PANEL
ALT: 7 U/L (ref 0–35)
AST: 12 U/L (ref 0–37)
Albumin: 4.1 g/dL (ref 3.5–5.2)
Alkaline Phosphatase: 84 U/L (ref 39–117)
BUN: 26 mg/dL — ABNORMAL HIGH (ref 6–23)
CHLORIDE: 100 meq/L (ref 96–112)
CO2: 28 meq/L (ref 19–32)
Calcium: 9.4 mg/dL (ref 8.4–10.5)
Creatinine, Ser: 1.3 mg/dL — ABNORMAL HIGH (ref 0.40–1.20)
GFR: 40.87 mL/min — AB (ref >60.00)
Glucose, Bld: 119 mg/dL — ABNORMAL HIGH (ref 70–99)
Potassium: 4 mEq/L (ref 3.5–5.1)
Sodium: 138 mEq/L (ref 135–145)
Total Bilirubin: 0.5 mg/dL (ref 0.2–1.2)
Total Protein: 7 g/dL (ref 6.0–8.3)

## 2016-03-11 NOTE — Assessment & Plan Note (Signed)
Has had some aggitation in the past - take risperadol No longer seeing Dr Casimiro Needle Will continue current meds I will prescribe them from here Doing well on continue medications at doses

## 2016-03-11 NOTE — Assessment & Plan Note (Signed)
No longer seeing Dr Casimiro Needle Will continue current meds I will prescribe them from here Doing well on continue medications at doses

## 2016-03-11 NOTE — Patient Instructions (Addendum)
  Test(s) ordered today. Your results will be released to MyChart (or called to you) after review, usually within 72hours after test completion. If any changes need to be made, you will be notified at that same time.  No immunizations administered today.   Medications reviewed and updated.  No changes recommended at this time.  Please followup in 6 months   

## 2016-03-11 NOTE — Assessment & Plan Note (Signed)
HR controlled Asymptomatic On ASA - no other a/c due to risk > benefit Sees cardio annually

## 2016-03-11 NOTE — Progress Notes (Signed)
Pre visit review using our clinic review tool, if applicable. No additional management support is needed unless otherwise documented below in the visit note. 

## 2016-03-11 NOTE — Assessment & Plan Note (Signed)
Continue statin. 

## 2016-03-11 NOTE — Progress Notes (Signed)
Subjective:    Patient ID: [name], female    DOB: 08-29-1923, 82 y.o.   MRN: [id number]  HPI She is here for follow up.  CAD Afib, Hypertension: She is taking her medication daily. She is compliant with a low sodium diet.  She denies chest pain, palpitations, shortness of breath and regular headaches. She has chronic leg edema and it does not bother her.  She is not exercising regularly.  She does not monitor her blood pressure at home.    Hyperlipidemia: She is taking her medication daily. She is compliant with a low fat/cholesterol diet. She is not exercising regularly. She denies myalgias.   Dementia with behavioral disturbance, Anxiety: She is taking her medication daily as prescribed. She denies any side effects from the medication. She feels her anxiety is well controlled and she is happy with her current dose of medication.   CKD: she denies changes in her urination.  She odes not take any advil or ibuprofen.   Dementia:  She is not following with neurology.her and her husband deny any concerns regarding her memory.      Medications and allergies reviewed with patient and updated if appropriate.  Patient Active Problem List   Diagnosis Date Noted  . CKD (chronic kidney disease) stage 3, GFR 30-59 ml/min 09/10/2015  . Arthritis of right hip, severe 09/10/2015  . Pacemaker-St.Jude 01/13/2012  . Second degree Mobitz II AV block 01/11/2012  . CAROTID STENOSIS 11/29/2008  . Hearing loss 10/12/2008  . Osteoporosis 05/08/2008  . AORTIC VALVE REPLACEMENT, HX OF 05/08/2008  . Hyperlipidemia 04/13/2007  . Mild dementia 04/13/2007  . Coronary atherosclerosis 04/13/2007  . Paroxysmal atrial fibrillation ([place]) 04/13/2007  . DIVERTICULOSIS OF COLON 04/13/2007  . Anxiety state 01/18/2007  . Essential hypertension 01/18/2007    Current Outpatient Prescriptions on File Prior to Visit  Medication Sig Dispense Refill  . acetaminophen (TYLENOL) 325 MG tablet Take 650 mg by mouth  every 6 (six) hours as needed (pain).     Marland Kitchen amLODipine (NORVASC) 5 MG tablet Take 1 tablet (5 mg total) by mouth daily. 90 tablet 3  . aspirin 325 MG EC tablet Take 325 mg by mouth daily with breakfast.    . betamethasone dipropionate 0.05 % lotion Apply 1 application topically 2 (two) times daily as needed (psoriasis).  60 mL 0  . calcium carbonate (TUMS - DOSED IN MG ELEMENTAL CALCIUM) 500 MG chewable tablet Chew 2 tablets by mouth daily as needed for heartburn.     . cholecalciferol (VITAMIN D) 1000 UNITS tablet Take 1,000 Units by mouth daily with breakfast.     . citalopram (CELEXA) 20 MG tablet Take 1 tablet (20 mg total) by mouth daily at 6 PM. Per dr Casimiro Needle    . folic acid (FOLVITE) 1 MG tablet TAKE ONE TABLET BY MOUTH ONCE DAILY 90 tablet 1  . furosemide (LASIX) 40 MG tablet TAKE TWO TABLETS BY MOUTH  DAILY 180 tablet 0  . LORazepam (ATIVAN) 0.5 MG tablet Take 0.5 tablets (0.25 mg total) by mouth 2 (two) times daily as needed for anxiety. 20 tablet 0  . mineral oil enema Place 1 enema rectally daily as needed (Bowel health).     . polyethylene glycol powder (MIRALAX) powder Take 17 g by mouth daily as needed (constipation).     . pravastatin (PRAVACHOL) 40 MG tablet Take 1 tablet (40 mg total) by mouth at bedtime. 90 tablet 3  . risperiDONE (RISPERDAL) 0.25 MG tablet  Take 1 tablet by mouth at bedtime.    . senna-docusate (SENOKOT-S) 8.6-50 MG per tablet Take 2 tablets by mouth daily as needed for mild constipation.      No current facility-administered medications on file prior to visit.     Past Medical History:  Diagnosis Date  . Anemia, unspecified   . Anxiety state, unspecified   . Atrial fibrillation (HCC)    not anticoagulation candidate due to falls  . CAD (coronary artery disease)    s/p CABG  . Carotid artery stenosis   . Complete heart block ([place]) 12/2011   s/p St.Jude dual chamber PPM 01/12/12  . Dementia   . Diverticulosis of colon (without mention of hemorrhage)    . Esophageal reflux   . HTN (hypertension)   . Hyperlipidemia   . Osteoarthrosis, unspecified whether generalized or localized, unspecified site   . Osteoporosis, unspecified   . Pacemaker-St.Jude 01/13/2012  . S/P AVR (aortic valve replacement) 2003   bioprosthetic   . Thoracic aortic aneurysm ([place]) 2003   s/p repair  . Unspecified chronic bronchitis ([place])     Past Surgical History:  Procedure Laterality Date  . AORTIC VALVE REPLACEMENT  2003  . CATARACT EXTRACTION     bilateral  . CORONARY ARTERY BYPASS GRAFT    . INTRAOCULAR LENS INSERTION     bilateral  . PACEMAKER INSERTION  01/12/12   SJM Accent DR RF implanted by Dr Rayann Heman for CHB  . PERMANENT PACEMAKER INSERTION N/A 01/12/2012   Procedure: PERMANENT PACEMAKER INSERTION;  Surgeon: Thompson Grayer, MD;  Location: [facility] CATH LAB;  Service: Cardiovascular;  Laterality: N/A;  . [place]   left  . VESICOVAGINAL FISTULA CLOSURE W/ TAH      Social History   Social History  . Marital status: Married    Spouse name: [name]  . Number of children: N/A  . Years of education: N/A   Occupational History  . retired    Social History Main Topics  . Smoking status: Never Smoker  . Smokeless tobacco: Never Used  . Alcohol use No  . Drug use: No  . Sexual activity: Not on file   Other Topics Concern  . Not on file   Social History Narrative   Lives with husband. Mother had kidney problems.  Father had coronary artery  disease.  Both died when the patient was young.  She does not know how old  they were.  She has one brother who died at age 52 of a CVA, although, he  had coronary artery disease and diabetes.  Another brother died at age 4 of  a stroke.  He also had coronary artery disease.  One sister is living, she  has coronary artery disease and diabetes and one sister has anginal symptoms. Her sister has a pacemaker (history palpitations).           Family History  Problem Relation Age of Onset  .  Diabetes Brother   . Diabetes Sister     Review of Systems  Constitutional: Negative for appetite change, fatigue and fever.  Respiratory: Negative for cough, shortness of breath and wheezing.   Cardiovascular: Positive for leg swelling (mild, right leg). Negative for chest pain and palpitations.  Gastrointestinal: Negative for abdominal pain, constipation and diarrhea.  Neurological: Negative for dizziness and headaches.  Psychiatric/Behavioral: Negative for sleep disturbance.       Objective:   Vitals:   03/11/16 1355  BP: (!) 142/84  Pulse:  83  Resp: 16  Temp: 98.1 F (36.7 C)   Filed Weights   03/11/16 1355  Weight: 179 lb (81.2 kg)   Body mass index is 33.82 kg/m.   Physical Exam Constitutional: Appears well-developed and well-nourished. No distress.  HENT:  Head: Normocephalic and atraumatic.  Neck: Neck supple. No tracheal deviation present. No thyromegaly present.  No cervical lymphadenopathy Cardiovascular: Normal rate, regular rhythm and normal heart sounds.   2/6 systolic murmur heard on RSB. No carotid bruit .  1+ b/l LE pitting edema Pulmonary/Chest: Effort normal and breath sounds normal. No respiratory distress. No has no wheezes. No rales.  Skin: Skin is warm and dry. Not diaphoretic.  Psychiatric: Normal mood and affect. Behavior is normal.       Assessment & Plan:   See Problem List for Assessment and Plan of chronic medical problems.  F/u in 6 months

## 2016-03-11 NOTE — Assessment & Plan Note (Signed)
.   BP Readings from Last 3 Encounters:  03/11/16 (!) 142/84  12/19/15 110/68  12/11/15 (!) 146/72   Variable - difficult to know true control do not want her BP to be too low - will continue current meds at current doses

## 2016-03-11 NOTE — Assessment & Plan Note (Signed)
GFR has remained stable Check cmp today

## 2016-03-11 NOTE — Assessment & Plan Note (Signed)
Sees Dr Stanford Breed annually No active symptoms of cardiac ischemia Continue current meds

## 2016-03-19 ENCOUNTER — Ambulatory Visit (INDEPENDENT_AMBULATORY_CARE_PROVIDER_SITE_OTHER): Payer: Medicare Other | Admitting: *Deleted

## 2016-03-19 DIAGNOSIS — I441 Atrioventricular block, second degree: Secondary | ICD-10-CM | POA: Diagnosis not present

## 2016-03-20 ENCOUNTER — Encounter: Payer: Self-pay | Admitting: Cardiology

## 2016-03-20 NOTE — Progress Notes (Signed)
Remote pacemaker transmission.   

## 2016-03-27 LAB — CUP PACEART REMOTE DEVICE CHECK
Battery Remaining Longevity: 100 mo
Battery Remaining Percentage: 73 %
Battery Voltage: 2.92 V
Brady Statistic AP VP Percent: 12 %
Brady Statistic AP VS Percent: 1 %
Brady Statistic RA Percent Paced: 12 %
Brady Statistic RV Percent Paced: 99 %
Implantable Lead Implant Date: 20131022
Implantable Lead Location: 753860
Implantable Lead Model: 1944
Implantable Lead Model: 1948
Implantable Pulse Generator Implant Date: 20131022
Lead Channel Impedance Value: 450 Ohm
Lead Channel Impedance Value: 660 Ohm
Lead Channel Pacing Threshold Amplitude: 0.375 V
Lead Channel Pacing Threshold Pulse Width: 0.4 ms
Lead Channel Sensing Intrinsic Amplitude: 5 mV
Lead Channel Setting Pacing Pulse Width: 0.4 ms
Lead Channel Setting Sensing Sensitivity: 2 mV
MDC IDC LEAD IMPLANT DT: 20131022
MDC IDC LEAD LOCATION: 753859
MDC IDC MSMT LEADCHNL RV PACING THRESHOLD AMPLITUDE: 0.5 V
MDC IDC MSMT LEADCHNL RV PACING THRESHOLD PULSEWIDTH: 0.4 ms
MDC IDC MSMT LEADCHNL RV SENSING INTR AMPL: 12 mV
MDC IDC PG SERIAL: 7393244
MDC IDC SESS DTM: 20171228070548
MDC IDC SET LEADCHNL RA PACING AMPLITUDE: 1.375
MDC IDC SET LEADCHNL RV PACING AMPLITUDE: 0.75 V
MDC IDC STAT BRADY AS VP PERCENT: 87 %
MDC IDC STAT BRADY AS VS PERCENT: 1 %

## 2016-03-31 DIAGNOSIS — H903 Sensorineural hearing loss, bilateral: Secondary | ICD-10-CM | POA: Diagnosis not present

## 2016-03-31 DIAGNOSIS — H61303 Acquired stenosis of external ear canal, unspecified, bilateral: Secondary | ICD-10-CM | POA: Diagnosis not present

## 2016-03-31 DIAGNOSIS — H6123 Impacted cerumen, bilateral: Secondary | ICD-10-CM | POA: Diagnosis not present

## 2016-04-01 ENCOUNTER — Encounter: Payer: Self-pay | Admitting: Internal Medicine

## 2016-04-01 ENCOUNTER — Ambulatory Visit (INDEPENDENT_AMBULATORY_CARE_PROVIDER_SITE_OTHER): Payer: Medicare Other | Admitting: Internal Medicine

## 2016-04-01 VITALS — BP 144/76 | HR 93 | Temp 98.1°F | Resp 18 | Wt 178.0 lb

## 2016-04-01 DIAGNOSIS — J069 Acute upper respiratory infection, unspecified: Secondary | ICD-10-CM | POA: Diagnosis not present

## 2016-04-01 MED ORDER — DOXYCYCLINE HYCLATE 100 MG PO TABS: 100.0000 mg | ORAL_TABLET | Freq: Two times a day (BID) | ORAL | 0 refills | Status: DC

## 2016-04-01 MED ORDER — BENZONATATE 200 MG PO CAPS: 200.0000 mg | ORAL_CAPSULE | Freq: Three times a day (TID) | ORAL | 0 refills | Status: DC | PRN

## 2016-04-01 NOTE — Progress Notes (Signed)
Pre visit review using our clinic review tool, if applicable. No additional management support is needed unless otherwise documented below in the visit note. 

## 2016-04-01 NOTE — Progress Notes (Signed)
Subjective:    Patient ID: [name], female    DOB: 1923/04/15, 83 y.o.   MRN: [id number]  HPI She is here for an acute visit.  He husband provides most of the history.   Her symtpoms started about one week and a half ago.  She is coughing a lot.   The cogch is deep and she can not get the phlegm up.  She has mild SOB with exertion, which is new, nausea, and mild headaches.  She has not had a fever.  She denies change in appetite, congestion, ear pain, sore throat and sinus pain.  She has had no GI symptoms.   She has been taking halls and mucinex.    Medications and allergies reviewed with patient and updated if appropriate.  Patient Active Problem List   Diagnosis Date Noted  . CKD (chronic kidney disease) stage 3, GFR 30-59 ml/min 09/10/2015  . Arthritis of right hip, severe 09/10/2015  . Pacemaker-St.Jude 01/13/2012  . Second degree Mobitz II AV block 01/11/2012  . CAROTID STENOSIS 11/29/2008  . Hearing loss 10/12/2008  . Osteoporosis 05/08/2008  . AORTIC VALVE REPLACEMENT, HX OF 05/08/2008  . Hyperlipidemia 04/13/2007  . Mild dementia 04/13/2007  . Coronary atherosclerosis 04/13/2007  . Paroxysmal atrial fibrillation ([place]) 04/13/2007  . DIVERTICULOSIS OF COLON 04/13/2007  . Anxiety state 01/18/2007  . Essential hypertension 01/18/2007    Current Outpatient Prescriptions on File Prior to Visit  Medication Sig Dispense Refill  . acetaminophen (TYLENOL) 325 MG tablet Take 650 mg by mouth every 6 (six) hours as needed (pain).     Marland Kitchen amLODipine (NORVASC) 5 MG tablet Take 1 tablet (5 mg total) by mouth daily. 90 tablet 3  . aspirin 325 MG EC tablet Take 325 mg by mouth daily with breakfast.    . betamethasone dipropionate 0.05 % lotion Apply 1 application topically 2 (two) times daily as needed (psoriasis).  60 mL 0  . calcium carbonate (TUMS - DOSED IN MG ELEMENTAL CALCIUM) 500 MG chewable tablet Chew 2 tablets by mouth daily as needed for heartburn.     . cholecalciferol  (VITAMIN D) 1000 UNITS tablet Take 1,000 Units by mouth daily with breakfast.     . citalopram (CELEXA) 20 MG tablet Take 1 tablet (20 mg total) by mouth daily at 6 PM. Per dr Casimiro Needle    . folic acid (FOLVITE) 1 MG tablet TAKE ONE TABLET BY MOUTH ONCE DAILY 90 tablet 1  . furosemide (LASIX) 40 MG tablet TAKE TWO TABLETS BY MOUTH  DAILY 180 tablet 0  . LORazepam (ATIVAN) 0.5 MG tablet Take 0.5 tablets (0.25 mg total) by mouth 2 (two) times daily as needed for anxiety. 20 tablet 0  . mineral oil enema Place 1 enema rectally daily as needed (Bowel health).     . polyethylene glycol powder (MIRALAX) powder Take 17 g by mouth daily as needed (constipation).     . pravastatin (PRAVACHOL) 40 MG tablet Take 1 tablet (40 mg total) by mouth at bedtime. 90 tablet 3  . risperiDONE (RISPERDAL) 0.25 MG tablet Take 1 tablet by mouth at bedtime.    . senna-docusate (SENOKOT-S) 8.6-50 MG per tablet Take 2 tablets by mouth daily as needed for mild constipation.      No current facility-administered medications on file prior to visit.     Past Medical History:  Diagnosis Date  . Anemia, unspecified   . Anxiety state, unspecified   . Atrial fibrillation ([place])  not anticoagulation candidate due to falls  . CAD (coronary artery disease)    s/p CABG  . Carotid artery stenosis   . Complete heart block ([place]) 12/2011   s/p St.Jude dual chamber PPM 01/12/12  . Dementia   . Diverticulosis of colon (without mention of hemorrhage)   . Esophageal reflux   . HTN (hypertension)   . Hyperlipidemia   . Osteoarthrosis, unspecified whether generalized or localized, unspecified site   . Osteoporosis, unspecified   . Pacemaker-St.Jude 01/13/2012  . S/P AVR (aortic valve replacement) 2003   bioprosthetic   . Thoracic aortic aneurysm ([place]) 2003   s/p repair  . Unspecified chronic bronchitis     Past Surgical History:  Procedure Laterality Date  . AORTIC VALVE REPLACEMENT  2003  . CATARACT EXTRACTION      bilateral  . CORONARY ARTERY BYPASS GRAFT    . INTRAOCULAR LENS INSERTION     bilateral  . PACEMAKER INSERTION  01/12/12   SJM Accent DR RF implanted by Dr Rayann Heman for CHB  . PERMANENT PACEMAKER INSERTION N/A 01/12/2012   Procedure: PERMANENT PACEMAKER INSERTION;  Surgeon: Thompson Grayer, MD;  Location: [facility] CATH LAB;  Service: Cardiovascular;  Laterality: N/A;  . [place]   left  . VESICOVAGINAL FISTULA CLOSURE W/ TAH      Social History   Social History  . Marital status: Married    Spouse name: [name]  . Number of children: N/A  . Years of education: N/A   Occupational History  . retired    Social History Main Topics  . Smoking status: Never Smoker  . Smokeless tobacco: Never Used  . Alcohol use No  . Drug use: No  . Sexual activity: Not on file   Other Topics Concern  . Not on file   Social History Narrative   Lives with husband. Mother had kidney problems.  Father had coronary artery  disease.  Both died when the patient was young.  She does not know how old  they were.  She has one brother who died at age 77 of a CVA, although, he  had coronary artery disease and diabetes.  Another brother died at age 17 of  a stroke.  He also had coronary artery disease.  One sister is living, she  has coronary artery disease and diabetes and one sister has anginal symptoms. Her sister has a pacemaker (history palpitations).           Family History  Problem Relation Age of Onset  . Diabetes Brother   . Diabetes Sister     Review of Systems  Constitutional: Negative for appetite change and fever.  HENT: Negative for congestion, ear pain, sinus pain and sore throat.   Respiratory: Positive for cough and shortness of breath (mild with exertion). Negative for wheezing.   Cardiovascular: Negative for chest pain.  Gastrointestinal: Positive for nausea. Negative for abdominal pain and diarrhea.  Neurological: Positive for headaches (mild). Negative for dizziness and  light-headedness.       Objective:   Vitals:   04/01/16 1611  BP: (!) 144/76  Pulse: 93  Resp: 18  Temp: 98.1 F (36.7 C)   Filed Weights   04/01/16 1611  Weight: 178 lb (80.7 kg)   Body mass index is 33.63 kg/m.  Wt Readings from Last 3 Encounters:  04/01/16 178 lb (80.7 kg)  03/11/16 179 lb (81.2 kg)  12/19/15 176 lb 3.2 oz (79.9 kg)     Physical  Exam GENERAL APPEARANCE: Appears stated age, well appearing, NAD EYES: conjunctiva clear, no icterus HEENT: bilateral tympanic membranes and ear canals normal, oropharynx with mild erythema, no thyromegaly, trachea midline, no cervical or supraclavicular lymphadenopathy LUNGS: Clear to auscultation without wheeze or crackles, unlabored breathing, good air entry bilaterally HEART: Normal S1,S2 without murmurs EXTREMITIES: Without clubbing, cyanosis, or edema        Assessment & Plan:   See Problem List for Assessment and Plan of chronic medical problems.

## 2016-04-01 NOTE — Patient Instructions (Addendum)
  An antibiotic and cough medication was sent to your pharmacy.  Continue the halls and mucinex.   Your prescription(s) have been submitted to your pharmacy. Please take as directed and contact our office if you believe you are having problem(s) with the medication(s).   Please followup in June at your already scheduled appointment, sooner if needed  Please call if you do not feel better.

## 2016-04-01 NOTE — Assessment & Plan Note (Signed)
Given duration and her age and possibility of a bacterial infection we will start an antibiotic Doxycyline 100 mg BID x 10 days Tessalon perles  Continue mucinex, tylenol as needed, halls prn  Call or return if no improvement

## 2016-04-20 ENCOUNTER — Other Ambulatory Visit: Payer: Self-pay | Admitting: Internal Medicine

## 2016-05-15 ENCOUNTER — Other Ambulatory Visit: Payer: Self-pay | Admitting: Internal Medicine

## 2016-06-18 ENCOUNTER — Encounter: Payer: Medicare Other | Admitting: *Deleted

## 2016-06-18 ENCOUNTER — Telehealth: Payer: Self-pay | Admitting: Cardiology

## 2016-06-18 NOTE — Telephone Encounter (Signed)
LMOVM reminding pt to send remote transmission.   

## 2016-06-19 ENCOUNTER — Encounter: Payer: Self-pay | Admitting: Cardiology

## 2016-06-30 ENCOUNTER — Ambulatory Visit (INDEPENDENT_AMBULATORY_CARE_PROVIDER_SITE_OTHER): Payer: Medicare Other | Admitting: *Deleted

## 2016-06-30 DIAGNOSIS — I441 Atrioventricular block, second degree: Secondary | ICD-10-CM

## 2016-06-30 LAB — CUP PACEART INCLINIC DEVICE CHECK
Brady Statistic RA Percent Paced: 10 %
Brady Statistic RV Percent Paced: 99 %
Date Time Interrogation Session: 20180410140221
Implantable Lead Implant Date: 20131022
Implantable Lead Implant Date: 20131022
Implantable Lead Location: 753859
Implantable Lead Model: 1944
Implantable Lead Model: 1948
Lead Channel Impedance Value: 450 Ohm
Lead Channel Impedance Value: 675 Ohm
Lead Channel Pacing Threshold Pulse Width: 0.4 ms
Lead Channel Pacing Threshold Pulse Width: 0.4 ms
Lead Channel Sensing Intrinsic Amplitude: 12 mV
Lead Channel Sensing Intrinsic Amplitude: 5 mV
Lead Channel Setting Pacing Amplitude: 1.375
MDC IDC LEAD LOCATION: 753860
MDC IDC MSMT BATTERY VOLTAGE: 2.92 V
MDC IDC MSMT LEADCHNL RA PACING THRESHOLD AMPLITUDE: 0.5 V
MDC IDC MSMT LEADCHNL RV PACING THRESHOLD AMPLITUDE: 0.5 V
MDC IDC PG IMPLANT DT: 20131022
MDC IDC PG SERIAL: 7393244
MDC IDC SET LEADCHNL RV PACING AMPLITUDE: 0.875
MDC IDC SET LEADCHNL RV PACING PULSEWIDTH: 0.4 ms
MDC IDC SET LEADCHNL RV SENSING SENSITIVITY: 2 mV
Pulse Gen Model: 2210

## 2016-06-30 NOTE — Progress Notes (Signed)
Pacemaker check in clinic. Normal device function. Thresholds, sensing, impedances consistent with previous measurements. Device programmed to maximize longevity. 258 mode switches (<1%), No OAC due to high fall risk, EGMs suggest AT and AF, longest 7 hours 40 minutes, Peak A 640 bpm, Peak V 83 bpm. No high ventricular rates noted. 3 PMT noted by device, EGM appears PMT-- VA conduction 200 ms, patient asymptomatic, No V-A conduction noted during testing today, PVARP 275 ms. Device programmed at appropriate safety margins. Histogram distribution appropriate for patient activity level. Device programmed to optimize intrinsic conduction. Estimated longevity 8.3 years. Patient education completed. Merlin 09/29/16, ROV with JA 11/2016.

## 2016-08-18 ENCOUNTER — Other Ambulatory Visit: Payer: Self-pay | Admitting: Internal Medicine

## 2016-09-09 ENCOUNTER — Other Ambulatory Visit (INDEPENDENT_AMBULATORY_CARE_PROVIDER_SITE_OTHER): Payer: Medicare Other

## 2016-09-09 ENCOUNTER — Encounter: Payer: Self-pay | Admitting: Internal Medicine

## 2016-09-09 ENCOUNTER — Ambulatory Visit (INDEPENDENT_AMBULATORY_CARE_PROVIDER_SITE_OTHER): Payer: Medicare Other | Admitting: Internal Medicine

## 2016-09-09 VITALS — BP 140/84 | HR 106 | Temp 98.1°F | Resp 16 | Wt 178.0 lb

## 2016-09-09 DIAGNOSIS — R739 Hyperglycemia, unspecified: Secondary | ICD-10-CM

## 2016-09-09 DIAGNOSIS — I2510 Atherosclerotic heart disease of native coronary artery without angina pectoris: Secondary | ICD-10-CM | POA: Diagnosis not present

## 2016-09-09 DIAGNOSIS — N183 Chronic kidney disease, stage 3 unspecified: Secondary | ICD-10-CM

## 2016-09-09 DIAGNOSIS — F0390 Unspecified dementia without behavioral disturbance: Secondary | ICD-10-CM | POA: Diagnosis not present

## 2016-09-09 DIAGNOSIS — F411 Generalized anxiety disorder: Secondary | ICD-10-CM

## 2016-09-09 DIAGNOSIS — R7303 Prediabetes: Secondary | ICD-10-CM | POA: Insufficient documentation

## 2016-09-09 DIAGNOSIS — I10 Essential (primary) hypertension: Secondary | ICD-10-CM | POA: Diagnosis not present

## 2016-09-09 DIAGNOSIS — F03A0 Unspecified dementia, mild, without behavioral disturbance, psychotic disturbance, mood disturbance, and anxiety: Secondary | ICD-10-CM

## 2016-09-09 DIAGNOSIS — E7800 Pure hypercholesterolemia, unspecified: Secondary | ICD-10-CM

## 2016-09-09 LAB — CBC WITH DIFFERENTIAL/PLATELET
Basophils Absolute: 0.1 10*3/uL (ref 0.0–0.1)
Basophils Relative: 0.9 % (ref 0.0–3.0)
EOS ABS: 0.1 10*3/uL (ref 0.0–0.7)
EOS PCT: 2.2 % (ref 0.0–5.0)
HEMATOCRIT: 40 % (ref 36.0–46.0)
Hemoglobin: 13.3 g/dL (ref 12.0–15.0)
LYMPHS PCT: 22.6 % (ref 12.0–46.0)
Lymphs Abs: 1.5 10*3/uL (ref 0.7–4.0)
MCHC: 33.1 g/dL (ref 30.0–36.0)
MCV: 91.5 fl (ref 78.0–100.0)
Monocytes Absolute: 0.8 10*3/uL (ref 0.1–1.0)
Monocytes Relative: 11.9 % (ref 3.0–12.0)
NEUTROS ABS: 4.2 10*3/uL (ref 1.4–7.7)
Neutrophils Relative %: 62.4 % (ref 43.0–77.0)
PLATELETS: 220 10*3/uL (ref 150.0–400.0)
RBC: 4.38 Mil/uL (ref 3.87–5.11)
RDW: 15.4 % (ref 11.5–15.5)
WBC: 6.7 10*3/uL (ref 4.0–10.5)

## 2016-09-09 LAB — LIPID PANEL
CHOL/HDL RATIO: 4
CHOLESTEROL: 177 mg/dL (ref 0–200)
HDL: 39.7 mg/dL (ref >39.00)
NonHDL: 137.6
Triglycerides: 244 mg/dL — ABNORMAL HIGH (ref 0.0–149.0)
VLDL: 48.8 mg/dL — AB (ref 0.0–40.0)

## 2016-09-09 LAB — COMPREHENSIVE METABOLIC PANEL
ALT: 6 U/L (ref 0–35)
AST: 12 U/L (ref 0–37)
Albumin: 4.2 g/dL (ref 3.5–5.2)
Alkaline Phosphatase: 85 U/L (ref 39–117)
BUN: 22 mg/dL (ref 6–23)
CO2: 27 meq/L (ref 19–32)
Calcium: 9.3 mg/dL (ref 8.4–10.5)
Chloride: 101 mEq/L (ref 96–112)
Creatinine, Ser: 1.23 mg/dL — ABNORMAL HIGH (ref 0.40–1.20)
GFR: 43.52 mL/min — ABNORMAL LOW (ref >60.00)
Glucose, Bld: 77 mg/dL (ref 70–99)
POTASSIUM: 4.2 meq/L (ref 3.5–5.1)
SODIUM: 138 meq/L (ref 135–145)
Total Bilirubin: 0.5 mg/dL (ref 0.2–1.2)
Total Protein: 6.9 g/dL (ref 6.0–8.3)

## 2016-09-09 LAB — LDL CHOLESTEROL, DIRECT: Direct LDL: 96 mg/dL

## 2016-09-09 LAB — TSH: TSH: 1.83 u[IU]/mL (ref 0.35–4.50)

## 2016-09-09 LAB — HEMOGLOBIN A1C: HEMOGLOBIN A1C: 5.7 % (ref 4.6–6.5)

## 2016-09-09 NOTE — Assessment & Plan Note (Signed)
Reasonably controlled Continue current medication at current doses cmp

## 2016-09-09 NOTE — Assessment & Plan Note (Signed)
No chest pain or SOB Seeing cardiology annually Continue ASA, statin and current lasix dose

## 2016-09-09 NOTE — Assessment & Plan Note (Signed)
Controlled, stable Continue current dose of medication  

## 2016-09-09 NOTE — Progress Notes (Signed)
Subjective:    Patient ID: [name], female    DOB: 06-Feb-1924, 83 y.o.   MRN: [id number]  HPI The patient is here for follow up.  CAD, Hyperlipidemia: She is taking her medication daily. She is compliant with a low fat/cholesterol diet. She is not exercising regularly. She denies myalgias.   Hypertension: She is taking her medication daily. She is compliant with a low sodium diet.  She denies chest pain, palpitations, edema, shortness of breath and regular headaches. She is not exercising regularly.  She does not monitor her blood pressure at home.    Anxiety: She is taking her medication daily as prescribed. She denies any side effects from the medication. She feels her anxiety is well controlled and she is happy with her current dose of medication.   Hyperglycemia:  She eats fairly healthy, but is not compliant with a low sugar/carb diet.  She is not exercising regularly.    CKD:  She is taking all her medications daily.  She does drink water during the day, but probably not enough.    Medications and allergies reviewed with patient and updated if appropriate.  Patient Active Problem List   Diagnosis Date Noted  . Upper respiratory tract infection 04/01/2016  . CKD (chronic kidney disease) stage 3, GFR 30-59 ml/min 09/10/2015  . Arthritis of right hip, severe 09/10/2015  . Pacemaker-St.Jude 01/13/2012  . Second degree Mobitz II AV block 01/11/2012  . CAROTID STENOSIS 11/29/2008  . Hearing loss 10/12/2008  . Osteoporosis 05/08/2008  . AORTIC VALVE REPLACEMENT, HX OF 05/08/2008  . Hyperlipidemia 04/13/2007  . Mild dementia 04/13/2007  . Coronary atherosclerosis 04/13/2007  . Paroxysmal atrial fibrillation ([place]) 04/13/2007  . DIVERTICULOSIS OF COLON 04/13/2007  . Anxiety state 01/18/2007  . Essential hypertension 01/18/2007    Current Outpatient Prescriptions on File Prior to Visit  Medication Sig Dispense Refill  . acetaminophen (TYLENOL) 325 MG tablet Take 650 mg by  mouth every 6 (six) hours as needed (pain).     Marland Kitchen amLODipine (NORVASC) 5 MG tablet TAKE ONE TABLET BY MOUTH DAILY 90 tablet 1  . aspirin 325 MG EC tablet Take 325 mg by mouth daily with breakfast.    . betamethasone dipropionate 0.05 % lotion Apply 1 application topically 2 (two) times daily as needed (psoriasis).  60 mL 0  . calcium carbonate (TUMS - DOSED IN MG ELEMENTAL CALCIUM) 500 MG chewable tablet Chew 2 tablets by mouth daily as needed for heartburn.     . cholecalciferol (VITAMIN D) 1000 UNITS tablet Take 1,000 Units by mouth daily with breakfast.     . citalopram (CELEXA) 20 MG tablet Take 1 tablet (20 mg total) by mouth daily at 6 PM. Per dr Casimiro Needle    . folic acid (FOLVITE) 1 MG tablet TAKE ONE TABLET BY MOUTH ONCE DAILY 90 tablet 1  . furosemide (LASIX) 40 MG tablet TAKE TWO TABLETS BY MOUTH ONCE DAILY 180 tablet 1  . LORazepam (ATIVAN) 0.5 MG tablet Take 0.5 tablets (0.25 mg total) by mouth 2 (two) times daily as needed for anxiety. 20 tablet 0  . mineral oil enema Place 1 enema rectally daily as needed (Bowel health).     . polyethylene glycol powder (MIRALAX) powder Take 17 g by mouth daily as needed (constipation).     . pravastatin (PRAVACHOL) 40 MG tablet TAKE ONE TABLET BY MOUTH AT BEDTIME 90 tablet 1  . risperiDONE (RISPERDAL) 0.25 MG tablet Take 1 tablet by mouth at  bedtime.    . senna-docusate (SENOKOT-S) 8.6-50 MG per tablet Take 2 tablets by mouth daily as needed for mild constipation.      No current facility-administered medications on file prior to visit.     Past Medical History:  Diagnosis Date  . Anemia, unspecified   . Anxiety state, unspecified   . Atrial fibrillation ([facility])    not anticoagulation candidate due to falls  . CAD (coronary artery disease)    s/p CABG  . Carotid artery stenosis   . Complete heart block ([place]) 12/2011   s/p St.Jude dual chamber PPM 01/12/12  . Dementia   . Diverticulosis of colon (without mention of hemorrhage)   . Esophageal  reflux   . HTN (hypertension)   . Hyperlipidemia   . Osteoarthrosis, unspecified whether generalized or localized, unspecified site   . Osteoporosis, unspecified   . Pacemaker-St.Jude 01/13/2012  . S/P AVR (aortic valve replacement) 2003   bioprosthetic   . Thoracic aortic aneurysm ([place]) 2003   s/p repair  . Unspecified chronic bronchitis     Past Surgical History:  Procedure Laterality Date  . AORTIC VALVE REPLACEMENT  2003  . CATARACT EXTRACTION     bilateral  . CORONARY ARTERY BYPASS GRAFT    . INTRAOCULAR LENS INSERTION     bilateral  . PACEMAKER INSERTION  01/12/12   SJM Accent DR RF implanted by Dr Rayann Heman for CHB  . PERMANENT PACEMAKER INSERTION N/A 01/12/2012   Procedure: PERMANENT PACEMAKER INSERTION;  Surgeon: Thompson Grayer, MD;  Location[facility] CATH LAB;  Service: Cardiovascular;  Laterality: N/A;  . [place]   left  . VESICOVAGINAL FISTULA CLOSURE W/ TAH      Social History   Social History  . Marital status: Married    Spouse name: [name]  . Number of children: N/A  . Years of education: N/A   Occupational History  . retired    Social History Main Topics  . Smoking status: Never Smoker  . Smokeless tobacco: Never Used  . Alcohol use No  . Drug use: No  . Sexual activity: Not on file   Other Topics Concern  . Not on file   Social History Narrative   Lives with husband. Mother had kidney problems.  Father had coronary artery  disease.  Both died when the patient was young.  She does not know how old  they were.  She has one brother who died at age 39 of a CVA, although, he  had coronary artery disease and diabetes.  Another brother died at age 62 of  a stroke.  He also had coronary artery disease.  One sister is living, she  has coronary artery disease and diabetes and one sister has anginal symptoms. Her sister has a pacemaker (history palpitations).           Family History  Problem Relation Age of Onset  . Diabetes Brother   .  Diabetes Sister     Review of Systems  Constitutional: Negative for appetite change and fever.  Respiratory: Negative for cough, shortness of breath and wheezing.   Cardiovascular: Positive for leg swelling. Negative for chest pain and palpitations.  Gastrointestinal: Negative for abdominal pain.  Neurological: Negative for dizziness, light-headedness and headaches.  Psychiatric/Behavioral: Negative for sleep disturbance. The patient is nervous/anxious (at times, transient).        Objective:   Vitals:   09/09/16 1451  BP: 140/84  Pulse: (!) 106  Resp: 16  Temp: 98.1 F (36.7 C)   Wt Readings from Last 3 Encounters:  09/09/16 178 lb (80.7 kg)  04/01/16 178 lb (80.7 kg)  03/11/16 179 lb (81.2 kg)   Body mass index is 33.63 kg/m.   Physical Exam    Constitutional: Appears well-developed and well-nourished. No distress.  HENT:  Head: Normocephalic and atraumatic.  Neck: Neck supple. No tracheal deviation present. No thyromegaly present.  No cervical lymphadenopathy Cardiovascular: Normal rate, regular rhythm, pronounced S2.   2/6 systolic murmur heard. No carotid bruit .  1+ b/l pitting edema  R> L Pulmonary/Chest: Effort normal and breath sounds normal. No respiratory distress. No has no wheezes. No rales.  Skin: Skin is warm and dry. Not diaphoretic.  Psychiatric: Normal mood and affect. Behavior is normal.      Assessment & Plan:    See Problem List for Assessment and Plan of chronic medical problems.

## 2016-09-09 NOTE — Assessment & Plan Note (Signed)
Check lipid panel  Continue daily statin Regular exercise and healthy diet encouraged  

## 2016-09-09 NOTE — Assessment & Plan Note (Signed)
Check a1c 

## 2016-09-09 NOTE — Patient Instructions (Signed)
  Test(s) ordered today. Your results will be released to MyChart (or called to you) after review, usually within 72hours after test completion. If any changes need to be made, you will be notified at that same time.  Medications reviewed and updated.  No changes recommended at this time.    Please followup in 6 months   

## 2016-09-09 NOTE — Assessment & Plan Note (Signed)
cmp

## 2016-09-09 NOTE — Assessment & Plan Note (Signed)
Stable Has had agitation in past  - was seeing psychiatrist, but has been stable so no longer seeing him Continue risperdal, ativan

## 2016-09-14 ENCOUNTER — Encounter: Payer: Self-pay | Admitting: Emergency Medicine

## 2016-09-16 ENCOUNTER — Other Ambulatory Visit: Payer: Self-pay | Admitting: Internal Medicine

## 2016-09-29 ENCOUNTER — Telehealth: Payer: Self-pay | Admitting: Cardiology

## 2016-09-29 ENCOUNTER — Ambulatory Visit (INDEPENDENT_AMBULATORY_CARE_PROVIDER_SITE_OTHER): Payer: Medicare Other | Admitting: *Deleted

## 2016-09-29 DIAGNOSIS — I441 Atrioventricular block, second degree: Secondary | ICD-10-CM | POA: Diagnosis not present

## 2016-09-29 NOTE — Telephone Encounter (Signed)
Confirmed remote transmission w/ pt husband.   

## 2016-09-30 NOTE — Progress Notes (Signed)
Remote pacemaker transmission.   

## 2016-10-01 LAB — CUP PACEART REMOTE DEVICE CHECK
Battery Remaining Longevity: 89 mo
Battery Remaining Percentage: 65 %
Brady Statistic AP VS Percent: 1 %
Brady Statistic AS VP Percent: 88 %
Brady Statistic AS VS Percent: 1 %
Brady Statistic RV Percent Paced: 99 %
Implantable Lead Implant Date: 20131022
Implantable Lead Model: 1944
Implantable Lead Model: 1948
Lead Channel Impedance Value: 440 Ohm
Lead Channel Pacing Threshold Amplitude: 0.5 V
Lead Channel Pacing Threshold Pulse Width: 0.4 ms
Lead Channel Sensing Intrinsic Amplitude: 12 mV
Lead Channel Sensing Intrinsic Amplitude: 5 mV
Lead Channel Setting Pacing Amplitude: 0.75 V
Lead Channel Setting Pacing Amplitude: 1.375
Lead Channel Setting Pacing Pulse Width: 0.4 ms
MDC IDC LEAD IMPLANT DT: 20131022
MDC IDC LEAD LOCATION: 753859
MDC IDC LEAD LOCATION: 753860
MDC IDC MSMT BATTERY VOLTAGE: 2.9 V
MDC IDC MSMT LEADCHNL RA PACING THRESHOLD AMPLITUDE: 0.375 V
MDC IDC MSMT LEADCHNL RA PACING THRESHOLD PULSEWIDTH: 0.4 ms
MDC IDC MSMT LEADCHNL RV IMPEDANCE VALUE: 660 Ohm
MDC IDC PG IMPLANT DT: 20131022
MDC IDC PG SERIAL: 7393244
MDC IDC SESS DTM: 20180710203145
MDC IDC SET LEADCHNL RV SENSING SENSITIVITY: 2 mV
MDC IDC STAT BRADY AP VP PERCENT: 11 %
MDC IDC STAT BRADY RA PERCENT PACED: 11 %

## 2016-10-05 ENCOUNTER — Encounter: Payer: Self-pay | Admitting: Cardiology

## 2016-10-18 ENCOUNTER — Other Ambulatory Visit: Payer: Self-pay | Admitting: Internal Medicine

## 2016-12-14 ENCOUNTER — Ambulatory Visit (INDEPENDENT_AMBULATORY_CARE_PROVIDER_SITE_OTHER): Payer: Medicare Other | Admitting: Internal Medicine

## 2016-12-14 ENCOUNTER — Encounter: Payer: Self-pay | Admitting: Internal Medicine

## 2016-12-14 VITALS — BP 142/68 | HR 94 | Ht 61.0 in | Wt 177.0 lb

## 2016-12-14 DIAGNOSIS — I10 Essential (primary) hypertension: Secondary | ICD-10-CM | POA: Diagnosis not present

## 2016-12-14 DIAGNOSIS — I2510 Atherosclerotic heart disease of native coronary artery without angina pectoris: Secondary | ICD-10-CM | POA: Diagnosis not present

## 2016-12-14 DIAGNOSIS — I480 Paroxysmal atrial fibrillation: Secondary | ICD-10-CM

## 2016-12-14 DIAGNOSIS — I441 Atrioventricular block, second degree: Secondary | ICD-10-CM

## 2016-12-14 LAB — CUP PACEART INCLINIC DEVICE CHECK
Battery Remaining Longevity: 88 mo
Brady Statistic RA Percent Paced: 13 %
Brady Statistic RV Percent Paced: 99.18 %
Date Time Interrogation Session: 20180924151452
Implantable Lead Location: 753860
Lead Channel Impedance Value: 425 Ohm
Lead Channel Impedance Value: 637.5 Ohm
Lead Channel Pacing Threshold Amplitude: 0.5 V
Lead Channel Pacing Threshold Pulse Width: 0.4 ms
Lead Channel Pacing Threshold Pulse Width: 0.4 ms
Lead Channel Sensing Intrinsic Amplitude: 6.5 mV
Lead Channel Setting Pacing Amplitude: 1.375
Lead Channel Setting Pacing Pulse Width: 0.4 ms
Lead Channel Setting Sensing Sensitivity: 2 mV
MDC IDC LEAD IMPLANT DT: 20131022
MDC IDC LEAD IMPLANT DT: 20131022
MDC IDC LEAD LOCATION: 753859
MDC IDC MSMT BATTERY VOLTAGE: 2.9 V
MDC IDC MSMT LEADCHNL RA PACING THRESHOLD AMPLITUDE: 0.5 V
MDC IDC MSMT LEADCHNL RA PACING THRESHOLD AMPLITUDE: 0.5 V
MDC IDC MSMT LEADCHNL RA PACING THRESHOLD PULSEWIDTH: 0.4 ms
MDC IDC MSMT LEADCHNL RA SENSING INTR AMPL: 5 mV
MDC IDC MSMT LEADCHNL RV PACING THRESHOLD AMPLITUDE: 0.5 V
MDC IDC MSMT LEADCHNL RV PACING THRESHOLD PULSEWIDTH: 0.4 ms
MDC IDC PG IMPLANT DT: 20131022
MDC IDC SET LEADCHNL RV PACING AMPLITUDE: 0.875
Pulse Gen Model: 2210
Pulse Gen Serial Number: 7393244

## 2016-12-14 NOTE — Progress Notes (Signed)
PCP: Binnie Rail, MD Primary Cardiologist:  Dr Stanford Breed Primary EP:  Dr Rayann Heman  [name] is a 83 y.o. female who presents today for routine electrophysiology followup.  Since last being seen in our clinic, the patient reports doing reasonably well.  + dementia. Today, she denies symptoms of palpitations, chest pain, shortness of breath,  lower extremity edema, dizziness, presyncope, or syncope.  The patient is otherwise without complaint today.   Past Medical History:  Diagnosis Date  . Anemia, unspecified   . Anxiety state, unspecified   . Atrial fibrillation (H[facility])    not anticoagulation candidate due to falls  . CAD (coronary artery disease)    s/p CABG  . Carotid artery stenosis   . Complete heart block ([place]) 12/2011   s/p St.Jude dual chamber PPM 01/12/12  . Dementia   . Diverticulosis of colon (without mention of hemorrhage)   . Esophageal reflux   . HTN (hypertension)   . Hyperlipidemia   . Osteoarthrosis, unspecified whether generalized or localized, unspecified site   . Osteoporosis, unspecified   . Pacemaker-St.Jude 01/13/2012  . S/P AVR (aortic valve replacement) 2003   bioprosthetic   . Thoracic aortic aneurysm ([place]) 2003   s/p repair  . Unspecified chronic bronchitis ([place])    Past Surgical History:  Procedure Laterality Date  . AORTIC VALVE REPLACEMENT  2003  . CATARACT EXTRACTION     bilateral  . CORONARY ARTERY BYPASS GRAFT    . INTRAOCULAR LENS INSERTION     bilateral  . PACEMAKER INSERTION  01/12/12   SJM Accent DR RF implanted by Dr Rayann Heman for CHB  . PERMANENT PACEMAKER INSERTION N/A 01/12/2012   Procedure: PERMANENT PACEMAKER INSERTION;  Surgeon: Thompson Grayer, MD;  Location: [facility] CATH LAB;  Service: Cardiovascular;  Laterality: N/A;  . [place]   left  . VESICOVAGINAL FISTULA CLOSURE W/ TAH      ROS- all systems are reviewed and negative except as per HPI above  Current Outpatient Prescriptions  Medication Sig Dispense  Refill  . acetaminophen (TYLENOL) 325 MG tablet Take 650 mg by mouth every 6 (six) hours as needed (pain).     Marland Kitchen amLODipine (NORVASC) 5 MG tablet TAKE ONE TABLET BY MOUTH ONCE DAILY 90 tablet 1  . aspirin 325 MG EC tablet Take 325 mg by mouth daily with breakfast.    . betamethasone dipropionate 0.05 % lotion Apply 1 application topically 2 (two) times daily as needed (psoriasis).  60 mL 0  . calcium carbonate (TUMS - DOSED IN MG ELEMENTAL CALCIUM) 500 MG chewable tablet Chew 2 tablets by mouth daily as needed for heartburn.     . cholecalciferol (VITAMIN D) 1000 UNITS tablet Take 1,000 Units by mouth daily with breakfast.     . citalopram (CELEXA) 20 MG tablet Take 1 tablet (20 mg total) by mouth daily at 6 PM. Per dr Casimiro Needle    . folic acid (FOLVITE) 1 MG tablet TAKE 1 TABLET BY MOUTH ONCE DAILY 90 tablet 1  . furosemide (LASIX) 40 MG tablet TAKE TWO TABLETS BY MOUTH ONCE DAILY 180 tablet 1  . LORazepam (ATIVAN) 0.5 MG tablet Take 0.5 tablets (0.25 mg total) by mouth 2 (two) times daily as needed for anxiety. 20 tablet 0  . mineral oil enema Place 1 enema rectally daily as needed (Bowel health).     . polyethylene glycol powder (MIRALAX) powder Take 17 g by mouth daily as needed (constipation).     Marland Kitchen  pravastatin (PRAVACHOL) 40 MG tablet TAKE ONE TABLET BY MOUTH AT BEDTIME 90 tablet 1  . risperiDONE (RISPERDAL) 0.25 MG tablet Take 1 tablet by mouth at bedtime.    . senna-docusate (SENOKOT-S) 8.6-50 MG per tablet Take 2 tablets by mouth daily as needed for mild constipation.      No current facility-administered medications for this visit.     Physical Exam: Vitals:   12/14/16 1425  BP: (!) 142/68  Pulse: 94  SpO2: 97%  Weight: 177 lb (80.3 kg)  Height: 5\' 1"  (1.549 m)    GEN- The patient is elderly and frail appearing, alert  Head- normocephalic, atraumatic Eyes-  Sclera clear, conjunctiva pink Ears- hearing intact Oropharynx- clear Lungs- Clear to ausculation bilaterally, normal  work of breathing Chest- pacemaker pocket is well healed Heart- Regular rate and rhythm, no murmurs, rubs or gallops, PMI not laterally displaced GI- soft, NT, ND, + BS Extremities- no clubbing, cyanosis, or edema Psych- flat affect  Pacemaker interrogation- reviewed in detail today,  See PACEART report  ekg tracing ordered today is personally reviewed and shows sinus rhythm with V pacing  Assessment and Plan:  1. Symptomatic mobitz II second degree AV block Normal pacemaker function See Pace Art report No changes today  2. HTN Stable No change required today  3. CAD No ischemic symptoms  4. Afib Not a candidate for anticoagulation AF burden remains <1%, longest episode was 8 hours chads2vasc score is 5  5. S/p AVR Overdue to see Dr Nadara Eaton Return to see EP NP yearly  Thompson Grayer MD, [facility] 12/14/2016 2:44 PM

## 2016-12-14 NOTE — Patient Instructions (Signed)
Medication Instructions:  Your physician recommends that you continue on your current medications as directed. Please refer to the Current Medication list given to you today.   Labwork: None ordered   Testing/Procedures: None ordered   Follow-Up: Remote monitoring is used to monitor your Pacemaker from home. This monitoring reduces the number of office visits required to check your device to one time per year. It allows [place] to keep an eye on the functioning of your device to ensure it is working properly. You are scheduled for a device check from home on 12/29/2016. You may send your transmission at any time that day. If you have a wireless device, the transmission will be sent automatically. After your physician reviews your transmission, you will receive a postcard with your next transmission date.  Your physician wants you to follow-up in: 12 months with Chanetta Marshall, NP. You will receive a reminder letter in the mail two months in advance. If you don't receive a letter, please call our office to schedule the follow-up appointment.   Any Other Special Instructions Will Be Listed Below (If Applicable).     If you need a refill on your cardiac medications before your next appointment, please call your pharmacy.

## 2016-12-22 ENCOUNTER — Ambulatory Visit (INDEPENDENT_AMBULATORY_CARE_PROVIDER_SITE_OTHER): Payer: Medicare Other

## 2016-12-22 DIAGNOSIS — Z23 Encounter for immunization: Secondary | ICD-10-CM | POA: Diagnosis not present

## 2016-12-29 ENCOUNTER — Ambulatory Visit (INDEPENDENT_AMBULATORY_CARE_PROVIDER_SITE_OTHER): Payer: Medicare Other | Admitting: *Deleted

## 2016-12-29 DIAGNOSIS — I441 Atrioventricular block, second degree: Secondary | ICD-10-CM

## 2016-12-29 NOTE — Progress Notes (Signed)
Remote pacemaker transmission.   

## 2017-01-01 ENCOUNTER — Encounter: Payer: Self-pay | Admitting: Cardiology

## 2017-01-06 ENCOUNTER — Ambulatory Visit: Payer: Medicare Other | Admitting: Cardiology

## 2017-01-19 LAB — CUP PACEART REMOTE DEVICE CHECK
Battery Remaining Longevity: 90 mo
Battery Voltage: 2.9 V
Brady Statistic AP VP Percent: 7.4 %
Brady Statistic AP VS Percent: 1 %
Brady Statistic AS VP Percent: 92 %
Brady Statistic RA Percent Paced: 7.1 %
Brady Statistic RV Percent Paced: 99 %
Date Time Interrogation Session: 20181009105508
Implantable Lead Implant Date: 20131022
Implantable Lead Location: 753860
Implantable Lead Model: 1944
Implantable Lead Model: 1948
Lead Channel Impedance Value: 660 Ohm
Lead Channel Pacing Threshold Amplitude: 0.375 V
Lead Channel Pacing Threshold Pulse Width: 0.4 ms
Lead Channel Sensing Intrinsic Amplitude: 6.3 mV
Lead Channel Setting Pacing Amplitude: 1.375
Lead Channel Setting Pacing Pulse Width: 0.4 ms
Lead Channel Setting Sensing Sensitivity: 2 mV
MDC IDC LEAD IMPLANT DT: 20131022
MDC IDC LEAD LOCATION: 753859
MDC IDC MSMT BATTERY REMAINING PERCENTAGE: 65 %
MDC IDC MSMT LEADCHNL RA IMPEDANCE VALUE: 460 Ohm
MDC IDC MSMT LEADCHNL RA PACING THRESHOLD AMPLITUDE: 0.375 V
MDC IDC MSMT LEADCHNL RA SENSING INTR AMPL: 5 mV
MDC IDC MSMT LEADCHNL RV PACING THRESHOLD PULSEWIDTH: 0.4 ms
MDC IDC PG IMPLANT DT: 20131022
MDC IDC PG SERIAL: 7393244
MDC IDC SET LEADCHNL RV PACING AMPLITUDE: 0.625
MDC IDC STAT BRADY AS VS PERCENT: 1 %
Pulse Gen Model: 2210

## 2017-01-26 ENCOUNTER — Other Ambulatory Visit: Payer: Self-pay | Admitting: Emergency Medicine

## 2017-01-26 ENCOUNTER — Telehealth: Payer: Self-pay | Admitting: Internal Medicine

## 2017-01-26 MED ORDER — RISPERIDONE 0.25 MG PO TABS: 0.2500 mg | ORAL_TABLET | Freq: Every day | ORAL | 1 refills | Status: DC

## 2017-01-26 NOTE — Telephone Encounter (Signed)
citalopram (CELEXA) 20 MG tablet   Patient is requesting a refill on this medication.

## 2017-01-26 NOTE — Telephone Encounter (Signed)
Please advise, you have not filled this for pt before.

## 2017-01-26 NOTE — Telephone Encounter (Signed)
Ok to fill 

## 2017-01-27 MED ORDER — CITALOPRAM HYDROBROMIDE 20 MG PO TABS: 20.0000 mg | ORAL_TABLET | Freq: Every day | ORAL | 1 refills | Status: DC

## 2017-03-07 NOTE — Progress Notes (Signed)
Subjective:    Patient ID: [name], female    DOB: 1923/07/14, 83 y.o.   MRN: [id number]  HPI    Medications and allergies reviewed with patient and updated if appropriate.  Patient Active Problem List   Diagnosis Date Noted  . Hyperglycemia 09/09/2016  . CKD (chronic kidney disease) stage 3, GFR 30-59 ml/min (HCC) 09/10/2015  . Arthritis of right hip, severe 09/10/2015  . Pacemaker-St.Jude 01/13/2012  . Second degree Mobitz II AV block 01/11/2012  . CAROTID STENOSIS 11/29/2008  . Hearing loss 10/12/2008  . Osteoporosis 05/08/2008  . AORTIC VALVE REPLACEMENT, HX OF 05/08/2008  . Hyperlipidemia 04/13/2007  . Mild dementia 04/13/2007  . Coronary atherosclerosis 04/13/2007  . Paroxysmal atrial fibrillation ([place]) 04/13/2007  . DIVERTICULOSIS OF COLON 04/13/2007  . Anxiety state 01/18/2007  . Essential hypertension 01/18/2007    Current Outpatient Medications on File Prior to Visit  Medication Sig Dispense Refill  . acetaminophen (TYLENOL) 325 MG tablet Take 650 mg by mouth every 6 (six) hours as needed (pain).     Marland Kitchen amLODipine (NORVASC) 5 MG tablet TAKE ONE TABLET BY MOUTH ONCE DAILY 90 tablet 1  . aspirin 325 MG EC tablet Take 325 mg by mouth daily with breakfast.    . betamethasone dipropionate 0.05 % lotion Apply 1 application topically 2 (two) times daily as needed (psoriasis).  60 mL 0  . calcium carbonate (TUMS - DOSED IN MG ELEMENTAL CALCIUM) 500 MG chewable tablet Chew 2 tablets by mouth daily as needed for heartburn.     . cholecalciferol (VITAMIN D) 1000 UNITS tablet Take 1,000 Units by mouth daily with breakfast.     . citalopram (CELEXA) 20 MG tablet Take 1 tablet (20 mg total) daily at 6 PM by mouth. 30 tablet 1  . folic acid (FOLVITE) 1 MG tablet TAKE 1 TABLET BY MOUTH ONCE DAILY 90 tablet 1  . furosemide (LASIX) 40 MG tablet TAKE TWO TABLETS BY MOUTH ONCE DAILY 180 tablet 1  . LORazepam (ATIVAN) 0.5 MG tablet Take 0.5 tablets (0.25 mg total) by mouth 2  (two) times daily as needed for anxiety. 20 tablet 0  . mineral oil enema Place 1 enema rectally daily as needed (Bowel health).     . polyethylene glycol powder (MIRALAX) powder Take 17 g by mouth daily as needed (constipation).     . pravastatin (PRAVACHOL) 40 MG tablet TAKE ONE TABLET BY MOUTH AT BEDTIME 90 tablet 1  . risperiDONE (RISPERDAL) 0.25 MG tablet Take 1 tablet (0.25 mg total) at bedtime by mouth. 30 tablet 1  . senna-docusate (SENOKOT-S) 8.6-50 MG per tablet Take 2 tablets by mouth daily as needed for mild constipation.      No current facility-administered medications on file prior to visit.     Past Medical History:  Diagnosis Date  . Anemia, unspecified   . Anxiety state, unspecified   . Atrial fibrillation (HCC)    not anticoagulation candidate due to falls  . CAD (coronary artery disease)    s/p CABG  . Carotid artery stenosis   . Complete heart block ([place]) 12/2011   s/p St.Jude dual chamber PPM 01/12/12  . Dementia   . Diverticulosis of colon (without mention of hemorrhage)   . Esophageal reflux   . HTN (hypertension)   . Hyperlipidemia   . Osteoarthrosis, unspecified whether generalized or localized, unspecified site   . Osteoporosis, unspecified   . Pacemaker-St.Jude 01/13/2012  . S/P AVR (aortic valve replacement) 2003  bioprosthetic   . Thoracic aortic aneurysm ([place]) 2003   s/p repair  . Unspecified chronic bronchitis ([place])     Past Surgical History:  Procedure Laterality Date  . AORTIC VALVE REPLACEMENT  2003  . CATARACT EXTRACTION     bilateral  . CORONARY ARTERY BYPASS GRAFT    . INTRAOCULAR LENS INSERTION     bilateral  . PACEMAKER INSERTION  01/12/12   SJM Accent DR RF implanted by Dr Rayann Heman for CHB  . PERMANENT PACEMAKER INSERTION N/A 01/12/2012   Procedure: PERMANENT PACEMAKER INSERTION;  Surgeon: Thompson Grayer, MD;  Location: [facility] CATH LAB;  Service: Cardiovascular;  Laterality: N/A;  . [place]   left  .  VESICOVAGINAL FISTULA CLOSURE W/ TAH      Social History   Socioeconomic History  . Marital status: Married    Spouse name: [name]  . Number of children: Not on file  . Years of education: Not on file  . Highest education level: Not on file  Social Needs  . Financial resource strain: Not on file  . Food insecurity - worry: Not on file  . Food insecurity - inability: Not on file  . Transportation needs - medical: Not on file  . Transportation needs - non-medical: Not on file  Occupational History  . Occupation: retired  Tobacco Use  . Smoking status: Never Smoker  . Smokeless tobacco: Never Used  Substance and Sexual Activity  . Alcohol use: No  . Drug use: No  . Sexual activity: Not on file  Other Topics Concern  . Not on file  Social History Narrative   Lives with husband. Mother had kidney problems.  Father had coronary artery  disease.  Both died when the patient was young.  She does not know how old  they were.  She has one brother who died at age 52 of a CVA, although, he  had coronary artery disease and diabetes.  Another brother died at age 68 of  a stroke.  He also had coronary artery disease.  One sister is living, she  has coronary artery disease and diabetes and one sister has anginal symptoms. Her sister has a pacemaker (history palpitations).           Family History  Problem Relation Age of Onset  . Diabetes Brother   . Diabetes Sister     Review of Systems     Objective:  There were no vitals filed for this visit. Wt Readings from Last 3 Encounters:  12/14/16 177 lb (80.3 kg)  09/09/16 178 lb (80.7 kg)  04/01/16 178 lb (80.7 kg)   There is no height or weight on file to calculate BMI.   Physical Exam          Assessment & Plan:    See Problem List for Assessment and Plan of chronic medical problems.   This encounter was created in error - please disregard.

## 2017-03-08 ENCOUNTER — Encounter: Payer: Medicare Other | Admitting: Internal Medicine

## 2017-03-09 NOTE — Progress Notes (Signed)
HPI: FU coronary disease status post bypassing graft, history of thoracic aortic aneurysm repair, as well as aortic valve replacement and paroxysmal atrial fibrillation. Last Myoview was performed on September 02, 2006. At that time she had normal perfusion with an ejection fraction of 76%. Carotid Dopplers performed in Sept 2011 revealed normal carotids. Note she has also had problems with dementia with psychotic features. The patient was found to have symptomatic high degree AV block in October of 2013 and had a pacemaker placed. Echocardiogram April 2014 showed normal LV function, moderate left ventricular hypertrophy, bioprosthetic aortic valve with a mean gradient of 20 mmHg and mild left atrial enlargement. Since last seen, patient denies dyspnea, chest pain, palpitations or syncope.  Current Outpatient Medications  Medication Sig Dispense Refill  . acetaminophen (TYLENOL) 325 MG tablet Take 650 mg by mouth every 6 (six) hours as needed (pain).     Marland Kitchen amLODipine (NORVASC) 5 MG tablet TAKE ONE TABLET BY MOUTH ONCE DAILY 90 tablet 1  . aspirin 325 MG EC tablet Take 325 mg by mouth daily with breakfast.    . betamethasone dipropionate 0.05 % lotion Apply 1 application topically 2 (two) times daily as needed (psoriasis).  60 mL 0  . calcium carbonate (TUMS - DOSED IN MG ELEMENTAL CALCIUM) 500 MG chewable tablet Chew 2 tablets by mouth daily as needed for heartburn.     . cholecalciferol (VITAMIN D) 1000 UNITS tablet Take 1,000 Units by mouth daily with breakfast.     . citalopram (CELEXA) 20 MG tablet Take 1 tablet (20 mg total) daily at 6 PM by mouth. 30 tablet 1  . folic acid (FOLVITE) 1 MG tablet TAKE 1 TABLET BY MOUTH ONCE DAILY 90 tablet 1  . furosemide (LASIX) 40 MG tablet TAKE TWO TABLETS BY MOUTH ONCE DAILY 180 tablet 1  . LORazepam (ATIVAN) 0.5 MG tablet Take 0.5 tablets (0.25 mg total) by mouth 2 (two) times daily as needed for anxiety. 20 tablet 0  . mineral oil enema Place 1 enema  rectally daily as needed (Bowel health).     . polyethylene glycol powder (MIRALAX) powder Take 17 g by mouth daily as needed (constipation).     . pravastatin (PRAVACHOL) 40 MG tablet TAKE ONE TABLET BY MOUTH AT BEDTIME 90 tablet 1  . risperiDONE (RISPERDAL) 0.25 MG tablet Take 1 tablet (0.25 mg total) at bedtime by mouth. 30 tablet 1  . senna-docusate (SENOKOT-S) 8.6-50 MG per tablet Take 2 tablets by mouth daily as needed for mild constipation.      No current facility-administered medications for this visit.      Past Medical History:  Diagnosis Date  . Anemia, unspecified   . Anxiety state, unspecified   . Atrial fibrillation (HCC)    not anticoagulation candidate due to falls  . CAD (coronary artery disease)    s/p CABG  . Carotid artery stenosis   . Complete heart block ([place]) 12/2011   s/p St.Jude dual chamber PPM 01/12/12  . Dementia   . Diverticulosis of colon (without mention of hemorrhage)   . Esophageal reflux   . HTN (hypertension)   . Hyperlipidemia   . Osteoarthrosis, unspecified whether generalized or localized, unspecified site   . Osteoporosis, unspecified   . Pacemaker-St.Jude 01/13/2012  . S/P AVR (aortic valve replacement) 2003   bioprosthetic   . Thoracic aortic aneurysm ([place]) 2003   s/p repair  . Unspecified chronic bronchitis ([place])     Past Surgical History:  Procedure Laterality Date  . AORTIC VALVE REPLACEMENT  2003  . CATARACT EXTRACTION     bilateral  . CORONARY ARTERY BYPASS GRAFT    . INTRAOCULAR LENS INSERTION     bilateral  . PACEMAKER INSERTION  01/12/12   SJM Accent DR RF implanted by Dr Rayann Heman for CHB  . PERMANENT PACEMAKER INSERTION N/A 01/12/2012   Procedure: PERMANENT PACEMAKER INSERTION;  Surgeon: Thompson Grayer, MD;  Location: [facility] CATH LAB;  Service: Cardiovascular;  Laterality: N/A;  . [place]   left  . VESICOVAGINAL FISTULA CLOSURE W/ TAH      Social History   Socioeconomic History  . Marital status:  Married    Spouse name: [name]  . Number of children: Not on file  . Years of education: Not on file  . Highest education level: Not on file  Social Needs  . Financial resource strain: Not on file  . Food insecurity - worry: Not on file  . Food insecurity - inability: Not on file  . Transportation needs - medical: Not on file  . Transportation needs - non-medical: Not on file  Occupational History  . Occupation: retired  Tobacco Use  . Smoking status: Never Smoker  . Smokeless tobacco: Never Used  Substance and Sexual Activity  . Alcohol use: No  . Drug use: No  . Sexual activity: Not on file  Other Topics Concern  . Not on file  Social History Narrative   Lives with husband. Mother had kidney problems.  Father had coronary artery  disease.  Both died when the patient was young.  She does not know how old  they were.  She has one brother who died at age 52 of a CVA, although, he  had coronary artery disease and diabetes.  Another brother died at age 81 of  a stroke.  He also had coronary artery disease.  One sister is living, she  has coronary artery disease and diabetes and one sister has anginal symptoms. Her sister has a pacemaker (history palpitations).           Family History  Problem Relation Age of Onset  . Diabetes Brother   . Diabetes Sister     ROS: no fevers or chills, productive cough, hemoptysis, dysphasia, odynophagia, melena, hematochezia, dysuria, hematuria, rash, seizure activity, orthopnea, PND, pedal edema, claudication. Remaining systems are negative.  Physical Exam: Well-developed well-nourished in no acute distress.  Skin is warm and dry.  HEENT is normal.  Neck is supple.  Chest is clear to auscultation with normal expansion.  Cardiovascular exam is regular rate and rhythm. 2/6 systolic murmur left sternal border. No diastolic murmur Abdominal exam nontender or distended. No masses palpated. Extremities show no edema. neuro grossly  intact   A/P  1 status post aortic valve replacement-continue SBE prophylaxis.  2 hypertension-blood pressure is controlled. Continue present medications. Check k and renal function.   3 hyperlipidemia-continue statin.  4 paroxysmal atrial fibrillation-patient remains in sinus rhythm on exam. She is not on anticoagulation given history of confusion and fall risk.  5 coronary artery disease-continue aspirin and statin.  6 status post pacemaker-followed by electrophysiology.  Kirk Ruths, MD

## 2017-03-18 ENCOUNTER — Encounter: Payer: Self-pay | Admitting: Cardiology

## 2017-03-18 ENCOUNTER — Ambulatory Visit (INDEPENDENT_AMBULATORY_CARE_PROVIDER_SITE_OTHER): Payer: Medicare Other | Admitting: Cardiology

## 2017-03-18 VITALS — BP 132/64 | HR 88 | Ht 61.0 in | Wt 174.0 lb

## 2017-03-18 DIAGNOSIS — I10 Essential (primary) hypertension: Secondary | ICD-10-CM

## 2017-03-18 DIAGNOSIS — E7800 Pure hypercholesterolemia, unspecified: Secondary | ICD-10-CM | POA: Diagnosis not present

## 2017-03-18 DIAGNOSIS — I2510 Atherosclerotic heart disease of native coronary artery without angina pectoris: Secondary | ICD-10-CM | POA: Diagnosis not present

## 2017-03-18 DIAGNOSIS — Z952 Presence of prosthetic heart valve: Secondary | ICD-10-CM

## 2017-03-18 DIAGNOSIS — I480 Paroxysmal atrial fibrillation: Secondary | ICD-10-CM | POA: Diagnosis not present

## 2017-03-18 NOTE — Patient Instructions (Signed)
Medication Instructions:   NO CHANGE  Labwork:  Your physician recommends that you HAVE LAB WORK TODAY  Follow-Up:  Your physician wants you to follow-up in: ONE YEAR WITH DR CRENSHAW You will receive a reminder letter in the mail two months in advance. If you don't receive a letter, please call our office to schedule the follow-up appointment.   If you need a refill on your cardiac medications before your next appointment, please call your pharmacy.    

## 2017-03-19 LAB — BASIC METABOLIC PANEL
BUN/Creatinine Ratio: 18 (ref 12–28)
BUN: 22 mg/dL (ref 10–36)
CALCIUM: 9.3 mg/dL (ref 8.7–10.3)
CHLORIDE: 104 mmol/L (ref 96–106)
CO2: 21 mmol/L (ref 20–29)
Creatinine, Ser: 1.24 mg/dL — ABNORMAL HIGH (ref 0.57–1.00)
GFR calc Af Amer: 44 mL/min/{1.73_m2} — ABNORMAL LOW (ref >59)
GFR calc non Af Amer: 38 mL/min/{1.73_m2} — ABNORMAL LOW (ref >59)
Glucose: 112 mg/dL — ABNORMAL HIGH (ref 65–99)
POTASSIUM: 4.1 mmol/L (ref 3.5–5.2)
Sodium: 145 mmol/L — ABNORMAL HIGH (ref 134–144)

## 2017-03-30 ENCOUNTER — Ambulatory Visit (INDEPENDENT_AMBULATORY_CARE_PROVIDER_SITE_OTHER): Payer: Medicare Other | Admitting: *Deleted

## 2017-03-30 ENCOUNTER — Telehealth: Payer: Self-pay | Admitting: Cardiology

## 2017-03-30 DIAGNOSIS — I441 Atrioventricular block, second degree: Secondary | ICD-10-CM | POA: Diagnosis not present

## 2017-03-30 NOTE — Telephone Encounter (Signed)
LMOVM reminding pt to send remote transmission.   

## 2017-03-31 ENCOUNTER — Encounter: Payer: Self-pay | Admitting: Cardiology

## 2017-03-31 NOTE — Progress Notes (Signed)
Remote pacemaker transmission.   

## 2017-04-03 NOTE — Progress Notes (Signed)
Subjective:    Patient ID: [name], female    DOB: 1923-07-12, 84 y.o.   MRN: [id number]  HPI The patient is here for follow up.  She is here with her husband who helps with the history.  She does have some memory difficulties.  CAD, Afib, Hypertension: She is taking her medication daily. She is compliant with a low sodium diet.  She denies chest pain, shortness of breath and regular headaches. She is not exercising regularly.  She does not monitor her blood pressure at home.    Hyperlipidemia: She is taking her medication daily. She is compliant with a low fat/cholesterol diet. She is not exercising regularly. She denies myalgias.   Prediabetes:  She is compliant with a low sugar/carbohydrate diet.  She is exercising regularly.  Anxiety: She is taking the citalopram daily as prescribed.  She uses Ativan rarely.  Overall she feels her anxiety is controlled and she is happy with her current medication.  Osteoporosis: Last bone density 09/2014.  Her husband agrees to have a bone density.  He states if she needs it medication would be a good idea.  She does use a walker to ambulate.  Mild dementia: She has some agitation and was seeing a psychiatrist in the past, but was very stable and he did not feel she needed to see him any longer.  She takes Risperdal nightly.  She uses Ativan on a rare occasion.  Medications and allergies reviewed with patient and updated if appropriate.  Patient Active Problem List   Diagnosis Date Noted  . Hyperglycemia 09/09/2016  . CKD (chronic kidney disease) stage 3, GFR 30-59 ml/min (HCC) 09/10/2015  . Arthritis of right hip, severe 09/10/2015  . Pacemaker-St.Jude 01/13/2012  . Second degree Mobitz II AV block 01/11/2012  . CAROTID STENOSIS 11/29/2008  . Hearing loss 10/12/2008  . Osteoporosis 05/08/2008  . AORTIC VALVE REPLACEMENT, HX OF 05/08/2008  . Hyperlipidemia 04/13/2007  . Mild dementia 04/13/2007  . Coronary atherosclerosis 04/13/2007  .  Paroxysmal atrial fibrillation ([place]) 04/13/2007  . DIVERTICULOSIS OF COLON 04/13/2007  . Anxiety state 01/18/2007  . Essential hypertension 01/18/2007    Current Outpatient Medications on File Prior to Visit  Medication Sig Dispense Refill  . acetaminophen (TYLENOL) 325 MG tablet Take 650 mg by mouth every 6 (six) hours as needed (pain).     Marland Kitchen amLODipine (NORVASC) 5 MG tablet TAKE ONE TABLET BY MOUTH ONCE DAILY 90 tablet 1  . aspirin 325 MG EC tablet Take 325 mg by mouth daily with breakfast.    . betamethasone dipropionate 0.05 % lotion Apply 1 application topically 2 (two) times daily as needed (psoriasis).  60 mL 0  . calcium carbonate (TUMS - DOSED IN MG ELEMENTAL CALCIUM) 500 MG chewable tablet Chew 2 tablets by mouth daily as needed for heartburn.     . cholecalciferol (VITAMIN D) 1000 UNITS tablet Take 1,000 Units by mouth daily with breakfast.     . citalopram (CELEXA) 20 MG tablet Take 1 tablet (20 mg total) daily at 6 PM by mouth. 30 tablet 1  . folic acid (FOLVITE) 1 MG tablet TAKE 1 TABLET BY MOUTH ONCE DAILY 90 tablet 1  . furosemide (LASIX) 40 MG tablet TAKE TWO TABLETS BY MOUTH ONCE DAILY 180 tablet 1  . LORazepam (ATIVAN) 0.5 MG tablet Take 0.5 tablets (0.25 mg total) by mouth 2 (two) times daily as needed for anxiety. 20 tablet 0  . mineral oil enema Place 1 enema  rectally daily as needed (Bowel health).     . polyethylene glycol powder (MIRALAX) powder Take 17 g by mouth daily as needed (constipation).     . pravastatin (PRAVACHOL) 40 MG tablet TAKE ONE TABLET BY MOUTH AT BEDTIME 90 tablet 1  . risperiDONE (RISPERDAL) 0.25 MG tablet Take 1 tablet (0.25 mg total) at bedtime by mouth. 30 tablet 1  . senna-docusate (SENOKOT-S) 8.6-50 MG per tablet Take 2 tablets by mouth daily as needed for mild constipation.      No current facility-administered medications on file prior to visit.     Past Medical History:  Diagnosis Date  . Anemia, unspecified   . Anxiety state,  unspecified   . Atrial fibrillation (HCC)    not anticoagulation candidate due to falls  . CAD (coronary artery disease)    s/p CABG  . Carotid artery stenosis   . Complete heart block ([place]) 12/2011   s/p St.Jude dual chamber PPM 01/12/12  . Dementia   . Diverticulosis of colon (without mention of hemorrhage)   . Esophageal reflux   . HTN (hypertension)   . Hyperlipidemia   . Osteoarthrosis, unspecified whether generalized or localized, unspecified site   . Osteoporosis, unspecified   . Pacemaker-St.Jude 01/13/2012  . S/P AVR (aortic valve replacement) 2003   bioprosthetic   . Thoracic aortic aneurysm ([place]) 2003   s/p repair  . Unspecified chronic bronchitis ([place])     Past Surgical History:  Procedure Laterality Date  . AORTIC VALVE REPLACEMENT  2003  . CATARACT EXTRACTION     bilateral  . CORONARY ARTERY BYPASS GRAFT    . INTRAOCULAR LENS INSERTION     bilateral  . PACEMAKER INSERTION  01/12/12   SJM Accent DR RF implanted by Dr Rayann Heman for CHB  . PERMANENT PACEMAKER INSERTION N/A 01/12/2012   Procedure: PERMANENT PACEMAKER INSERTION;  Surgeon: Thompson Grayer, MD;  Location: [facility] CATH LAB;  Service: Cardiovascular;  Laterality: N/A;  . [place]   left  . VESICOVAGINAL FISTULA CLOSURE W/ TAH      Social History   Socioeconomic History  . Marital status: Married    Spouse name: [name]  . Number of children: None  . Years of education: None  . Highest education level: None  Social Needs  . Financial resource strain: None  . Food insecurity - worry: None  . Food insecurity - inability: None  . Transportation needs - medical: None  . Transportation needs - non-medical: None  Occupational History  . Occupation: retired  Tobacco Use  . Smoking status: Never Smoker  . Smokeless tobacco: Never Used  Substance and Sexual Activity  . Alcohol use: No  . Drug use: No  . Sexual activity: None  Other Topics Concern  . None  Social History Narrative    Lives with husband. Mother had kidney problems.  Father had coronary artery  disease.  Both died when the patient was young.  She does not know how old  they were.  She has one brother who died at age 43 of a CVA, although, he  had coronary artery disease and diabetes.  Another brother died at age 82 of  a stroke.  He also had coronary artery disease.  One sister is living, she  has coronary artery disease and diabetes and one sister has anginal symptoms. Her sister has a pacemaker (history palpitations).           Family History  Problem Relation Age of  Onset  . Diabetes Brother   . Diabetes Sister     Review of Systems  Constitutional: Negative for appetite change, chills and fever.  Respiratory: Negative for cough, shortness of breath and wheezing.   Cardiovascular: Positive for palpitations (occasional) and leg swelling (mild). Negative for chest pain.  Gastrointestinal: Negative for abdominal pain.  Neurological: Negative for dizziness, light-headedness and headaches.  Psychiatric/Behavioral: Negative for dysphoric mood and sleep disturbance. The patient is not nervous/anxious.        Objective:   Vitals:   04/05/17 1411  BP: 126/72  Pulse: 81  Resp: 16  Temp: 98.3 F (36.8 C)  SpO2: 97%   Wt Readings from Last 3 Encounters:  04/05/17 175 lb (79.4 kg)  03/18/17 174 lb (78.9 kg)  12/14/16 177 lb (80.3 kg)   Body mass index is 33.07 kg/m.   Physical Exam    Constitutional: Appears well-developed and well-nourished. No distress.  HENT:  Head: Normocephalic and atraumatic.  Neck: Neck supple. No tracheal deviation present. No thyromegaly present.  No cervical lymphadenopathy Cardiovascular: Normal rate, regular rhythm and normal heart sounds.   No murmur heard. No carotid bruit .  No edema Pulmonary/Chest: Effort normal and breath sounds normal. No respiratory distress. No has no wheezes. No rales.  Skin: Skin is warm and dry. Not diaphoretic.  Psychiatric: Normal  mood and affect. Behavior is normal.      Assessment & Plan:    See Problem List for Assessment and Plan of chronic medical problems.

## 2017-04-03 NOTE — Patient Instructions (Addendum)
  Test(s) ordered today. Your results will be released to MyChart (or called to you) after review, usually within 72hours after test completion. If any changes need to be made, you will be notified at that same time.  Medications reviewed and updated.  No changes recommended at this time.    Please followup in 6 months   

## 2017-04-04 ENCOUNTER — Other Ambulatory Visit: Payer: Self-pay | Admitting: Internal Medicine

## 2017-04-05 ENCOUNTER — Ambulatory Visit (INDEPENDENT_AMBULATORY_CARE_PROVIDER_SITE_OTHER): Payer: Medicare Other | Admitting: Internal Medicine

## 2017-04-05 ENCOUNTER — Encounter: Payer: Self-pay | Admitting: Internal Medicine

## 2017-04-05 ENCOUNTER — Ambulatory Visit
Admission: RE | Admit: 2017-04-05 | Discharge: 2017-04-05 | Disposition: A | Discharge status: Home / Self Care | Payer: Medicare Other | Source: Ambulatory Visit | Attending: Internal Medicine | Admitting: Internal Medicine

## 2017-04-05 VITALS — BP 126/72 | HR 81 | Temp 98.3°F | Resp 16 | Wt 175.0 lb

## 2017-04-05 DIAGNOSIS — F411 Generalized anxiety disorder: Secondary | ICD-10-CM

## 2017-04-05 DIAGNOSIS — I10 Essential (primary) hypertension: Secondary | ICD-10-CM

## 2017-04-05 DIAGNOSIS — I480 Paroxysmal atrial fibrillation: Secondary | ICD-10-CM | POA: Diagnosis not present

## 2017-04-05 DIAGNOSIS — F03A0 Unspecified dementia, mild, without behavioral disturbance, psychotic disturbance, mood disturbance, and anxiety: Secondary | ICD-10-CM

## 2017-04-05 DIAGNOSIS — M810 Age-related osteoporosis without current pathological fracture: Secondary | ICD-10-CM

## 2017-04-05 DIAGNOSIS — I2510 Atherosclerotic heart disease of native coronary artery without angina pectoris: Secondary | ICD-10-CM

## 2017-04-05 DIAGNOSIS — E7849 Other hyperlipidemia: Secondary | ICD-10-CM

## 2017-04-05 DIAGNOSIS — F0390 Unspecified dementia without behavioral disturbance: Secondary | ICD-10-CM | POA: Diagnosis not present

## 2017-04-05 DIAGNOSIS — R739 Hyperglycemia, unspecified: Secondary | ICD-10-CM | POA: Diagnosis not present

## 2017-04-05 LAB — CUP PACEART REMOTE DEVICE CHECK
Battery Remaining Longevity: 89 mo
Battery Remaining Percentage: 65 %
Battery Voltage: 2.9 V
Brady Statistic AP VS Percent: 1 %
Brady Statistic RA Percent Paced: 13 %
Brady Statistic RV Percent Paced: 99 %
Implantable Lead Implant Date: 20131022
Implantable Lead Location: 753859
Implantable Lead Location: 753860
Implantable Lead Model: 1944
Implantable Lead Model: 1948
Lead Channel Impedance Value: 450 Ohm
Lead Channel Pacing Threshold Amplitude: 0.375 V
Lead Channel Pacing Threshold Amplitude: 0.5 V
Lead Channel Pacing Threshold Pulse Width: 0.4 ms
Lead Channel Sensing Intrinsic Amplitude: 5 mV
Lead Channel Sensing Intrinsic Amplitude: 5 mV
Lead Channel Setting Sensing Sensitivity: 2 mV
MDC IDC LEAD IMPLANT DT: 20131022
MDC IDC MSMT LEADCHNL RV IMPEDANCE VALUE: 660 Ohm
MDC IDC MSMT LEADCHNL RV PACING THRESHOLD PULSEWIDTH: 0.4 ms
MDC IDC PG IMPLANT DT: 20131022
MDC IDC PG SERIAL: 7393244
MDC IDC SESS DTM: 20190109032937
MDC IDC SET LEADCHNL RA PACING AMPLITUDE: 1.375
MDC IDC SET LEADCHNL RV PACING AMPLITUDE: 0.75 V
MDC IDC SET LEADCHNL RV PACING PULSEWIDTH: 0.4 ms
MDC IDC STAT BRADY AP VP PERCENT: 14 %
MDC IDC STAT BRADY AS VP PERCENT: 86 %
MDC IDC STAT BRADY AS VS PERCENT: 1 %

## 2017-04-05 NOTE — Assessment & Plan Note (Signed)
Up-to-date with cardiology visits No concerning symptoms Continue current medication Check basic blood work including CBC, CMP

## 2017-04-05 NOTE — Assessment & Plan Note (Signed)
Check lipid panel, CMP Continue pravastatin 

## 2017-04-05 NOTE — Assessment & Plan Note (Signed)
Mild prediabetes Check A1c

## 2017-04-05 NOTE — Assessment & Plan Note (Signed)
Has occasional palpitations, but overall asymptomatic Continue current medications Check CBC, CMP, TSH

## 2017-04-05 NOTE — Assessment & Plan Note (Signed)
Overall controlled, stable Continue citalopram 20 mg daily Ativan on a rare occasion Taking risperidone nightly

## 2017-04-05 NOTE — Assessment & Plan Note (Signed)
Stable Has seen psychiatry in the past and has been stable so I am prescribing a medication Risperdal at night-we will continue Takes Ativan on rare occasion Follow-up in 6 months

## 2017-04-05 NOTE — Assessment & Plan Note (Signed)
Blood pressure well controlled Continue current medication CMP, TSH, CBC

## 2017-04-05 NOTE — Assessment & Plan Note (Signed)
dexa ordered Taking vitamin d Will consider fosamax if needed

## 2017-04-09 ENCOUNTER — Other Ambulatory Visit: Payer: Self-pay | Admitting: Internal Medicine

## 2017-04-09 NOTE — Telephone Encounter (Signed)
I spoke with [name]'s husband yesterday. She was unable to complete the Dexa after her appt with you this week. He said she become very agitated and he felt it best to cancel. The family members with him were upset with his decision but he felt he was doing the best for [name] at that moment. He was concerned Dr. Quay Burow would be upset, but I assured him she would not. Questions: Does she need the Dexa, or is their something else that might be an option?

## 2017-04-10 NOTE — Telephone Encounter (Signed)
The dexa scan is xrays of her lower back pain hips to evaluated her bone density.  Is she has osteoporosis she is at risk of breaking a hip or a bone in her back even without falling.  We can start her on medication for this if they wish.    She does not have to do the xrays if she does not want.  She can come some other day to have them done.

## 2017-04-14 NOTE — Telephone Encounter (Signed)
LVM with pts husband to call back and discuss.

## 2017-04-14 NOTE — Telephone Encounter (Signed)
Can you discuss options with patient's husband please.

## 2017-04-15 NOTE — Telephone Encounter (Signed)
We discussed treatment at her last visit - he agreed to fosamax.  One pill once a week.  She must stay pright for 30 minutes after - take w/ full glass of water.  Most common side effect is heartburn   Med pending

## 2017-04-15 NOTE — Telephone Encounter (Signed)
Spoke with pts husband, he would like to go ahead and start pt on medication. Please send back to me before sending med in so I can call him and advise him on directions.

## 2017-04-16 ENCOUNTER — Encounter: Payer: Self-pay | Admitting: Emergency Medicine

## 2017-04-16 MED ORDER — ALENDRONATE SODIUM 70 MG PO TABS: 70.0000 mg | ORAL_TABLET | ORAL | 11 refills | Status: DC

## 2017-04-16 NOTE — Telephone Encounter (Signed)
Spoke with pts husband to give directions for Fosamax. Also mailing letter with directions.

## 2017-04-28 ENCOUNTER — Other Ambulatory Visit: Payer: Self-pay | Admitting: Internal Medicine

## 2017-04-30 ENCOUNTER — Telehealth: Payer: Self-pay | Admitting: Internal Medicine

## 2017-04-30 MED ORDER — CITALOPRAM HYDROBROMIDE 20 MG PO TABS: 20.0000 mg | ORAL_TABLET | Freq: Every day | ORAL | 2 refills | Status: DC

## 2017-04-30 NOTE — Telephone Encounter (Signed)
Reviewed chart pt is up-to-date sent refills to [facility].../lmb  

## 2017-04-30 NOTE — Telephone Encounter (Signed)
Copied from [place] [id number]. Topic: Quick Communication - Rx Refill/Question >> Apr 30, 2017  3:29 PM Lolita Rieger, [place] wrote: Medication: citalopram 20mg    Has the patient contacted their pharmacy?yes   (Agent: If no, request that the patient contact the pharmacy for the refill.)   Preferred Pharmacy (with phone number or street name): [facility] on [address]   Agent: Please be advised that RX refills may take up to 3 business days. We ask that you follow-up with your pharmacy.

## 2017-05-09 ENCOUNTER — Encounter (HOSPITAL_COMMUNITY): Payer: Self-pay

## 2017-05-09 ENCOUNTER — Emergency Department (HOSPITAL_COMMUNITY)
Admission: EM | Admit: 2017-05-09 | Discharge: 2017-05-09 | Disposition: A | Discharge status: Home / Self Care | Payer: Medicare Other | Attending: Emergency Medicine | Admitting: Emergency Medicine

## 2017-05-09 ENCOUNTER — Emergency Department (HOSPITAL_COMMUNITY): Payer: Medicare Other

## 2017-05-09 ENCOUNTER — Other Ambulatory Visit: Payer: Self-pay

## 2017-05-09 DIAGNOSIS — I2510 Atherosclerotic heart disease of native coronary artery without angina pectoris: Secondary | ICD-10-CM | POA: Insufficient documentation

## 2017-05-09 DIAGNOSIS — Z79899 Other long term (current) drug therapy: Secondary | ICD-10-CM | POA: Diagnosis not present

## 2017-05-09 DIAGNOSIS — Z96642 Presence of left artificial hip joint: Secondary | ICD-10-CM | POA: Insufficient documentation

## 2017-05-09 DIAGNOSIS — Z7982 Long term (current) use of aspirin: Secondary | ICD-10-CM | POA: Insufficient documentation

## 2017-05-09 DIAGNOSIS — Z951 Presence of aortocoronary bypass graft: Secondary | ICD-10-CM | POA: Diagnosis not present

## 2017-05-09 DIAGNOSIS — F0390 Unspecified dementia without behavioral disturbance: Secondary | ICD-10-CM | POA: Insufficient documentation

## 2017-05-09 DIAGNOSIS — N183 Chronic kidney disease, stage 3 (moderate): Secondary | ICD-10-CM | POA: Insufficient documentation

## 2017-05-09 DIAGNOSIS — N390 Urinary tract infection, site not specified: Secondary | ICD-10-CM | POA: Diagnosis not present

## 2017-05-09 DIAGNOSIS — Z950 Presence of cardiac pacemaker: Secondary | ICD-10-CM | POA: Insufficient documentation

## 2017-05-09 DIAGNOSIS — I129 Hypertensive chronic kidney disease with stage 1 through stage 4 chronic kidney disease, or unspecified chronic kidney disease: Secondary | ICD-10-CM | POA: Diagnosis not present

## 2017-05-09 DIAGNOSIS — Z7901 Long term (current) use of anticoagulants: Secondary | ICD-10-CM | POA: Diagnosis not present

## 2017-05-09 DIAGNOSIS — R4182 Altered mental status, unspecified: Secondary | ICD-10-CM | POA: Diagnosis not present

## 2017-05-09 DIAGNOSIS — R030 Elevated blood-pressure reading, without diagnosis of hypertension: Secondary | ICD-10-CM | POA: Diagnosis not present

## 2017-05-09 HISTORY — DX: Unspecified dementia, unspecified severity, without behavioral disturbance, psychotic disturbance, mood disturbance, and anxiety: F03.90

## 2017-05-09 LAB — CBC WITH DIFFERENTIAL/PLATELET
BASOS ABS: 0 10*3/uL (ref 0.0–0.1)
BASOS PCT: 0 %
EOS PCT: 1 %
Eosinophils Absolute: 0.1 10*3/uL (ref 0.0–0.7)
HEMATOCRIT: 38.5 % (ref 36.0–46.0)
Hemoglobin: 12.7 g/dL (ref 12.0–15.0)
LYMPHS PCT: 18 %
Lymphs Abs: 1.2 10*3/uL (ref 0.7–4.0)
MCH: 30.4 pg (ref 26.0–34.0)
MCHC: 33 g/dL (ref 30.0–36.0)
MCV: 92.1 fL (ref 78.0–100.0)
Monocytes Absolute: 0.8 10*3/uL (ref 0.1–1.0)
Monocytes Relative: 12 %
NEUTROS ABS: 4.7 10*3/uL (ref 1.7–7.7)
Neutrophils Relative %: 69 %
PLATELETS: 212 10*3/uL (ref 150–400)
RBC: 4.18 MIL/uL (ref 3.87–5.11)
RDW: 15 % (ref 11.5–15.5)
WBC: 6.8 10*3/uL (ref 4.0–10.5)

## 2017-05-09 LAB — BASIC METABOLIC PANEL
Anion gap: 12 (ref 5–15)
BUN: 20 mg/dL (ref 6–20)
CALCIUM: 9.3 mg/dL (ref 8.9–10.3)
CO2: 22 mmol/L (ref 22–32)
Chloride: 105 mmol/L (ref 101–111)
Creatinine, Ser: 1.37 mg/dL — ABNORMAL HIGH (ref 0.44–1.00)
GFR, EST AFRICAN AMERICAN: 38 mL/min — AB (ref >60)
GFR, EST NON AFRICAN AMERICAN: 33 mL/min — AB (ref >60)
GLUCOSE: 105 mg/dL — AB (ref 65–99)
Potassium: 3.4 mmol/L — ABNORMAL LOW (ref 3.5–5.1)
Sodium: 139 mmol/L (ref 135–145)

## 2017-05-09 LAB — URINALYSIS, ROUTINE W REFLEX MICROSCOPIC
Bilirubin Urine: NEGATIVE
Glucose, UA: NEGATIVE mg/dL
HGB URINE DIPSTICK: NEGATIVE
Ketones, ur: NEGATIVE mg/dL
NITRITE: NEGATIVE
Protein, ur: NEGATIVE mg/dL
SPECIFIC GRAVITY, URINE: 1.015 (ref 1.005–1.030)
pH: 6 (ref 5.0–8.0)

## 2017-05-09 MED ORDER — CEPHALEXIN 250 MG PO CAPS: 250.0000 mg | ORAL_CAPSULE | Freq: Three times a day (TID) | ORAL | 0 refills | Status: AC

## 2017-05-09 MED ORDER — CEPHALEXIN 250 MG PO CAPS
250.0000 mg | ORAL_CAPSULE | Freq: Once | ORAL | Status: AC
  Administered 2017-05-09: 250 mg via ORAL
  Filled 2017-05-09: qty 1

## 2017-05-09 NOTE — ED Triage Notes (Signed)
EMS reports called for AMS with anger outburst at home with husband. Hx of dementia, hypertension, heart valve replacement, pacemaker,  BP 164/90 Sp02 98 RA HR 80 Resp 16

## 2017-05-09 NOTE — ED Provider Notes (Signed)
[facility] DEPT Provider Note   CSN: [id number] Arrival date & time: 05/09/17  4008     History   Chief Complaint Chief Complaint  Patient presents with  . Altered Mental Status    HPI [name] is a 84 y.o. female with PMH/o Dementia, HTN who presents for evaluation of AMS per family that has been ongoing for the last week. Family reports that patient has a baseline history of dementia.  They report at baseline, she has memory issues, most notably with short-term they state that patient is normally conversive and is agreeable.  They report over the last week, patient is more combative, angry and using expletives.  He states that one week ago, they had a wheelchair delivered to the house and patient became very confused as to why wheelchair was being delivered.  They feel like that was a trigger point and has been the point which patient has been fixated on for the last week.  Patient does currently live at home with her husband.  They do not have any home care.  Family denies any recent sickness though has been has been in the hospital with pneumonia.  They states she has been eating and drinking appropriately.  Family and patient deny any vomiting, chest pain, abdominal pain.   The history is provided by the patient and a relative.    Past Medical History:  Diagnosis Date  . Anemia, unspecified   . Anxiety state, unspecified   . Atrial fibrillation (HCC)    not anticoagulation candidate due to falls  . CAD (coronary artery disease)    s/p CABG  . Carotid artery stenosis   . Complete heart block ([place]) 12/2011   s/p St.Jude dual chamber PPM 01/12/12  . Dementia   . Dementia 05/09/2017  . Diverticulosis of colon (without mention of hemorrhage)   . Esophageal reflux   . HTN (hypertension)   . Hyperlipidemia   . Osteoarthrosis, unspecified whether generalized or localized, unspecified site   . Osteoporosis, unspecified   . Pacemaker-St.Jude  01/13/2012  . S/P AVR (aortic valve replacement) 2003   bioprosthetic   . Thoracic aortic aneurysm ([place]) 2003   s/p repair  . Unspecified chronic bronchitis [facility])     Patient Active Problem List   Diagnosis Date Noted  . Hyperglycemia 09/09/2016  . CKD (chronic kidney disease) stage 3, GFR 30-59 ml/min (HCC) 09/10/2015  . Arthritis of right hip, severe 09/10/2015  . Pacemaker-St.Jude 01/13/2012  . Second degree Mobitz II AV block 01/11/2012  . CAROTID STENOSIS 11/29/2008  . Hearing loss 10/12/2008  . Osteoporosis 05/08/2008  . AORTIC VALVE REPLACEMENT, HX OF 05/08/2008  . Hyperlipidemia 04/13/2007  . Mild dementia 04/13/2007  . Coronary atherosclerosis 04/13/2007  . Paroxysmal atrial fibrillation ([place]) 04/13/2007  . DIVERTICULOSIS OF COLON 04/13/2007  . Anxiety state 01/18/2007  . Essential hypertension 01/18/2007    Past Surgical History:  Procedure Laterality Date  . AORTIC VALVE REPLACEMENT  2003  . CATARACT EXTRACTION     bilateral  . CORONARY ARTERY BYPASS GRAFT    . INTRAOCULAR LENS INSERTION     bilateral  . PACEMAKER INSERTION  01/12/12   SJM Accent DR RF implanted by Dr Rayann Heman for CHB  . PERMANENT PACEMAKER INSERTION N/A 01/12/2012   Procedure: PERMANENT PACEMAKER INSERTION;  Surgeon: Thompson Grayer, MD;  Location: [facility] CATH LAB;  Service: Cardiovascular;  Laterality: N/A;  . [place]   left  . VESICOVAGINAL FISTULA CLOSURE  W/ TAH      OB History    No data available       Home Medications    Prior to Admission medications   Medication Sig Start Date End Date Taking? Authorizing Provider  acetaminophen (TYLENOL) 325 MG tablet Take 650 mg by mouth every 6 (six) hours as needed (pain).     [provider]  alendronate (FOSAMAX) 70 MG tablet Take 1 tablet (70 mg total) by mouth every 7 (seven) days. Take with a full glass of water on an empty stomach. 04/16/17   Burns, Claudina Lick, MD  amLODipine (NORVASC) 5 MG tablet TAKE 1 TABLET BY  MOUTH ONCE DAILY 04/28/17   Binnie Rail, MD  aspirin 325 MG EC tablet Take 325 mg by mouth daily with breakfast.    [provider]  betamethasone dipropionate 0.05 % lotion Apply 1 application topically 2 (two) times daily as needed (psoriasis).  09/17/14   Rowe Clack, MD  calcium carbonate (TUMS - DOSED IN MG ELEMENTAL CALCIUM) 500 MG chewable tablet Chew 2 tablets by mouth daily as needed for heartburn.     [provider]  cephALEXin (KEFLEX) 250 MG capsule Take 1 capsule (250 mg total) by mouth 3 (three) times daily for 7 days. 05/09/17 05/16/17  Volanda Napoleon, PA-C  cholecalciferol (VITAMIN D) 1000 UNITS tablet Take 1,000 Units by mouth daily with breakfast.     [provider]  citalopram (CELEXA) 20 MG tablet Take 1 tablet (20 mg total) by mouth daily at 6 PM. 04/30/17   Burns, Claudina Lick, MD  folic acid (FOLVITE) 1 MG tablet TAKE 1 TABLET BY MOUTH ONCE DAILY 04/28/17   Binnie Rail, MD  furosemide (LASIX) 40 MG tablet TAKE 2 TABLETS BY MOUTH ONCE DAILY 04/28/17   Binnie Rail, MD  LORazepam (ATIVAN) 0.5 MG tablet Take 0.5 tablets (0.25 mg total) by mouth 2 (two) times daily as needed for anxiety. 01/07/16   Binnie Rail, MD  mineral oil enema Place 1 enema rectally daily as needed (Bowel health).     [provider]  polyethylene glycol powder (MIRALAX) powder Take 17 g by mouth daily as needed (constipation).     [provider]  pravastatin (PRAVACHOL) 40 MG tablet TAKE 1 TABLET BY MOUTH AT BEDTIME 04/28/17   Burns, Claudina Lick, MD  risperiDONE (RISPERDAL) 0.25 MG tablet TAKE 1 TABLET BY MOUTH AT BEDTIME 04/05/17   Burns, Claudina Lick, MD  senna-docusate (SENOKOT-S) 8.6-50 MG per tablet Take 2 tablets by mouth daily as needed for mild constipation.     [provider]    Family History Family History  Problem Relation Age of Onset  . Diabetes Brother   . Diabetes Sister     Social History Social History   Tobacco Use  . Smoking  status: Never Smoker  . Smokeless tobacco: Never Used  Substance Use Topics  . Alcohol use: No  . Drug use: No     Allergies   Coumadin [warfarin sodium]; Clarithromycin; Iohexol; Nsaids; Penicillins; Prednisone; Statins; and Sulfonamide derivatives   Review of Systems Review of Systems  Unable to perform ROS: Dementia     Physical Exam Updated Vital Signs BP (!) 156/101 (BP Location: Left Arm)   Pulse (!) 115   Temp 97.8 F (36.6 C) (Oral)   Resp 16   Ht 5\' 2"  (1.575 m)   Wt 77.6 kg (171 lb)   SpO2 94%   BMI 31.28  kg/m   Physical Exam  Constitutional: She appears well-developed and well-nourished.  HENT:  Head: Normocephalic and atraumatic.  Mouth/Throat: Oropharynx is clear and moist and mucous membranes are normal.  No tenderness to palpation of skull. No deformities or crepitus noted. No open wounds, abrasions or lacerations.   Eyes: Conjunctivae, EOM and lids are normal. Pupils are equal, round, and reactive to light.  Neck: Full passive range of motion without pain.  Cardiovascular: Normal rate, regular rhythm, normal heart sounds and normal pulses. Exam reveals no gallop and no friction rub.  No murmur heard. Pulmonary/Chest: Effort normal and breath sounds normal.  Abdominal: Soft. Normal appearance. There is no tenderness. There is no rigidity and no guarding.  Musculoskeletal: Normal range of motion.  Neurological: She is alert.  Cranial nerves III-XII intact Follows commands, Moves all extremities  5/5 strength to BUE and LLE.  Difficulty assessing strength of right lower extremity secondary to patient's pre-existing hip pain. Sensation intact throughout all major nerve distributions Normal finger to nose. No dysdiadochokinesia. No pronator drift. No gait abnormalities  No slurred speech. No facial droop.  Patient is alert and oriented to person, place, and not tell me the year.  She is able to tell me who the president is.  Skin: Skin is warm and  dry. Capillary refill takes less than 2 seconds.  Psychiatric: She has a normal mood and affect. Her speech is normal.  Nursing note and vitals reviewed.    ED Treatments / Results  Labs (all labs ordered are listed, but only abnormal results are displayed) Labs Reviewed  BASIC METABOLIC PANEL - Abnormal; Notable for the following components:      Result Value   Potassium 3.4 (*)    Glucose, Bld 105 (*)    Creatinine, Ser 1.37 (*)    GFR calc non Af Amer 33 (*)    GFR calc Af Amer 38 (*)    All other components within normal limits  URINALYSIS, ROUTINE W REFLEX MICROSCOPIC - Abnormal; Notable for the following components:   Leukocytes, UA TRACE (*)    Bacteria, UA FEW (*)    Squamous Epithelial / LPF 0-5 (*)    All other components within normal limits  URINE CULTURE  CBC WITH DIFFERENTIAL/PLATELET    EKG  EKG Interpretation None       Radiology Ct Head Wo Contrast  Result Date: 05/09/2017 CLINICAL DATA:  Altered mental status.  History of dementia. EXAM: CT HEAD WITHOUT CONTRAST TECHNIQUE: Contiguous axial images were obtained from the base of the skull through the vertex without intravenous contrast. COMPARISON:  06/22/2008 FINDINGS: Brain: There is no evidence of acute infarct, intracranial hemorrhage, mass, midline shift, or extra-axial fluid collection. There is mild cerebral atrophy, not greater than expected for patient's age. Patchy subcortical and periventricular white matter hypodensities are stable to minimally increased compared to 2010 and nonspecific but compatible with mild-to-moderate chronic small vessel ischemic disease. Vascular: Calcified atherosclerosis at the skull base. No hyperdense vessel. Skull: No fracture or focal osseous lesion. Sinuses/Orbits: Mild mucosal maxillary sinuses thickening in the left sphenoid and left. Clear mastoid air cells. Bilateral cataract extraction. Other: None. IMPRESSION: 1. No acute intracranial abnormality identified. 2.  Mild-to-moderate chronic small vessel ischemic disease. Electronically Signed   By: Logan Bores M.D.   On: 05/09/2017 20:26    Procedures Procedures (including critical care time)  Medications Ordered in ED Medications  cephALEXin (KEFLEX) capsule 250 mg (not administered)     Initial Impression /  Assessment and Plan / ED Course  I have reviewed the triage vital signs and the nursing notes.  Pertinent labs & imaging results that were available during my care of the patient were reviewed by me and considered in my medical decision making (see chart for details).     84 y.o. F with PMH/o Dementia who presents for evaluation of 1 week of progressively worsening altered mental status.  Family reports that at baseline, patient is very agreeable.  They report over the last week, she has been agitated and angry.  They report Most recent symptoms were triggered by some delivering a wheelchair to the house which made patient very confused.  No fevers, vomiting, decreased p.o. Patient is afebrile, non-toxic appearing, sitting comfortably on examination table. Vital signs reviewed and stable.  Reassuring neuro exam.  The only abnormality is some difficulty raising patient's chronic hip pain.  Consider progressively worsening dementia versus acute infectious etiology versus electrolyte abnormality.  Do not suspect CVA at this time but given concerns, will get CT head.  Labs and imaging reviewed.  BMP shows slight hypokalemia.  Creatinine is 1.37.  CBC shows no acute abnormalities.  CT head is without normality.  UA shows trace leukocytes, pyuria. Discussed patient with Dr. Lita Mains. Will plan to treat for UTI.   I discussed with pharmacy regarding antibiotic therapy.  Patient is currently on risperidone.  Pharmacy reviewed patient's medications and allergies.  From [place] he feels that Keflex is an appropriate choice given history.  Updated patient and family on plan.  We will plan to give first dose of  antibiotic here in the department.  We will plan to send patient home with antibiotic for UTI.  Urine culture sent.  Instructed patient to follow-up with her primary care doctor in the next 24-48 hours for further evaluation.  Also instructed family to have her urine repeated to evaluate for worsening infection.  While I think some of this may be attributed to UTI, I also discussed the possibility of worsening dementia.  Patient is followed by neurology.  I encouraged family to have patient follow-up with neurology for further evaluation and determination of further treatment for dementia.  Patient stable for discharge at this time. Patient had ample opportunity for questions and discussion. All patient's questions were answered with full understanding. Patient had ample opportunity for questions and discussion. All patient's questions were answered with full understanding.  Final Clinical Impressions(s) / ED Diagnoses   Final diagnoses:  Urinary tract infection without hematuria, site unspecified    ED Discharge Orders        Ordered    cephALEXin (KEFLEX) 250 MG capsule  3 times daily     05/09/17 2155       Volanda Napoleon, PA-C 05/10/17 0157    Julianne Rice, MD 05/10/17 [phone]

## 2017-05-09 NOTE — ED Notes (Signed)
Bed: IB70 Expected date:  Expected time:  Means of arrival:  Comments: 84 yo anxiety, dementia

## 2017-05-09 NOTE — Discharge Instructions (Signed)
Take antibiotics as directed. Please take all of your antibiotics until finished.  As we discussed, have patient follow-up with your primary care doctor in the next 24-48 hours duration.  Also have them repeat the urine to make sure that her infection is improving.  Return to the emergency department for any worsening confusion, chest pain, difficulty breathing, persistent vomiting, inability to eat or drink any worrisome or concerning symptoms.

## 2017-05-09 NOTE — ED Notes (Signed)
Bed: GY69 Expected date:  Expected time:  Means of arrival:  Comments: Room 25

## 2017-05-12 LAB — URINE CULTURE: Culture: 60000 — AB

## 2017-05-13 ENCOUNTER — Telehealth: Payer: Self-pay | Admitting: Emergency Medicine

## 2017-05-13 NOTE — Telephone Encounter (Signed)
Post ED Visit - Positive Culture Follow-up: Successful Patient Follow-Up  Culture assessed and recommendations reviewed by: []  Elenor Quinones, Pharm.D. []  Heide Guile, Pharm.D., BCPS AQ-ID []  Parks Neptune, Pharm.D., BCPS [x]  Alycia Rossetti, Pharm.D., BCPS []  [place], [place].D., BCPS, AAHIVP []  Legrand Como, Pharm.D., BCPS, AAHIVP []  Salome Arnt, PharmD, BCPS []  Dimitri Ped, PharmD, BCPS []  Vincenza Hews, PharmD, BCPS  Positive urine culture  []  Patient discharged without antimicrobial prescription and treatment is now indicated [x]  Organism is resistant to prescribed ED discharge antimicrobial []  Patient with positive blood cultures  Changes discussed with ED provider: Alferd Apa PA New antibiotic prescription stop Keflex, start fosfomycin 3 g po x 1 dose Called to [place] [phone]  Contacted son 05/13/2017 [place], Laiza Veenstra 05/13/2017, 2:12 PM

## 2017-05-13 NOTE — Progress Notes (Signed)
ED Antimicrobial Stewardship Positive Culture Follow Up   [name] is an 84 y.o. female who presented to [facility] on 05/09/2017 with a chief complaint of  Chief Complaint  Patient presents with  . Altered Mental Status    Recent Results (from the past 720 hour(s))  Urine culture     Status: Abnormal   Collection Time: 05/09/17  8:06 PM  Result Value Ref Range Status   Specimen Description   Final    URINE, CLEAN CATCH Performed at [facility], [place] [address] [place], [place] [address]    Special Requests   Final    NONE Performed at [facility], [place] [address], [place], [place] [address]    Culture 60,000 COLONIES/mL ENTEROBACTER AEROGENES (A)  Final   Report Status 05/12/2017 FINAL  Final   Organism ID, Bacteria ENTEROBACTER AEROGENES (A)  Final      Susceptibility   Enterobacter aerogenes - MIC*    CEFAZOLIN RESISTANT Resistant     CEFTRIAXONE <=1 SENSITIVE Sensitive     CIPROFLOXACIN <=0.25 SENSITIVE Sensitive     GENTAMICIN <=1 SENSITIVE Sensitive     IMIPENEM 1 SENSITIVE Sensitive     NITROFURANTOIN 32 SENSITIVE Sensitive     TRIMETH/SULFA <=20 SENSITIVE Sensitive     PIP/TAZO <=4 SENSITIVE Sensitive     * 60,000 COLONIES/mL ENTEROBACTER AEROGENES    [x]  Treated with Keflex, organism resistant to prescribed antimicrobial []  Patient discharged originally without antimicrobial agent and treatment is now indicated  New antibiotic prescription: Fosfomycin 3g po x 1  ED Provider: Alferd Apa, PA-C   Lawson Radar 05/13/2017, 8:50 AM Infectious Diseases Pharmacist Phone# [phone]

## 2017-05-16 NOTE — Progress Notes (Deleted)
Subjective:    Patient ID: [name], female    DOB: [birth year], 84 y.o.   MRN: [id number]  HPI The patient is here for follow up from the ED.  She went to the ED 05/09/17 for AMS.  She has dementia and per her family she was acting differently for one week.  She was more combative, angry and used expletives.  She was also more confused.  She had a wheelchair delivered and she was very confused as to why it was delivered.  Her family things that may be a trigger point - she became fixated on it.  She was eating and drinking appropriately.  There was no vomiting, chest pain or abdominal pain.  VSS.  Neurological exam was normal.  She had slight hypokalemia.  Cr 1/37.  CBC stable.  CT w/o abnormality.  UA showed trace leukocytes, pyuria.  Treated for UTI.  She was discharged home on keflex.      Medications and allergies reviewed with patient and updated if appropriate.  Patient Active Problem List   Diagnosis Date Noted  . Hyperglycemia 09/09/2016  . CKD (chronic kidney disease) stage 3, GFR 30-59 ml/min (HCC) 09/10/2015  . Arthritis of right hip, severe 09/10/2015  . Pacemaker-St.Jude 01/13/2012  . Second degree Mobitz II AV block 01/11/2012  . CAROTID STENOSIS 11/29/2008  . Hearing loss 10/12/2008  . Osteoporosis 05/08/2008  . AORTIC VALVE REPLACEMENT, HX OF 05/08/2008  . Hyperlipidemia 04/13/2007  . Mild dementia 04/13/2007  . Coronary atherosclerosis 04/13/2007  . Paroxysmal atrial fibrillation ([place]) 04/13/2007  . DIVERTICULOSIS OF COLON 04/13/2007  . Anxiety state 01/18/2007  . Essential hypertension 01/18/2007    Current Outpatient Medications on File Prior to Visit  Medication Sig Dispense Refill  . acetaminophen (TYLENOL) 325 MG tablet Take 650 mg by mouth every 6 (six) hours as needed (pain).     Marland Kitchen alendronate (FOSAMAX) 70 MG tablet Take 1 tablet (70 mg total) by mouth every 7 (seven) days. Take with a full glass of water on an empty stomach. 4 tablet 11  .  amLODipine (NORVASC) 5 MG tablet TAKE 1 TABLET BY MOUTH ONCE DAILY 90 tablet 1  . aspirin 325 MG EC tablet Take 325 mg by mouth daily with breakfast.    . betamethasone dipropionate 0.05 % lotion Apply 1 application topically 2 (two) times daily as needed (psoriasis).  60 mL 0  . calcium carbonate (TUMS - DOSED IN MG ELEMENTAL CALCIUM) 500 MG chewable tablet Chew 2 tablets by mouth daily as needed for heartburn.     . cephALEXin (KEFLEX) 250 MG capsule Take 1 capsule (250 mg total) by mouth 3 (three) times daily for 7 days. 21 capsule 0  . cholecalciferol (VITAMIN D) 1000 UNITS tablet Take 1,000 Units by mouth daily with breakfast.     . citalopram (CELEXA) 20 MG tablet Take 1 tablet (20 mg total) by mouth daily at 6 PM. 30 tablet 2  . folic acid (FOLVITE) 1 MG tablet TAKE 1 TABLET BY MOUTH ONCE DAILY 90 tablet 1  . furosemide (LASIX) 40 MG tablet TAKE 2 TABLETS BY MOUTH ONCE DAILY 180 tablet 1  . LORazepam (ATIVAN) 0.5 MG tablet Take 0.5 tablets (0.25 mg total) by mouth 2 (two) times daily as needed for anxiety. 20 tablet 0  . mineral oil enema Place 1 enema rectally daily as needed (Bowel health).     . polyethylene glycol powder (MIRALAX) powder Take 17 g by mouth daily as needed (  constipation).     . pravastatin (PRAVACHOL) 40 MG tablet TAKE 1 TABLET BY MOUTH AT BEDTIME 90 tablet 1  . risperiDONE (RISPERDAL) 0.25 MG tablet TAKE 1 TABLET BY MOUTH AT BEDTIME 90 tablet 1  . senna-docusate (SENOKOT-S) 8.6-50 MG per tablet Take 2 tablets by mouth daily as needed for mild constipation.      No current facility-administered medications on file prior to visit.     Past Medical History:  Diagnosis Date  . Anemia, unspecified   . Anxiety state, unspecified   . Atrial fibrillation (HCC)    not anticoagulation candidate due to falls  . CAD (coronary artery disease)    s/p CABG  . Carotid artery stenosis   . Complete heart block ([place]) 12/2011   s/p St.Jude dual chamber PPM 01/12/12  . Dementia     . Dementia 05/09/2017  . Diverticulosis of colon (without mention of hemorrhage)   . Esophageal reflux   . HTN (hypertension)   . Hyperlipidemia   . Osteoarthrosis, unspecified whether generalized or localized, unspecified site   . Osteoporosis, unspecified   . Pacemaker-St.Jude 01/13/2012  . S/P AVR (aortic valve replacement) 2003   bioprosthetic   . Thoracic aortic aneurysm ([place]) 2003   s/p repair  . Unspecified chronic bronchitis ([place])     Past Surgical History:  Procedure Laterality Date  . AORTIC VALVE REPLACEMENT  2003  . CATARACT EXTRACTION     bilateral  . CORONARY ARTERY BYPASS GRAFT    . INTRAOCULAR LENS INSERTION     bilateral  . PACEMAKER INSERTION  01/12/12   SJM Accent DR RF implanted by Dr Rayann Heman for CHB  . PERMANENT PACEMAKER INSERTION N/A 01/12/2012   Procedure: PERMANENT PACEMAKER INSERTION;  Surgeon: Thompson Grayer, MD;  Location: [facility] CATH LAB;  Service: Cardiovascular;  Laterality: N/A;  . [place]   left  . VESICOVAGINAL FISTULA CLOSURE W/ TAH      Social History   Socioeconomic History  . Marital status: Married    Spouse name: [name]  . Number of children: Not on file  . Years of education: Not on file  . Highest education level: Not on file  Social Needs  . Financial resource strain: Not on file  . Food insecurity - worry: Not on file  . Food insecurity - inability: Not on file  . Transportation needs - medical: Not on file  . Transportation needs - non-medical: Not on file  Occupational History  . Occupation: retired  Tobacco Use  . Smoking status: Never Smoker  . Smokeless tobacco: Never Used  Substance and Sexual Activity  . Alcohol use: No  . Drug use: No  . Sexual activity: Not on file  Other Topics Concern  . Not on file  Social History Narrative   Lives with husband. Mother had kidney problems.  Father had coronary artery  disease.  Both died when the patient was young.  She does not know how old  they were.   She has one brother who died at age 40 of a CVA, although, he  had coronary artery disease and diabetes.  Another brother died at age 41 of  a stroke.  He also had coronary artery disease.  One sister is living, she  has coronary artery disease and diabetes and one sister has anginal symptoms. Her sister has a pacemaker (history palpitations).           Family History  Problem Relation Age of Onset  .  Diabetes Brother   . Diabetes Sister     Review of Systems     Objective:  There were no vitals filed for this visit. Wt Readings from Last 3 Encounters:  05/09/17 171 lb (77.6 kg)  04/05/17 175 lb (79.4 kg)  03/18/17 174 lb (78.9 kg)   There is no height or weight on file to calculate BMI.   Physical Exam    Constitutional: Appears well-developed and well-nourished. No distress.  HENT:  Head: Normocephalic and atraumatic.  Neck: Neck supple. No tracheal deviation present. No thyromegaly present.  No cervical lymphadenopathy Cardiovascular: Normal rate, regular rhythm and normal heart sounds.   No murmur heard. No carotid bruit .  No edema Pulmonary/Chest: Effort normal and breath sounds normal. No respiratory distress. No has no wheezes. No rales.  Skin: Skin is warm and dry. Not diaphoretic.  Psychiatric: Normal mood and affect. Behavior is normal.   Lab Results  Component Value Date   WBC 6.8 05/09/2017   HGB 12.7 05/09/2017   HCT 38.5 05/09/2017   PLT 212 05/09/2017   GLUCOSE 105 (H) 05/09/2017   CHOL 177 09/09/2016   TRIG 244.0 (H) 09/09/2016   HDL 39.70 09/09/2016   LDLDIRECT 96.0 09/09/2016   LDLCALC 81 09/11/2015   ALT 6 09/09/2016   AST 12 09/09/2016   NA 139 05/09/2017   K 3.4 (L) 05/09/2017   CL 105 05/09/2017   CREATININE 1.37 (H) 05/09/2017   BUN 20 05/09/2017   CO2 22 05/09/2017   TSH 1.83 09/09/2016   INR 1.03 01/11/2012   HGBA1C 5.7 09/09/2016       Assessment & Plan:    See Problem List for Assessment and Plan of chronic medical  problems.

## 2017-05-17 ENCOUNTER — Inpatient Hospital Stay: Payer: Medicare Other | Admitting: Internal Medicine

## 2017-05-25 NOTE — Progress Notes (Signed)
Subjective:    Patient ID: [name], female    DOB: 05-12-1923, 84 y.o.   MRN: [id number]  HPI The patient is here for follow up from the ED.  She went to the ED 05/09/17 for AMS.  She has dementia and per her family she was acting differently for one week.  She was more combative, angry and used expletives.  She was also more confused.  She had a wheelchair delivered and she was very confused as to why it was delivered.  Her family things that may be a trigger point - she became fixated on it.  She was eating and drinking appropriately.  There was no vomiting, chest pain or abdominal pain.  VSS.  Neurological exam was normal.  She had slight hypokalemia.  Cr 1/37.  CBC stable.  CT w/o abnormality.  UA showed trace leukocytes, pyuria.  Treated for UTI.  She was discharged home on keflex.  She is here today with her son and daughter-in-law.  Her husband is currently in a SNF and they are taking care of her.  She is doing well and does not complain of anything.  She has not had any urinary complaints, abdominal pain, fevers or chills.  Her appetite is good.  She did complete the antibiotic.  There has not been any confusion or altered mental status.  She does have a sore on the left side of her nose.  They first noticed that approximately couple of weeks ago.  She does scratch it and that has not healed.  They are unsure if it was there previously.  She denies that it causes any pain.  They are giving her all of her medications as prescribed.  We reviewed each medication and what they were for.  Medications and allergies reviewed with patient and updated if appropriate.  Patient Active Problem List   Diagnosis Date Noted  . Hyperglycemia 09/09/2016  . CKD (chronic kidney disease) stage 3, GFR 30-59 ml/min (HCC) 09/10/2015  . Arthritis of right hip, severe 09/10/2015  . Pacemaker-St.Jude 01/13/2012  . Second degree Mobitz II AV block 01/11/2012  . CAROTID STENOSIS 11/29/2008  . Hearing  loss 10/12/2008  . Osteoporosis 05/08/2008  . AORTIC VALVE REPLACEMENT, HX OF 05/08/2008  . Hyperlipidemia 04/13/2007  . Mild dementia 04/13/2007  . Coronary atherosclerosis 04/13/2007  . Paroxysmal atrial fibrillation ([place]) 04/13/2007  . DIVERTICULOSIS OF COLON 04/13/2007  . Anxiety state 01/18/2007  . Essential hypertension 01/18/2007    Current Outpatient Medications on File Prior to Visit  Medication Sig Dispense Refill  . acetaminophen (TYLENOL) 325 MG tablet Take 650 mg by mouth every 6 (six) hours as needed (pain).     Marland Kitchen alendronate (FOSAMAX) 70 MG tablet Take 1 tablet (70 mg total) by mouth every 7 (seven) days. Take with a full glass of water on an empty stomach. 4 tablet 11  . amLODipine (NORVASC) 5 MG tablet TAKE 1 TABLET BY MOUTH ONCE DAILY 90 tablet 1  . betamethasone dipropionate 0.05 % lotion Apply 1 application topically 2 (two) times daily as needed (psoriasis).  60 mL 0  . calcium carbonate (TUMS - DOSED IN MG ELEMENTAL CALCIUM) 500 MG chewable tablet Chew 2 tablets by mouth daily as needed for heartburn.     . cholecalciferol (VITAMIN D) 1000 UNITS tablet Take 1,000 Units by mouth daily with breakfast.     . citalopram (CELEXA) 20 MG tablet Take 1 tablet (20 mg total) by mouth daily at 6 PM.  30 tablet 2  . folic acid (FOLVITE) 1 MG tablet TAKE 1 TABLET BY MOUTH ONCE DAILY 90 tablet 1  . furosemide (LASIX) 40 MG tablet TAKE 2 TABLETS BY MOUTH ONCE DAILY 180 tablet 1  . mineral oil enema Place 1 enema rectally daily as needed (Bowel health).     . polyethylene glycol powder (MIRALAX) powder Take 17 g by mouth daily as needed (constipation).     . pravastatin (PRAVACHOL) 40 MG tablet TAKE 1 TABLET BY MOUTH AT BEDTIME 90 tablet 1  . risperiDONE (RISPERDAL) 0.25 MG tablet TAKE 1 TABLET BY MOUTH AT BEDTIME 90 tablet 1  . senna-docusate (SENOKOT-S) 8.6-50 MG per tablet Take 2 tablets by mouth daily as needed for mild constipation.     Marland Kitchen aspirin 325 MG EC tablet Take 325 mg by  mouth daily with breakfast.     No current facility-administered medications on file prior to visit.     Past Medical History:  Diagnosis Date  . Anemia, unspecified   . Anxiety state, unspecified   . Atrial fibrillation (HCC)    not anticoagulation candidate due to falls  . CAD (coronary artery disease)    s/p CABG  . Carotid artery stenosis   . Complete heart block ([place]) 12/2011   s/p St.Jude dual chamber PPM 01/12/12  . Dementia   . Dementia 05/09/2017  . Diverticulosis of colon (without mention of hemorrhage)   . Esophageal reflux   . HTN (hypertension)   . Hyperlipidemia   . Osteoarthrosis, unspecified whether generalized or localized, unspecified site   . Osteoporosis, unspecified   . Pacemaker-St.Jude 01/13/2012  . S/P AVR (aortic valve replacement) 2003   bioprosthetic   . Thoracic aortic aneurysm ([place]) 2003   s/p repair  . Unspecified chronic bronchitis ([place])     Past Surgical History:  Procedure Laterality Date  . AORTIC VALVE REPLACEMENT  2003  . CATARACT EXTRACTION     bilateral  . CORONARY ARTERY BYPASS GRAFT    . INTRAOCULAR LENS INSERTION     bilateral  . PACEMAKER INSERTION  01/12/12   SJM Accent DR RF implanted by Dr Rayann Heman for CHB  . PERMANENT PACEMAKER INSERTION N/A 01/12/2012   Procedure: PERMANENT PACEMAKER INSERTION;  Surgeon: Thompson Grayer, MD;  Location: [facility] CATH LAB;  Service: Cardiovascular;  Laterality: N/A;  . [place]   left  . VESICOVAGINAL FISTULA CLOSURE W/ TAH      Social History   Socioeconomic History  . Marital status: Married    Spouse name: [name]  . Number of children: None  . Years of education: None  . Highest education level: None  Social Needs  . Financial resource strain: None  . Food insecurity - worry: None  . Food insecurity - inability: None  . Transportation needs - medical: None  . Transportation needs - non-medical: None  Occupational History  . Occupation: retired  Tobacco Use  .  Smoking status: Never Smoker  . Smokeless tobacco: Never Used  Substance and Sexual Activity  . Alcohol use: No  . Drug use: No  . Sexual activity: None  Other Topics Concern  . None  Social History Narrative   Lives with husband. Mother had kidney problems.  Father had coronary artery  disease.  Both died when the patient was young.  She does not know how old  they were.  She has one brother who died at age 66 of a CVA, although, he  had coronary artery  disease and diabetes.  Another brother died at age 7 of  a stroke.  He also had coronary artery disease.  One sister is living, she  has coronary artery disease and diabetes and one sister has anginal symptoms. Her sister has a pacemaker (history palpitations).           Family History  Problem Relation Age of Onset  . Diabetes Brother   . Diabetes Sister     Review of Systems     Objective:   Vitals:   05/26/17 1310  BP: 138/76  Pulse: 70  Resp: 16  Temp: 98.4 F (36.9 C)  SpO2: 98%   Wt Readings from Last 3 Encounters:  05/26/17 171 lb (77.6 kg)  05/09/17 171 lb (77.6 kg)  04/05/17 175 lb (79.4 kg)   Body mass index is 31.28 kg/m.   Physical Exam    Constitutional: Appears well-developed and well-nourished. No distress.  HENT:  Head: Normocephalic and atraumatic.  Neck: Neck supple. No tracheal deviation present. No thyromegaly present.  No cervical lymphadenopathy Cardiovascular: Normal rate, regular rhythm and normal heart sounds.   2/6 systolic murmur heard.   1+ pitting edema bilateral ankles Pulmonary/Chest: Effort normal and breath sounds normal. No respiratory distress. No has no wheezes. No rales.  Abdomen: Soft, nontender, nondistended Skin: Skin is warm and dry. Not diaphoretic.  Psychiatric: Normal mood and affect. Behavior is normal.   Lab Results  Component Value Date   WBC 6.8 05/09/2017   HGB 12.7 05/09/2017   HCT 38.5 05/09/2017   PLT 212 05/09/2017   GLUCOSE 105 (H) 05/09/2017   CHOL  177 09/09/2016   TRIG 244.0 (H) 09/09/2016   HDL 39.70 09/09/2016   LDLDIRECT 96.0 09/09/2016   LDLCALC 81 09/11/2015   ALT 6 09/09/2016   AST 12 09/09/2016   NA 139 05/09/2017   K 3.4 (L) 05/09/2017   CL 105 05/09/2017   CREATININE 1.37 (H) 05/09/2017   BUN 20 05/09/2017   CO2 22 05/09/2017   TSH 1.83 09/09/2016   INR 1.03 01/11/2012   HGBA1C 5.7 09/09/2016       Assessment & Plan:    See Problem List for Assessment and Plan of chronic medical problems.

## 2017-05-26 ENCOUNTER — Ambulatory Visit (INDEPENDENT_AMBULATORY_CARE_PROVIDER_SITE_OTHER): Payer: Medicare Other | Admitting: Internal Medicine

## 2017-05-26 ENCOUNTER — Encounter: Payer: Self-pay | Admitting: Internal Medicine

## 2017-05-26 DIAGNOSIS — E7849 Other hyperlipidemia: Secondary | ICD-10-CM

## 2017-05-26 DIAGNOSIS — I2510 Atherosclerotic heart disease of native coronary artery without angina pectoris: Secondary | ICD-10-CM

## 2017-05-26 DIAGNOSIS — F411 Generalized anxiety disorder: Secondary | ICD-10-CM

## 2017-05-26 DIAGNOSIS — I480 Paroxysmal atrial fibrillation: Secondary | ICD-10-CM

## 2017-05-26 DIAGNOSIS — N183 Chronic kidney disease, stage 3 unspecified: Secondary | ICD-10-CM

## 2017-05-26 DIAGNOSIS — M810 Age-related osteoporosis without current pathological fracture: Secondary | ICD-10-CM | POA: Diagnosis not present

## 2017-05-26 DIAGNOSIS — I10 Essential (primary) hypertension: Secondary | ICD-10-CM

## 2017-05-26 NOTE — Assessment & Plan Note (Signed)
Continue statin given history of coronary artery disease and carotid artery disease

## 2017-05-26 NOTE — Assessment & Plan Note (Signed)
Denies any chest pain or shortness of breath Following with cardiology annually Continue current medications

## 2017-05-26 NOTE — Assessment & Plan Note (Signed)
Chronic, stable Overall has remained stable-slight worsening while in the hospital for UTI We will continue to monitor every 6 months Her family is trying to encourage her to drink more water Has chronic leg edema-taking 80 mg of Lasix daily-continue

## 2017-05-26 NOTE — Assessment & Plan Note (Signed)
Denies any symptoms Following with cardiology Continue current medications

## 2017-05-26 NOTE — Assessment & Plan Note (Signed)
Blood pressure overall has been controlled we will continue her current medications at current doses

## 2017-05-26 NOTE — Assessment & Plan Note (Signed)
Taking calcium, vitamin D and Fosamax We will continue

## 2017-05-26 NOTE — Assessment & Plan Note (Signed)
Taking citalopram daily, risperidone at night No anxiety No confusion since her urinary tract infection and hospitalization Continue current medications

## 2017-05-26 NOTE — Patient Instructions (Signed)
  Medications reviewed and updated.  No changes recommended at this time.     Please followup in 6 months   

## 2017-06-29 ENCOUNTER — Encounter: Payer: Self-pay | Admitting: Cardiology

## 2017-06-29 ENCOUNTER — Ambulatory Visit (INDEPENDENT_AMBULATORY_CARE_PROVIDER_SITE_OTHER): Payer: Medicare Other | Admitting: *Deleted

## 2017-06-29 ENCOUNTER — Telehealth: Payer: Self-pay | Admitting: Cardiology

## 2017-06-29 DIAGNOSIS — I441 Atrioventricular block, second degree: Secondary | ICD-10-CM | POA: Diagnosis not present

## 2017-06-29 NOTE — Telephone Encounter (Signed)
Confirmed remote transmission w/ pt husband.   

## 2017-06-30 NOTE — Progress Notes (Signed)
Remote pacemaker transmission.   

## 2017-07-01 ENCOUNTER — Encounter: Payer: Self-pay | Admitting: Cardiology

## 2017-07-28 LAB — CUP PACEART REMOTE DEVICE CHECK
Battery Remaining Percentage: 65 %
Battery Voltage: 2.9 V
Brady Statistic AP VP Percent: 11 %
Brady Statistic AP VS Percent: 1.6 %
Brady Statistic AS VP Percent: 77 %
Brady Statistic AS VS Percent: 11 %
Implantable Lead Implant Date: 20131022
Implantable Lead Location: 753859
Implantable Pulse Generator Implant Date: 20131022
Lead Channel Impedance Value: 680 Ohm
Lead Channel Pacing Threshold Amplitude: 0.5 V
Lead Channel Pacing Threshold Pulse Width: 0.4 ms
Lead Channel Pacing Threshold Pulse Width: 0.4 ms
Lead Channel Sensing Intrinsic Amplitude: 5 mV
Lead Channel Setting Pacing Amplitude: 0.75 V
Lead Channel Setting Pacing Pulse Width: 0.4 ms
MDC IDC LEAD IMPLANT DT: 20131022
MDC IDC LEAD LOCATION: 753860
MDC IDC MSMT BATTERY REMAINING LONGEVITY: 89 mo
MDC IDC MSMT LEADCHNL RA IMPEDANCE VALUE: 460 Ohm
MDC IDC MSMT LEADCHNL RA PACING THRESHOLD AMPLITUDE: 0.5 V
MDC IDC MSMT LEADCHNL RV SENSING INTR AMPL: 8.7 mV
MDC IDC SESS DTM: 20190410100040
MDC IDC SET LEADCHNL RA PACING AMPLITUDE: 1.5 V
MDC IDC SET LEADCHNL RV SENSING SENSITIVITY: 2 mV
MDC IDC STAT BRADY RA PERCENT PACED: 12 %
MDC IDC STAT BRADY RV PERCENT PACED: 87 %
Pulse Gen Model: 2210
Pulse Gen Serial Number: 7393244

## 2017-08-06 ENCOUNTER — Other Ambulatory Visit: Payer: Self-pay | Admitting: Internal Medicine

## 2017-09-09 NOTE — Progress Notes (Signed)
Subjective:    Patient ID: [name], female    DOB: 1923/11/29, 84 y.o.   MRN: [id number]  HPI The patient is here for an acute visit.  Her son and daughter-in-law brought her.  ? UTI: Patient is demented and is not able to give any history.  She is no concerns or complaints.  Her daughter-in-law and son have noticed that her urine has an odor.  She also seems more agitated.  She does have a history of a urinary tract infection-February was her last infection and did end her up in the hospital.  They are also concerned about her personal hygiene.  Her and her husband live alone and he also has dementia.  They or her other son visits daily.  They would like to get a private home health aide in 3 times a week to help her basic, but her husband is very resistant.  She is incontinent of urine, but does not always wear depends because she does not think she needs them.  She does have accidents.  Last urinary tract infection 04/2017-Enterobacter aerogenes.  She was treated with keflex, later changed to fosfomycin after sensitivity report came back.        Medications and allergies reviewed with patient and updated if appropriate.  Patient Active Problem List   Diagnosis Date Noted  . Hyperglycemia 09/09/2016  . CKD (chronic kidney disease) stage 3, GFR 30-59 ml/min (HCC) 09/10/2015  . Arthritis of right hip, severe 09/10/2015  . Pacemaker-St.Jude 01/13/2012  . Second degree Mobitz II AV block 01/11/2012  . CAROTID STENOSIS 11/29/2008  . Hearing loss 10/12/2008  . Osteoporosis 05/08/2008  . AORTIC VALVE REPLACEMENT, HX OF 05/08/2008  . Hyperlipidemia 04/13/2007  . Mild dementia 04/13/2007  . Coronary atherosclerosis 04/13/2007  . Paroxysmal atrial fibrillation ([place]) 04/13/2007  . DIVERTICULOSIS OF COLON 04/13/2007  . Anxiety state 01/18/2007  . Essential hypertension 01/18/2007    Current Outpatient Medications on File Prior to Visit  Medication Sig Dispense Refill  .  acetaminophen (TYLENOL) 325 MG tablet Take 650 mg by mouth every 6 (six) hours as needed (pain).     Marland Kitchen alendronate (FOSAMAX) 70 MG tablet Take 1 tablet (70 mg total) by mouth every 7 (seven) days. Take with a full glass of water on an empty stomach. 4 tablet 11  . amLODipine (NORVASC) 5 MG tablet TAKE 1 TABLET BY MOUTH ONCE DAILY 90 tablet 1  . cholecalciferol (VITAMIN D) 1000 UNITS tablet Take 1,000 Units by mouth daily with breakfast.     . citalopram (CELEXA) 20 MG tablet TAKE 1 TABLET BY MOUTH ONCE DAILY AT 6 PM 90 tablet 0  . folic acid (FOLVITE) 1 MG tablet TAKE 1 TABLET BY MOUTH ONCE DAILY 90 tablet 1  . furosemide (LASIX) 40 MG tablet TAKE 2 TABLETS BY MOUTH ONCE DAILY 180 tablet 1  . pravastatin (PRAVACHOL) 40 MG tablet TAKE 1 TABLET BY MOUTH AT BEDTIME 90 tablet 1  . risperiDONE (RISPERDAL) 0.25 MG tablet TAKE 1 TABLET BY MOUTH AT BEDTIME 90 tablet 1  . betamethasone dipropionate 0.05 % lotion Apply 1 application topically 2 (two) times daily as needed (psoriasis).  60 mL 0   No current facility-administered medications on file prior to visit.     Past Medical History:  Diagnosis Date  . Anemia, unspecified   . Anxiety state, unspecified   . Atrial fibrillation (HCC)    not anticoagulation candidate due to falls  . CAD (coronary artery disease)  s/p CABG  . Carotid artery stenosis   . Complete heart block ([place]) 12/2011   s/p St.Jude dual chamber PPM 01/12/12  . Dementia   . Dementia 05/09/2017  . Diverticulosis of colon (without mention of hemorrhage)   . Esophageal reflux   . HTN (hypertension)   . Hyperlipidemia   . Osteoarthrosis, unspecified whether generalized or localized, unspecified site   . Osteoporosis, unspecified   . Pacemaker-St.Jude 01/13/2012  . S/P AVR (aortic valve replacement) 2003   bioprosthetic   . Thoracic aortic aneurysm ([place]) 2003   s/p repair  . Unspecified chronic bronchitis ([place])     Past Surgical History:  Procedure Laterality Date  .  AORTIC VALVE REPLACEMENT  2003  . CATARACT EXTRACTION     bilateral  . CORONARY ARTERY BYPASS GRAFT    . INTRAOCULAR LENS INSERTION     bilateral  . PACEMAKER INSERTION  01/12/12   SJM Accent DR RF implanted by Dr Rayann Heman for CHB  . PERMANENT PACEMAKER INSERTION N/A 01/12/2012   Procedure: PERMANENT PACEMAKER INSERTION;  Surgeon: Thompson Grayer, MD;  Location: [facility] CATH LAB;  Service: Cardiovascular;  Laterality: N/A;  . [place]   left  . VESICOVAGINAL FISTULA CLOSURE W/ TAH      Social History   Socioeconomic History  . Marital status: Married    Spouse name: [name]  . Number of children: Not on file  . Years of education: Not on file  . Highest education level: Not on file  Occupational History  . Occupation: retired  Scientific laboratory technician  . Financial resource strain: Not on file  . Food insecurity:    Worry: Not on file    Inability: Not on file  . Transportation needs:    Medical: Not on file    Non-medical: Not on file  Tobacco Use  . Smoking status: Never Smoker  . Smokeless tobacco: Never Used  Substance and Sexual Activity  . Alcohol use: No  . Drug use: No  . Sexual activity: Not on file  Lifestyle  . Physical activity:    Days per week: Not on file    Minutes per session: Not on file  . Stress: Not on file  Relationships  . Social connections:    Talks on phone: Not on file    Gets together: Not on file    Attends religious service: Not on file    Active member of club or organization: Not on file    Attends meetings of clubs or organizations: Not on file    Relationship status: Not on file  Other Topics Concern  . Not on file  Social History Narrative   Lives with husband. Mother had kidney problems.  Father had coronary artery  disease.  Both died when the patient was young.  She does not know how old  they were.  She has one brother who died at age 13 of a CVA, although, he  had coronary artery disease and diabetes.  Another brother died at  age 67 of  a stroke.  He also had coronary artery disease.  One sister is living, she  has coronary artery disease and diabetes and one sister has anginal symptoms. Her sister has a pacemaker (history palpitations).           Family History  Problem Relation Age of Onset  . Diabetes Brother   . Diabetes Sister     Review of Systems Patient has dementia-review of systems not reliable  She did deny any pain, urinary symptoms, fevers, chills, nausea, but again this is not reliable      Objective:   Vitals:   09/10/17 1308  BP: 102/72  Pulse: 74  Resp: 16  Temp: 98.5 F (36.9 C)  SpO2: 95%   BP Readings from Last 3 Encounters:  09/10/17 102/72  05/26/17 138/76  05/09/17 (!) 147/72   Wt Readings from Last 3 Encounters:  05/26/17 171 lb (77.6 kg)  05/09/17 171 lb (77.6 kg)  04/05/17 175 lb (79.4 kg)   There is no height or weight on file to calculate BMI.   Physical Exam  Constitutional: She appears well-developed and well-nourished. No distress.  HENT:  Head: Normocephalic and atraumatic.  Neck: Neck supple.  Cardiovascular: Normal rate and regular rhythm.  Pulmonary/Chest: Effort normal. No respiratory distress. She has no wheezes. She has no rales.  Abdominal: Soft. She exhibits no distension. There is no tenderness.  Musculoskeletal: She exhibits no edema.  Skin: Skin is warm and dry. She is not diaphoretic.  Psychiatric: She has a normal mood and affect. Her behavior is normal.           Assessment & Plan:    See Problem List for Assessment and Plan of chronic medical problems.

## 2017-09-10 ENCOUNTER — Ambulatory Visit (INDEPENDENT_AMBULATORY_CARE_PROVIDER_SITE_OTHER): Payer: Medicare Other | Admitting: Internal Medicine

## 2017-09-10 ENCOUNTER — Other Ambulatory Visit: Payer: Medicare Other

## 2017-09-10 ENCOUNTER — Telehealth: Payer: Self-pay | Admitting: Emergency Medicine

## 2017-09-10 ENCOUNTER — Encounter: Payer: Self-pay | Admitting: Internal Medicine

## 2017-09-10 VITALS — BP 102/72 | HR 74 | Temp 98.5°F | Resp 16

## 2017-09-10 DIAGNOSIS — I2510 Atherosclerotic heart disease of native coronary artery without angina pectoris: Secondary | ICD-10-CM | POA: Diagnosis not present

## 2017-09-10 DIAGNOSIS — N3000 Acute cystitis without hematuria: Secondary | ICD-10-CM

## 2017-09-10 LAB — POCT URINALYSIS DIPSTICK
Bilirubin, UA: NEGATIVE
Blood, UA: NEGATIVE
Glucose, UA: NEGATIVE
KETONES UA: NEGATIVE
LEUKOCYTES UA: NEGATIVE
NITRITE UA: NEGATIVE
PROTEIN UA: NEGATIVE
Spec Grav, UA: 1.02 (ref 1.010–1.025)
UROBILINOGEN UA: 0.2 U/dL
pH, UA: 6 (ref 5.0–8.0)

## 2017-09-10 MED ORDER — FOSFOMYCIN TROMETHAMINE 3 G PO PACK: 3.0000 g | PACK | Freq: Once | ORAL | 0 refills | Status: AC

## 2017-09-10 NOTE — Patient Instructions (Addendum)
She needs to be wearing depends both day and night due to urinary incontinence and high risk of urinary tract infections.    She needs to have help with bathing and personal hygiene a few times a week to help prevent infections.  This should be done by a home health aide.    We will treat for a probable UTI with Fosfomycin - this was sent to your pharmacy.     Urinary Tract Infection, Adult A urinary tract infection (UTI) is an infection of any part of the urinary tract, which includes the kidneys, ureters, bladder, and urethra. These organs make, store, and get rid of urine in the body. UTI can be a bladder infection (cystitis) or kidney infection (pyelonephritis). What are the causes? This infection may be caused by fungi, viruses, or bacteria. Bacteria are the most common cause of UTIs. This condition can also be caused by repeated incomplete emptying of the bladder during urination. What increases the risk? This condition is more likely to develop if:  You ignore your need to urinate or hold urine for long periods of time.  You do not empty your bladder completely during urination.  You wipe back to front after urinating or having a bowel movement, if you are female.  You are uncircumcised, if you are female.  You are constipated.  You have a urinary catheter that stays in place (indwelling).  You have a weak defense (immune) system.  You have a medical condition that affects your bowels, kidneys, or bladder.  You have diabetes.  You take antibiotic medicines frequently or for long periods of time, and the antibiotics no longer work well against certain types of infections (antibiotic resistance).  You take medicines that irritate your urinary tract.  You are exposed to chemicals that irritate your urinary tract.  You are female.  What are the signs or symptoms? Symptoms of this condition include:  Fever.  Frequent urination or passing small amounts of urine  frequently.  Needing to urinate urgently.  Pain or burning with urination.  Urine that smells bad or unusual.  Cloudy urine.  Pain in the lower abdomen or back.  Trouble urinating.  Blood in the urine.  Vomiting or being less hungry than normal.  Diarrhea or abdominal pain.  Vaginal discharge, if you are female.  How is this diagnosed? This condition is diagnosed with a medical history and physical exam. You will also need to provide a urine sample to test your urine. Other tests may be done, including:  Blood tests.  Sexually transmitted disease (STD) testing.  If you have had more than one UTI, a cystoscopy or imaging studies may be done to determine the cause of the infections. How is this treated? Treatment for this condition often includes a combination of two or more of the following:  Antibiotic medicine.  Other medicines to treat less common causes of UTI.  Over-the-counter medicines to treat pain.  Drinking enough water to stay hydrated.  Follow these instructions at home:  Take over-the-counter and prescription medicines only as told by your health care provider.  If you were prescribed an antibiotic, take it as told by your health care provider. Do not stop taking the antibiotic even if you start to feel better.  Avoid alcohol, caffeine, tea, and carbonated beverages. They can irritate your bladder.  Drink enough fluid to keep your urine clear or pale yellow.  Keep all follow-up visits as told by your health care provider. This is important.  Make sure to: ? Empty your bladder often and completely. Do not hold urine for long periods of time. ? Empty your bladder before and after sex. ? Wipe from front to back after a bowel movement if you are female. Use each tissue one time when you wipe. Contact a health care provider if:  You have back pain.  You have a fever.  You feel nauseous or vomit.  Your symptoms do not get better after 3  days.  Your symptoms go away and then return. Get help right away if:  You have severe back pain or lower abdominal pain.  You are vomiting and cannot keep down any medicines or water. This information is not intended to replace advice given to you by your health care provider. Make sure you discuss any questions you have with your health care provider. Document Released: 12/17/2004 Document Revised: 08/21/2015 Document Reviewed: 01/28/2015 Elsevier Interactive Patient Education  [employer].

## 2017-09-10 NOTE — Telephone Encounter (Signed)
PA completed over the phone. Ref no. [id number]

## 2017-09-11 DIAGNOSIS — N3000 Acute cystitis without hematuria: Secondary | ICD-10-CM | POA: Insufficient documentation

## 2017-09-11 LAB — URINE CULTURE
MICRO NUMBER: 90745339
SPECIMEN QUALITY:: ADEQUATE

## 2017-09-11 NOTE — Assessment & Plan Note (Signed)
She will see if she can give [place] a urine sample, but I am reluctant to wait for the culture because of her hospitalization earlier this year because of UTI and her being an unreliable historian Agree with her family members that at home health aide to help with hygiene and bathing is necessary Will check urine dip and sent for culture, but will empirically treat for possible UTI given abnormal urine smell and increased agitation

## 2017-09-13 NOTE — Telephone Encounter (Signed)
PA denied. Per Urine Culture antibiotic is not needed.

## 2017-09-27 ENCOUNTER — Other Ambulatory Visit: Payer: Self-pay | Admitting: Internal Medicine

## 2017-09-28 ENCOUNTER — Ambulatory Visit (INDEPENDENT_AMBULATORY_CARE_PROVIDER_SITE_OTHER): Payer: Medicare Other | Admitting: *Deleted

## 2017-09-28 ENCOUNTER — Encounter: Payer: Self-pay | Admitting: Cardiology

## 2017-09-28 ENCOUNTER — Telehealth: Payer: Self-pay | Admitting: Cardiology

## 2017-09-28 DIAGNOSIS — I441 Atrioventricular block, second degree: Secondary | ICD-10-CM

## 2017-09-28 NOTE — Telephone Encounter (Signed)
Confirmed remote transmission w/ pt husband.   

## 2017-09-29 NOTE — Progress Notes (Signed)
Remote pacemaker transmission.   

## 2017-10-02 ENCOUNTER — Other Ambulatory Visit: Payer: Self-pay | Admitting: Internal Medicine

## 2017-10-04 ENCOUNTER — Ambulatory Visit: Payer: Medicare Other

## 2017-10-04 ENCOUNTER — Ambulatory Visit: Payer: Medicare Other | Admitting: Internal Medicine

## 2017-10-23 LAB — CUP PACEART REMOTE DEVICE CHECK
Battery Remaining Percentage: 65 %
Battery Voltage: 2.9 V
Brady Statistic AP VP Percent: 7.4 %
Brady Statistic RA Percent Paced: 10 %
Brady Statistic RV Percent Paced: 63 %
Date Time Interrogation Session: 20190709200225
Implantable Lead Implant Date: 20131022
Implantable Lead Location: 753859
Implantable Lead Location: 753860
Implantable Lead Model: 1944
Implantable Lead Model: 1948
Implantable Pulse Generator Implant Date: 20131022
Lead Channel Impedance Value: 460 Ohm
Lead Channel Impedance Value: 630 Ohm
Lead Channel Pacing Threshold Amplitude: 0.875 V
Lead Channel Pacing Threshold Pulse Width: 0.4 ms
Lead Channel Sensing Intrinsic Amplitude: 5 mV
Lead Channel Setting Pacing Amplitude: 1.375
Lead Channel Setting Pacing Pulse Width: 0.4 ms
MDC IDC LEAD IMPLANT DT: 20131022
MDC IDC MSMT BATTERY REMAINING LONGEVITY: 88 mo
MDC IDC MSMT LEADCHNL RA PACING THRESHOLD AMPLITUDE: 0.375 V
MDC IDC MSMT LEADCHNL RV PACING THRESHOLD PULSEWIDTH: 0.4 ms
MDC IDC MSMT LEADCHNL RV SENSING INTR AMPL: 7 mV
MDC IDC SET LEADCHNL RV PACING AMPLITUDE: 1.125
MDC IDC SET LEADCHNL RV SENSING SENSITIVITY: 2 mV
MDC IDC STAT BRADY AP VS PERCENT: 3.7 %
MDC IDC STAT BRADY AS VP PERCENT: 56 %
MDC IDC STAT BRADY AS VS PERCENT: 32 %
Pulse Gen Model: 2210
Pulse Gen Serial Number: 7393244

## 2017-10-24 ENCOUNTER — Other Ambulatory Visit: Payer: Self-pay | Admitting: Internal Medicine

## 2017-11-04 ENCOUNTER — Encounter: Payer: Self-pay | Admitting: Nurse Practitioner

## 2017-11-13 ENCOUNTER — Other Ambulatory Visit: Payer: Self-pay | Admitting: Internal Medicine

## 2017-11-17 ENCOUNTER — Other Ambulatory Visit: Payer: Self-pay | Admitting: Internal Medicine

## 2017-11-25 ENCOUNTER — Telehealth: Payer: Self-pay | Admitting: Internal Medicine

## 2017-11-25 MED ORDER — AMLODIPINE BESYLATE 5 MG PO TABS: 5.0000 mg | ORAL_TABLET | Freq: Every day | ORAL | 1 refills | Status: DC

## 2017-11-25 NOTE — Telephone Encounter (Signed)
Rx sent. Pt is aware.  °

## 2017-11-25 NOTE — Telephone Encounter (Signed)
Copied from [place] [id number]. Topic: Quick Communication - Rx Refill/Question >> Nov 25, 2017 12:00 PM Oliver Pila B wrote: Medication: amLODipine (NORVASC) 5 MG tablet [[id number]]   Pharmacy will not fill w/out pcp approval; pt has 2 weeks left of medication  Has the patient contacted their pharmacy? Yes.   (Agent: If no, request that the patient contact the pharmacy for the refill.) (Agent: If yes, when and what did the pharmacy advise?)  Preferred Pharmacy (with phone number or street name): [facility]  Agent: Please be advised that RX refills may take up to 3 business days. We ask that you follow-up with your pharmacy.

## 2017-11-26 ENCOUNTER — Ambulatory Visit: Payer: Medicare Other | Admitting: Internal Medicine

## 2017-11-29 NOTE — Progress Notes (Signed)
Subjective:    Patient ID: [name], female    DOB: March 09, 1924, 84 y.o.   MRN: [id number]  HPI The patient is here for follow up.  She is here with her son today.  Her husband is currently in the hospital after falling.  She has dementia and is unable to give her own history.  Overall she feels she is doing fine.  Her son has no major concerns-just a couple of skin issues.  Overall he feels she is doing well and is stable.  Prediabetes:  She is compliant with a low sugar/carbohydrate diet.  She does not exercising regularly.  Coronary artery disease, hyperlipidemia, atrial fibrillation,Hypertension: She is taking her medication daily. She is compliant with a low sodium diet.   She is not exercising regularly.      Chronic kidney disease: Her family does not give her any NSAIDs.  Anxiety: She is taking the citalopram daily.  She uses Ativan on a rare occasion.. She denies any side effects from the medication. She feels her anxiety is well controlled and she is happy with her current dose of medication.   Mild dementia with agitation: She takes Risperdal nightly.  Her sleep is good.  There are no major changes with her behavior.  Osteoporosis: She is taking Fosamax once weekly.  She is not exercising regularly.  She does take vitamin D daily.  Rash on back: The rashes been there for a few weeks.  Initially it looked like little blisters, but now it looks like scabs.  She does complain about itching.  Is on the posterior left upper back and goes down the arm slightly.  Anxiety: She is taking her medication daily as prescribed. She denies any side effects from the medication. She feels her anxiety is well controlled and she is happy with her current dose of medication.    Place on nose: it has been present for 4-6 months.  It is not healed and looks like a scabbed ulcer.  She denies any pain.   She is eating well and sleeping well.   She is getting bathed about 2/week but it is a  struggle.     Medications and allergies reviewed with patient and updated if appropriate.  Patient Active Problem List   Diagnosis Date Noted  . Acute cystitis without hematuria 09/11/2017  . Prediabetes 09/09/2016  . CKD (chronic kidney disease) stage 3, GFR 30-59 ml/min (HCC) 09/10/2015  . Arthritis of right hip, severe 09/10/2015  . Pacemaker-St.Jude 01/13/2012  . Second degree Mobitz II AV block 01/11/2012  . CAROTID STENOSIS 11/29/2008  . Hearing loss 10/12/2008  . Osteoporosis 05/08/2008  . AORTIC VALVE REPLACEMENT, HX OF 05/08/2008  . Hyperlipidemia 04/13/2007  . Mild dementia 04/13/2007  . Coronary atherosclerosis 04/13/2007  . Paroxysmal atrial fibrillation ([place]) 04/13/2007  . DIVERTICULOSIS OF COLON 04/13/2007  . Anxiety state 01/18/2007  . Essential hypertension 01/18/2007    Current Outpatient Medications on File Prior to Visit  Medication Sig Dispense Refill  . acetaminophen (TYLENOL) 325 MG tablet Take 650 mg by mouth every 6 (six) hours as needed (pain).     Marland Kitchen alendronate (FOSAMAX) 70 MG tablet Take 1 tablet (70 mg total) by mouth every 7 (seven) days. Take with a full glass of water on an empty stomach. 4 tablet 11  . amLODipine (NORVASC) 5 MG tablet Take 1 tablet (5 mg total) by mouth daily. 90 tablet 1  . betamethasone dipropionate 0.05 % lotion Apply 1 application  topically 2 (two) times daily as needed (psoriasis).  60 mL 0  . cholecalciferol (VITAMIN D) 1000 UNITS tablet Take 1,000 Units by mouth daily with breakfast.     . citalopram (CELEXA) 20 MG tablet TAKE 1 TABLET BY MOUTH ONCE DAILY AT  6PM 90 tablet 1  . folic acid (FOLVITE) 1 MG tablet TAKE 1 TABLET BY MOUTH ONCE DAILY 90 tablet 1  . furosemide (LASIX) 40 MG tablet TAKE 2 TABLETS BY MOUTH ONCE DAILY 180 tablet 1  . pravastatin (PRAVACHOL) 40 MG tablet TAKE 1 TABLET BY MOUTH AT BEDTIME 90 tablet 0  . risperiDONE (RISPERDAL) 0.25 MG tablet TAKE 1 TABLET BY MOUTH AT BEDTIME 90 tablet 1   No current  facility-administered medications on file prior to visit.     Past Medical History:  Diagnosis Date  . Anemia, unspecified   . Anxiety state, unspecified   . Atrial fibrillation (HCC)    not anticoagulation candidate due to falls  . CAD (coronary artery disease)    s/p CABG  . Carotid artery stenosis   . Complete heart block ([place]) 12/2011   s/p St.Jude dual chamber PPM 01/12/12  . Dementia   . Dementia 05/09/2017  . Diverticulosis of colon (without mention of hemorrhage)   . Esophageal reflux   . HTN (hypertension)   . Hyperlipidemia   . Osteoarthrosis, unspecified whether generalized or localized, unspecified site   . Osteoporosis, unspecified   . Pacemaker-St.Jude 01/13/2012  . S/P AVR (aortic valve replacement) 2003   bioprosthetic   . Thoracic aortic aneurysm ([place]) 2003   s/p repair  . Unspecified chronic bronchitis ([place])     Past Surgical History:  Procedure Laterality Date  . AORTIC VALVE REPLACEMENT  2003  . CATARACT EXTRACTION     bilateral  . CORONARY ARTERY BYPASS GRAFT    . INTRAOCULAR LENS INSERTION     bilateral  . PACEMAKER INSERTION  01/12/12   SJM Accent DR RF implanted by Dr Rayann Heman for CHB  . PERMANENT PACEMAKER INSERTION N/A 01/12/2012   Procedure: PERMANENT PACEMAKER INSERTION;  Surgeon: Thompson Grayer, MD;  Location: [facility] CATH LAB;  Service: Cardiovascular;  Laterality: N/A;  . [place]   left  . VESICOVAGINAL FISTULA CLOSURE W/ TAH      Social History   Socioeconomic History  . Marital status: Married    Spouse name: [name]  . Number of children: Not on file  . Years of education: Not on file  . Highest education level: Not on file  Occupational History  . Occupation: retired  Scientific laboratory technician  . Financial resource strain: Not on file  . Food insecurity:    Worry: Not on file    Inability: Not on file  . Transportation needs:    Medical: Not on file    Non-medical: Not on file  Tobacco Use  . Smoking status: Never Smoker   . Smokeless tobacco: Never Used  Substance and Sexual Activity  . Alcohol use: No  . Drug use: No  . Sexual activity: Not on file  Lifestyle  . Physical activity:    Days per week: Not on file    Minutes per session: Not on file  . Stress: Not on file  Relationships  . Social connections:    Talks on phone: Not on file    Gets together: Not on file    Attends religious service: Not on file    Active member of club or organization: Not on  file    Attends meetings of clubs or organizations: Not on file    Relationship status: Not on file  Other Topics Concern  . Not on file  Social History Narrative   Lives with husband. Mother had kidney problems.  Father had coronary artery  disease.  Both died when the patient was young.  She does not know how old  they were.  She has one brother who died at age 41 of a CVA, although, he  had coronary artery disease and diabetes.  Another brother died at age 16 of  a stroke.  He also had coronary artery disease.  One sister is living, she  has coronary artery disease and diabetes and one sister has anginal symptoms. Her sister has a pacemaker (history palpitations).           Family History  Problem Relation Age of Onset  . Diabetes Brother   . Diabetes Sister     Review of Systems   Unable to obtain from patient due to dementia      Objective:    Vitals:   11/30/17 1331  BP: 134/62  Pulse: 70  Resp: 16  Temp: (!) 97.5 F (36.4 C)  SpO2: 97%   BP Readings from Last 3 Encounters:  11/30/17 134/62  09/10/17 102/72  05/26/17 138/76   Wt Readings from Last 3 Encounters:  11/30/17 177 lb (80.3 kg)  05/26/17 171 lb (77.6 kg)  05/09/17 171 lb (77.6 kg)   Body mass index is 32.37 kg/m.   Physical Exam    Constitutional: Appears well-developed and well-nourished. No distress.  HENT:  Head: Normocephalic and atraumatic.  Neck: Neck supple. No tracheal deviation present. No thyromegaly present.  No cervical  lymphadenopathy Cardiovascular: Normal rate, regular rhythm and normal heart sounds.   3/6 systolic murmur heard. No carotid bruit .  Trace bilateral lower extremity edema Pulmonary/Chest: Effort normal and breath sounds normal. No respiratory distress. No has no wheezes. No rales. Abdomen: Soft, nontender, nondistended Skin: Skin is warm and dry. Not diaphoretic.  Scabbed rash posterior left upper back with scattered marks down posterior arm-no open wounds or drainage, no generalized erythema, no active blisters.  Scabbed ulcer left side of nose-no open wound or drainage Psychiatric: Normal mood and affect. Behavior is normal.      Assessment & Plan:    See Problem List for Assessment and Plan of chronic medical problems.

## 2017-11-29 NOTE — Patient Instructions (Addendum)
  Test(s) ordered today. Your results will be released to MyChart (or called to you) after review, usually within 72hours after test completion. If any changes need to be made, you will be notified at that same time.  Flu immunization administered today.    Medications reviewed and updated.  No changes recommended at this time.    Please followup in 6months   

## 2017-11-30 ENCOUNTER — Ambulatory Visit (INDEPENDENT_AMBULATORY_CARE_PROVIDER_SITE_OTHER): Payer: Medicare Other | Admitting: Internal Medicine

## 2017-11-30 ENCOUNTER — Other Ambulatory Visit (INDEPENDENT_AMBULATORY_CARE_PROVIDER_SITE_OTHER): Payer: Medicare Other

## 2017-11-30 ENCOUNTER — Encounter: Payer: Self-pay | Admitting: Internal Medicine

## 2017-11-30 VITALS — BP 134/62 | HR 70 | Temp 97.5°F | Resp 16 | Ht 62.0 in | Wt 177.0 lb

## 2017-11-30 DIAGNOSIS — F0390 Unspecified dementia without behavioral disturbance: Secondary | ICD-10-CM

## 2017-11-30 DIAGNOSIS — E7849 Other hyperlipidemia: Secondary | ICD-10-CM | POA: Diagnosis not present

## 2017-11-30 DIAGNOSIS — N183 Chronic kidney disease, stage 3 unspecified: Secondary | ICD-10-CM

## 2017-11-30 DIAGNOSIS — Z23 Encounter for immunization: Secondary | ICD-10-CM

## 2017-11-30 DIAGNOSIS — L989 Disorder of the skin and subcutaneous tissue, unspecified: Secondary | ICD-10-CM | POA: Diagnosis not present

## 2017-11-30 DIAGNOSIS — I10 Essential (primary) hypertension: Secondary | ICD-10-CM

## 2017-11-30 DIAGNOSIS — R7303 Prediabetes: Secondary | ICD-10-CM

## 2017-11-30 DIAGNOSIS — I2510 Atherosclerotic heart disease of native coronary artery without angina pectoris: Secondary | ICD-10-CM

## 2017-11-30 DIAGNOSIS — I480 Paroxysmal atrial fibrillation: Secondary | ICD-10-CM

## 2017-11-30 DIAGNOSIS — M810 Age-related osteoporosis without current pathological fracture: Secondary | ICD-10-CM | POA: Diagnosis not present

## 2017-11-30 DIAGNOSIS — F411 Generalized anxiety disorder: Secondary | ICD-10-CM

## 2017-11-30 DIAGNOSIS — R21 Rash and other nonspecific skin eruption: Secondary | ICD-10-CM | POA: Diagnosis not present

## 2017-11-30 DIAGNOSIS — F03A0 Unspecified dementia, mild, without behavioral disturbance, psychotic disturbance, mood disturbance, and anxiety: Secondary | ICD-10-CM

## 2017-11-30 LAB — TSH: TSH: 1.3 u[IU]/mL (ref 0.35–4.50)

## 2017-11-30 LAB — LIPID PANEL
CHOL/HDL RATIO: 3
Cholesterol: 151 mg/dL (ref 0–200)
HDL: 45.9 mg/dL (ref >39.00)
NONHDL: 104.94
Triglycerides: 253 mg/dL — ABNORMAL HIGH (ref 0.0–149.0)
VLDL: 50.6 mg/dL — AB (ref 0.0–40.0)

## 2017-11-30 LAB — CBC WITH DIFFERENTIAL/PLATELET
BASOS ABS: 0 10*3/uL (ref 0.0–0.1)
Basophils Relative: 0.6 % (ref 0.0–3.0)
EOS ABS: 0.2 10*3/uL (ref 0.0–0.7)
Eosinophils Relative: 2.7 % (ref 0.0–5.0)
HEMATOCRIT: 36.3 % (ref 36.0–46.0)
Hemoglobin: 12.2 g/dL (ref 12.0–15.0)
LYMPHS ABS: 1 10*3/uL (ref 0.7–4.0)
LYMPHS PCT: 17.4 % (ref 12.0–46.0)
MCHC: 33.6 g/dL (ref 30.0–36.0)
MCV: 87.6 fl (ref 78.0–100.0)
MONO ABS: 0.6 10*3/uL (ref 0.1–1.0)
Monocytes Relative: 11 % (ref 3.0–12.0)
NEUTROS ABS: 3.9 10*3/uL (ref 1.4–7.7)
NEUTROS PCT: 68.3 % (ref 43.0–77.0)
Platelets: 227 10*3/uL (ref 150.0–400.0)
RBC: 4.15 Mil/uL (ref 3.87–5.11)
RDW: 16.4 % — AB (ref 11.5–15.5)
WBC: 5.8 10*3/uL (ref 4.0–10.5)

## 2017-11-30 LAB — COMPREHENSIVE METABOLIC PANEL
ALT: 8 U/L (ref 0–35)
AST: 8 U/L (ref 0–37)
Albumin: 3.9 g/dL (ref 3.5–5.2)
Alkaline Phosphatase: 94 U/L (ref 39–117)
BILIRUBIN TOTAL: 0.4 mg/dL (ref 0.2–1.2)
BUN: 22 mg/dL (ref 6–23)
CHLORIDE: 99 meq/L (ref 96–112)
CO2: 30 mEq/L (ref 19–32)
CREATININE: 1.36 mg/dL — AB (ref 0.40–1.20)
Calcium: 9.1 mg/dL (ref 8.4–10.5)
GFR: 38.65 mL/min — ABNORMAL LOW (ref >60.00)
GLUCOSE: 116 mg/dL — AB (ref 70–99)
Potassium: 3.6 mEq/L (ref 3.5–5.1)
SODIUM: 137 meq/L (ref 135–145)
Total Protein: 7 g/dL (ref 6.0–8.3)

## 2017-11-30 LAB — LDL CHOLESTEROL, DIRECT: LDL DIRECT: 79 mg/dL

## 2017-11-30 LAB — HEMOGLOBIN A1C: Hgb A1c MFr Bld: 5.8 % (ref 4.6–6.5)

## 2017-11-30 NOTE — Assessment & Plan Note (Signed)
Blood pressure well controlled Continue current medications at current doses CMP, CBC

## 2017-11-30 NOTE — Assessment & Plan Note (Signed)
In sinus rhythm Not on anticoagulation Check CBC, CMP, TSH

## 2017-11-30 NOTE — Assessment & Plan Note (Signed)
CMP

## 2017-11-30 NOTE — Assessment & Plan Note (Signed)
Continue pravastatin Check lipid panel, CMP 

## 2017-11-30 NOTE — Assessment & Plan Note (Signed)
Likely shingles Rash is scabbed over so no treatment is needed She does have some itch and they are applying topical creams, which seems to be working Continue topical treatment

## 2017-11-30 NOTE — Assessment & Plan Note (Signed)
No concerning symptoms Continue current medications 

## 2017-11-30 NOTE — Assessment & Plan Note (Signed)
Dementia stable per family She is eating well and sleeping well Agitation controlled with some Risperdal at night Continue Risperdal

## 2017-11-30 NOTE — Assessment & Plan Note (Signed)
Taking Fosamax weekly Taking vitamin D Was not able to have a bone density done so we decided to treat empirically Continue Fosamax and vitamin D

## 2017-11-30 NOTE — Assessment & Plan Note (Signed)
Does not seem to be anxious Family feels her mood is good Continue citalopram daily

## 2017-11-30 NOTE — Assessment & Plan Note (Signed)
Check A1c. 

## 2017-12-14 DIAGNOSIS — B029 Zoster without complications: Secondary | ICD-10-CM | POA: Diagnosis not present

## 2017-12-14 DIAGNOSIS — C44311 Basal cell carcinoma of skin of nose: Secondary | ICD-10-CM | POA: Diagnosis not present

## 2017-12-15 ENCOUNTER — Encounter: Payer: Medicare Other | Admitting: Nurse Practitioner

## 2017-12-28 ENCOUNTER — Ambulatory Visit (INDEPENDENT_AMBULATORY_CARE_PROVIDER_SITE_OTHER): Payer: Medicare Other | Admitting: *Deleted

## 2017-12-28 ENCOUNTER — Telehealth: Payer: Self-pay | Admitting: Cardiology

## 2017-12-28 DIAGNOSIS — I480 Paroxysmal atrial fibrillation: Secondary | ICD-10-CM

## 2017-12-28 DIAGNOSIS — I441 Atrioventricular block, second degree: Secondary | ICD-10-CM

## 2017-12-28 NOTE — Telephone Encounter (Signed)
Confirmed remote transmission w/ pt son.    

## 2017-12-29 NOTE — Progress Notes (Signed)
Remote pacemaker transmission.   

## 2018-01-05 ENCOUNTER — Encounter: Payer: Self-pay | Admitting: Cardiology

## 2018-01-06 ENCOUNTER — Other Ambulatory Visit: Payer: Self-pay | Admitting: Internal Medicine

## 2018-01-12 ENCOUNTER — Encounter: Payer: Medicare Other | Admitting: Nurse Practitioner

## 2018-01-12 NOTE — Progress Notes (Signed)
Electrophysiology Office Note Date: 01/13/2018  ID:  [name], [name] 1923-08-28, MRN [id number]  PCP: [name] Rail, MD Primary Cardiologist: [name] Electrophysiologist: [name]  CC: Pacemaker follow-up  [name] is a 84 y.o. female seen today for Dr [name].  She presents today for routine electrophysiology followup.  Since last being seen in our clinic, the patient reports doing relatively well.  She finds that she is fatigued and short of breath with exertion more easily.   She denies chest pain, palpitations, PND, orthopnea, nausea, vomiting, dizziness, syncope, edema, weight gain, or early satiety.  Device History: STJ dual chamber PPM implanted 2013 for complete heart block   Past Medical History:  Diagnosis Date  . Anemia, unspecified   . Anxiety state, unspecified   . Atrial fibrillation (HCC)    not anticoagulation candidate due to falls  . CAD (coronary artery disease)    s/p CABG  . Carotid artery stenosis   . Complete heart block ([place]) 12/2011   s/p St.Jude dual chamber PPM 01/12/12  . Dementia ([place])   . Dementia ([place]) 05/09/2017  . Diverticulosis of colon (without mention of hemorrhage)   . Esophageal reflux   . HTN (hypertension)   . Hyperlipidemia   . Osteoarthrosis, unspecified whether generalized or localized, unspecified site   . Osteoporosis, unspecified   . Pacemaker-St.Jude 01/13/2012  . S/P AVR (aortic valve replacement) 2003   bioprosthetic   . Thoracic aortic aneurysm ([place]) 2003   s/p repair  . Unspecified chronic bronchitis ([place])    Past Surgical History:  Procedure Laterality Date  . AORTIC VALVE REPLACEMENT  2003  . CATARACT EXTRACTION     bilateral  . CORONARY ARTERY BYPASS GRAFT    . INTRAOCULAR LENS INSERTION     bilateral  . PACEMAKER INSERTION  01/12/12   SJM Accent DR RF implanted by Dr [name] for CHB  . PERMANENT PACEMAKER INSERTION N/A 01/12/2012   Procedure: PERMANENT PACEMAKER INSERTION;  Surgeon: [name] Grayer, MD;   Location: [facility] CATH LAB;  Service: Cardiovascular;  Laterality: N/A;  . [place]   left  . VESICOVAGINAL FISTULA CLOSURE W/ TAH      Current Outpatient Medications  Medication Sig Dispense Refill  . acetaminophen (TYLENOL) 325 MG tablet Take 650 mg by mouth every 6 (six) hours as needed (pain).     Marland Kitchen alendronate (FOSAMAX) 70 MG tablet Take 1 tablet (70 mg total) by mouth every 7 (seven) days. Take with a full glass of water on an empty stomach. 4 tablet 11  . amLODipine (NORVASC) 5 MG tablet Take 1 tablet (5 mg total) by mouth daily. 90 tablet 1  . cholecalciferol (VITAMIN D) 1000 UNITS tablet Take 1,000 Units by mouth daily with breakfast.     . citalopram (CELEXA) 20 MG tablet TAKE 1 TABLET BY MOUTH ONCE DAILY AT  6PM 90 tablet 1  . folic acid (FOLVITE) 1 MG tablet TAKE 1 TABLET BY MOUTH ONCE DAILY 90 tablet 1  . furosemide (LASIX) 40 MG tablet TAKE 2 TABLETS BY MOUTH ONCE DAILY 180 tablet 1  . Lidocaine HCl (LIDOCAINE PLUS) 4 % CREA Apply 1 application topically 3 (three) times daily.    . pravastatin (PRAVACHOL) 40 MG tablet TAKE 1 TABLET BY MOUTH AT BEDTIME 90 tablet 1  . risperiDONE (RISPERDAL) 0.25 MG tablet TAKE 1 TABLET BY MOUTH AT BEDTIME 90 tablet 1   No current facility-administered medications for this visit.     Allergies:  Coumadin [warfarin sodium]; Clarithromycin; Iohexol; Nsaids; Penicillins; Prednisone; Statins; and Sulfonamide derivatives   Social History: Social History   Socioeconomic History  . Marital status: Married    Spouse name: [name]  . Number of children: Not on file  . Years of education: Not on file  . Highest education level: Not on file  Occupational History  . Occupation: retired  Scientific laboratory technician  . Financial resource strain: Not on file  . Food insecurity:    Worry: Not on file    Inability: Not on file  . Transportation needs:    Medical: Not on file    Non-medical: Not on file  Tobacco Use  . Smoking status: Never Smoker   . Smokeless tobacco: Never Used  Substance and Sexual Activity  . Alcohol use: No  . Drug use: No  . Sexual activity: Not on file  Lifestyle  . Physical activity:    Days per week: Not on file    Minutes per session: Not on file  . Stress: Not on file  Relationships  . Social connections:    Talks on phone: Not on file    Gets together: Not on file    Attends religious service: Not on file    Active member of club or organization: Not on file    Attends meetings of clubs or organizations: Not on file    Relationship status: Not on file  . Intimate partner violence:    Fear of current or ex partner: Not on file    Emotionally abused: Not on file    Physically abused: Not on file    Forced sexual activity: Not on file  Other Topics Concern  . Not on file  Social History Narrative   Lives with husband. Mother had kidney problems.  Father had coronary artery  disease.  Both died when the patient was young.  She does not know how old  they were.  She has one brother who died at age 42 of a CVA, although, he  had coronary artery disease and diabetes.  Another brother died at age 67 of  a stroke.  He also had coronary artery disease.  One sister is living, she  has coronary artery disease and diabetes and one sister has anginal symptoms. Her sister has a pacemaker (history palpitations).           Family History: Family History  Problem Relation Age of Onset  . Diabetes Brother   . Diabetes Sister      Review of Systems: All other systems reviewed and are otherwise negative except as noted above.   Physical Exam: VS:  BP 120/80   Pulse 69   Ht 5\' 2"  (1.575 m)   Wt 175 lb 3.2 oz (79.5 kg)   SpO2 97%   BMI 32.04 kg/m  , BMI Body mass index is 32.04 kg/m.  GEN- The patient is elderly appearing, alert and oriented x 3 today.   HEENT: normocephalic, atraumatic; sclera clear, conjunctiva pink; hearing intact; oropharynx clear; neck supple  Lungs- Clear to ausculation  bilaterally, normal work of breathing.  No wheezes, rales, rhonchi Heart- Regular rate and rhythm GI- soft, non-tender, non-distended, bowel sounds present  Extremities- no clubbing, cyanosis, or edema  MS- no significant deformity or atrophy Skin- warm and dry, no rash or lesion; PPM pocket well healed Psych- euthymic mood, full affect Neuro- strength and sensation are intact  PPM Interrogation- reviewed in detail today,  See PACEART report  EKG:  EKG is not ordered today.  Recent Labs: 11/30/2017: ALT 8; BUN 22; Creatinine, Ser 1.36; Hemoglobin 12.2; Platelets 227.0; Potassium 3.6; Sodium 137; TSH 1.30   Wt Readings from Last 3 Encounters:  01/13/18 175 lb 3.2 oz (79.5 kg)  11/30/17 177 lb (80.3 kg)  05/26/17 171 lb (77.6 kg)     Other studies Reviewed: Additional studies/ records that were reviewed today include: Dr [name] and Dr Jackalyn Lombard office notes  Assessment and Plan:  1.  Complete heart block Normal PPM function See Pace Art report No changes today It is becoming increasingly difficult to get Ms Detter to appointments.  Will make recall for 1 year.  If unable to come at that time, will continue remote monitoring only.   2.  HTN Stable No change required today  3.  CAD No recent ischemic symptoms Continue current therapy  4.  Post AVR Followed by Dr [name]   5.  Paroxysmal atrial fibrillation Burden by device interrogation <1% CHADS2VASC is 5 Per Dr Jackalyn Lombard notes, not a candidate for anticoagulation    Current medicines are reviewed at length with the patient today.   The patient does not have concerns regarding her medicines.  The following changes were made today:  none  Labs/ tests ordered today include: none Orders Placed This Encounter  Procedures  . CUP PACEART INCLINIC DEVICE CHECK     Disposition:   Follow up with Delilah Shan, Dr [name] as scheduled, me in 1 year      Signed, Chanetta Marshall, NP 01/13/2018 6:12 PM  [facility]  [address] [place] [address] [phone] (office) [phone] (fax)

## 2018-01-13 ENCOUNTER — Encounter: Payer: Self-pay | Admitting: Nurse Practitioner

## 2018-01-13 ENCOUNTER — Ambulatory Visit (INDEPENDENT_AMBULATORY_CARE_PROVIDER_SITE_OTHER): Payer: Medicare Other | Admitting: Nurse Practitioner

## 2018-01-13 VITALS — BP 120/80 | HR 69 | Ht 62.0 in | Wt 175.2 lb

## 2018-01-13 DIAGNOSIS — I480 Paroxysmal atrial fibrillation: Secondary | ICD-10-CM | POA: Diagnosis not present

## 2018-01-13 DIAGNOSIS — I442 Atrioventricular block, complete: Secondary | ICD-10-CM | POA: Diagnosis not present

## 2018-01-13 DIAGNOSIS — Z952 Presence of prosthetic heart valve: Secondary | ICD-10-CM | POA: Diagnosis not present

## 2018-01-13 DIAGNOSIS — I10 Essential (primary) hypertension: Secondary | ICD-10-CM

## 2018-01-13 DIAGNOSIS — I2510 Atherosclerotic heart disease of native coronary artery without angina pectoris: Secondary | ICD-10-CM | POA: Diagnosis not present

## 2018-01-13 LAB — CUP PACEART INCLINIC DEVICE CHECK
Implantable Lead Implant Date: 20131022
Implantable Lead Location: 753859
Implantable Lead Model: 1944
Implantable Lead Model: 1948
Implantable Pulse Generator Implant Date: 20131022
MDC IDC LEAD IMPLANT DT: 20131022
MDC IDC LEAD LOCATION: 753860
MDC IDC SESS DTM: 20191024121144
Pulse Gen Model: 2210
Pulse Gen Serial Number: 7393244

## 2018-01-13 NOTE — Patient Instructions (Addendum)
Medication Instructions:  none If you need a refill on your cardiac medications before your next appointment, please call your pharmacy.     At [facility], you and your health needs are our priority.  As part of our continuing mission to provide you with exceptional heart care, we have created designated Provider Care Teams.  These Care Teams include your primary Cardiologist (physician) and Advanced Practice Providers (APPs -  Physician Assistants and Nurse Practitioners) who all work together to provide you with the care you need, when you need it. You will need a follow up appointment in 1 years.  Please call our office 2 months in advance to schedule this appointment.  You may see Chanetta Marshall NP or one of the following Advanced Practice Providers on your designated Care Team:   Chanetta Marshall, NP . Tommye Standard, PA-C  Any Other Special Instructions Will Be Listed Below (If Applicable).

## 2018-01-14 DIAGNOSIS — Z08 Encounter for follow-up examination after completed treatment for malignant neoplasm: Secondary | ICD-10-CM | POA: Diagnosis not present

## 2018-01-14 DIAGNOSIS — Z85828 Personal history of other malignant neoplasm of skin: Secondary | ICD-10-CM | POA: Diagnosis not present

## 2018-01-14 DIAGNOSIS — B0229 Other postherpetic nervous system involvement: Secondary | ICD-10-CM | POA: Diagnosis not present

## 2018-01-21 ENCOUNTER — Telehealth: Payer: Self-pay | Admitting: Internal Medicine

## 2018-01-21 NOTE — Telephone Encounter (Signed)
Copied from [place] [id number]. Topic: Quick Communication - See Telephone Encounter >> Jan 21, 2018 12:16 PM Rutherford Nail, NT wrote: CRM for notification. See Telephone encounter for: 01/21/18. Patient's son, Chriss Czar, calling and states that he is needing an FL2 form filled out. States that they are thinking of placing her into a nursing home. CB#: [phone]

## 2018-01-25 ENCOUNTER — Telehealth: Payer: Self-pay | Admitting: Internal Medicine

## 2018-01-25 NOTE — Telephone Encounter (Signed)
Even with taking the Lasix 80 mg first thing in the morning?  We can try a lower dose, but they would have to monitor very closely for increased leg swelling and shortness of breath-she may become fluid overloaded.  Have him try it and just keep a close eye on her and call with any questions.

## 2018-01-25 NOTE — Telephone Encounter (Signed)
Spoke with son and advised of note below. He states he is going to keep lasix the way it is and keep giving it to her in the morning. They had just started trying it in the mornings instead of twice a day. He does not want to do anything to make her sxs worse so he prefers just leaving it alone.

## 2018-01-25 NOTE — Telephone Encounter (Signed)
Copied from [place] [id number]. Topic: General - Other >> Jan 25, 2018 11:29 AM Lennox Solders wrote: Reason for CRM: pt son randy is calling and would like to know if his mother could stop taking  furosemide 80 mg and take only furosemide 40  mg due to she is wetting herself at night. Please advise

## 2018-02-02 ENCOUNTER — Encounter: Payer: Self-pay | Admitting: Cardiology

## 2018-02-04 ENCOUNTER — Telehealth: Payer: Self-pay | Admitting: Internal Medicine

## 2018-02-04 DIAGNOSIS — F0390 Unspecified dementia without behavioral disturbance: Secondary | ICD-10-CM

## 2018-02-04 DIAGNOSIS — N183 Chronic kidney disease, stage 3 unspecified: Secondary | ICD-10-CM

## 2018-02-04 DIAGNOSIS — F03A0 Unspecified dementia, mild, without behavioral disturbance, psychotic disturbance, mood disturbance, and anxiety: Secondary | ICD-10-CM

## 2018-02-04 NOTE — Telephone Encounter (Signed)
Copied from [place] [id number]. Topic: Referral - Request for Referral >> Feb 04, 2018 11:39 AM Margot Ables wrote: Has patient seen PCP for this complaint? No - last OV 11/30/17 *If NO, is insurance requiring patient see PCP for this issue before PCP can refer them? Referral for which specialty: Hospice Eval Preferred provider/office: [facility] Reason for referral: Juanda Crumble states that pts son, Ron, has requested a hospice eval. Please fax referral, demos, and recent OV notes to [facility], if approved. Fax # [phone]

## 2018-02-04 NOTE — Telephone Encounter (Signed)
Referral and office notes faxed over.  

## 2018-03-08 NOTE — Progress Notes (Signed)
HPI: FU coronary disease status post bypassing graft, history of thoracic aortic aneurysm repair, as well as aortic valve replacement and paroxysmal atrial fibrillation. Last Myoview was performed on September 02, 2006. At that time she had normal perfusion with an ejection fraction of 76%. Carotid Dopplers performed in Sept 2011 revealed normal carotids. Note she has also had problems with dementia with psychotic features. The patient was found to have symptomatic high degree AV block in October of 2013 and had a pacemaker placed. Echocardiogram April 2014 showed normal LV function, moderate left ventricular hypertrophy, bioprosthetic aortic valve with a mean gradient of 20 mmHg and mild left atrial enlargement. Since last seen,she has some dyspnea.  No chest pain, palpitations or syncope.  No pedal edema.  Current Outpatient Medications  Medication Sig Dispense Refill  . acetaminophen (TYLENOL) 325 MG tablet Take 650 mg by mouth every 6 (six) hours as needed (pain).     Marland Kitchen alendronate (FOSAMAX) 70 MG tablet Take 1 tablet (70 mg total) by mouth every 7 (seven) days. Take with a full glass of water on an empty stomach. 4 tablet 11  . amLODipine (NORVASC) 5 MG tablet Take 1 tablet (5 mg total) by mouth daily. 90 tablet 1  . cholecalciferol (VITAMIN D) 1000 UNITS tablet Take 1,000 Units by mouth daily with breakfast.     . citalopram (CELEXA) 20 MG tablet TAKE 1 TABLET BY MOUTH ONCE DAILY AT  6PM 90 tablet 1  . folic acid (FOLVITE) 1 MG tablet TAKE 1 TABLET BY MOUTH ONCE DAILY 90 tablet 1  . furosemide (LASIX) 40 MG tablet TAKE 2 TABLETS BY MOUTH ONCE DAILY 180 tablet 1  . Lidocaine HCl (LIDOCAINE PLUS) 4 % CREA Apply 1 application topically 3 (three) times daily.    . pravastatin (PRAVACHOL) 40 MG tablet TAKE 1 TABLET BY MOUTH AT BEDTIME 90 tablet 1  . risperiDONE (RISPERDAL) 0.25 MG tablet TAKE 1 TABLET BY MOUTH AT BEDTIME 90 tablet 1   No current facility-administered medications for this visit.       Past Medical History:  Diagnosis Date  . Anemia, unspecified   . Anxiety state, unspecified   . Atrial fibrillation (HCC)    not anticoagulation candidate due to falls  . CAD (coronary artery disease)    s/p CABG  . Carotid artery stenosis   . Complete heart block ([place]) 12/2011   s/p St.Jude dual chamber PPM 01/12/12  . Dementia ([place])   . Dementia ([place]) 05/09/2017  . Diverticulosis of colon (without mention of hemorrhage)   . Esophageal reflux   . HTN (hypertension)   . Hyperlipidemia   . Osteoarthrosis, unspecified whether generalized or localized, unspecified site   . Osteoporosis, unspecified   . Pacemaker-St.Jude 01/13/2012  . S/P AVR (aortic valve replacement) 2003   bioprosthetic   . Thoracic aortic aneurysm ([place]) 2003   s/p repair  . Unspecified chronic bronchitis ([place])     Past Surgical History:  Procedure Laterality Date  . AORTIC VALVE REPLACEMENT  2003  . CATARACT EXTRACTION     bilateral  . CORONARY ARTERY BYPASS GRAFT    . INTRAOCULAR LENS INSERTION     bilateral  . PACEMAKER INSERTION  01/12/12   SJM Accent DR RF implanted by Dr Rayann Heman for CHB  . PERMANENT PACEMAKER INSERTION N/A 01/12/2012   Procedure: PERMANENT PACEMAKER INSERTION;  Surgeon: Thompson Grayer, MD;  Location: [facility] CATH LAB;  Service: Cardiovascular;  Laterality: N/A;  . TOTAL HIP ARTHROPLASTY  1998   left  . VESICOVAGINAL FISTULA CLOSURE W/ TAH      Social History   Socioeconomic History  . Marital status: Married    Spouse name: [name]  . Number of children: Not on file  . Years of education: Not on file  . Highest education level: Not on file  Occupational History  . Occupation: retired  Scientific laboratory technician  . Financial resource strain: Not on file  . Food insecurity:    Worry: Not on file    Inability: Not on file  . Transportation needs:    Medical: Not on file    Non-medical: Not on file  Tobacco Use  . Smoking status: Never Smoker  . Smokeless tobacco: Never Used    Substance and Sexual Activity  . Alcohol use: No  . Drug use: No  . Sexual activity: Not on file  Lifestyle  . Physical activity:    Days per week: Not on file    Minutes per session: Not on file  . Stress: Not on file  Relationships  . Social connections:    Talks on phone: Not on file    Gets together: Not on file    Attends religious service: Not on file    Active member of club or organization: Not on file    Attends meetings of clubs or organizations: Not on file    Relationship status: Not on file  . Intimate partner violence:    Fear of current or ex partner: Not on file    Emotionally abused: Not on file    Physically abused: Not on file    Forced sexual activity: Not on file  Other Topics Concern  . Not on file  Social History Narrative   Lives with husband. Mother had kidney problems.  Father had coronary artery  disease.  Both died when the patient was young.  She does not know how old  they were.  She has one brother who died at age 36 of a CVA, although, he  had coronary artery disease and diabetes.  Another brother died at age 61 of  a stroke.  He also had coronary artery disease.  One sister is living, she  has coronary artery disease and diabetes and one sister has anginal symptoms. Her sister has a pacemaker (history palpitations).           Family History  Problem Relation Age of Onset  . Diabetes Brother   . Diabetes Sister     ROS: no fevers or chills, productive cough, hemoptysis, dysphasia, odynophagia, melena, hematochezia, dysuria, hematuria, rash, seizure activity, orthopnea, PND, pedal edema, claudication. Remaining systems are negative.  Physical Exam: Well-developed well-nourished in no acute distress.  Skin is warm and dry.  HEENT is normal.  Neck is supple.  Chest is clear to auscultation with normal expansion.  Cardiovascular exam is regular rate and rhythm.  2/6 systolic murmur left sternal border.  No diastolic murmur. Abdominal exam  nontender or distended. No masses palpated. Extremities show no edema. neuro grossly intact  ECG-sinus rhythm at a rate of 69, cannot rule out septal infarct, nonspecific ST changes, IVCD.  Personally reviewed  A/P  1 coronary artery disease-plan to continue statin.  Add aspirin 81 mg daily.  2 prior aortic valve replacement-continue SBE prophylaxis.  3 hypertension-patient's blood pressure is controlled.  Continue present medications and follow.  4 hyperlipidemia-continue statin.  5 paroxysmal atrial fibrillation-patient is in sinus rhythm on examination.  She is not a  candidate for anticoagulation given dementia and risk of fall.  6 prior pacemaker-followed by electrophysiology.  Kirk Ruths, MD

## 2018-03-11 LAB — CUP PACEART REMOTE DEVICE CHECK
Battery Remaining Longevity: 77 mo
Battery Remaining Percentage: 57 %
Brady Statistic AP VP Percent: 5.7 %
Brady Statistic AS VP Percent: 47 %
Brady Statistic AS VS Percent: 43 %
Date Time Interrogation Session: 20191008193902
Implantable Lead Implant Date: 20131022
Implantable Lead Implant Date: 20131022
Implantable Lead Location: 753859
Implantable Pulse Generator Implant Date: 20131022
Lead Channel Impedance Value: 450 Ohm
Lead Channel Impedance Value: 600 Ohm
Lead Channel Pacing Threshold Pulse Width: 0.4 ms
Lead Channel Sensing Intrinsic Amplitude: 6.8 mV
Lead Channel Setting Pacing Amplitude: 1.125
Lead Channel Setting Pacing Amplitude: 1.375
MDC IDC LEAD LOCATION: 753860
MDC IDC MSMT BATTERY VOLTAGE: 2.89 V
MDC IDC MSMT LEADCHNL RA PACING THRESHOLD AMPLITUDE: 0.375 V
MDC IDC MSMT LEADCHNL RA PACING THRESHOLD PULSEWIDTH: 0.4 ms
MDC IDC MSMT LEADCHNL RA SENSING INTR AMPL: 5 mV
MDC IDC MSMT LEADCHNL RV PACING THRESHOLD AMPLITUDE: 0.875 V
MDC IDC SET LEADCHNL RV PACING PULSEWIDTH: 0.4 ms
MDC IDC SET LEADCHNL RV SENSING SENSITIVITY: 2 mV
MDC IDC STAT BRADY AP VS PERCENT: 3.9 %
MDC IDC STAT BRADY RA PERCENT PACED: 8.9 %
MDC IDC STAT BRADY RV PERCENT PACED: 53 %
Pulse Gen Model: 2210
Pulse Gen Serial Number: 7393244

## 2018-03-21 ENCOUNTER — Ambulatory Visit (INDEPENDENT_AMBULATORY_CARE_PROVIDER_SITE_OTHER): Payer: Medicare Other | Admitting: Cardiology

## 2018-03-21 ENCOUNTER — Encounter: Payer: Self-pay | Admitting: Cardiology

## 2018-03-21 VITALS — BP 110/68 | HR 69 | Ht 62.0 in | Wt 170.4 lb

## 2018-03-21 DIAGNOSIS — I480 Paroxysmal atrial fibrillation: Secondary | ICD-10-CM

## 2018-03-21 DIAGNOSIS — I2510 Atherosclerotic heart disease of native coronary artery without angina pectoris: Secondary | ICD-10-CM | POA: Diagnosis not present

## 2018-03-21 DIAGNOSIS — Z952 Presence of prosthetic heart valve: Secondary | ICD-10-CM | POA: Diagnosis not present

## 2018-03-21 DIAGNOSIS — I10 Essential (primary) hypertension: Secondary | ICD-10-CM

## 2018-03-21 MED ORDER — ASPIRIN EC 81 MG PO TBEC: 81.0000 mg | DELAYED_RELEASE_TABLET | Freq: Every day | ORAL | 3 refills | Status: DC

## 2018-03-21 NOTE — Patient Instructions (Signed)
Medication Instructions:  START ASPIRIN 81 MG ONCE DAILY If you need a refill on your cardiac medications before your next appointment, please call your pharmacy.   Lab work: If you have labs (blood work) drawn today and your tests are completely normal, you will receive your results only by: Marland Kitchen MyChart Message (if you have MyChart) OR . A paper copy in the mail If you have any lab test that is abnormal or we need to change your treatment, we will call you to review the results.  Follow-Up: At [facility], you and your health needs are our priority.  As part of our continuing mission to provide you with exceptional heart care, we have created designated Provider Care Teams.  These Care Teams include your primary Cardiologist (physician) and Advanced Practice Providers (APPs -  Physician Assistants and Nurse Practitioners) who all work together to provide you with the care you need, when you need it. You will need a follow up appointment in 12 months.  Please call our office 2 months in advance to schedule this appointment.  You may see Kirk Ruths MD or one of the following Advanced Practice Providers on your designated Care Team:   Kerin Ransom, PA-C Roby Lofts, [place] . Sande Rives, PA-C  CALL IN September FOR AN APPOINTMENT IN [place]

## 2018-03-25 ENCOUNTER — Telehealth: Payer: Self-pay | Admitting: Cardiology

## 2018-03-25 NOTE — Telephone Encounter (Signed)
Take 81 mg of aspirin daily only. Kirk Ruths, MD

## 2018-03-25 NOTE — Telephone Encounter (Signed)
Spoke with pt son, Aware of dr crenshaw's recommendations.  

## 2018-03-25 NOTE — Telephone Encounter (Signed)
New messge:   Patient son calling concerning his mother medication. Please call son at [phone]

## 2018-03-25 NOTE — Telephone Encounter (Signed)
Spoke with pt son, he reports the patient is taking 325 mg ecotrin and they want to know if she also needs to take the 81 mg aspirin also. Will forward for dr Stanford Breed review

## 2018-03-29 ENCOUNTER — Ambulatory Visit (INDEPENDENT_AMBULATORY_CARE_PROVIDER_SITE_OTHER): Payer: Medicare Other

## 2018-03-29 ENCOUNTER — Other Ambulatory Visit: Payer: Self-pay | Admitting: Internal Medicine

## 2018-03-29 DIAGNOSIS — I442 Atrioventricular block, complete: Secondary | ICD-10-CM | POA: Diagnosis not present

## 2018-03-30 NOTE — Progress Notes (Signed)
Remote pacemaker transmission.   

## 2018-03-31 LAB — CUP PACEART REMOTE DEVICE CHECK
Battery Voltage: 2.89 V
Brady Statistic AP VP Percent: 1.6 %
Brady Statistic AS VP Percent: 42 %
Brady Statistic AS VS Percent: 43 %
Brady Statistic RV Percent Paced: 44 %
Date Time Interrogation Session: 20200108014348
Implantable Lead Implant Date: 20131022
Implantable Lead Implant Date: 20131022
Implantable Lead Location: 753859
Implantable Lead Model: 1944
Lead Channel Impedance Value: 610 Ohm
Lead Channel Pacing Threshold Amplitude: 0.375 V
Lead Channel Pacing Threshold Pulse Width: 0.4 ms
Lead Channel Sensing Intrinsic Amplitude: 6.5 mV
Lead Channel Setting Pacing Amplitude: 1 V
Lead Channel Setting Pacing Amplitude: 1.375
Lead Channel Setting Pacing Pulse Width: 0.4 ms
Lead Channel Setting Sensing Sensitivity: 2 mV
MDC IDC LEAD LOCATION: 753860
MDC IDC MSMT BATTERY REMAINING LONGEVITY: 77 mo
MDC IDC MSMT BATTERY REMAINING PERCENTAGE: 57 %
MDC IDC MSMT LEADCHNL RA IMPEDANCE VALUE: 450 Ohm
MDC IDC MSMT LEADCHNL RA PACING THRESHOLD PULSEWIDTH: 0.4 ms
MDC IDC MSMT LEADCHNL RA SENSING INTR AMPL: 5 mV
MDC IDC MSMT LEADCHNL RV PACING THRESHOLD AMPLITUDE: 0.75 V
MDC IDC PG IMPLANT DT: 20131022
MDC IDC STAT BRADY AP VS PERCENT: 13 %
MDC IDC STAT BRADY RA PERCENT PACED: 14 %
Pulse Gen Model: 2210
Pulse Gen Serial Number: 7393244

## 2018-04-05 DIAGNOSIS — H61303 Acquired stenosis of external ear canal, unspecified, bilateral: Secondary | ICD-10-CM | POA: Diagnosis not present

## 2018-04-05 DIAGNOSIS — H903 Sensorineural hearing loss, bilateral: Secondary | ICD-10-CM | POA: Diagnosis not present

## 2018-04-05 DIAGNOSIS — H6123 Impacted cerumen, bilateral: Secondary | ICD-10-CM | POA: Diagnosis not present

## 2018-04-17 ENCOUNTER — Other Ambulatory Visit: Payer: Self-pay | Admitting: Internal Medicine

## 2018-04-18 ENCOUNTER — Other Ambulatory Visit: Payer: Self-pay | Admitting: Internal Medicine

## 2018-05-15 ENCOUNTER — Other Ambulatory Visit: Payer: Self-pay | Admitting: Internal Medicine

## 2018-05-27 ENCOUNTER — Other Ambulatory Visit: Payer: Self-pay | Admitting: Internal Medicine

## 2018-06-01 NOTE — Progress Notes (Signed)
Subjective:    Patient ID: [name], female    DOB: [birth year], 85 y.o.   MRN: [id number]  HPI   Patient Active Problem List   Diagnosis Date Noted  . Rash and nonspecific skin eruption 11/30/2017  . Acute cystitis without hematuria 09/11/2017  . Prediabetes 09/09/2016  . CKD (chronic kidney disease) stage 3, GFR 30-59 ml/min (HCC) 09/10/2015  . Arthritis of right hip, severe 09/10/2015  . Pacemaker-St.Jude 01/13/2012  . Second degree Mobitz II AV block 01/11/2012  . CAROTID STENOSIS 11/29/2008  . Hearing loss 10/12/2008  . Osteoporosis 05/08/2008  . AORTIC VALVE REPLACEMENT, HX OF 05/08/2008  . Hyperlipidemia 04/13/2007  . Mild dementia ([place]) 04/13/2007  . Coronary atherosclerosis 04/13/2007  . Paroxysmal atrial fibrillation ([place]) 04/13/2007  . DIVERTICULOSIS OF COLON 04/13/2007  . Anxiety state 01/18/2007  . Essential hypertension 01/18/2007    Current Outpatient Medications on File Prior to Visit  Medication Sig Dispense Refill  . acetaminophen (TYLENOL) 325 MG tablet Take 650 mg by mouth every 6 (six) hours as needed (pain).     Marland Kitchen alendronate (FOSAMAX) 70 MG tablet TAKE 1 TABLET BY MOUTH EVERY 7 DAYS, TAKE WITH A FULL GLASS OF WATER ON AN EMPTY STOMACH 12 tablet 1  . amLODipine (NORVASC) 5 MG tablet TAKE 1 TABLET BY MOUTH ONCE DAILY 90 tablet 0  . aspirin EC 81 MG tablet Take 1 tablet (81 mg total) by mouth daily. 90 tablet 3  . cholecalciferol (VITAMIN D) 1000 UNITS tablet Take 1,000 Units by mouth daily with breakfast.     . citalopram (CELEXA) 20 MG tablet TAKE 1 TABLET BY MOUTH ONCE DAILY AT  6PM 90 tablet 1  . folic acid (FOLVITE) 1 MG tablet TAKE 1 TABLET BY MOUTH ONCE DAILY 90 tablet 1  . furosemide (LASIX) 40 MG tablet TAKE 2 TABLETS BY MOUTH ONCE DAILY 180 tablet 1  . Lidocaine HCl (LIDOCAINE PLUS) 4 % CREA Apply 1 application topically 3 (three) times daily.    . pravastatin (PRAVACHOL) 40 MG tablet TAKE 1 TABLET BY MOUTH AT BEDTIME 90 tablet 1  .  risperiDONE (RISPERDAL) 0.25 MG tablet TAKE 1 TABLET BY MOUTH AT BEDTIME 90 tablet 0   No current facility-administered medications on file prior to visit.     Past Medical History:  Diagnosis Date  . Anemia, unspecified   . Anxiety state, unspecified   . Atrial fibrillation (HCC)    not anticoagulation candidate due to falls  . CAD (coronary artery disease)    s/p CABG  . Carotid artery stenosis   . Complete heart block ([place]) 12/2011   s/p St.Jude dual chamber PPM 01/12/12  . Dementia ([place])   . Dementia ([place]) 05/09/2017  . Diverticulosis of colon (without mention of hemorrhage)   . Esophageal reflux   . HTN (hypertension)   . Hyperlipidemia   . Osteoarthrosis, unspecified whether generalized or localized, unspecified site   . Osteoporosis, unspecified   . Pacemaker-St.Jude 01/13/2012  . S/P AVR (aortic valve replacement) 2003   bioprosthetic   . Thoracic aortic aneurysm ([place]) 2003   s/p repair  . Unspecified chronic bronchitis ([place])     Past Surgical History:  Procedure Laterality Date  . AORTIC VALVE REPLACEMENT  2003  . CATARACT EXTRACTION     bilateral  . CORONARY ARTERY BYPASS GRAFT    . INTRAOCULAR LENS INSERTION     bilateral  . PACEMAKER INSERTION  01/12/12   SJM Accent DR RF implanted by  Dr Rayann Heman for CHB  . PERMANENT PACEMAKER INSERTION N/A 01/12/2012   Procedure: PERMANENT PACEMAKER INSERTION;  Surgeon: Thompson Grayer, MD;  Location: [facility] CATH LAB;  Service: Cardiovascular;  Laterality: N/A;  . [place]   left  . VESICOVAGINAL FISTULA CLOSURE W/ TAH      Social History   Socioeconomic History  . Marital status: Married    Spouse name: [name]  . Number of children: Not on file  . Years of education: Not on file  . Highest education level: Not on file  Occupational History  . Occupation: retired  Scientific laboratory technician  . Financial resource strain: Not on file  . Food insecurity:    Worry: Not on file    Inability: Not on file  .  Transportation needs:    Medical: Not on file    Non-medical: Not on file  Tobacco Use  . Smoking status: Never Smoker  . Smokeless tobacco: Never Used  Substance and Sexual Activity  . Alcohol use: No  . Drug use: No  . Sexual activity: Not on file  Lifestyle  . Physical activity:    Days per week: Not on file    Minutes per session: Not on file  . Stress: Not on file  Relationships  . Social connections:    Talks on phone: Not on file    Gets together: Not on file    Attends religious service: Not on file    Active member of club or organization: Not on file    Attends meetings of clubs or organizations: Not on file    Relationship status: Not on file  Other Topics Concern  . Not on file  Social History Narrative   Lives with husband. Mother had kidney problems.  Father had coronary artery  disease.  Both died when the patient was young.  She does not know how old  they were.  She has one brother who died at age 68 of a CVA, although, he  had coronary artery disease and diabetes.  Another brother died at age 32 of  a stroke.  He also had coronary artery disease.  One sister is living, she  has coronary artery disease and diabetes and one sister has anginal symptoms. Her sister has a pacemaker (history palpitations).           Family History  Problem Relation Age of Onset  . Diabetes Brother   . Diabetes Sister     Review of Systems     Objective:  There were no vitals filed for this visit. BP Readings from Last 3 Encounters:  03/21/18 110/68  01/13/18 120/80  11/30/17 134/62   Wt Readings from Last 3 Encounters:  03/21/18 170 lb 6.4 oz (77.3 kg)  01/13/18 175 lb 3.2 oz (79.5 kg)  11/30/17 177 lb (80.3 kg)   There is no height or weight on file to calculate BMI.   Physical Exam          Assessment & Plan:    See Problem List for Assessment and Plan of chronic medical problems.   This encounter was created in error - please disregard.

## 2018-06-02 ENCOUNTER — Encounter: Payer: Medicare Other | Admitting: Internal Medicine

## 2018-06-07 NOTE — Progress Notes (Signed)
Subjective:    Patient ID: [name], female    DOB: [birth year], 85 y.o.   MRN: [id number]  HPI The patient is here for follow up.  She is here with her son and daughter-in-law.  They provide the history because she does have dementia and is unable to provide her own history.  Sob with exertion: She does have chronic shortness of breath with exertion, but her family has noticed this has gotten worse.  She tends to get short of breath with any activity.  She does follow with cardiology and her last visit with Dr. Stanford Breed was in December.  Her family has not noted any increased leg swelling and there has been no complaints of chest pain or other symptoms.  She is very sedentary.  Prediabetes:  She is compliant with a low sugar/carbohydrate diet.  She is very sedentary.  CAD, hyperlipidemia, Afib, Hypertension: She is taking her medication daily. She is compliant with a low sodium diet.   Her family denies her ever complaining of any chest discomfort.  CKD:  She does not take nsaids.  She is not following with nephrology.  Anxiety: She is taking her medication daily as prescribed.  Her family denies any concerning signs or symptoms of anxiety.  Dementia with agitation:  She takes risperdal nightly.  She sleeps good.  She does get up at night because of the bathroom, but other wise sleeps well.  There is been no concerns regarding agitation.  She is eating well.  She has incontinence at nght and during the day.  She will not want depends.  She does have some mild skin irritation and her daughter-in-law is trying to keep her clean and dry.  She typically knows when she has an episode of incontinence and we will change.  She does have some redness in her inner thighs.   Medications and allergies reviewed with patient and updated if appropriate.  Patient Active Problem List   Diagnosis Date Noted  . Rash and nonspecific skin eruption 11/30/2017  . Prediabetes 09/09/2016  . CKD (chronic  kidney disease) stage 3, GFR 30-59 ml/min (HCC) 09/10/2015  . Arthritis of right hip, severe 09/10/2015  . Pacemaker-St.Jude 01/13/2012  . Second degree Mobitz II AV block 01/11/2012  . CAROTID STENOSIS 11/29/2008  . Hearing loss 10/12/2008  . Osteoporosis 05/08/2008  . AORTIC VALVE REPLACEMENT, HX OF 05/08/2008  . Hyperlipidemia 04/13/2007  . Mild dementia ([place]) 04/13/2007  . Coronary atherosclerosis 04/13/2007  . Paroxysmal atrial fibrillation ([place]) 04/13/2007  . DIVERTICULOSIS OF COLON 04/13/2007  . Anxiety state 01/18/2007  . Essential hypertension 01/18/2007    Current Outpatient Medications on File Prior to Visit  Medication Sig Dispense Refill  . acetaminophen (TYLENOL) 325 MG tablet Take 650 mg by mouth every 6 (six) hours as needed (pain).     Marland Kitchen alendronate (FOSAMAX) 70 MG tablet TAKE 1 TABLET BY MOUTH EVERY 7 DAYS, TAKE WITH A FULL GLASS OF WATER ON AN EMPTY STOMACH 12 tablet 1  . amLODipine (NORVASC) 5 MG tablet TAKE 1 TABLET BY MOUTH ONCE DAILY 90 tablet 0  . aspirin EC 81 MG tablet Take 1 tablet (81 mg total) by mouth daily. 90 tablet 3  . cholecalciferol (VITAMIN D) 1000 UNITS tablet Take 1,000 Units by mouth daily with breakfast.     . citalopram (CELEXA) 20 MG tablet TAKE 1 TABLET BY MOUTH ONCE DAILY AT  6PM 90 tablet 1  . folic acid (FOLVITE) 1 MG tablet TAKE  1 TABLET BY MOUTH ONCE DAILY 90 tablet 1  . furosemide (LASIX) 40 MG tablet TAKE 2 TABLETS BY MOUTH ONCE DAILY 180 tablet 1  . Lidocaine HCl (LIDOCAINE PLUS) 4 % CREA Apply 1 application topically 3 (three) times daily.    . pravastatin (PRAVACHOL) 40 MG tablet TAKE 1 TABLET BY MOUTH AT BEDTIME 90 tablet 1  . risperiDONE (RISPERDAL) 0.25 MG tablet TAKE 1 TABLET BY MOUTH AT BEDTIME 90 tablet 0   No current facility-administered medications on file prior to visit.     Past Medical History:  Diagnosis Date  . Anemia, unspecified   . Anxiety state, unspecified   . Atrial fibrillation (HCC)    not  anticoagulation candidate due to falls  . CAD (coronary artery disease)    s/p CABG  . Carotid artery stenosis   . Complete heart block ([place]) 12/2011   s/p St.Jude dual chamber PPM 01/12/12  . Dementia ([place])   . Dementia ([place]) 05/09/2017  . Diverticulosis of colon (without mention of hemorrhage)   . Esophageal reflux   . HTN (hypertension)   . Hyperlipidemia   . Osteoarthrosis, unspecified whether generalized or localized, unspecified site   . Osteoporosis, unspecified   . Pacemaker-St.Jude 01/13/2012  . S/P AVR (aortic valve replacement) 2003   bioprosthetic   . Thoracic aortic aneurysm ([place]) 2003   s/p repair  . Unspecified chronic bronchitis ([place])     Past Surgical History:  Procedure Laterality Date  . AORTIC VALVE REPLACEMENT  2003  . CATARACT EXTRACTION     bilateral  . CORONARY ARTERY BYPASS GRAFT    . INTRAOCULAR LENS INSERTION     bilateral  . PACEMAKER INSERTION  01/12/12   SJM Accent DR RF implanted by Dr Rayann Heman for CHB  . PERMANENT PACEMAKER INSERTION N/A 01/12/2012   Procedure: PERMANENT PACEMAKER INSERTION;  Surgeon: Thompson Grayer, MD;  Location[facility] CATH LAB;  Service: Cardiovascular;  Laterality: N/A;  . [place]   left  . VESICOVAGINAL FISTULA CLOSURE W/ TAH      Social History   Socioeconomic History  . Marital status: Married    Spouse name: [name]  . Number of children: Not on file  . Years of education: Not on file  . Highest education level: Not on file  Occupational History  . Occupation: retired  Scientific laboratory technician  . Financial resource strain: Not on file  . Food insecurity:    Worry: Not on file    Inability: Not on file  . Transportation needs:    Medical: Not on file    Non-medical: Not on file  Tobacco Use  . Smoking status: Never Smoker  . Smokeless tobacco: Never Used  Substance and Sexual Activity  . Alcohol use: No  . Drug use: No  . Sexual activity: Not on file  Lifestyle  . Physical activity:    Days per  week: Not on file    Minutes per session: Not on file  . Stress: Not on file  Relationships  . Social connections:    Talks on phone: Not on file    Gets together: Not on file    Attends religious service: Not on file    Active member of club or organization: Not on file    Attends meetings of clubs or organizations: Not on file    Relationship status: Not on file  Other Topics Concern  . Not on file  Social History Narrative   Lives with husband.  Mother had kidney problems.  Father had coronary artery  disease.  Both died when the patient was young.  She does not know how old  they were.  She has one brother who died at age 69 of a CVA, although, he  had coronary artery disease and diabetes.  Another brother died at age 73 of  a stroke.  He also had coronary artery disease.  One sister is living, she  has coronary artery disease and diabetes and one sister has anginal symptoms. Her sister has a pacemaker (history palpitations).           Family History  Problem Relation Age of Onset  . Diabetes Brother   . Diabetes Sister     Review of Systems  Constitutional: Negative for fever.  Respiratory: Positive for shortness of breath (with exertion). Negative for cough and wheezing.   Cardiovascular: Negative for leg swelling.  Musculoskeletal: Positive for arthralgias (hip pain).  Psychiatric/Behavioral: Negative for dysphoric mood. The patient is not nervous/anxious.        Objective:   Vitals:   06/08/18 1059  BP: 132/76  Pulse: 67  Resp: 16  Temp: 98.2 F (36.8 C)  SpO2: 97%   BP Readings from Last 3 Encounters:  06/08/18 132/76  03/21/18 110/68  01/13/18 120/80   Wt Readings from Last 3 Encounters:  06/08/18 175 lb (79.4 kg)  03/21/18 170 lb 6.4 oz (77.3 kg)  01/13/18 175 lb 3.2 oz (79.5 kg)   Body mass index is 32.01 kg/m.   Physical Exam    Constitutional: Appears well-developed and well-nourished. No distress.  HENT:  Head: Normocephalic and atraumatic.   Neck: Neck supple. No tracheal deviation present. No thyromegaly present.  No cervical lymphadenopathy Cardiovascular: Normal rate, regular rhythm and normal heart sounds.   1/6 systolic murmur heard. No carotid bruit .  No edema Pulmonary/Chest: Effort normal and breath sounds normal. No respiratory distress. No has no wheezes. No rales. Abdomen: Soft, nontender, nondistended Skin: Skin is warm and dry. Not diaphoretic.  Psychiatric: Normal mood and affect.       Assessment & Plan:    See Problem List for Assessment and Plan of chronic medical problems.

## 2018-06-07 NOTE — Patient Instructions (Addendum)
  Tests ordered today. Your results will be released to [place] (or called to you) after review, usually within 72hours after test completion. If any changes need to be made, you will be notified at that same time.  Medications reviewed and updated.  Changes include :   nystatin powder as needed  Your prescription(s) have been submitted to your pharmacy. Please take as directed and contact our office if you believe you are having problem(s) with the medication(s).   Please followup in 6 months

## 2018-06-08 ENCOUNTER — Other Ambulatory Visit: Payer: Self-pay

## 2018-06-08 ENCOUNTER — Other Ambulatory Visit (INDEPENDENT_AMBULATORY_CARE_PROVIDER_SITE_OTHER): Payer: Medicare Other

## 2018-06-08 ENCOUNTER — Ambulatory Visit: Payer: Medicare Other | Admitting: Internal Medicine

## 2018-06-08 ENCOUNTER — Encounter: Payer: Self-pay | Admitting: Internal Medicine

## 2018-06-08 ENCOUNTER — Ambulatory Visit (INDEPENDENT_AMBULATORY_CARE_PROVIDER_SITE_OTHER): Payer: Medicare Other | Admitting: Internal Medicine

## 2018-06-08 VITALS — BP 132/76 | HR 67 | Temp 98.2°F | Resp 16 | Ht 62.0 in | Wt 175.0 lb

## 2018-06-08 DIAGNOSIS — E7849 Other hyperlipidemia: Secondary | ICD-10-CM | POA: Diagnosis not present

## 2018-06-08 DIAGNOSIS — I2510 Atherosclerotic heart disease of native coronary artery without angina pectoris: Secondary | ICD-10-CM | POA: Diagnosis not present

## 2018-06-08 DIAGNOSIS — R7303 Prediabetes: Secondary | ICD-10-CM

## 2018-06-08 DIAGNOSIS — N183 Chronic kidney disease, stage 3 unspecified: Secondary | ICD-10-CM

## 2018-06-08 DIAGNOSIS — I10 Essential (primary) hypertension: Secondary | ICD-10-CM

## 2018-06-08 DIAGNOSIS — F03A0 Unspecified dementia, mild, without behavioral disturbance, psychotic disturbance, mood disturbance, and anxiety: Secondary | ICD-10-CM

## 2018-06-08 DIAGNOSIS — F0390 Unspecified dementia without behavioral disturbance: Secondary | ICD-10-CM

## 2018-06-08 LAB — COMPREHENSIVE METABOLIC PANEL
ALT: 6 U/L (ref 0–35)
AST: 9 U/L (ref 0–37)
Albumin: 4.1 g/dL (ref 3.5–5.2)
Alkaline Phosphatase: 81 U/L (ref 39–117)
BUN: 23 mg/dL (ref 6–23)
CO2: 30 mEq/L (ref 19–32)
Calcium: 9 mg/dL (ref 8.4–10.5)
Chloride: 101 mEq/L (ref 96–112)
Creatinine, Ser: 1.3 mg/dL — ABNORMAL HIGH (ref 0.40–1.20)
GFR: 38.26 mL/min — ABNORMAL LOW (ref >60.00)
GLUCOSE: 96 mg/dL (ref 70–99)
Potassium: 3.9 mEq/L (ref 3.5–5.1)
Sodium: 138 mEq/L (ref 135–145)
Total Bilirubin: 0.6 mg/dL (ref 0.2–1.2)
Total Protein: 7.2 g/dL (ref 6.0–8.3)

## 2018-06-08 LAB — CBC WITH DIFFERENTIAL/PLATELET
Basophils Absolute: 0 10*3/uL (ref 0.0–0.1)
Basophils Relative: 0.5 % (ref 0.0–3.0)
Eosinophils Absolute: 0.1 10*3/uL (ref 0.0–0.7)
Eosinophils Relative: 1.5 % (ref 0.0–5.0)
HCT: 38.9 % (ref 36.0–46.0)
Hemoglobin: 13.1 g/dL (ref 12.0–15.0)
Lymphocytes Relative: 13.8 % (ref 12.0–46.0)
Lymphs Abs: 0.8 10*3/uL (ref 0.7–4.0)
MCHC: 33.6 g/dL (ref 30.0–36.0)
MCV: 89.9 fl (ref 78.0–100.0)
Monocytes Absolute: 0.5 10*3/uL (ref 0.1–1.0)
Monocytes Relative: 8.9 % (ref 3.0–12.0)
NEUTROS ABS: 4.5 10*3/uL (ref 1.4–7.7)
NEUTROS PCT: 75.3 % (ref 43.0–77.0)
PLATELETS: 205 10*3/uL (ref 150.0–400.0)
RBC: 4.32 Mil/uL (ref 3.87–5.11)
RDW: 15.8 % — ABNORMAL HIGH (ref 11.5–15.5)
WBC: 6 10*3/uL (ref 4.0–10.5)

## 2018-06-08 LAB — LIPID PANEL
CHOLESTEROL: 160 mg/dL (ref 0–200)
HDL: 45.1 mg/dL (ref >39.00)
LDL Cholesterol: 83 mg/dL (ref 0–99)
NonHDL: 114.44
Total CHOL/HDL Ratio: 4
Triglycerides: 155 mg/dL — ABNORMAL HIGH (ref 0.0–149.0)
VLDL: 31 mg/dL (ref 0.0–40.0)

## 2018-06-08 LAB — TSH: TSH: 1.84 u[IU]/mL (ref 0.35–4.50)

## 2018-06-08 LAB — HEMOGLOBIN A1C: Hgb A1c MFr Bld: 5.5 % (ref 4.6–6.5)

## 2018-06-08 MED ORDER — NYSTATIN 100000 UNIT/GM EX POWD: Freq: Four times a day (QID) | CUTANEOUS | 3 refills | Status: DC

## 2018-06-08 NOTE — Assessment & Plan Note (Signed)
Low risk for developing diabetes, but will monitor A1c Do not recommend any changes in diet

## 2018-06-08 NOTE — Assessment & Plan Note (Addendum)
She does have shortness of breath with any exertion, which has gotten worse Her weight and deconditioning is likely contributing There may be some cardiac cause stop underline, which I did discuss with her family-they will mention this to cardiology when they see him next order to make an appointment sooner if needed At this point we will check basic blood work, but did not recommend other evaluation given her dementia and activity level CBC, CMP, TSH

## 2018-06-08 NOTE — Assessment & Plan Note (Signed)
Eating well, sleeping well Doing well with Risperdal at night-agitation controlled No anxiety-taking citalopram daily Still living at home with husband-family helps and they do have people coming to do cleaning and cooking

## 2018-06-08 NOTE — Assessment & Plan Note (Signed)
BP well controlled Current regimen effective and well tolerated Continue current medications at current doses cmp  

## 2018-06-08 NOTE — Assessment & Plan Note (Signed)
CMP, lipid panel Continue pravastatin

## 2018-06-08 NOTE — Assessment & Plan Note (Signed)
CMP

## 2018-06-28 ENCOUNTER — Other Ambulatory Visit: Payer: Self-pay

## 2018-06-28 ENCOUNTER — Ambulatory Visit (INDEPENDENT_AMBULATORY_CARE_PROVIDER_SITE_OTHER): Payer: Medicare Other | Admitting: *Deleted

## 2018-06-28 DIAGNOSIS — I442 Atrioventricular block, complete: Secondary | ICD-10-CM | POA: Diagnosis not present

## 2018-06-28 LAB — CUP PACEART REMOTE DEVICE CHECK
Battery Remaining Longevity: 70 mo
Battery Remaining Percentage: 51 %
Battery Voltage: 2.87 V
Brady Statistic AP VP Percent: 1.1 %
Brady Statistic AP VS Percent: 11 %
Brady Statistic AS VP Percent: 27 %
Brady Statistic AS VS Percent: 60 %
Brady Statistic RA Percent Paced: 12 %
Brady Statistic RV Percent Paced: 29 %
Date Time Interrogation Session: 20200407062116
Implantable Lead Implant Date: 20131022
Implantable Lead Implant Date: 20131022
Implantable Lead Location: 753859
Implantable Lead Location: 753860
Implantable Lead Model: 1944
Implantable Lead Model: 1948
Implantable Pulse Generator Implant Date: 20131022
Lead Channel Impedance Value: 450 Ohm
Lead Channel Impedance Value: 630 Ohm
Lead Channel Pacing Threshold Amplitude: 0.5 V
Lead Channel Pacing Threshold Amplitude: 0.75 V
Lead Channel Pacing Threshold Pulse Width: 0.4 ms
Lead Channel Pacing Threshold Pulse Width: 0.4 ms
Lead Channel Sensing Intrinsic Amplitude: 5 mV
Lead Channel Sensing Intrinsic Amplitude: 6.6 mV
Lead Channel Setting Pacing Amplitude: 1 V
Lead Channel Setting Pacing Amplitude: 1.5 V
Lead Channel Setting Pacing Pulse Width: 0.4 ms
Lead Channel Setting Sensing Sensitivity: 2 mV
Pulse Gen Model: 2210
Pulse Gen Serial Number: 7393244

## 2018-06-29 ENCOUNTER — Other Ambulatory Visit: Payer: Self-pay | Admitting: Internal Medicine

## 2018-07-06 ENCOUNTER — Encounter: Payer: Self-pay | Admitting: Cardiology

## 2018-07-06 NOTE — Progress Notes (Signed)
Remote pacemaker transmission.   

## 2018-08-16 ENCOUNTER — Other Ambulatory Visit: Payer: Self-pay | Admitting: Internal Medicine

## 2018-09-12 ENCOUNTER — Telehealth: Payer: Self-pay | Admitting: Internal Medicine

## 2018-09-12 NOTE — Telephone Encounter (Signed)
Patient's son Yanira Tolsma is calling to schedule appt for his mother for anxiety. Please advise Cb- [phone]

## 2018-09-13 NOTE — Telephone Encounter (Signed)
Noted  

## 2018-09-13 NOTE — Telephone Encounter (Signed)
Spoke with patient's son, he is very upset over the message we received. He states he went over all this with the girl he spoke with last night. And he is not going over it again. He said he would call if he really needed [place] and hung up.

## 2018-09-14 ENCOUNTER — Ambulatory Visit: Payer: Self-pay | Admitting: *Deleted

## 2018-09-14 NOTE — Telephone Encounter (Signed)
Son, [name] called in regarding his mother.   She has bilateral swelling in her feet and ankles that's been going on for a month now.   She can't get her shoes on any more.    This has happened before.  She has had blood on the toilet paper after urinating.  She also has a rash between her upper thighs from where she is incontinent.   "We change her several times a day but it's hard to keep her dry".  I called the office and spoke with Lovena Le.   She is going to check with Dr. Quay Burow before scheduling pt to see if she wants a virtual visit or pt should come in.   I let [name] know he would be getting a call back after they checked with Dr. Quay Burow.  His number is [phone].  [name] was agreeable to this plan.  I sent my notes to the office.  Reason for Disposition . [1] MILD swelling of both ankles (i.e., pedal edema) AND [2] new onset or worsening  Answer Assessment - Initial Assessment Questions 1. ONSET: "When did the swelling start?" (e.g., minutes, hours, days)     Both of her legs, feet and ankles very swollen equally for about a month.   She's has this before.   She takes Lasix.   She has dementia that is worse.  - Localized - small area of swelling localized to one leg  - MILD pedal edema - swelling limited to foot and ankle, pitting edema < 1/4 inch (6 mm) deep, rest and elevation eliminate most or all swelling  - MODERATE edema - swelling of lower leg to knee, pitting edema > 1/4 inch (6 mm) deep, rest and elevation only partially reduce swelling  - SEVERE edema - swelling extends above knee, facial or hand swelling present      Moderate   Ankles and feet swollen for a month.    Had this before. 4. REDNESS: "Does the swelling look red or infected?"     No wounds. 5. PAIN: "Is the swelling painful to touch?" If so, ask: "How painful is it?"   (Scale 1-10; mild, moderate or severe)     Not really 6. FEVER: "Do you have a fever?" If so, ask: "What is it, how was it measured, and when  did it start?"      No 7. CAUSE: "What do you think is causing the leg swelling?"     Yes  Heart problems 8. MEDICAL HISTORY: "Do you have a history of heart failure, kidney disease, liver failure, or cancer?"     Heart problems 9. RECURRENT SYMPTOM: "Have you had leg swelling before?" If so, ask: "When was the last time?" "What happened that time?"     Yes 10. OTHER SYMPTOMS: "Do you have any other symptoms?" (e.g., chest pain, difficulty breathing)       Short of breath     Blood on paper after urinates 11. PREGNANCY: "Is there any chance you are pregnant?" "When was your last menstrual period?"       N/A  Protocols used: LEG SWELLING AND EDEMA-A-AH

## 2018-09-14 NOTE — Telephone Encounter (Signed)
Can all of these concerns be handled virtually or does pt need to come into the office? Pt has 6 for Covid risk score.

## 2018-09-14 NOTE — Telephone Encounter (Signed)
We can try virtual

## 2018-09-15 NOTE — Telephone Encounter (Signed)
Spoke with pt's son and he states that he needs an appt tomorrow due to work. Appt has been scheduled with Mickel Baas. Son also states that if any type of lab work needs to be done that pt does have home health and can do it at home rather than pt coming to office. Advised that we would schedule appt with Mickel Baas and if we needed to follow-up with Dr Quay Burow next week, we could schedule after visit.

## 2018-09-16 ENCOUNTER — Ambulatory Visit (INDEPENDENT_AMBULATORY_CARE_PROVIDER_SITE_OTHER): Payer: Medicare Other | Admitting: Family

## 2018-09-16 ENCOUNTER — Encounter: Payer: Self-pay | Admitting: Family

## 2018-09-16 DIAGNOSIS — R319 Hematuria, unspecified: Secondary | ICD-10-CM | POA: Diagnosis not present

## 2018-09-16 DIAGNOSIS — R600 Localized edema: Secondary | ICD-10-CM

## 2018-09-16 NOTE — Progress Notes (Signed)
[name] is a 85 y.o. female with the following history as recorded in EpicCare:  Patient Active Problem List   Diagnosis Date Noted  . Prediabetes 09/09/2016  . CKD (chronic kidney disease) stage 3, GFR 30-59 ml/min (HCC) 09/10/2015  . Arthritis of right hip, severe 09/10/2015  . Pacemaker-St.Jude 01/13/2012  . Second degree Mobitz II AV block 01/11/2012  . CAROTID STENOSIS 11/29/2008  . Hearing loss 10/12/2008  . Osteoporosis 05/08/2008  . AORTIC VALVE REPLACEMENT, HX OF 05/08/2008  . Hyperlipidemia 04/13/2007  . Mild dementia ([place]) 04/13/2007  . Coronary atherosclerosis 04/13/2007  . Paroxysmal atrial fibrillation ([place]) 04/13/2007  . DIVERTICULOSIS OF COLON 04/13/2007  . Anxiety state 01/18/2007  . Essential hypertension 01/18/2007    Current Outpatient Medications  Medication Sig Dispense Refill  . acetaminophen (TYLENOL) 325 MG tablet Take 650 mg by mouth every 6 (six) hours as needed (pain).     Marland Kitchen alendronate (FOSAMAX) 70 MG tablet TAKE 1 TABLET BY MOUTH EVERY 7 DAYS, TAKE WITH A FULL GLASS OF WATER ON AN EMPTY STOMACH 12 tablet 1  . amLODipine (NORVASC) 5 MG tablet Take 1 tablet by mouth once daily 90 tablet 0  . aspirin EC 81 MG tablet Take 1 tablet (81 mg total) by mouth daily. 90 tablet 3  . cholecalciferol (VITAMIN D) 1000 UNITS tablet Take 1,000 Units by mouth daily with breakfast.     . citalopram (CELEXA) 20 MG tablet TAKE 1 TABLET BY MOUTH ONCE DAILY AT  6PM 90 tablet 0  . folic acid (FOLVITE) 1 MG tablet TAKE 1 TABLET BY MOUTH ONCE DAILY 90 tablet 1  . furosemide (LASIX) 40 MG tablet TAKE 2 TABLETS BY MOUTH ONCE DAILY 180 tablet 1  . Lidocaine HCl (LIDOCAINE PLUS) 4 % CREA Apply 1 application topically 3 (three) times daily.    Marland Kitchen nystatin (MYCOSTATIN/NYSTOP) powder Apply topically 4 (four) times daily. 60 g 3  . pravastatin (PRAVACHOL) 40 MG tablet Take 1 tablet (40 mg total) by mouth at bedtime. 90 tablet 1  . risperiDONE (RISPERDAL) 0.25 MG tablet Take 1 tablet  (0.25 mg total) by mouth at bedtime. 90 tablet 1   No current facility-administered medications for this visit.     Allergies: Coumadin [warfarin sodium], Clarithromycin, Iohexol, Nsaids, Penicillins, Prednisone, Statins, and Sulfonamide derivatives  Past Medical History:  Diagnosis Date  . Anemia, unspecified   . Anxiety state, unspecified   . Atrial fibrillation (HCC)    not anticoagulation candidate due to falls  . CAD (coronary artery disease)    s/p CABG  . Carotid artery stenosis   . Complete heart block ([place]) 12/2011   s/p St.Jude dual chamber PPM 01/12/12  . Dementia ([place])   . Dementia ([place]) 05/09/2017  . Diverticulosis of colon (without mention of hemorrhage)   . Esophageal reflux   . HTN (hypertension)   . Hyperlipidemia   . Osteoarthrosis, unspecified whether generalized or localized, unspecified site   . Osteoporosis, unspecified   . Pacemaker-St.Jude 01/13/2012  . S/P AVR (aortic valve replacement) 2003   bioprosthetic   . Thoracic aortic aneurysm ([place]) 2003   s/p repair  . Unspecified chronic bronchitis ([place])     Past Surgical History:  Procedure Laterality Date  . AORTIC VALVE REPLACEMENT  2003  . CATARACT EXTRACTION     bilateral  . CORONARY ARTERY BYPASS GRAFT    . INTRAOCULAR LENS INSERTION     bilateral  . PACEMAKER INSERTION  01/12/12   SJM Accent DR RF  implanted by Dr Rayann Heman for CHB  . PERMANENT PACEMAKER INSERTION N/A 01/12/2012   Procedure: PERMANENT PACEMAKER INSERTION;  Surgeon: Thompson Grayer, MD;  Location: [facility] CATH LAB;  Service: Cardiovascular;  Laterality: N/A;  . [place]   left  . VESICOVAGINAL FISTULA CLOSURE W/ TAH      Family History  Problem Relation Age of Onset  . Diabetes Brother   . Diabetes Sister     Social History   Tobacco Use  . Smoking status: Never Smoker  . Smokeless tobacco: Never Used  Substance Use Topics  . Alcohol use: No    Subjective:    I connected with Hayden R Punch on 09/16/18 at  11:00 AM EDT by a video enabled telemedicine application and verified that I am speaking with the correct person using two identifiers. Patient, her son and daughter in law are on the video call. Patient's son provides the history.   I discussed the limitations of evaluation and management by telemedicine and the availability of in person appointments. The patient expressed understanding and agreed to proceed.  2-3 month history of "light pink" noted after urinating; patient is incontinent; no fever; notes that in March she was treated for yeast infection and they wondered if she was still scratching herself causing herself to bleed;  Also notes that swelling in legs has been problematic x 2-3 months; already takes Lasix 80 mg daily; family member notes that patient is sedentary/ does not move much; they do not feel that she is wheezing; they feel she may be a little more short of breath recently but attributed this to her age.    Objective:  There were no vitals filed for this visit.  General: Well developed, well nourished, in no acute distress  Head: Normocephalic and atraumatic  Lungs: Respirations unlabored;  Neurologic: Alert and oriented; speech intact; face symmetrical; moves all extremities well; CNII-XII intact without focal deficit  Assessment:  1. Hematuria, unspecified type   2. Pedal edema     Plan:  Due to length of time these symptoms have been present and nature of the symptoms, virtual visit is not appropriate to treat her.  Feel that it would be best to actually bring patient into the office for in person exam; son agrees and is very comfortable with in office visit;  Offered to have them come get a urine cup today but decided this could be accomplished at upcoming visit instead;  See PCP next week;  No follow-ups on file.  Orders Placed This Encounter  Procedures  . Urine Culture    Standing Status:   Future    Standing Expiration Date:   09/16/2019  . Urinalysis     Standing Status:   Future    Standing Expiration Date:   09/16/2019    Requested Prescriptions    No prescriptions requested or ordered in this encounter

## 2018-09-20 DIAGNOSIS — R319 Hematuria, unspecified: Secondary | ICD-10-CM | POA: Insufficient documentation

## 2018-09-20 DIAGNOSIS — R600 Localized edema: Secondary | ICD-10-CM | POA: Insufficient documentation

## 2018-09-20 NOTE — Progress Notes (Signed)
Subjective:    Patient ID: [name], female    DOB: 01-Oct-1923, 85 y.o.   MRN: [id number]  HPI The patient is here for follow up.  She is here with her daughter-in-law.  Her daughter-in-law provides the history and the patient is not able to help with the history because of her dementia.  She had a doxy visit with Mickel Baas on 6/26 for hematuria and leg edema.  It was felt it would be better for her to be seen in person which is why she is here today.    Hematuria:  Her family noted 2-3 months of light pink or tea colored discoloration after urination.  It is intermittent.  She is incontinent at times, but also is able to go into the bathroom..  There have been no fevers.  Her daughter-in-law denies any changes in her behavior or dementia.  She was treated for a yeast infection a couple of months ago and was concerned she still had one resulting in scratching and possibly bleeding.  She was unsure if the blood was coming from external skin or in the urine.  Leg swelling:  Ongoing for 2-3 months.  She can not get her shoes on - today she is wearing her husband's shoes.  Taking lasix 80 mg daily. They were concerned she was a little more SOB.  She is very short of breath with any activity.  She is sedentary.  She will walk her house to the bathroom, but that is the only activity she does.  She does not complain of anything.  Hypertension: She is taking her medication daily. She is compliant with a low sodium diet. She does not monitor her blood pressure at home.     She gets meals on wheels.  She eats pretty healthy overall.  She has not been eating much because her husband has been sick and she is worried about him.     Medications and allergies reviewed with patient and updated if appropriate.  Patient Active Problem List   Diagnosis Date Noted  . Hematuria 09/20/2018  . Bilateral leg edema 09/20/2018  . Prediabetes 09/09/2016  . CKD (chronic kidney disease) stage 3, GFR 30-59 ml/min  (HCC) 09/10/2015  . Arthritis of right hip, severe 09/10/2015  . Pacemaker-St.Jude 01/13/2012  . Second degree Mobitz II AV block 01/11/2012  . CAROTID STENOSIS 11/29/2008  . Hearing loss 10/12/2008  . Osteoporosis 05/08/2008  . AORTIC VALVE REPLACEMENT, HX OF 05/08/2008  . Hyperlipidemia 04/13/2007  . Mild dementia ([place]) 04/13/2007  . Coronary atherosclerosis 04/13/2007  . Paroxysmal atrial fibrillation ([place]) 04/13/2007  . DIVERTICULOSIS OF COLON 04/13/2007  . Anxiety state 01/18/2007  . Essential hypertension 01/18/2007    Current Outpatient Medications on File Prior to Visit  Medication Sig Dispense Refill  . acetaminophen (TYLENOL) 325 MG tablet Take 650 mg by mouth every 6 (six) hours as needed (pain).     Marland Kitchen alendronate (FOSAMAX) 70 MG tablet TAKE 1 TABLET BY MOUTH EVERY 7 DAYS, TAKE WITH A FULL GLASS OF WATER ON AN EMPTY STOMACH 12 tablet 1  . amLODipine (NORVASC) 5 MG tablet Take 1 tablet by mouth once daily 90 tablet 0  . aspirin EC 81 MG tablet Take 1 tablet (81 mg total) by mouth daily. 90 tablet 3  . cholecalciferol (VITAMIN D) 1000 UNITS tablet Take 1,000 Units by mouth daily with breakfast.     . citalopram (CELEXA) 20 MG tablet TAKE 1 TABLET BY MOUTH ONCE DAILY AT  6PM 90 tablet 0  . folic acid (FOLVITE) 1 MG tablet TAKE 1 TABLET BY MOUTH ONCE DAILY 90 tablet 1  . furosemide (LASIX) 40 MG tablet TAKE 2 TABLETS BY MOUTH ONCE DAILY 180 tablet 1  . Lidocaine HCl (LIDOCAINE PLUS) 4 % CREA Apply 1 application topically 3 (three) times daily.    Marland Kitchen nystatin (MYCOSTATIN/NYSTOP) powder Apply topically 4 (four) times daily. 60 g 3  . pravastatin (PRAVACHOL) 40 MG tablet Take 1 tablet (40 mg total) by mouth at bedtime. 90 tablet 1  . risperiDONE (RISPERDAL) 0.25 MG tablet Take 1 tablet (0.25 mg total) by mouth at bedtime. 90 tablet 1   No current facility-administered medications on file prior to visit.     Past Medical History:  Diagnosis Date  . Anemia, unspecified   .  Anxiety state, unspecified   . Atrial fibrillation (HCC)    not anticoagulation candidate due to falls  . CAD (coronary artery disease)    s/p CABG  . Carotid artery stenosis   . Complete heart block ([place]) 12/2011   s/p St.Jude dual chamber PPM 01/12/12  . Dementia ([place])   . Dementia ([place]) 05/09/2017  . Diverticulosis of colon (without mention of hemorrhage)   . Esophageal reflux   . HTN (hypertension)   . Hyperlipidemia   . Osteoarthrosis, unspecified whether generalized or localized, unspecified site   . Osteoporosis, unspecified   . Pacemaker-St.Jude 01/13/2012  . S/P AVR (aortic valve replacement) 2003   bioprosthetic   . Thoracic aortic aneurysm ([place]) 2003   s/p repair  . Unspecified chronic bronchitis ([place])     Past Surgical History:  Procedure Laterality Date  . AORTIC VALVE REPLACEMENT  2003  . CATARACT EXTRACTION     bilateral  . CORONARY ARTERY BYPASS GRAFT    . INTRAOCULAR LENS INSERTION     bilateral  . PACEMAKER INSERTION  01/12/12   SJM Accent DR RF implanted by Dr Rayann Heman for CHB  . PERMANENT PACEMAKER INSERTION N/A 01/12/2012   Procedure: PERMANENT PACEMAKER INSERTION;  Surgeon: Thompson Grayer, MD;  Location: [facility] CATH LAB;  Service: Cardiovascular;  Laterality: N/A;  . [place]   left  . VESICOVAGINAL FISTULA CLOSURE W/ TAH      Social History   Socioeconomic History  . Marital status: Married    Spouse name: [name]  . Number of children: Not on file  . Years of education: Not on file  . Highest education level: Not on file  Occupational History  . Occupation: retired  Scientific laboratory technician  . Financial resource strain: Not on file  . Food insecurity    Worry: Not on file    Inability: Not on file  . Transportation needs    Medical: Not on file    Non-medical: Not on file  Tobacco Use  . Smoking status: Never Smoker  . Smokeless tobacco: Never Used  Substance and Sexual Activity  . Alcohol use: No  . Drug use: No  . Sexual  activity: Not on file  Lifestyle  . Physical activity    Days per week: Not on file    Minutes per session: Not on file  . Stress: Not on file  Relationships  . Social Herbalist on phone: Not on file    Gets together: Not on file    Attends religious service: Not on file    Active member of club or organization: Not on file    Attends meetings  of clubs or organizations: Not on file    Relationship status: Not on file  Other Topics Concern  . Not on file  Social History Narrative   Lives with husband. Mother had kidney problems.  Father had coronary artery  disease.  Both died when the patient was young.  She does not know how old  they were.  She has one brother who died at age 81 of a CVA, although, he  had coronary artery disease and diabetes.  Another brother died at age 88 of  a stroke.  He also had coronary artery disease.  One sister is living, she  has coronary artery disease and diabetes and one sister has anginal symptoms. Her sister has a pacemaker (history palpitations).           Family History  Problem Relation Age of Onset  . Diabetes Brother   . Diabetes Sister     Review of Systems  Unable to perform ROS: Dementia       Objective:   Vitals:   09/21/18 1259  BP: 118/64  Pulse: 63  Resp: 16  Temp: 98.2 F (36.8 C)  SpO2: 98%   BP Readings from Last 3 Encounters:  09/21/18 118/64  06/08/18 132/76  03/21/18 110/68   Wt Readings from Last 3 Encounters:  06/08/18 175 lb (79.4 kg)  03/21/18 170 lb 6.4 oz (77.3 kg)  01/13/18 175 lb 3.2 oz (79.5 kg)   Body mass index is 32.01 kg/m.   Physical Exam    Constitutional: Appears well-developed and well-nourished. No distress.  HENT:  Head: Normocephalic and atraumatic.  Neck: Neck supple. No tracheal deviation present. No thyromegaly present.  No cervical lymphadenopathy Cardiovascular: Normal rate, regular rhythm and normal heart sounds.   3/6 systolic murmur heard. No carotid bruit .  2+  pitting b/l edema Pulmonary/Chest: Effort normal and breath sounds normal. No respiratory distress. No has no wheezes. No rales. Abdomen: Soft, nontender, nondistended GU: We did stand her up and I was able to look at her external genitalia-no obvious redness, genital discharge, cuts or scratching marks-exam was very limited, but overall everything appeared normal Skin: Skin is warm and dry. Not diaphoretic.  Psychiatric: Normal mood and affect. Behavior is normal.     Assessment & Plan:    See Problem List for Assessment and Plan of chronic medical problems.

## 2018-09-21 ENCOUNTER — Other Ambulatory Visit (INDEPENDENT_AMBULATORY_CARE_PROVIDER_SITE_OTHER): Payer: Medicare Other

## 2018-09-21 ENCOUNTER — Ambulatory Visit (INDEPENDENT_AMBULATORY_CARE_PROVIDER_SITE_OTHER): Payer: Medicare Other | Admitting: Internal Medicine

## 2018-09-21 ENCOUNTER — Encounter: Payer: Self-pay | Admitting: Internal Medicine

## 2018-09-21 ENCOUNTER — Other Ambulatory Visit: Payer: Self-pay

## 2018-09-21 DIAGNOSIS — I10 Essential (primary) hypertension: Secondary | ICD-10-CM | POA: Diagnosis not present

## 2018-09-21 DIAGNOSIS — R600 Localized edema: Secondary | ICD-10-CM | POA: Diagnosis not present

## 2018-09-21 DIAGNOSIS — R319 Hematuria, unspecified: Secondary | ICD-10-CM

## 2018-09-21 DIAGNOSIS — I2510 Atherosclerotic heart disease of native coronary artery without angina pectoris: Secondary | ICD-10-CM

## 2018-09-21 LAB — URINALYSIS, ROUTINE W REFLEX MICROSCOPIC
Bilirubin Urine: NEGATIVE
Hgb urine dipstick: NEGATIVE
Ketones, ur: NEGATIVE
Leukocytes,Ua: NEGATIVE
Nitrite: POSITIVE — AB
RBC / HPF: NONE SEEN (ref 0–?)
Specific Gravity, Urine: 1.02 (ref 1.000–1.030)
Total Protein, Urine: NEGATIVE
Urine Glucose: NEGATIVE
Urobilinogen, UA: 0.2 (ref 0.0–1.0)
pH: 5 (ref 5.0–8.0)

## 2018-09-21 NOTE — Patient Instructions (Addendum)
Stop the amlodipine for now.   Check her BP on occasion to make sure it is not too high.    Try to increase her fluids.    We will check a urine to see if there is an infection.

## 2018-09-21 NOTE — Assessment & Plan Note (Signed)
Discoloration of pink in underwear or when she wipes ? From urine or surrounding skin - genitalia appears normal Will check UA, UCx if she can give a sample No change in mentation so may not need to be treated - will see result of urine

## 2018-09-21 NOTE — Assessment & Plan Note (Signed)
BP very good - will try d/c'ing amlodipine to see if that improves edema Family will check her BP

## 2018-09-21 NOTE — Assessment & Plan Note (Signed)
2+ b/l LE edema - unable to get shoes on ? Amlodipine contributing - given good BP will try to d/c and see if her BP is ok without it and hopefully edema will improve so we can avoid increasing her lasix -- lungs are clear so will hold off on adjustment of lasix for now

## 2018-09-23 ENCOUNTER — Other Ambulatory Visit: Payer: Self-pay | Admitting: Internal Medicine

## 2018-09-23 LAB — URINE CULTURE
MICRO NUMBER:: 626020
SPECIMEN QUALITY:: ADEQUATE

## 2018-09-23 NOTE — Telephone Encounter (Signed)
Urine does show an infection - lets go ahead and treat with an antibiotic since there is an obvious infection.     Antibiotic pending

## 2018-09-26 MED ORDER — CEPHALEXIN 500 MG PO CAPS: 500.0000 mg | ORAL_CAPSULE | Freq: Two times a day (BID) | ORAL | 0 refills | Status: DC

## 2018-09-26 NOTE — Telephone Encounter (Signed)
Patient's daughter in law informed of below. Rx sent to pharmacy per her request.

## 2018-09-27 ENCOUNTER — Ambulatory Visit (INDEPENDENT_AMBULATORY_CARE_PROVIDER_SITE_OTHER): Payer: Medicare Other | Admitting: *Deleted

## 2018-09-27 ENCOUNTER — Telehealth: Payer: Self-pay | Admitting: Internal Medicine

## 2018-09-27 DIAGNOSIS — I442 Atrioventricular block, complete: Secondary | ICD-10-CM

## 2018-09-27 NOTE — Telephone Encounter (Signed)
Needs nurse visit and can have it done in the car. Can not do at home.

## 2018-09-27 NOTE — Telephone Encounter (Signed)
Pt son called and stated that patient needs a TB test so that she can go to [facility]. Pt would like someone to come to the house for patient. Also son requested that we chck status of FL2. Please advise

## 2018-09-28 LAB — CUP PACEART REMOTE DEVICE CHECK
Battery Remaining Longevity: 61 mo
Battery Remaining Percentage: 46 %
Battery Voltage: 2.86 V
Brady Statistic AP VP Percent: 1.2 %
Brady Statistic AP VS Percent: 11 %
Brady Statistic AS VP Percent: 24 %
Brady Statistic AS VS Percent: 64 %
Brady Statistic RA Percent Paced: 11 %
Brady Statistic RV Percent Paced: 26 %
Date Time Interrogation Session: 20200708082014
Implantable Lead Implant Date: 20131022
Implantable Lead Implant Date: 20131022
Implantable Lead Location: 753859
Implantable Lead Location: 753860
Implantable Lead Model: 1944
Implantable Lead Model: 1948
Implantable Pulse Generator Implant Date: 20131022
Lead Channel Impedance Value: 450 Ohm
Lead Channel Impedance Value: 590 Ohm
Lead Channel Pacing Threshold Amplitude: 0.5 V
Lead Channel Pacing Threshold Amplitude: 0.625 V
Lead Channel Pacing Threshold Pulse Width: 0.4 ms
Lead Channel Pacing Threshold Pulse Width: 0.4 ms
Lead Channel Sensing Intrinsic Amplitude: 5 mV
Lead Channel Sensing Intrinsic Amplitude: 7.4 mV
Lead Channel Setting Pacing Amplitude: 0.875
Lead Channel Setting Pacing Amplitude: 1.5 V
Lead Channel Setting Pacing Pulse Width: 0.4 ms
Lead Channel Setting Sensing Sensitivity: 2 mV
Pulse Gen Model: 2210
Pulse Gen Serial Number: 7393244

## 2018-09-28 NOTE — Telephone Encounter (Signed)
Scheduled appointment for Friday to get TB test done.

## 2018-09-28 NOTE — Telephone Encounter (Signed)
The patient wants someone to come to her house to do this. Is there any homehealth that will do that

## 2018-09-30 ENCOUNTER — Other Ambulatory Visit: Payer: Self-pay

## 2018-09-30 ENCOUNTER — Ambulatory Visit (INDEPENDENT_AMBULATORY_CARE_PROVIDER_SITE_OTHER): Payer: Medicare Other

## 2018-09-30 DIAGNOSIS — Z111 Encounter for screening for respiratory tuberculosis: Secondary | ICD-10-CM | POA: Diagnosis not present

## 2018-10-03 ENCOUNTER — Telehealth: Payer: Self-pay | Admitting: Internal Medicine

## 2018-10-03 ENCOUNTER — Other Ambulatory Visit: Payer: Self-pay | Admitting: Internal Medicine

## 2018-10-03 DIAGNOSIS — Z111 Encounter for screening for respiratory tuberculosis: Secondary | ICD-10-CM

## 2018-10-03 LAB — TB SKIN TEST
Induration: 0 mm
TB Skin Test: NEGATIVE

## 2018-10-03 NOTE — Telephone Encounter (Signed)
Alana called back and stated DOB needs to be fixed , Wrong birthday is on the form , it also needs to say other: assisted living and then meds needs to be on the [facility] and not attached

## 2018-10-03 NOTE — Telephone Encounter (Signed)
Alana at [facility] assisted living is calling the mediations on the [facility] form say see attached. Due to state requirement the medications must be listed on the FL2.   Can this be documented and refaxed? Fax- [phone]

## 2018-10-04 ENCOUNTER — Telehealth: Payer: Self-pay

## 2018-10-04 NOTE — Telephone Encounter (Signed)
All changes have been made to [facility] form and has been refaxed. Called son to let him know. He will call [facility] to check and see if they received the updated form. Will call back if they did not.

## 2018-10-04 NOTE — Telephone Encounter (Signed)
Forms corrected and meds written on form as requested, refaxed to facility, copy of fax at Rayelynn Loyal's desk

## 2018-10-04 NOTE — Telephone Encounter (Signed)
Copied from [place] [phone]. Topic: Quick Communication - See Telephone Encounter >> Oct 04, 2018 10:47 AM Loma [place] wrote: CRM for notification. See Telephone encounter for: 10/04/18. Pt sonRonald Belding has urgent request for FL@ Form to be sent to [place] One was sent but it was including medicine. He needs Stacy's nurse to call him so he can explain exactly what they need or she will not be able to do transfer today. They have to have done today because his dad is coming home out of care today and there is not a place to care for both of them. Please FU with him about specfic so that incorrect FL@ Form will not cancel out mom's admittance today! [phone]) [phone] Jori Moll for  Dr Quay Burow Nurse

## 2018-10-07 ENCOUNTER — Telehealth: Payer: Self-pay | Admitting: Internal Medicine

## 2018-10-07 NOTE — Telephone Encounter (Signed)
New Message    Patient wants to know how to tell if monitor is wired or wifi capable.  Due to needing a wifi one where she's moving.

## 2018-10-07 NOTE — Telephone Encounter (Signed)
I let the pt daughter in law know that the pt monitor communication type is cellular. I gave her the number to [place] support to verify it is indeed wifi capable.

## 2018-10-08 ENCOUNTER — Encounter: Payer: Self-pay | Admitting: Cardiology

## 2018-10-08 NOTE — Progress Notes (Signed)
Remote pacemaker transmission.   

## 2018-10-10 ENCOUNTER — Telehealth: Payer: Self-pay | Admitting: Internal Medicine

## 2018-10-10 NOTE — Telephone Encounter (Signed)
Copied from [place] [phone]. Topic: Quick Communication - Rx Refill/Question >> Oct 10, 2018  2:00 PM [place], Marland Kitchen wrote: Medication: something or anxiety  Has the patient contacted their pharmacy? no (Agent: If no, request that the patient contact the pharmacy for the refill.) (Agent: If yes, when and what did the pharmacy advise?)  Preferred Pharmacy (with phone number or street name): [facility] Pt's husband passed away today.  Pt is very upset, crying, anxious.   Son is asking if something can be called in to help her.    Agent: Please be advised that RX refills may take up to 3 business days. We ask that you follow-up with your pharmacy.

## 2018-10-10 NOTE — Telephone Encounter (Signed)
She is already on risperidone that she takes at night.  We can try this twice-three times a day to see if that helps.  This may make her a little drowsy so they should keep a close eye on her.  Have him call back in a few days to let us know if this is helping or not.

## 2018-10-11 NOTE — Telephone Encounter (Signed)
Noted  

## 2018-10-11 NOTE — Telephone Encounter (Signed)
LVM for son to call back to discuss below.

## 2018-10-11 NOTE — Telephone Encounter (Signed)
Gave pt son info, he is going give the Risperidone as needed.  He stated that she may not need 3 so he said he will probably not give her any more then 2 aday.

## 2018-11-15 ENCOUNTER — Other Ambulatory Visit: Payer: Self-pay | Admitting: Internal Medicine

## 2018-11-16 ENCOUNTER — Other Ambulatory Visit: Payer: Self-pay

## 2018-11-16 MED ORDER — FOLIC ACID 1 MG PO TABS: 1.0000 mg | ORAL_TABLET | Freq: Every day | ORAL | 0 refills | Status: DC

## 2018-11-18 ENCOUNTER — Other Ambulatory Visit: Payer: Self-pay | Admitting: Internal Medicine

## 2018-11-29 DIAGNOSIS — Z0279 Encounter for issue of other medical certificate: Secondary | ICD-10-CM

## 2018-11-30 ENCOUNTER — Telehealth: Payer: Self-pay

## 2018-11-30 NOTE — Telephone Encounter (Signed)
FL2 updated and refaxed

## 2018-11-30 NOTE — Telephone Encounter (Signed)
Copied from [place] [id number]. Topic: General - Inquiry >> Nov 30, 2018  9:01 AM Scherrie Gerlach wrote: Reason for CRM: Alona with [facility] home health state they faxde the [facility] back yesterday  'they need to know how often to administer her meds and  the dosage  On  4th page of addendum if pt had a covid test and if ok for them to give if she did not  Cb [phone]

## 2018-12-08 ENCOUNTER — Telehealth: Payer: Self-pay

## 2018-12-08 DIAGNOSIS — Z20828 Contact with and (suspected) exposure to other viral communicable diseases: Secondary | ICD-10-CM | POA: Diagnosis not present

## 2018-12-08 NOTE — Telephone Encounter (Signed)
Copied from [place] [phone]. Topic: General - Call Back - No Documentation >> Dec 08, 2018  3:50 PM Erick Blinks wrote: Reason for CRM:  Best contact: [phone] Garnette Scheuermann, nurse at assisted living, called to report that pt has redness and bruising on flank. They are requesting topical cream to resolve.

## 2018-12-08 NOTE — Telephone Encounter (Signed)
?   Cause   Bruising sounds like she may have bumped the area Is redness from that or does it look like a yeast infection?  Itchy?

## 2018-12-09 MED ORDER — PETROLATUM-ZINC OXIDE 49-15 % EX OINT: TOPICAL_OINTMENT | CUTANEOUS | 3 refills | Status: DC

## 2018-12-09 NOTE — Telephone Encounter (Signed)
No itching. The lady I spoke with today states it is on her buttock area. Not sure where flank came from. Also she thinks it is from urinating more often. Wanted to know if some type of barrier cream could be faxed over at [phone]. She states on the rx it can have noted DC'd when healed.

## 2018-12-09 NOTE — Telephone Encounter (Signed)
Cream rx printed

## 2018-12-09 NOTE — Telephone Encounter (Signed)
Rx faxed

## 2018-12-12 ENCOUNTER — Ambulatory Visit: Payer: Medicare Other | Admitting: Internal Medicine

## 2018-12-15 DIAGNOSIS — Z79899 Other long term (current) drug therapy: Secondary | ICD-10-CM | POA: Diagnosis not present

## 2018-12-15 DIAGNOSIS — I10 Essential (primary) hypertension: Secondary | ICD-10-CM | POA: Diagnosis not present

## 2018-12-15 DIAGNOSIS — M810 Age-related osteoporosis without current pathological fracture: Secondary | ICD-10-CM | POA: Diagnosis not present

## 2018-12-15 DIAGNOSIS — I4891 Unspecified atrial fibrillation: Secondary | ICD-10-CM | POA: Diagnosis not present

## 2018-12-15 DIAGNOSIS — E782 Mixed hyperlipidemia: Secondary | ICD-10-CM | POA: Diagnosis not present

## 2018-12-20 DIAGNOSIS — G301 Alzheimer's disease with late onset: Secondary | ICD-10-CM | POA: Diagnosis not present

## 2018-12-20 DIAGNOSIS — F331 Major depressive disorder, recurrent, moderate: Secondary | ICD-10-CM | POA: Diagnosis not present

## 2018-12-21 DIAGNOSIS — F331 Major depressive disorder, recurrent, moderate: Secondary | ICD-10-CM | POA: Diagnosis not present

## 2018-12-21 DIAGNOSIS — F0281 Dementia in other diseases classified elsewhere with behavioral disturbance: Secondary | ICD-10-CM | POA: Diagnosis not present

## 2018-12-21 DIAGNOSIS — F062 Psychotic disorder with delusions due to known physiological condition: Secondary | ICD-10-CM | POA: Diagnosis not present

## 2018-12-21 DIAGNOSIS — G301 Alzheimer's disease with late onset: Secondary | ICD-10-CM | POA: Diagnosis not present

## 2018-12-27 ENCOUNTER — Ambulatory Visit (INDEPENDENT_AMBULATORY_CARE_PROVIDER_SITE_OTHER): Payer: Medicare Other | Admitting: *Deleted

## 2018-12-27 DIAGNOSIS — I441 Atrioventricular block, second degree: Secondary | ICD-10-CM

## 2018-12-27 DIAGNOSIS — I480 Paroxysmal atrial fibrillation: Secondary | ICD-10-CM

## 2018-12-28 LAB — CUP PACEART REMOTE DEVICE CHECK
Battery Remaining Longevity: 61 mo
Battery Remaining Percentage: 46 %
Battery Voltage: 2.86 V
Brady Statistic AP VP Percent: 1.5 %
Brady Statistic AP VS Percent: 7.9 %
Brady Statistic AS VP Percent: 37 %
Brady Statistic AS VS Percent: 53 %
Brady Statistic RA Percent Paced: 8.3 %
Brady Statistic RV Percent Paced: 39 %
Date Time Interrogation Session: 20201006092542
Implantable Lead Implant Date: 20131022
Implantable Lead Implant Date: 20131022
Implantable Lead Location: 753859
Implantable Lead Location: 753860
Implantable Lead Model: 1944
Implantable Lead Model: 1948
Implantable Pulse Generator Implant Date: 20131022
Lead Channel Impedance Value: 450 Ohm
Lead Channel Impedance Value: 580 Ohm
Lead Channel Pacing Threshold Amplitude: 0.5 V
Lead Channel Pacing Threshold Amplitude: 0.75 V
Lead Channel Pacing Threshold Pulse Width: 0.4 ms
Lead Channel Pacing Threshold Pulse Width: 0.4 ms
Lead Channel Sensing Intrinsic Amplitude: 5 mV
Lead Channel Sensing Intrinsic Amplitude: 7 mV
Lead Channel Setting Pacing Amplitude: 1 V
Lead Channel Setting Pacing Amplitude: 1.5 V
Lead Channel Setting Pacing Pulse Width: 0.4 ms
Lead Channel Setting Sensing Sensitivity: 2 mV
Pulse Gen Model: 2210
Pulse Gen Serial Number: 7393244

## 2018-12-29 DIAGNOSIS — F0281 Dementia in other diseases classified elsewhere with behavioral disturbance: Secondary | ICD-10-CM | POA: Diagnosis not present

## 2018-12-29 DIAGNOSIS — G301 Alzheimer's disease with late onset: Secondary | ICD-10-CM | POA: Diagnosis not present

## 2019-01-04 NOTE — Progress Notes (Signed)
Remote pacemaker transmission.   

## 2019-01-05 DIAGNOSIS — M810 Age-related osteoporosis without current pathological fracture: Secondary | ICD-10-CM | POA: Diagnosis not present

## 2019-01-05 DIAGNOSIS — E782 Mixed hyperlipidemia: Secondary | ICD-10-CM | POA: Diagnosis not present

## 2019-01-05 DIAGNOSIS — I4891 Unspecified atrial fibrillation: Secondary | ICD-10-CM | POA: Diagnosis not present

## 2019-01-05 DIAGNOSIS — Z79899 Other long term (current) drug therapy: Secondary | ICD-10-CM | POA: Diagnosis not present

## 2019-01-05 DIAGNOSIS — I10 Essential (primary) hypertension: Secondary | ICD-10-CM | POA: Diagnosis not present

## 2019-01-20 DIAGNOSIS — F331 Major depressive disorder, recurrent, moderate: Secondary | ICD-10-CM | POA: Diagnosis not present

## 2019-01-20 DIAGNOSIS — G301 Alzheimer's disease with late onset: Secondary | ICD-10-CM | POA: Diagnosis not present

## 2019-01-20 DIAGNOSIS — F0281 Dementia in other diseases classified elsewhere with behavioral disturbance: Secondary | ICD-10-CM | POA: Diagnosis not present

## 2019-01-20 DIAGNOSIS — F062 Psychotic disorder with delusions due to known physiological condition: Secondary | ICD-10-CM | POA: Diagnosis not present

## 2019-02-02 DIAGNOSIS — I10 Essential (primary) hypertension: Secondary | ICD-10-CM | POA: Diagnosis not present

## 2019-02-02 DIAGNOSIS — Z7982 Long term (current) use of aspirin: Secondary | ICD-10-CM | POA: Diagnosis not present

## 2019-02-02 DIAGNOSIS — I4891 Unspecified atrial fibrillation: Secondary | ICD-10-CM | POA: Diagnosis not present

## 2019-02-08 DIAGNOSIS — L84 Corns and callosities: Secondary | ICD-10-CM | POA: Diagnosis not present

## 2019-02-08 DIAGNOSIS — B351 Tinea unguium: Secondary | ICD-10-CM | POA: Diagnosis not present

## 2019-02-08 DIAGNOSIS — I739 Peripheral vascular disease, unspecified: Secondary | ICD-10-CM | POA: Diagnosis not present

## 2019-02-09 DIAGNOSIS — Z7982 Long term (current) use of aspirin: Secondary | ICD-10-CM | POA: Diagnosis not present

## 2019-02-09 DIAGNOSIS — M810 Age-related osteoporosis without current pathological fracture: Secondary | ICD-10-CM | POA: Diagnosis not present

## 2019-02-09 DIAGNOSIS — I4891 Unspecified atrial fibrillation: Secondary | ICD-10-CM | POA: Diagnosis not present

## 2019-02-09 DIAGNOSIS — I739 Peripheral vascular disease, unspecified: Secondary | ICD-10-CM | POA: Diagnosis not present

## 2019-02-09 DIAGNOSIS — I10 Essential (primary) hypertension: Secondary | ICD-10-CM | POA: Diagnosis not present

## 2019-02-10 DIAGNOSIS — F331 Major depressive disorder, recurrent, moderate: Secondary | ICD-10-CM | POA: Diagnosis not present

## 2019-02-10 DIAGNOSIS — F062 Psychotic disorder with delusions due to known physiological condition: Secondary | ICD-10-CM | POA: Diagnosis not present

## 2019-02-10 DIAGNOSIS — G301 Alzheimer's disease with late onset: Secondary | ICD-10-CM | POA: Diagnosis not present

## 2019-02-10 DIAGNOSIS — F0281 Dementia in other diseases classified elsewhere with behavioral disturbance: Secondary | ICD-10-CM | POA: Diagnosis not present

## 2019-02-13 ENCOUNTER — Other Ambulatory Visit: Payer: Self-pay

## 2019-02-15 ENCOUNTER — Telehealth: Payer: Self-pay | Admitting: Cardiology

## 2019-02-15 NOTE — Telephone Encounter (Signed)
Patient has been set up for a virtual appt with Coletta Memos on 03/27/18. Delcie Roch states that they do their telehealth visits via email. Just making sure this virtual appt can be completed through email.

## 2019-02-15 NOTE — Telephone Encounter (Signed)
Not sure how Denyse Amass does his virtual visits- if he is able to use E-MAIL- will route to verify. Thanks!

## 2019-02-17 ENCOUNTER — Telehealth: Payer: Self-pay | Admitting: Internal Medicine

## 2019-02-17 DIAGNOSIS — E785 Hyperlipidemia, unspecified: Secondary | ICD-10-CM | POA: Diagnosis not present

## 2019-02-17 DIAGNOSIS — Z9181 History of falling: Secondary | ICD-10-CM | POA: Diagnosis not present

## 2019-02-17 DIAGNOSIS — M810 Age-related osteoporosis without current pathological fracture: Secondary | ICD-10-CM | POA: Diagnosis not present

## 2019-02-17 DIAGNOSIS — R7303 Prediabetes: Secondary | ICD-10-CM | POA: Diagnosis not present

## 2019-02-17 DIAGNOSIS — F0390 Unspecified dementia without behavioral disturbance: Secondary | ICD-10-CM | POA: Diagnosis not present

## 2019-02-17 DIAGNOSIS — Z96642 Presence of left artificial hip joint: Secondary | ICD-10-CM | POA: Diagnosis not present

## 2019-02-17 DIAGNOSIS — M1611 Unilateral primary osteoarthritis, right hip: Secondary | ICD-10-CM | POA: Diagnosis not present

## 2019-02-17 DIAGNOSIS — N183 Chronic kidney disease, stage 3 unspecified: Secondary | ICD-10-CM | POA: Diagnosis not present

## 2019-02-17 DIAGNOSIS — I4430 Unspecified atrioventricular block: Secondary | ICD-10-CM | POA: Diagnosis not present

## 2019-02-17 DIAGNOSIS — Z7982 Long term (current) use of aspirin: Secondary | ICD-10-CM | POA: Diagnosis not present

## 2019-02-17 DIAGNOSIS — I129 Hypertensive chronic kidney disease with stage 1 through stage 4 chronic kidney disease, or unspecified chronic kidney disease: Secondary | ICD-10-CM | POA: Diagnosis not present

## 2019-02-17 DIAGNOSIS — Z950 Presence of cardiac pacemaker: Secondary | ICD-10-CM | POA: Diagnosis not present

## 2019-02-17 DIAGNOSIS — Z952 Presence of prosthetic heart valve: Secondary | ICD-10-CM | POA: Diagnosis not present

## 2019-02-17 DIAGNOSIS — F419 Anxiety disorder, unspecified: Secondary | ICD-10-CM | POA: Diagnosis not present

## 2019-02-17 DIAGNOSIS — I4891 Unspecified atrial fibrillation: Secondary | ICD-10-CM | POA: Diagnosis not present

## 2019-02-17 DIAGNOSIS — K5790 Diverticulosis of intestine, part unspecified, without perforation or abscess without bleeding: Secondary | ICD-10-CM | POA: Diagnosis not present

## 2019-02-17 DIAGNOSIS — Z951 Presence of aortocoronary bypass graft: Secondary | ICD-10-CM | POA: Diagnosis not present

## 2019-02-17 DIAGNOSIS — R4701 Aphasia: Secondary | ICD-10-CM | POA: Diagnosis not present

## 2019-02-17 DIAGNOSIS — I739 Peripheral vascular disease, unspecified: Secondary | ICD-10-CM | POA: Diagnosis not present

## 2019-02-17 NOTE — Telephone Encounter (Signed)
Caller/Agency: Krystal Clark Callback Number: [phone] ok to leave verbal on VM Requesting OT/PT/Skilled Nursing/Social Work/Speech Therapy: PT Frequency: Start of services. Please advise

## 2019-02-20 NOTE — Telephone Encounter (Signed)
Returned call to Delcie Roch she states that she was  Just wondering if we could do virtual visit via email

## 2019-02-20 NOTE — Telephone Encounter (Signed)
Gave ok for orders per Dr. Burns.  

## 2019-02-22 ENCOUNTER — Telehealth: Payer: Self-pay | Admitting: Internal Medicine

## 2019-02-22 NOTE — Telephone Encounter (Signed)
Is she taking the Tylenol on a regular basis or only as needed?  If she is not taking it regularly start 1000 mg of Tylenol 3 times daily.  This is not effective we should let us know.

## 2019-02-22 NOTE — Telephone Encounter (Signed)
Home Health Verbal Orders - Caller/Agency: Mendel Ryder with [facility] Number: [phone] Requesting OT/PT/Skilled Nursing/Social Work/Speech Therapy: she would like to know if there is something that pt could use for pain for her hip?  She is in memory care

## 2019-02-23 NOTE — Telephone Encounter (Signed)
Called and left message for Mendel Ryder today with instructions and to call us back if Tylenol wasn't helping.

## 2019-02-27 ENCOUNTER — Telehealth: Payer: Self-pay | Admitting: Emergency Medicine

## 2019-02-27 NOTE — Telephone Encounter (Signed)
Spoke with patient's son. patient is now inpatient at [facility] on [address] in the memory care center. Patient had 20 cycle episode of NSVT on 02/26/19 at 8:10 pm. Spoke with Alwyn Ren at facility and patient chart had no documentation of any issues on 12/6-12/7/20. Patient has been at her baseline activity today and has had no c/o problems.

## 2019-03-03 ENCOUNTER — Telehealth: Payer: Self-pay | Admitting: Internal Medicine

## 2019-03-03 NOTE — Telephone Encounter (Signed)
New Message   [name] is calling from Drs making house calls and [name] is the provider for the Pt. Loma Sousa is requesting copies of the pts heart monitor readings  Fax [phone]   Please call

## 2019-03-03 NOTE — Telephone Encounter (Signed)
Faxed sent.

## 2019-03-19 DIAGNOSIS — K5790 Diverticulosis of intestine, part unspecified, without perforation or abscess without bleeding: Secondary | ICD-10-CM | POA: Diagnosis not present

## 2019-03-19 DIAGNOSIS — I129 Hypertensive chronic kidney disease with stage 1 through stage 4 chronic kidney disease, or unspecified chronic kidney disease: Secondary | ICD-10-CM | POA: Diagnosis not present

## 2019-03-19 DIAGNOSIS — N183 Chronic kidney disease, stage 3 unspecified: Secondary | ICD-10-CM | POA: Diagnosis not present

## 2019-03-19 DIAGNOSIS — Z950 Presence of cardiac pacemaker: Secondary | ICD-10-CM | POA: Diagnosis not present

## 2019-03-19 DIAGNOSIS — Z952 Presence of prosthetic heart valve: Secondary | ICD-10-CM | POA: Diagnosis not present

## 2019-03-19 DIAGNOSIS — Z951 Presence of aortocoronary bypass graft: Secondary | ICD-10-CM | POA: Diagnosis not present

## 2019-03-19 DIAGNOSIS — I4430 Unspecified atrioventricular block: Secondary | ICD-10-CM | POA: Diagnosis not present

## 2019-03-19 DIAGNOSIS — R4701 Aphasia: Secondary | ICD-10-CM | POA: Diagnosis not present

## 2019-03-19 DIAGNOSIS — I4891 Unspecified atrial fibrillation: Secondary | ICD-10-CM | POA: Diagnosis not present

## 2019-03-19 DIAGNOSIS — F419 Anxiety disorder, unspecified: Secondary | ICD-10-CM | POA: Diagnosis not present

## 2019-03-19 DIAGNOSIS — Z96642 Presence of left artificial hip joint: Secondary | ICD-10-CM | POA: Diagnosis not present

## 2019-03-19 DIAGNOSIS — M1611 Unilateral primary osteoarthritis, right hip: Secondary | ICD-10-CM | POA: Diagnosis not present

## 2019-03-19 DIAGNOSIS — E785 Hyperlipidemia, unspecified: Secondary | ICD-10-CM | POA: Diagnosis not present

## 2019-03-19 DIAGNOSIS — Z7982 Long term (current) use of aspirin: Secondary | ICD-10-CM | POA: Diagnosis not present

## 2019-03-19 DIAGNOSIS — R7303 Prediabetes: Secondary | ICD-10-CM | POA: Diagnosis not present

## 2019-03-19 DIAGNOSIS — I739 Peripheral vascular disease, unspecified: Secondary | ICD-10-CM | POA: Diagnosis not present

## 2019-03-19 DIAGNOSIS — Z9181 History of falling: Secondary | ICD-10-CM | POA: Diagnosis not present

## 2019-03-19 DIAGNOSIS — F0390 Unspecified dementia without behavioral disturbance: Secondary | ICD-10-CM | POA: Diagnosis not present

## 2019-03-19 DIAGNOSIS — M810 Age-related osteoporosis without current pathological fracture: Secondary | ICD-10-CM | POA: Diagnosis not present

## 2019-03-20 DIAGNOSIS — M1611 Unilateral primary osteoarthritis, right hip: Secondary | ICD-10-CM | POA: Diagnosis not present

## 2019-03-20 DIAGNOSIS — F419 Anxiety disorder, unspecified: Secondary | ICD-10-CM | POA: Diagnosis not present

## 2019-03-20 DIAGNOSIS — F0390 Unspecified dementia without behavioral disturbance: Secondary | ICD-10-CM | POA: Diagnosis not present

## 2019-03-20 DIAGNOSIS — I739 Peripheral vascular disease, unspecified: Secondary | ICD-10-CM | POA: Diagnosis not present

## 2019-03-20 DIAGNOSIS — M810 Age-related osteoporosis without current pathological fracture: Secondary | ICD-10-CM | POA: Diagnosis not present

## 2019-03-20 DIAGNOSIS — I129 Hypertensive chronic kidney disease with stage 1 through stage 4 chronic kidney disease, or unspecified chronic kidney disease: Secondary | ICD-10-CM | POA: Diagnosis not present

## 2019-03-21 DIAGNOSIS — I129 Hypertensive chronic kidney disease with stage 1 through stage 4 chronic kidney disease, or unspecified chronic kidney disease: Secondary | ICD-10-CM | POA: Diagnosis not present

## 2019-03-21 DIAGNOSIS — M1611 Unilateral primary osteoarthritis, right hip: Secondary | ICD-10-CM | POA: Diagnosis not present

## 2019-03-21 DIAGNOSIS — M810 Age-related osteoporosis without current pathological fracture: Secondary | ICD-10-CM | POA: Diagnosis not present

## 2019-03-21 DIAGNOSIS — F419 Anxiety disorder, unspecified: Secondary | ICD-10-CM | POA: Diagnosis not present

## 2019-03-21 DIAGNOSIS — I739 Peripheral vascular disease, unspecified: Secondary | ICD-10-CM | POA: Diagnosis not present

## 2019-03-21 DIAGNOSIS — F0390 Unspecified dementia without behavioral disturbance: Secondary | ICD-10-CM | POA: Diagnosis not present

## 2019-03-22 DIAGNOSIS — M1611 Unilateral primary osteoarthritis, right hip: Secondary | ICD-10-CM | POA: Diagnosis not present

## 2019-03-22 DIAGNOSIS — I129 Hypertensive chronic kidney disease with stage 1 through stage 4 chronic kidney disease, or unspecified chronic kidney disease: Secondary | ICD-10-CM | POA: Diagnosis not present

## 2019-03-22 DIAGNOSIS — M810 Age-related osteoporosis without current pathological fracture: Secondary | ICD-10-CM | POA: Diagnosis not present

## 2019-03-22 DIAGNOSIS — I739 Peripheral vascular disease, unspecified: Secondary | ICD-10-CM | POA: Diagnosis not present

## 2019-03-22 DIAGNOSIS — F0390 Unspecified dementia without behavioral disturbance: Secondary | ICD-10-CM | POA: Diagnosis not present

## 2019-03-22 DIAGNOSIS — F419 Anxiety disorder, unspecified: Secondary | ICD-10-CM | POA: Diagnosis not present

## 2019-03-27 ENCOUNTER — Telehealth: Payer: Self-pay | Admitting: General Practice

## 2019-03-27 DIAGNOSIS — I4891 Unspecified atrial fibrillation: Secondary | ICD-10-CM | POA: Diagnosis not present

## 2019-03-27 DIAGNOSIS — I129 Hypertensive chronic kidney disease with stage 1 through stage 4 chronic kidney disease, or unspecified chronic kidney disease: Secondary | ICD-10-CM | POA: Diagnosis not present

## 2019-03-27 DIAGNOSIS — F419 Anxiety disorder, unspecified: Secondary | ICD-10-CM | POA: Diagnosis not present

## 2019-03-27 DIAGNOSIS — L97521 Non-pressure chronic ulcer of other part of left foot limited to breakdown of skin: Secondary | ICD-10-CM | POA: Diagnosis not present

## 2019-03-27 DIAGNOSIS — I739 Peripheral vascular disease, unspecified: Secondary | ICD-10-CM | POA: Diagnosis not present

## 2019-03-27 DIAGNOSIS — M810 Age-related osteoporosis without current pathological fracture: Secondary | ICD-10-CM | POA: Diagnosis not present

## 2019-03-27 DIAGNOSIS — F0390 Unspecified dementia without behavioral disturbance: Secondary | ICD-10-CM | POA: Diagnosis not present

## 2019-03-27 DIAGNOSIS — M1611 Unilateral primary osteoarthritis, right hip: Secondary | ICD-10-CM | POA: Diagnosis not present

## 2019-03-27 DIAGNOSIS — I10 Essential (primary) hypertension: Secondary | ICD-10-CM | POA: Diagnosis not present

## 2019-03-27 DIAGNOSIS — Z7982 Long term (current) use of aspirin: Secondary | ICD-10-CM | POA: Diagnosis not present

## 2019-03-27 NOTE — Telephone Encounter (Signed)
Patient's [name] requesting to come with the patient to her appointment 1/5. He states she has dementia and will need him to help her communicate. He also states he thought the appointment was virtual and would like to know if this appointment can be virtual. If it can be made virtual he would also like to know if he can be conferenced into the virtual appointment.

## 2019-03-27 NOTE — Telephone Encounter (Signed)
Spoke with pt's son and nurse Kenney Houseman )  at [facility] and pt's appt is actual office visit for tomorrow and facility does provide transportation Miranda changed visit to office visit and left message for son to call back to let him know .Adonis Housekeeper

## 2019-03-27 NOTE — Progress Notes (Signed)
Cardiology Clinic Note   Patient Name: [name] Date of Encounter: 03/28/2019  Primary Care Provider:  Binnie Rail, MD Primary Cardiologist:  Kirk Ruths, MD  Patient Profile    [name] 86 year old female presents today for follow-up of her coronary artery disease status post CABG, aortic aneurysm repair, aortic valve replacement, and PAF.  Past Medical History    Past Medical History:  Diagnosis Date  . Anemia, unspecified   . Anxiety state, unspecified   . Atrial fibrillation (HCC)    not anticoagulation candidate due to falls  . CAD (coronary artery disease)    s/p CABG  . Carotid artery stenosis   . Complete heart block ([place]) 12/2011   s/p St.Jude dual chamber PPM 01/12/12  . Dementia ([place])   . Dementia ([place]) 05/09/2017  . Diverticulosis of colon (without mention of hemorrhage)   . Esophageal reflux   . HTN (hypertension)   . Hyperlipidemia   . Osteoarthrosis, unspecified whether generalized or localized, unspecified site   . Osteoporosis, unspecified   . Pacemaker-St.Jude 01/13/2012  . S/P AVR (aortic valve replacement) 2003   bioprosthetic   . Thoracic aortic aneurysm ([place]) 2003   s/p repair  . Unspecified chronic bronchitis ([place])    Past Surgical History:  Procedure Laterality Date  . AORTIC VALVE REPLACEMENT  2003  . CATARACT EXTRACTION     bilateral  . CORONARY ARTERY BYPASS GRAFT    . INTRAOCULAR LENS INSERTION     bilateral  . PACEMAKER INSERTION  01/12/12   SJM Accent DR RF implanted by Dr Rayann Heman for CHB  . PERMANENT PACEMAKER INSERTION N/A 01/12/2012   Procedure: PERMANENT PACEMAKER INSERTION;  Surgeon: Thompson Grayer, MD;  Location: [facility] CATH LAB;  Service: Cardiovascular;  Laterality: N/A;  . [place]   left  . VESICOVAGINAL FISTULA CLOSURE W/ TAH      Allergies  Allergies  Allergen Reactions  . Coumadin [Warfarin Sodium] Other (See Comments)    hallucinations   . Clarithromycin Nausea Only and Other (See  Comments)  . Iohexol      Desc: sob with ivp approx 20 yrs ago, pt was premedicated with 13hr prep with no problems kdean, Onset Date: [id number]   . Nsaids Nausea Only  . Penicillins Other (See Comments)    Abdominal pain  . Prednisone Nausea Only and Swelling  . Statins Other (See Comments)    REACTION: abd pain and dark stools  . Sulfonamide Derivatives Nausea Only and Other (See Comments)    Abdominal pain     History of Present Illness    [name] has a past medical history of CAD status post CABG, complete heart block status post [place] dual-chamber PPM 2013, aortic valve replacement 2003,  thoracic aortic aneurysm status post repair 2003, and PAF.  She underwent stress test in 08/2006 which showed normal perfusion and an ejection fraction of 76%.  Her carotid Dopplers in 11/2009 showed normal carotids.  She has also been noted to have dementia with psychotic features.  Her echocardiogram from 06/2012 showed normal LV function, moderate LVH, bioprosthetic aortic valve with a mean gradient of 20 mmHg and mild left atrial enlargement.  She was last seen by Dr. Stanford Breed on 03/21/2018.  During that time she was noted to have some dyspnea, no chest pain, palpitations, lower extremity edema, or syncope.  She presents the clinic today with her son and he states she [place] [facility] care in August 2020.  Her husband passed away last year.  She has had 2 episodes where she did not feel well.  She also suffered a fall 1 month ago.  Overall he feels pleased with the care that she is receiving at [facility].  He feels she is more active, her weight is better controlled, and she is seen by provider once weekly.  I will check a BMP today and have her follow-up in 1 year.  She and her son deny chest pain, increased shortness of breath, lower extremity edema, fatigue, palpitations, melena, hematuria, hemoptysis, diaphoresis, weakness, presyncope, syncope, orthopnea, and PND.   Home Medications      Prior to Admission medications   Medication Sig Start Date End Date Taking? Authorizing Provider  acetaminophen (TYLENOL) 325 MG tablet Take 650 mg by mouth every 6 (six) hours as needed (pain).     [provider]  alendronate (FOSAMAX) 70 MG tablet TAKE 1 TABLET BY MOUTH EVERY 7 DAYS, TAKE WITH A FULL GLASS OF WATER ON AN EMPTY STOMACH 11/18/18   Binnie Rail, MD  aspirin EC 81 MG tablet Take 1 tablet (81 mg total) by mouth daily. 03/21/18   Lelon Perla, MD  cholecalciferol (VITAMIN D) 1000 UNITS tablet Take 1,000 Units by mouth daily with breakfast.     [provider]  citalopram (CELEXA) 20 MG tablet TAKE 1 TABLET BY MOUTH ONCE DAILY AT  6PM 08/17/18   Binnie Rail, MD  folic acid (FOLVITE) 1 MG tablet Take 1 tablet (1 mg total) by mouth daily. 11/16/18   Binnie Rail, MD  furosemide (LASIX) 40 MG tablet Take 2 tablets by mouth once daily 11/15/18   Binnie Rail, MD  Lidocaine HCl (LIDOCAINE PLUS) 4 % CREA Apply 1 application topically 3 (three) times daily.    [provider]  nystatin (MYCOSTATIN/NYSTOP) powder Apply topically 4 (four) times daily. 06/08/18   Binnie Rail, MD  Petrolatum-Zinc Oxide 49-15 % OINT Apply to affected area often until rash has resolved, then d/c 12/09/18   Binnie Rail, MD  pravastatin (PRAVACHOL) 40 MG tablet Take 1 tablet (40 mg total) by mouth at bedtime. 06/30/18   Binnie Rail, MD  risperiDONE (RISPERDAL) 0.25 MG tablet Take 1 tablet (0.25 mg total) by mouth at bedtime. 06/30/18   Binnie Rail, MD    Family History    Family History  Problem Relation Age of Onset  . Diabetes Brother   . Diabetes Sister    She indicated that her mother is deceased. She indicated that her father is deceased. She indicated that only one of her three sisters is alive. She indicated that one of her two brothers is deceased. She indicated that her maternal grandmother is deceased. She indicated that her maternal grandfather is deceased.  She indicated that her paternal grandmother is deceased. She indicated that her paternal grandfather is deceased.  Social History    Social History   Socioeconomic History  . Marital status: Married    Spouse name: [name]  . Number of children: Not on file  . Years of education: Not on file  . Highest education level: Not on file  Occupational History  . Occupation: retired  Tobacco Use  . Smoking status: Never Smoker  . Smokeless tobacco: Never Used  Substance and Sexual Activity  . Alcohol use: No  . Drug use: No  . Sexual activity: Not on file  Other Topics Concern  . Not on file  Social  History Narrative   Lives with husband. Mother had kidney problems.  Father had coronary artery  disease.  Both died when the patient was young.  She does not know how old  they were.  She has one brother who died at age 43 of a CVA, although, he  had coronary artery disease and diabetes.  Another brother died at age 21 of  a stroke.  He also had coronary artery disease.  One sister is living, she  has coronary artery disease and diabetes and one sister has anginal symptoms. Her sister has a pacemaker (history palpitations).          Social Determinants of Health   Financial Resource Strain:   . Difficulty of Paying Living Expenses: Not on file  Food Insecurity:   . Worried About Charity fundraiser in the Last Year: Not on file  . Ran Out of Food in the Last Year: Not on file  Transportation Needs:   . Lack of Transportation (Medical): Not on file  . Lack of Transportation (Non-Medical): Not on file  Physical Activity:   . Days of Exercise per Week: Not on file  . Minutes of Exercise per Session: Not on file  Stress:   . Feeling of Stress : Not on file  Social Connections:   . Frequency of Communication with Friends and Family: Not on file  . Frequency of Social Gatherings with Friends and Family: Not on file  . Attends Religious Services: Not on file  . Active Member of Clubs or  Organizations: Not on file  . Attends Archivist Meetings: Not on file  . Marital Status: Not on file  Intimate Partner Violence:   . Fear of Current or Ex-Partner: Not on file  . Emotionally Abused: Not on file  . Physically Abused: Not on file  . Sexually Abused: Not on file     Review of Systems    General:  No chills, fever, night sweats or weight changes.  Cardiovascular:  No chest pain, dyspnea on exertion, edema, orthopnea, palpitations, paroxysmal nocturnal dyspnea. Dermatological: No rash, lesions/masses Respiratory: No cough, dyspnea Urologic: No hematuria, dysuria Abdominal:   No nausea, vomiting, diarrhea, bright red blood per rectum, melena, or hematemesis Neurologic:  No visual changes, wkns, changes in mental status. All other systems reviewed and are otherwise negative except as noted above.  Physical Exam    VS:  BP (!) 97/56 (BP Location: Left Arm, Patient Position: Sitting, Cuff Size: Normal)   Pulse 71   Wt 165 lb (74.8 kg)   SpO2 97%   BMI 30.18 kg/m  , BMI Body mass index is 30.18 kg/m. GEN: Well nourished, well developed, in no acute distress. HEENT: normal. Neck: Supple, no JVD, carotid bruits, or masses. Cardiac: RRR, no murmurs, rubs, or gallops. No clubbing, cyanosis, edema.  Radials/DP/PT 2+ and equal bilaterally.  Respiratory:  Respirations regular and unlabored, clear to auscultation bilaterally. GI: Soft, nontender, nondistended, BS + x 4. MS: no deformity or atrophy. Skin: warm and dry, no rash. Neuro:  Strength and sensation are intact. Psych: Normal affect.  Accessory Clinical Findings    ECG personally reviewed by me today-V paced 71 bpm- No acute changes  EKG 03/21/2018 Normal sinus rhythm nonspecific intraventricular block 69 bpm Echocardiogram 06/28/2012 Study Conclusions   - Left ventricle: The cavity size was mildly reduced. Wall  thickness was increased in a pattern of moderate LVH.  Systolic function was  normal. The estimated ejection  fraction  was in the range of 55% to 65%. Wall motion was  normal; there were no regional wall motion abnormalities.  There was an increased relative contribution of atrial  contraction to ventricular filling.  - Aortic valve: A bioprosthesis was present. There was very  mild stenosis. Mean gradient: 11mm Hg (S). Peak gradient:  38mm Hg (S).  - Left atrium: The atrium was mildly dilated.    Assessment & Plan   1.  Coronary artery disease-no chest pain today. Continue Pravastatin 40 mg tablet daily Continue aspirin 81 mg daily Heart healthy low-sodium diet-salty six given Maintain physical activity-participating in physical therapy multiple times per week.  HTN-BP today 97/56 Heart healthy low-sodium diet-salty six given Maintain physical activity Ordered BMP  History of aortic valve replacement-bioprosthetic aortic valve 2003 Continue SBE prophylaxis  Hyperlipidemia-06/08/2018: Cholesterol 160; HDL 45.10; LDL Cholesterol 83; Triglycerides 155.0; VLDL 31.0 Continue Pravastatin 40 mg tablet daily Heart healthy low-sodium diet-salty six given Maintain physical activity  PAF-EKG today shows ventricular paced rhythm 71 bpm. Not a candidate for anticoagulation due to dementia and high fall risk  PPM-History of complete heart block 2013, [place] dual chamber pacemaker. followed by electrophysiology Battery remaining 61 months 12/27/2018  Disposition: Follow-up with Dr. Stanford Breed in 12 months.   Jossie Ng. [facility] [place] [address] Office [phone] Fax [phone]

## 2019-03-28 ENCOUNTER — Ambulatory Visit (INDEPENDENT_AMBULATORY_CARE_PROVIDER_SITE_OTHER): Payer: Medicare Other | Admitting: *Deleted

## 2019-03-28 ENCOUNTER — Encounter: Payer: Self-pay | Admitting: General Practice

## 2019-03-28 ENCOUNTER — Ambulatory Visit (INDEPENDENT_AMBULATORY_CARE_PROVIDER_SITE_OTHER): Payer: Medicare Other | Admitting: General Practice

## 2019-03-28 ENCOUNTER — Other Ambulatory Visit: Payer: Self-pay

## 2019-03-28 VITALS — BP 97/56 | HR 71 | Wt 165.0 lb

## 2019-03-28 DIAGNOSIS — I10 Essential (primary) hypertension: Secondary | ICD-10-CM

## 2019-03-28 DIAGNOSIS — F0390 Unspecified dementia without behavioral disturbance: Secondary | ICD-10-CM | POA: Diagnosis not present

## 2019-03-28 DIAGNOSIS — Z950 Presence of cardiac pacemaker: Secondary | ICD-10-CM

## 2019-03-28 DIAGNOSIS — I480 Paroxysmal atrial fibrillation: Secondary | ICD-10-CM | POA: Diagnosis not present

## 2019-03-28 DIAGNOSIS — Z79899 Other long term (current) drug therapy: Secondary | ICD-10-CM | POA: Diagnosis not present

## 2019-03-28 DIAGNOSIS — I2510 Atherosclerotic heart disease of native coronary artery without angina pectoris: Secondary | ICD-10-CM

## 2019-03-28 DIAGNOSIS — M810 Age-related osteoporosis without current pathological fracture: Secondary | ICD-10-CM | POA: Diagnosis not present

## 2019-03-28 DIAGNOSIS — Z952 Presence of prosthetic heart valve: Secondary | ICD-10-CM | POA: Diagnosis not present

## 2019-03-28 DIAGNOSIS — I129 Hypertensive chronic kidney disease with stage 1 through stage 4 chronic kidney disease, or unspecified chronic kidney disease: Secondary | ICD-10-CM | POA: Diagnosis not present

## 2019-03-28 DIAGNOSIS — F419 Anxiety disorder, unspecified: Secondary | ICD-10-CM | POA: Diagnosis not present

## 2019-03-28 DIAGNOSIS — M1611 Unilateral primary osteoarthritis, right hip: Secondary | ICD-10-CM | POA: Diagnosis not present

## 2019-03-28 DIAGNOSIS — I739 Peripheral vascular disease, unspecified: Secondary | ICD-10-CM | POA: Diagnosis not present

## 2019-03-28 LAB — CUP PACEART REMOTE DEVICE CHECK
Battery Remaining Longevity: 53 mo
Battery Remaining Percentage: 40 %
Battery Voltage: 2.84 V
Brady Statistic AP VP Percent: 3.5 %
Brady Statistic AP VS Percent: 6.5 %
Brady Statistic AS VP Percent: 46 %
Brady Statistic AS VS Percent: 44 %
Brady Statistic RA Percent Paced: 8.6 %
Brady Statistic RV Percent Paced: 50 %
Date Time Interrogation Session: 20210105025910
Implantable Lead Implant Date: 20131022
Implantable Lead Implant Date: 20131022
Implantable Lead Location: 753859
Implantable Lead Location: 753860
Implantable Lead Model: 1944
Implantable Lead Model: 1948
Implantable Pulse Generator Implant Date: 20131022
Lead Channel Impedance Value: 430 Ohm
Lead Channel Impedance Value: 540 Ohm
Lead Channel Pacing Threshold Amplitude: 0.5 V
Lead Channel Pacing Threshold Amplitude: 0.875 V
Lead Channel Pacing Threshold Pulse Width: 0.4 ms
Lead Channel Pacing Threshold Pulse Width: 0.4 ms
Lead Channel Sensing Intrinsic Amplitude: 12 mV
Lead Channel Sensing Intrinsic Amplitude: 5 mV
Lead Channel Setting Pacing Amplitude: 1.125
Lead Channel Setting Pacing Amplitude: 1.5 V
Lead Channel Setting Pacing Pulse Width: 0.4 ms
Lead Channel Setting Sensing Sensitivity: 2 mV
Pulse Gen Model: 2210
Pulse Gen Serial Number: 7393244

## 2019-03-28 LAB — BASIC METABOLIC PANEL
BUN/Creatinine Ratio: 16 (ref 12–28)
BUN: 27 mg/dL (ref 10–36)
CO2: 24 mmol/L (ref 20–29)
Calcium: 9.5 mg/dL (ref 8.7–10.3)
Chloride: 98 mmol/L (ref 96–106)
Creatinine, Ser: 1.73 mg/dL — ABNORMAL HIGH (ref 0.57–1.00)
GFR calc Af Amer: 29 mL/min/{1.73_m2} — ABNORMAL LOW (ref >59)
GFR calc non Af Amer: 25 mL/min/{1.73_m2} — ABNORMAL LOW (ref >59)
Glucose: 124 mg/dL — ABNORMAL HIGH (ref 65–99)
Potassium: 3.6 mmol/L (ref 3.5–5.2)
Sodium: 140 mmol/L (ref 134–144)

## 2019-03-28 NOTE — Patient Instructions (Signed)
Medication Instructions:  The current medical regimen is effective;  continue present plan and medications as directed. Please refer to the Current Medication list given to you today. If you need a refill on your cardiac medications before your next appointment, please call your pharmacy.  Follow-Up: IN 12 months Please call our office 2 months in advance, NOV 2021 to schedule this JAN 2022 appointment. In Person You may see Kirk Ruths, MD Coletta Memos, FNP or one of the following Advanced Practice Providers on your designated Care Team:  Kerin Ransom, PA-C  [place], [place].    Reduce your risk of getting COVID-19 With your heart disease it is especially important for people at increased risk of severe illness from COVID-19, and those who live with them, to protect themselves from getting COVID-19. The best way to protect yourself and to help reduce the spread of the virus that causes COVID-19 is to: Marland Kitchen Limit your interactions with other people as much as possible. . Take precautions to prevent getting COVID-19 when you do interact with others. If you start feeling sick and think you may have COVID-19, get in touch with your healthcare provider within 24  At [facility], you and your health needs are our priority.  As part of our continuing mission to provide you with exceptional heart care, we have created designated Provider Care Teams.  These Care Teams include your primary Cardiologist (physician) and Advanced Practice Providers (APPs -  Physician Assistants and Nurse Practitioners) who all work together to provide you with the care you need, when you need it.  Thank you for choosing [facility] at [facility]!!

## 2019-03-28 NOTE — Telephone Encounter (Signed)
Pt's daughter in law aware appt is office visit not virtual ./cy

## 2019-03-29 NOTE — Progress Notes (Signed)
PPM Remote  

## 2019-03-31 DIAGNOSIS — M1611 Unilateral primary osteoarthritis, right hip: Secondary | ICD-10-CM | POA: Diagnosis not present

## 2019-03-31 DIAGNOSIS — M810 Age-related osteoporosis without current pathological fracture: Secondary | ICD-10-CM | POA: Diagnosis not present

## 2019-03-31 DIAGNOSIS — F419 Anxiety disorder, unspecified: Secondary | ICD-10-CM | POA: Diagnosis not present

## 2019-03-31 DIAGNOSIS — I739 Peripheral vascular disease, unspecified: Secondary | ICD-10-CM | POA: Diagnosis not present

## 2019-03-31 DIAGNOSIS — I129 Hypertensive chronic kidney disease with stage 1 through stage 4 chronic kidney disease, or unspecified chronic kidney disease: Secondary | ICD-10-CM | POA: Diagnosis not present

## 2019-03-31 DIAGNOSIS — F0390 Unspecified dementia without behavioral disturbance: Secondary | ICD-10-CM | POA: Diagnosis not present

## 2019-04-04 DIAGNOSIS — I129 Hypertensive chronic kidney disease with stage 1 through stage 4 chronic kidney disease, or unspecified chronic kidney disease: Secondary | ICD-10-CM | POA: Diagnosis not present

## 2019-04-04 DIAGNOSIS — F419 Anxiety disorder, unspecified: Secondary | ICD-10-CM | POA: Diagnosis not present

## 2019-04-04 DIAGNOSIS — F0390 Unspecified dementia without behavioral disturbance: Secondary | ICD-10-CM | POA: Diagnosis not present

## 2019-04-04 DIAGNOSIS — M1611 Unilateral primary osteoarthritis, right hip: Secondary | ICD-10-CM | POA: Diagnosis not present

## 2019-04-04 DIAGNOSIS — M810 Age-related osteoporosis without current pathological fracture: Secondary | ICD-10-CM | POA: Diagnosis not present

## 2019-04-04 DIAGNOSIS — I739 Peripheral vascular disease, unspecified: Secondary | ICD-10-CM | POA: Diagnosis not present

## 2019-04-07 DIAGNOSIS — G301 Alzheimer's disease with late onset: Secondary | ICD-10-CM | POA: Diagnosis not present

## 2019-04-07 DIAGNOSIS — F331 Major depressive disorder, recurrent, moderate: Secondary | ICD-10-CM | POA: Diagnosis not present

## 2019-04-07 DIAGNOSIS — I129 Hypertensive chronic kidney disease with stage 1 through stage 4 chronic kidney disease, or unspecified chronic kidney disease: Secondary | ICD-10-CM | POA: Diagnosis not present

## 2019-04-07 DIAGNOSIS — F0281 Dementia in other diseases classified elsewhere with behavioral disturbance: Secondary | ICD-10-CM | POA: Diagnosis not present

## 2019-04-07 DIAGNOSIS — M810 Age-related osteoporosis without current pathological fracture: Secondary | ICD-10-CM | POA: Diagnosis not present

## 2019-04-07 DIAGNOSIS — F062 Psychotic disorder with delusions due to known physiological condition: Secondary | ICD-10-CM | POA: Diagnosis not present

## 2019-04-07 DIAGNOSIS — F419 Anxiety disorder, unspecified: Secondary | ICD-10-CM | POA: Diagnosis not present

## 2019-04-07 DIAGNOSIS — F0390 Unspecified dementia without behavioral disturbance: Secondary | ICD-10-CM | POA: Diagnosis not present

## 2019-04-07 DIAGNOSIS — M1611 Unilateral primary osteoarthritis, right hip: Secondary | ICD-10-CM | POA: Diagnosis not present

## 2019-04-07 DIAGNOSIS — I739 Peripheral vascular disease, unspecified: Secondary | ICD-10-CM | POA: Diagnosis not present

## 2019-04-10 DIAGNOSIS — F0390 Unspecified dementia without behavioral disturbance: Secondary | ICD-10-CM | POA: Diagnosis not present

## 2019-04-10 DIAGNOSIS — I739 Peripheral vascular disease, unspecified: Secondary | ICD-10-CM | POA: Diagnosis not present

## 2019-04-10 DIAGNOSIS — I129 Hypertensive chronic kidney disease with stage 1 through stage 4 chronic kidney disease, or unspecified chronic kidney disease: Secondary | ICD-10-CM | POA: Diagnosis not present

## 2019-04-10 DIAGNOSIS — M810 Age-related osteoporosis without current pathological fracture: Secondary | ICD-10-CM | POA: Diagnosis not present

## 2019-04-10 DIAGNOSIS — F419 Anxiety disorder, unspecified: Secondary | ICD-10-CM | POA: Diagnosis not present

## 2019-04-10 DIAGNOSIS — M1611 Unilateral primary osteoarthritis, right hip: Secondary | ICD-10-CM | POA: Diagnosis not present

## 2019-04-12 DIAGNOSIS — F0390 Unspecified dementia without behavioral disturbance: Secondary | ICD-10-CM | POA: Diagnosis not present

## 2019-04-12 DIAGNOSIS — M810 Age-related osteoporosis without current pathological fracture: Secondary | ICD-10-CM | POA: Diagnosis not present

## 2019-04-12 DIAGNOSIS — F419 Anxiety disorder, unspecified: Secondary | ICD-10-CM | POA: Diagnosis not present

## 2019-04-12 DIAGNOSIS — M1611 Unilateral primary osteoarthritis, right hip: Secondary | ICD-10-CM | POA: Diagnosis not present

## 2019-04-12 DIAGNOSIS — I739 Peripheral vascular disease, unspecified: Secondary | ICD-10-CM | POA: Diagnosis not present

## 2019-04-12 DIAGNOSIS — I129 Hypertensive chronic kidney disease with stage 1 through stage 4 chronic kidney disease, or unspecified chronic kidney disease: Secondary | ICD-10-CM | POA: Diagnosis not present

## 2019-04-14 DIAGNOSIS — F419 Anxiety disorder, unspecified: Secondary | ICD-10-CM | POA: Diagnosis not present

## 2019-04-14 DIAGNOSIS — I739 Peripheral vascular disease, unspecified: Secondary | ICD-10-CM | POA: Diagnosis not present

## 2019-04-14 DIAGNOSIS — F0390 Unspecified dementia without behavioral disturbance: Secondary | ICD-10-CM | POA: Diagnosis not present

## 2019-04-14 DIAGNOSIS — M810 Age-related osteoporosis without current pathological fracture: Secondary | ICD-10-CM | POA: Diagnosis not present

## 2019-04-14 DIAGNOSIS — I129 Hypertensive chronic kidney disease with stage 1 through stage 4 chronic kidney disease, or unspecified chronic kidney disease: Secondary | ICD-10-CM | POA: Diagnosis not present

## 2019-04-14 DIAGNOSIS — M1611 Unilateral primary osteoarthritis, right hip: Secondary | ICD-10-CM | POA: Diagnosis not present

## 2019-04-17 DIAGNOSIS — F0390 Unspecified dementia without behavioral disturbance: Secondary | ICD-10-CM | POA: Diagnosis not present

## 2019-04-17 DIAGNOSIS — I739 Peripheral vascular disease, unspecified: Secondary | ICD-10-CM | POA: Diagnosis not present

## 2019-04-17 DIAGNOSIS — F419 Anxiety disorder, unspecified: Secondary | ICD-10-CM | POA: Diagnosis not present

## 2019-04-17 DIAGNOSIS — I129 Hypertensive chronic kidney disease with stage 1 through stage 4 chronic kidney disease, or unspecified chronic kidney disease: Secondary | ICD-10-CM | POA: Diagnosis not present

## 2019-04-17 DIAGNOSIS — M1611 Unilateral primary osteoarthritis, right hip: Secondary | ICD-10-CM | POA: Diagnosis not present

## 2019-04-17 DIAGNOSIS — M810 Age-related osteoporosis without current pathological fracture: Secondary | ICD-10-CM | POA: Diagnosis not present

## 2019-04-18 DIAGNOSIS — I4891 Unspecified atrial fibrillation: Secondary | ICD-10-CM | POA: Diagnosis not present

## 2019-04-18 DIAGNOSIS — I129 Hypertensive chronic kidney disease with stage 1 through stage 4 chronic kidney disease, or unspecified chronic kidney disease: Secondary | ICD-10-CM | POA: Diagnosis not present

## 2019-04-18 DIAGNOSIS — I4430 Unspecified atrioventricular block: Secondary | ICD-10-CM | POA: Diagnosis not present

## 2019-04-18 DIAGNOSIS — R7303 Prediabetes: Secondary | ICD-10-CM | POA: Diagnosis not present

## 2019-04-18 DIAGNOSIS — Z96642 Presence of left artificial hip joint: Secondary | ICD-10-CM | POA: Diagnosis not present

## 2019-04-18 DIAGNOSIS — R4701 Aphasia: Secondary | ICD-10-CM | POA: Diagnosis not present

## 2019-04-18 DIAGNOSIS — E785 Hyperlipidemia, unspecified: Secondary | ICD-10-CM | POA: Diagnosis not present

## 2019-04-18 DIAGNOSIS — Z951 Presence of aortocoronary bypass graft: Secondary | ICD-10-CM | POA: Diagnosis not present

## 2019-04-18 DIAGNOSIS — F419 Anxiety disorder, unspecified: Secondary | ICD-10-CM | POA: Diagnosis not present

## 2019-04-18 DIAGNOSIS — N183 Chronic kidney disease, stage 3 unspecified: Secondary | ICD-10-CM | POA: Diagnosis not present

## 2019-04-18 DIAGNOSIS — I739 Peripheral vascular disease, unspecified: Secondary | ICD-10-CM | POA: Diagnosis not present

## 2019-04-18 DIAGNOSIS — Z952 Presence of prosthetic heart valve: Secondary | ICD-10-CM | POA: Diagnosis not present

## 2019-04-18 DIAGNOSIS — S91105D Unspecified open wound of left lesser toe(s) without damage to nail, subsequent encounter: Secondary | ICD-10-CM | POA: Diagnosis not present

## 2019-04-18 DIAGNOSIS — Z9181 History of falling: Secondary | ICD-10-CM | POA: Diagnosis not present

## 2019-04-18 DIAGNOSIS — M1611 Unilateral primary osteoarthritis, right hip: Secondary | ICD-10-CM | POA: Diagnosis not present

## 2019-04-18 DIAGNOSIS — M810 Age-related osteoporosis without current pathological fracture: Secondary | ICD-10-CM | POA: Diagnosis not present

## 2019-04-18 DIAGNOSIS — Z7982 Long term (current) use of aspirin: Secondary | ICD-10-CM | POA: Diagnosis not present

## 2019-04-18 DIAGNOSIS — K5790 Diverticulosis of intestine, part unspecified, without perforation or abscess without bleeding: Secondary | ICD-10-CM | POA: Diagnosis not present

## 2019-04-18 DIAGNOSIS — Z950 Presence of cardiac pacemaker: Secondary | ICD-10-CM | POA: Diagnosis not present

## 2019-04-18 DIAGNOSIS — F0390 Unspecified dementia without behavioral disturbance: Secondary | ICD-10-CM | POA: Diagnosis not present

## 2019-04-20 DIAGNOSIS — F0390 Unspecified dementia without behavioral disturbance: Secondary | ICD-10-CM | POA: Diagnosis not present

## 2019-04-20 DIAGNOSIS — M1611 Unilateral primary osteoarthritis, right hip: Secondary | ICD-10-CM | POA: Diagnosis not present

## 2019-04-20 DIAGNOSIS — M810 Age-related osteoporosis without current pathological fracture: Secondary | ICD-10-CM | POA: Diagnosis not present

## 2019-04-20 DIAGNOSIS — I739 Peripheral vascular disease, unspecified: Secondary | ICD-10-CM | POA: Diagnosis not present

## 2019-04-20 DIAGNOSIS — R7303 Prediabetes: Secondary | ICD-10-CM | POA: Diagnosis not present

## 2019-04-20 DIAGNOSIS — S91105D Unspecified open wound of left lesser toe(s) without damage to nail, subsequent encounter: Secondary | ICD-10-CM | POA: Diagnosis not present

## 2019-04-24 DIAGNOSIS — Z7982 Long term (current) use of aspirin: Secondary | ICD-10-CM | POA: Diagnosis not present

## 2019-04-24 DIAGNOSIS — R05 Cough: Secondary | ICD-10-CM | POA: Diagnosis not present

## 2019-04-24 DIAGNOSIS — I4891 Unspecified atrial fibrillation: Secondary | ICD-10-CM | POA: Diagnosis not present

## 2019-04-24 DIAGNOSIS — I739 Peripheral vascular disease, unspecified: Secondary | ICD-10-CM | POA: Diagnosis not present

## 2019-04-24 DIAGNOSIS — R7303 Prediabetes: Secondary | ICD-10-CM | POA: Diagnosis not present

## 2019-04-24 DIAGNOSIS — I10 Essential (primary) hypertension: Secondary | ICD-10-CM | POA: Diagnosis not present

## 2019-04-24 DIAGNOSIS — Z79899 Other long term (current) drug therapy: Secondary | ICD-10-CM | POA: Diagnosis not present

## 2019-04-24 DIAGNOSIS — M810 Age-related osteoporosis without current pathological fracture: Secondary | ICD-10-CM | POA: Diagnosis not present

## 2019-04-24 DIAGNOSIS — J988 Other specified respiratory disorders: Secondary | ICD-10-CM | POA: Diagnosis not present

## 2019-04-24 DIAGNOSIS — F0390 Unspecified dementia without behavioral disturbance: Secondary | ICD-10-CM | POA: Diagnosis not present

## 2019-04-24 DIAGNOSIS — M1611 Unilateral primary osteoarthritis, right hip: Secondary | ICD-10-CM | POA: Diagnosis not present

## 2019-04-24 DIAGNOSIS — S91105D Unspecified open wound of left lesser toe(s) without damage to nail, subsequent encounter: Secondary | ICD-10-CM | POA: Diagnosis not present

## 2019-04-27 DIAGNOSIS — I739 Peripheral vascular disease, unspecified: Secondary | ICD-10-CM | POA: Diagnosis not present

## 2019-04-27 DIAGNOSIS — I10 Essential (primary) hypertension: Secondary | ICD-10-CM | POA: Diagnosis not present

## 2019-04-27 DIAGNOSIS — M810 Age-related osteoporosis without current pathological fracture: Secondary | ICD-10-CM | POA: Diagnosis not present

## 2019-04-27 DIAGNOSIS — F0390 Unspecified dementia without behavioral disturbance: Secondary | ICD-10-CM | POA: Diagnosis not present

## 2019-04-27 DIAGNOSIS — Z79899 Other long term (current) drug therapy: Secondary | ICD-10-CM | POA: Diagnosis not present

## 2019-04-27 DIAGNOSIS — S91105D Unspecified open wound of left lesser toe(s) without damage to nail, subsequent encounter: Secondary | ICD-10-CM | POA: Diagnosis not present

## 2019-04-27 DIAGNOSIS — M1611 Unilateral primary osteoarthritis, right hip: Secondary | ICD-10-CM | POA: Diagnosis not present

## 2019-04-27 DIAGNOSIS — R7303 Prediabetes: Secondary | ICD-10-CM | POA: Diagnosis not present

## 2019-05-01 DIAGNOSIS — I10 Essential (primary) hypertension: Secondary | ICD-10-CM | POA: Diagnosis not present

## 2019-05-01 DIAGNOSIS — I739 Peripheral vascular disease, unspecified: Secondary | ICD-10-CM | POA: Diagnosis not present

## 2019-05-01 DIAGNOSIS — I4891 Unspecified atrial fibrillation: Secondary | ICD-10-CM | POA: Diagnosis not present

## 2019-05-01 DIAGNOSIS — R634 Abnormal weight loss: Secondary | ICD-10-CM | POA: Diagnosis not present

## 2019-05-01 DIAGNOSIS — M1611 Unilateral primary osteoarthritis, right hip: Secondary | ICD-10-CM | POA: Diagnosis not present

## 2019-05-01 DIAGNOSIS — S91105D Unspecified open wound of left lesser toe(s) without damage to nail, subsequent encounter: Secondary | ICD-10-CM | POA: Diagnosis not present

## 2019-05-01 DIAGNOSIS — R05 Cough: Secondary | ICD-10-CM | POA: Diagnosis not present

## 2019-05-01 DIAGNOSIS — M810 Age-related osteoporosis without current pathological fracture: Secondary | ICD-10-CM | POA: Diagnosis not present

## 2019-05-01 DIAGNOSIS — Z713 Dietary counseling and surveillance: Secondary | ICD-10-CM | POA: Diagnosis not present

## 2019-05-01 DIAGNOSIS — Z79899 Other long term (current) drug therapy: Secondary | ICD-10-CM | POA: Diagnosis not present

## 2019-05-01 DIAGNOSIS — F0390 Unspecified dementia without behavioral disturbance: Secondary | ICD-10-CM | POA: Diagnosis not present

## 2019-05-01 DIAGNOSIS — Z7982 Long term (current) use of aspirin: Secondary | ICD-10-CM | POA: Diagnosis not present

## 2019-05-01 DIAGNOSIS — R7303 Prediabetes: Secondary | ICD-10-CM | POA: Diagnosis not present

## 2019-05-02 DIAGNOSIS — G301 Alzheimer's disease with late onset: Secondary | ICD-10-CM | POA: Diagnosis not present

## 2019-05-02 DIAGNOSIS — F331 Major depressive disorder, recurrent, moderate: Secondary | ICD-10-CM | POA: Diagnosis not present

## 2019-05-02 DIAGNOSIS — F062 Psychotic disorder with delusions due to known physiological condition: Secondary | ICD-10-CM | POA: Diagnosis not present

## 2019-05-02 DIAGNOSIS — F0281 Dementia in other diseases classified elsewhere with behavioral disturbance: Secondary | ICD-10-CM | POA: Diagnosis not present

## 2019-05-04 DIAGNOSIS — M810 Age-related osteoporosis without current pathological fracture: Secondary | ICD-10-CM | POA: Diagnosis not present

## 2019-05-04 DIAGNOSIS — M1611 Unilateral primary osteoarthritis, right hip: Secondary | ICD-10-CM | POA: Diagnosis not present

## 2019-05-04 DIAGNOSIS — F0390 Unspecified dementia without behavioral disturbance: Secondary | ICD-10-CM | POA: Diagnosis not present

## 2019-05-04 DIAGNOSIS — I739 Peripheral vascular disease, unspecified: Secondary | ICD-10-CM | POA: Diagnosis not present

## 2019-05-04 DIAGNOSIS — S91105D Unspecified open wound of left lesser toe(s) without damage to nail, subsequent encounter: Secondary | ICD-10-CM | POA: Diagnosis not present

## 2019-05-04 DIAGNOSIS — R7303 Prediabetes: Secondary | ICD-10-CM | POA: Diagnosis not present

## 2019-05-08 DIAGNOSIS — M810 Age-related osteoporosis without current pathological fracture: Secondary | ICD-10-CM | POA: Diagnosis not present

## 2019-05-08 DIAGNOSIS — S91105D Unspecified open wound of left lesser toe(s) without damage to nail, subsequent encounter: Secondary | ICD-10-CM | POA: Diagnosis not present

## 2019-05-08 DIAGNOSIS — F0390 Unspecified dementia without behavioral disturbance: Secondary | ICD-10-CM | POA: Diagnosis not present

## 2019-05-08 DIAGNOSIS — I739 Peripheral vascular disease, unspecified: Secondary | ICD-10-CM | POA: Diagnosis not present

## 2019-05-08 DIAGNOSIS — M1611 Unilateral primary osteoarthritis, right hip: Secondary | ICD-10-CM | POA: Diagnosis not present

## 2019-05-08 DIAGNOSIS — R7303 Prediabetes: Secondary | ICD-10-CM | POA: Diagnosis not present

## 2019-05-10 DIAGNOSIS — R7303 Prediabetes: Secondary | ICD-10-CM | POA: Diagnosis not present

## 2019-05-10 DIAGNOSIS — M810 Age-related osteoporosis without current pathological fracture: Secondary | ICD-10-CM | POA: Diagnosis not present

## 2019-05-10 DIAGNOSIS — I739 Peripheral vascular disease, unspecified: Secondary | ICD-10-CM | POA: Diagnosis not present

## 2019-05-10 DIAGNOSIS — F0390 Unspecified dementia without behavioral disturbance: Secondary | ICD-10-CM | POA: Diagnosis not present

## 2019-05-10 DIAGNOSIS — M1611 Unilateral primary osteoarthritis, right hip: Secondary | ICD-10-CM | POA: Diagnosis not present

## 2019-05-10 DIAGNOSIS — S91105D Unspecified open wound of left lesser toe(s) without damage to nail, subsequent encounter: Secondary | ICD-10-CM | POA: Diagnosis not present

## 2019-05-16 DIAGNOSIS — I739 Peripheral vascular disease, unspecified: Secondary | ICD-10-CM | POA: Diagnosis not present

## 2019-05-16 DIAGNOSIS — M1611 Unilateral primary osteoarthritis, right hip: Secondary | ICD-10-CM | POA: Diagnosis not present

## 2019-05-16 DIAGNOSIS — F0390 Unspecified dementia without behavioral disturbance: Secondary | ICD-10-CM | POA: Diagnosis not present

## 2019-05-16 DIAGNOSIS — R7303 Prediabetes: Secondary | ICD-10-CM | POA: Diagnosis not present

## 2019-05-16 DIAGNOSIS — M810 Age-related osteoporosis without current pathological fracture: Secondary | ICD-10-CM | POA: Diagnosis not present

## 2019-05-16 DIAGNOSIS — S91105D Unspecified open wound of left lesser toe(s) without damage to nail, subsequent encounter: Secondary | ICD-10-CM | POA: Diagnosis not present

## 2019-05-18 DIAGNOSIS — Z950 Presence of cardiac pacemaker: Secondary | ICD-10-CM | POA: Diagnosis not present

## 2019-05-18 DIAGNOSIS — I4891 Unspecified atrial fibrillation: Secondary | ICD-10-CM | POA: Diagnosis not present

## 2019-05-18 DIAGNOSIS — M810 Age-related osteoporosis without current pathological fracture: Secondary | ICD-10-CM | POA: Diagnosis not present

## 2019-05-18 DIAGNOSIS — N183 Chronic kidney disease, stage 3 unspecified: Secondary | ICD-10-CM | POA: Diagnosis not present

## 2019-05-18 DIAGNOSIS — S91105D Unspecified open wound of left lesser toe(s) without damage to nail, subsequent encounter: Secondary | ICD-10-CM | POA: Diagnosis not present

## 2019-05-18 DIAGNOSIS — I739 Peripheral vascular disease, unspecified: Secondary | ICD-10-CM | POA: Diagnosis not present

## 2019-05-18 DIAGNOSIS — Z951 Presence of aortocoronary bypass graft: Secondary | ICD-10-CM | POA: Diagnosis not present

## 2019-05-18 DIAGNOSIS — I129 Hypertensive chronic kidney disease with stage 1 through stage 4 chronic kidney disease, or unspecified chronic kidney disease: Secondary | ICD-10-CM | POA: Diagnosis not present

## 2019-05-18 DIAGNOSIS — I4430 Unspecified atrioventricular block: Secondary | ICD-10-CM | POA: Diagnosis not present

## 2019-05-18 DIAGNOSIS — Z7982 Long term (current) use of aspirin: Secondary | ICD-10-CM | POA: Diagnosis not present

## 2019-05-18 DIAGNOSIS — K5790 Diverticulosis of intestine, part unspecified, without perforation or abscess without bleeding: Secondary | ICD-10-CM | POA: Diagnosis not present

## 2019-05-18 DIAGNOSIS — Z9181 History of falling: Secondary | ICD-10-CM | POA: Diagnosis not present

## 2019-05-18 DIAGNOSIS — R7303 Prediabetes: Secondary | ICD-10-CM | POA: Diagnosis not present

## 2019-05-18 DIAGNOSIS — F0390 Unspecified dementia without behavioral disturbance: Secondary | ICD-10-CM | POA: Diagnosis not present

## 2019-05-18 DIAGNOSIS — Z952 Presence of prosthetic heart valve: Secondary | ICD-10-CM | POA: Diagnosis not present

## 2019-05-18 DIAGNOSIS — R4701 Aphasia: Secondary | ICD-10-CM | POA: Diagnosis not present

## 2019-05-18 DIAGNOSIS — E785 Hyperlipidemia, unspecified: Secondary | ICD-10-CM | POA: Diagnosis not present

## 2019-05-18 DIAGNOSIS — F419 Anxiety disorder, unspecified: Secondary | ICD-10-CM | POA: Diagnosis not present

## 2019-05-18 DIAGNOSIS — Z96642 Presence of left artificial hip joint: Secondary | ICD-10-CM | POA: Diagnosis not present

## 2019-05-18 DIAGNOSIS — M1611 Unilateral primary osteoarthritis, right hip: Secondary | ICD-10-CM | POA: Diagnosis not present

## 2019-05-22 DIAGNOSIS — Z7982 Long term (current) use of aspirin: Secondary | ICD-10-CM | POA: Diagnosis not present

## 2019-05-22 DIAGNOSIS — I4891 Unspecified atrial fibrillation: Secondary | ICD-10-CM | POA: Diagnosis not present

## 2019-05-22 DIAGNOSIS — Z79899 Other long term (current) drug therapy: Secondary | ICD-10-CM | POA: Diagnosis not present

## 2019-05-22 DIAGNOSIS — I10 Essential (primary) hypertension: Secondary | ICD-10-CM | POA: Diagnosis not present

## 2019-05-22 DIAGNOSIS — Z713 Dietary counseling and surveillance: Secondary | ICD-10-CM | POA: Diagnosis not present

## 2019-05-22 DIAGNOSIS — R634 Abnormal weight loss: Secondary | ICD-10-CM | POA: Diagnosis not present

## 2019-05-24 DIAGNOSIS — R7303 Prediabetes: Secondary | ICD-10-CM | POA: Diagnosis not present

## 2019-05-24 DIAGNOSIS — I739 Peripheral vascular disease, unspecified: Secondary | ICD-10-CM | POA: Diagnosis not present

## 2019-05-24 DIAGNOSIS — M1611 Unilateral primary osteoarthritis, right hip: Secondary | ICD-10-CM | POA: Diagnosis not present

## 2019-05-24 DIAGNOSIS — F0390 Unspecified dementia without behavioral disturbance: Secondary | ICD-10-CM | POA: Diagnosis not present

## 2019-05-24 DIAGNOSIS — M810 Age-related osteoporosis without current pathological fracture: Secondary | ICD-10-CM | POA: Diagnosis not present

## 2019-05-24 DIAGNOSIS — S91105D Unspecified open wound of left lesser toe(s) without damage to nail, subsequent encounter: Secondary | ICD-10-CM | POA: Diagnosis not present

## 2019-05-27 DIAGNOSIS — L84 Corns and callosities: Secondary | ICD-10-CM | POA: Diagnosis not present

## 2019-05-27 DIAGNOSIS — L602 Onychogryphosis: Secondary | ICD-10-CM | POA: Diagnosis not present

## 2019-05-27 DIAGNOSIS — I739 Peripheral vascular disease, unspecified: Secondary | ICD-10-CM | POA: Diagnosis not present

## 2019-05-29 DIAGNOSIS — R7303 Prediabetes: Secondary | ICD-10-CM | POA: Diagnosis not present

## 2019-05-29 DIAGNOSIS — F0390 Unspecified dementia without behavioral disturbance: Secondary | ICD-10-CM | POA: Diagnosis not present

## 2019-05-29 DIAGNOSIS — M1611 Unilateral primary osteoarthritis, right hip: Secondary | ICD-10-CM | POA: Diagnosis not present

## 2019-05-29 DIAGNOSIS — M810 Age-related osteoporosis without current pathological fracture: Secondary | ICD-10-CM | POA: Diagnosis not present

## 2019-05-29 DIAGNOSIS — S91105D Unspecified open wound of left lesser toe(s) without damage to nail, subsequent encounter: Secondary | ICD-10-CM | POA: Diagnosis not present

## 2019-05-29 DIAGNOSIS — I739 Peripheral vascular disease, unspecified: Secondary | ICD-10-CM | POA: Diagnosis not present

## 2019-05-31 DIAGNOSIS — F0390 Unspecified dementia without behavioral disturbance: Secondary | ICD-10-CM | POA: Diagnosis not present

## 2019-05-31 DIAGNOSIS — M810 Age-related osteoporosis without current pathological fracture: Secondary | ICD-10-CM | POA: Diagnosis not present

## 2019-05-31 DIAGNOSIS — R7303 Prediabetes: Secondary | ICD-10-CM | POA: Diagnosis not present

## 2019-05-31 DIAGNOSIS — S91105D Unspecified open wound of left lesser toe(s) without damage to nail, subsequent encounter: Secondary | ICD-10-CM | POA: Diagnosis not present

## 2019-05-31 DIAGNOSIS — M1611 Unilateral primary osteoarthritis, right hip: Secondary | ICD-10-CM | POA: Diagnosis not present

## 2019-05-31 DIAGNOSIS — I739 Peripheral vascular disease, unspecified: Secondary | ICD-10-CM | POA: Diagnosis not present

## 2019-06-02 DIAGNOSIS — G301 Alzheimer's disease with late onset: Secondary | ICD-10-CM | POA: Diagnosis not present

## 2019-06-02 DIAGNOSIS — F331 Major depressive disorder, recurrent, moderate: Secondary | ICD-10-CM | POA: Diagnosis not present

## 2019-06-02 DIAGNOSIS — F062 Psychotic disorder with delusions due to known physiological condition: Secondary | ICD-10-CM | POA: Diagnosis not present

## 2019-06-02 DIAGNOSIS — F0281 Dementia in other diseases classified elsewhere with behavioral disturbance: Secondary | ICD-10-CM | POA: Diagnosis not present

## 2019-06-06 DIAGNOSIS — M810 Age-related osteoporosis without current pathological fracture: Secondary | ICD-10-CM | POA: Diagnosis not present

## 2019-06-06 DIAGNOSIS — F0390 Unspecified dementia without behavioral disturbance: Secondary | ICD-10-CM | POA: Diagnosis not present

## 2019-06-06 DIAGNOSIS — M1611 Unilateral primary osteoarthritis, right hip: Secondary | ICD-10-CM | POA: Diagnosis not present

## 2019-06-06 DIAGNOSIS — I739 Peripheral vascular disease, unspecified: Secondary | ICD-10-CM | POA: Diagnosis not present

## 2019-06-06 DIAGNOSIS — R7303 Prediabetes: Secondary | ICD-10-CM | POA: Diagnosis not present

## 2019-06-06 DIAGNOSIS — S91105D Unspecified open wound of left lesser toe(s) without damage to nail, subsequent encounter: Secondary | ICD-10-CM | POA: Diagnosis not present

## 2019-06-08 DIAGNOSIS — R7303 Prediabetes: Secondary | ICD-10-CM | POA: Diagnosis not present

## 2019-06-08 DIAGNOSIS — M1611 Unilateral primary osteoarthritis, right hip: Secondary | ICD-10-CM | POA: Diagnosis not present

## 2019-06-08 DIAGNOSIS — I739 Peripheral vascular disease, unspecified: Secondary | ICD-10-CM | POA: Diagnosis not present

## 2019-06-08 DIAGNOSIS — F0390 Unspecified dementia without behavioral disturbance: Secondary | ICD-10-CM | POA: Diagnosis not present

## 2019-06-08 DIAGNOSIS — M810 Age-related osteoporosis without current pathological fracture: Secondary | ICD-10-CM | POA: Diagnosis not present

## 2019-06-08 DIAGNOSIS — S91105D Unspecified open wound of left lesser toe(s) without damage to nail, subsequent encounter: Secondary | ICD-10-CM | POA: Diagnosis not present

## 2019-06-12 DIAGNOSIS — M810 Age-related osteoporosis without current pathological fracture: Secondary | ICD-10-CM | POA: Diagnosis not present

## 2019-06-12 DIAGNOSIS — Z79899 Other long term (current) drug therapy: Secondary | ICD-10-CM | POA: Diagnosis not present

## 2019-06-12 DIAGNOSIS — I4891 Unspecified atrial fibrillation: Secondary | ICD-10-CM | POA: Diagnosis not present

## 2019-06-12 DIAGNOSIS — R1310 Dysphagia, unspecified: Secondary | ICD-10-CM | POA: Diagnosis not present

## 2019-06-12 DIAGNOSIS — I10 Essential (primary) hypertension: Secondary | ICD-10-CM | POA: Diagnosis not present

## 2019-06-12 DIAGNOSIS — I739 Peripheral vascular disease, unspecified: Secondary | ICD-10-CM | POA: Diagnosis not present

## 2019-06-12 DIAGNOSIS — Z713 Dietary counseling and surveillance: Secondary | ICD-10-CM | POA: Diagnosis not present

## 2019-06-12 DIAGNOSIS — F0390 Unspecified dementia without behavioral disturbance: Secondary | ICD-10-CM | POA: Diagnosis not present

## 2019-06-12 DIAGNOSIS — R634 Abnormal weight loss: Secondary | ICD-10-CM | POA: Diagnosis not present

## 2019-06-12 DIAGNOSIS — M1611 Unilateral primary osteoarthritis, right hip: Secondary | ICD-10-CM | POA: Diagnosis not present

## 2019-06-12 DIAGNOSIS — Z7982 Long term (current) use of aspirin: Secondary | ICD-10-CM | POA: Diagnosis not present

## 2019-06-12 DIAGNOSIS — R7303 Prediabetes: Secondary | ICD-10-CM | POA: Diagnosis not present

## 2019-06-12 DIAGNOSIS — S91105D Unspecified open wound of left lesser toe(s) without damage to nail, subsequent encounter: Secondary | ICD-10-CM | POA: Diagnosis not present

## 2019-06-14 ENCOUNTER — Emergency Department (HOSPITAL_COMMUNITY): Payer: Medicare Other

## 2019-06-14 ENCOUNTER — Emergency Department (HOSPITAL_COMMUNITY)
Admission: EM | Admit: 2019-06-14 | Discharge: 2019-06-15 | Disposition: A | Discharge status: Home / Self Care | Payer: Medicare Other | Attending: Emergency Medicine | Admitting: Emergency Medicine

## 2019-06-14 ENCOUNTER — Other Ambulatory Visit: Payer: Self-pay

## 2019-06-14 ENCOUNTER — Encounter (HOSPITAL_COMMUNITY): Payer: Self-pay

## 2019-06-14 DIAGNOSIS — S91105D Unspecified open wound of left lesser toe(s) without damage to nail, subsequent encounter: Secondary | ICD-10-CM | POA: Diagnosis not present

## 2019-06-14 DIAGNOSIS — R7303 Prediabetes: Secondary | ICD-10-CM | POA: Diagnosis not present

## 2019-06-14 DIAGNOSIS — I711 Thoracic aortic aneurysm, ruptured, unspecified: Secondary | ICD-10-CM

## 2019-06-14 DIAGNOSIS — Z79899 Other long term (current) drug therapy: Secondary | ICD-10-CM | POA: Insufficient documentation

## 2019-06-14 DIAGNOSIS — I2510 Atherosclerotic heart disease of native coronary artery without angina pectoris: Secondary | ICD-10-CM | POA: Insufficient documentation

## 2019-06-14 DIAGNOSIS — M810 Age-related osteoporosis without current pathological fracture: Secondary | ICD-10-CM | POA: Diagnosis not present

## 2019-06-14 DIAGNOSIS — Z7982 Long term (current) use of aspirin: Secondary | ICD-10-CM | POA: Diagnosis not present

## 2019-06-14 DIAGNOSIS — I4891 Unspecified atrial fibrillation: Secondary | ICD-10-CM | POA: Insufficient documentation

## 2019-06-14 DIAGNOSIS — Z950 Presence of cardiac pacemaker: Secondary | ICD-10-CM | POA: Diagnosis not present

## 2019-06-14 DIAGNOSIS — F0390 Unspecified dementia without behavioral disturbance: Secondary | ICD-10-CM | POA: Diagnosis not present

## 2019-06-14 DIAGNOSIS — Z951 Presence of aortocoronary bypass graft: Secondary | ICD-10-CM | POA: Diagnosis not present

## 2019-06-14 DIAGNOSIS — N183 Chronic kidney disease, stage 3 unspecified: Secondary | ICD-10-CM | POA: Diagnosis not present

## 2019-06-14 DIAGNOSIS — I739 Peripheral vascular disease, unspecified: Secondary | ICD-10-CM | POA: Diagnosis not present

## 2019-06-14 DIAGNOSIS — I712 Thoracic aortic aneurysm, without rupture: Secondary | ICD-10-CM | POA: Diagnosis not present

## 2019-06-14 DIAGNOSIS — K5730 Diverticulosis of large intestine without perforation or abscess without bleeding: Secondary | ICD-10-CM | POA: Diagnosis not present

## 2019-06-14 DIAGNOSIS — I129 Hypertensive chronic kidney disease with stage 1 through stage 4 chronic kidney disease, or unspecified chronic kidney disease: Secondary | ICD-10-CM | POA: Diagnosis not present

## 2019-06-14 DIAGNOSIS — K5900 Constipation, unspecified: Secondary | ICD-10-CM

## 2019-06-14 DIAGNOSIS — I714 Abdominal aortic aneurysm, without rupture: Secondary | ICD-10-CM | POA: Diagnosis not present

## 2019-06-14 DIAGNOSIS — R1011 Right upper quadrant pain: Secondary | ICD-10-CM | POA: Diagnosis not present

## 2019-06-14 DIAGNOSIS — M1611 Unilateral primary osteoarthritis, right hip: Secondary | ICD-10-CM | POA: Diagnosis not present

## 2019-06-14 LAB — COMPREHENSIVE METABOLIC PANEL
ALT: 14 U/L (ref 0–44)
AST: 20 U/L (ref 15–41)
Albumin: 3.3 g/dL — ABNORMAL LOW (ref 3.5–5.0)
Alkaline Phosphatase: 109 U/L (ref 38–126)
Anion gap: 11 (ref 5–15)
BUN: 29 mg/dL — ABNORMAL HIGH (ref 8–23)
CO2: 26 mmol/L (ref 22–32)
Calcium: 8.9 mg/dL (ref 8.9–10.3)
Chloride: 104 mmol/L (ref 98–111)
Creatinine, Ser: 1.67 mg/dL — ABNORMAL HIGH (ref 0.44–1.00)
GFR calc Af Amer: 30 mL/min — ABNORMAL LOW (ref >60)
GFR calc non Af Amer: 26 mL/min — ABNORMAL LOW (ref >60)
Glucose, Bld: 99 mg/dL (ref 70–99)
Potassium: 3.6 mmol/L (ref 3.5–5.1)
Sodium: 141 mmol/L (ref 135–145)
Total Bilirubin: 0.6 mg/dL (ref 0.3–1.2)
Total Protein: 6.5 g/dL (ref 6.5–8.1)

## 2019-06-14 LAB — CBC WITH DIFFERENTIAL/PLATELET
Abs Immature Granulocytes: 0.03 10*3/uL (ref 0.00–0.07)
Basophils Absolute: 0 10*3/uL (ref 0.0–0.1)
Basophils Relative: 0 %
Eosinophils Absolute: 0.2 10*3/uL (ref 0.0–0.5)
Eosinophils Relative: 2 %
HCT: 37.3 % (ref 36.0–46.0)
Hemoglobin: 11.7 g/dL — ABNORMAL LOW (ref 12.0–15.0)
Immature Granulocytes: 0 %
Lymphocytes Relative: 15 %
Lymphs Abs: 1.2 10*3/uL (ref 0.7–4.0)
MCH: 28.4 pg (ref 26.0–34.0)
MCHC: 31.4 g/dL (ref 30.0–36.0)
MCV: 90.5 fL (ref 80.0–100.0)
Monocytes Absolute: 0.9 10*3/uL (ref 0.1–1.0)
Monocytes Relative: 12 %
Neutro Abs: 5.7 10*3/uL (ref 1.7–7.7)
Neutrophils Relative %: 71 %
Platelets: 220 10*3/uL (ref 150–400)
RBC: 4.12 MIL/uL (ref 3.87–5.11)
RDW: 17.1 % — ABNORMAL HIGH (ref 11.5–15.5)
WBC: 8.1 10*3/uL (ref 4.0–10.5)
nRBC: 0 % (ref 0.0–0.2)

## 2019-06-14 LAB — LIPASE, BLOOD: Lipase: 41 U/L (ref 11–51)

## 2019-06-14 MED ORDER — DIPHENHYDRAMINE HCL 50 MG/ML IJ SOLN: 50.0000 mg | Freq: Once | INTRAMUSCULAR | Status: DC

## 2019-06-14 MED ORDER — DIPHENHYDRAMINE HCL 25 MG PO CAPS: 50.0000 mg | ORAL_CAPSULE | Freq: Once | ORAL | Status: DC

## 2019-06-14 MED ORDER — HYDROCORTISONE NA SUCCINATE PF 250 MG IJ SOLR: 200.0000 mg | Freq: Once | INTRAMUSCULAR | Status: DC

## 2019-06-14 NOTE — ED Triage Notes (Signed)
Pt BIB [facility] from [facility] for RUQ abd pain starting around 1800/dinner time. Pt heard moaning by staff, pt's family requested she come to [facility] rather than [facility]. VSS en route, CBG 230. Hx HTN, constipation, alzheimers, CKD. Pt denies any other complaints at this time.

## 2019-06-14 NOTE — ED Provider Notes (Signed)
[facility] EMERGENCY DEPARTMENT Provider Note   CSN: [id number] Arrival date & time: 06/14/19  2104     History Chief Complaint  Patient presents with  . Abdominal Pain    [name] is a 86 y.o. female history of A. fib, CAD, heart block with Saint Jude, dementia, hypertension, GERD, thoracic aortic aneurysm, CKD.  History obtained solely from RN as patient with severe dementia.  Patient brought in by EMS for right upper quadrant pain around 6 PM today.  Vital signs stable in route, CBG 230.  Patient oriented to name only, all other answers are the word "yes".  Level 5 caveat dementia.  HPI     Past Medical History:  Diagnosis Date  . Anemia, unspecified   . Anxiety state, unspecified   . Atrial fibrillation (HCC)    not anticoagulation candidate due to falls  . CAD (coronary artery disease)    s/p CABG  . Carotid artery stenosis   . Complete heart block ([place]) 12/2011   s/p St.Jude dual chamber PPM 01/12/12  . Dementia ([place])   . Dementia ([place]) 05/09/2017  . Diverticulosis of colon (without mention of hemorrhage)   . Esophageal reflux   . HTN (hypertension)   . Hyperlipidemia   . Osteoarthrosis, unspecified whether generalized or localized, unspecified site   . Osteoporosis, unspecified   . Pacemaker-St.Jude 01/13/2012  . S/P AVR (aortic valve replacement) 2003   bioprosthetic   . Thoracic aortic aneurysm ([place]) 2003   s/p repair  . Unspecified chronic bronchitis [facility])     Patient Active Problem List   Diagnosis Date Noted  . Hematuria 09/20/2018  . Bilateral leg edema 09/20/2018  . Prediabetes 09/09/2016  . CKD (chronic kidney disease) stage 3, GFR 30-59 ml/min 09/10/2015  . Arthritis of right hip, severe 09/10/2015  . Pacemaker-St.Jude 01/13/2012  . Second degree Mobitz II AV block 01/11/2012  . CAROTID STENOSIS 11/29/2008  . Hearing loss 10/12/2008  . Osteoporosis 05/08/2008  . AORTIC VALVE REPLACEMENT, HX OF 05/08/2008  .  Hyperlipidemia 04/13/2007  . Mild dementia ([place]) 04/13/2007  . Coronary atherosclerosis 04/13/2007  . Paroxysmal atrial fibrillation ([place]) 04/13/2007  . DIVERTICULOSIS OF COLON 04/13/2007  . Anxiety state 01/18/2007  . Essential hypertension 01/18/2007    Past Surgical History:  Procedure Laterality Date  . AORTIC VALVE REPLACEMENT  2003  . CATARACT EXTRACTION     bilateral  . CORONARY ARTERY BYPASS GRAFT    . INTRAOCULAR LENS INSERTION     bilateral  . PACEMAKER INSERTION  01/12/12   SJM Accent DR RF implanted by Dr Rayann Heman for CHB  . PERMANENT PACEMAKER INSERTION N/A 01/12/2012   Procedure: PERMANENT PACEMAKER INSERTION;  Surgeon: Thompson Grayer, MD;  Location: [facility] CATH LAB;  Service: Cardiovascular;  Laterality: N/A;  . [place]   left  . VESICOVAGINAL FISTULA CLOSURE W/ TAH       OB History   No obstetric history on file.     Family History  Problem Relation Age of Onset  . Diabetes Brother   . Diabetes Sister     Social History   Tobacco Use  . Smoking status: Never Smoker  . Smokeless tobacco: Never Used  Substance Use Topics  . Alcohol use: No  . Drug use: No    Home Medications Prior to Admission medications   Medication Sig Start Date End Date Taking? Authorizing Provider  acetaminophen (TYLENOL) 325 MG tablet Take 650 mg by mouth every 6 (  six) hours as needed (pain).     [provider]  alendronate (FOSAMAX) 70 MG tablet TAKE 1 TABLET BY MOUTH EVERY 7 DAYS, TAKE WITH A FULL GLASS OF WATER ON AN EMPTY STOMACH 11/18/18   Binnie Rail, MD  aspirin EC 81 MG tablet Take 1 tablet (81 mg total) by mouth daily. 03/21/18   Lelon Perla, MD  cholecalciferol (VITAMIN D) 1000 UNITS tablet Take 1,000 Units by mouth daily with breakfast.     [provider]  citalopram (CELEXA) 20 MG tablet TAKE 1 TABLET BY MOUTH ONCE DAILY AT  6PM 08/17/18   Binnie Rail, MD  folic acid (FOLVITE) 1 MG tablet Take 1 tablet (1 mg total) by  mouth daily. 11/16/18   Binnie Rail, MD  furosemide (LASIX) 40 MG tablet Take 2 tablets by mouth once daily 11/15/18   Binnie Rail, MD  Lidocaine HCl (LIDOCAINE PLUS) 4 % CREA Apply 1 application topically 3 (three) times daily.    [provider]  nystatin (MYCOSTATIN/NYSTOP) powder Apply topically 4 (four) times daily. 06/08/18   Binnie Rail, MD  Petrolatum-Zinc Oxide 49-15 % OINT Apply to affected area often until rash has resolved, then d/c 12/09/18   Binnie Rail, MD  pravastatin (PRAVACHOL) 40 MG tablet Take 1 tablet (40 mg total) by mouth at bedtime. 06/30/18   Binnie Rail, MD  risperiDONE (RISPERDAL) 0.25 MG tablet Take 1 tablet (0.25 mg total) by mouth at bedtime. 06/30/18   Binnie Rail, MD    Allergies    Coumadin [warfarin sodium], Clarithromycin, Iohexol, Nsaids, Penicillins, Prednisone, Statins, and Sulfonamide derivatives  Review of Systems   Review of Systems  Unable to perform ROS: Dementia    Physical Exam Updated Vital Signs BP 126/67 (BP Location: Right Arm)   Pulse 76   Temp 98.6 F (37 C) (Oral)   Resp 16   Ht 5\' 3"  (1.6 m)   Wt 81.6 kg   SpO2 94%   BMI 31.89 kg/m   Physical Exam Constitutional:      General: She is not in acute distress.    Appearance: Normal appearance. She is well-developed. She is obese. She is not ill-appearing or diaphoretic.  HENT:     Head: Normocephalic and atraumatic.     Right Ear: External ear normal.     Left Ear: External ear normal.     Nose: Nose normal.  Eyes:     General: Vision grossly intact. Gaze aligned appropriately.     Pupils: Pupils are equal, round, and reactive to light.  Neck:     Trachea: Trachea and phonation normal. No tracheal deviation.  Pulmonary:     Effort: Pulmonary effort is normal. No respiratory distress.  Abdominal:     General: There is no distension.     Palpations: Abdomen is soft.     Tenderness: There is no abdominal tenderness. There is no guarding or rebound.    Musculoskeletal:        General: Normal range of motion.     Cervical back: Normal range of motion.  Skin:    General: Skin is warm and dry.  Neurological:     Mental Status: She is alert.     GCS: GCS eye subscore is 4. GCS verbal subscore is 5. GCS motor subscore is 6.     Comments: Speech is clear and goal oriented, follows commands Major Cranial nerves without deficit, no facial droop Moves extremities without  ataxia, coordination intact  Psychiatric:        Behavior: Behavior normal.     ED Results / Procedures / Treatments   Labs (all labs ordered are listed, but only abnormal results are displayed) Labs Reviewed  CBC WITH DIFFERENTIAL/PLATELET - Abnormal; Notable for the following components:      Result Value   Hemoglobin 11.7 (*)    RDW 17.1 (*)    All other components within normal limits  COMPREHENSIVE METABOLIC PANEL - Abnormal; Notable for the following components:   BUN 29 (*)    Creatinine, Ser 1.67 (*)    Albumin 3.3 (*)    GFR calc non Af Amer 26 (*)    GFR calc Af Amer 30 (*)    All other components within normal limits  LIPASE, BLOOD  URINALYSIS, ROUTINE W REFLEX MICROSCOPIC    EKG None  Radiology DG Chest 2 View  Result Date: 06/14/2019 CLINICAL DATA:  Right upper quadrant pain. EXAM: CHEST - 2 VIEW COMPARISON:  January 11, 2012 FINDINGS: There is a dual lead AICD. Multiple sternal wires are noted. The cardiac silhouette is within normal limits. There is marked severity dilatation of the thoracic aorta. This is markedly increased in severity when compared to the prior study. Degenerative changes seen throughout the thoracic spine. IMPRESSION: Marked severity dilatation of the thoracic aorta. This is markedly increased in severity when compared to the prior study. CT chest with contrast is recommended for further evaluation. Electronically Signed   By: Virgina Norfolk M.D.   On: 06/14/2019 23:18    Procedures Procedures (including critical care  time)  Medications Ordered in ED Medications  hydrocortisone sodium succinate (SOLU-CORTEF) injection 200 mg (has no administration in time range)  diphenhydrAMINE (BENADRYL) capsule 50 mg (has no administration in time range)    Or  diphenhydrAMINE (BENADRYL) injection 50 mg (has no administration in time range)    ED Course  I have reviewed the triage vital signs and the nursing notes.  Pertinent labs & imaging results that were available during my care of the patient were reviewed by me and considered in my medical decision making (see chart for details).  Clinical Course as of Jun 15 27  Wed Jun 14, 2019  2116 [phone]: No answer   [BM]  2219 [phone]: Wrong number   [BM]  2220 [phone]: No answer   [BM]  2222 [phone]: No answer   [BM]  2223 [phone]: No answer   [BM]    Clinical Course User Index [BM] Gari Crown   MDM Rules/Calculators/A&P                     86 year old female with history as detailed above presents today for abdominal pain reported by facility.  She has severe dementia, oriented to her name all other answers are only the word yes.  Will obtain abdominal pain lab work and monitor.  Vital signs stable on arrival.  She is well-appearing, pleasant interactive with exam, cranial nerves intact, moves extremities with equal strength, abdomen soft nontender and without peritoneal signs.  Will obtain CT of the abdomen pelvis at this time as unsure as etiology of patient's pain and she is unable to provide any additional history. - I attempted to call all the listed numbers on patient's chart without answer.  Additionally tried to call patient's facility through [employer] listed phone number unfortunately no answer. - CXR:    IMPRESSION:  Marked severity dilatation of the  thoracic aorta. This is markedly  increased in severity when compared to the prior study. CT chest  with contrast is recommended for further evaluation.  I  have reviewed x-ray with Dr. Tamera Punt, will obtain CT angio dissection study for evaluation.  Unfortunately patient with listed contrast allergy will need to premedicate.  Patient's vital signs are stable, on reassessment she is sleeping comfortably does not appear to be in any acute distress. - CBC shows hemoglobin 11.7, slightly decreased compared to prior, no leukocytosis to suggest infection.  Lipase within normal limits no evidence of pancreatitis.  CMP shows creatinine of 1.67 and BUN 29, appears baseline. - Informed by radiology tech unfortunately patient's GFR is too low to obtain a contrasted study.    11:46 PM: I spoke with radiologist Dr. Nyoka Cowden who advised that a contrasted CT study is the definitive test and would be necessary for rule out however could consider possible echocardiogram. - Patient's son Ron who is power of attorney and has arrived to the ER.  He advises that patient is DNR/DNI.  Extensive shared decision-making conversation was made between patient, Dr. Dina Rich, Maree Erie and myself.  We discussed the risks versus benefits of obtaining contrasted CT study for further evaluation of patient's aorta and that even if further information is obtained this may not lead to future intervention as patient is a poor surgical candidate.  Ron stated understanding of her risks versus benefits of contrasted CT and would still like to proceed with CT angiogram at this time, he agrees that patient would not want surgery if needed but he would like more information pertaining to patient's condition today.  Patient will be given fluid bolus, premedication for contrast allergy and will obtain CT dissection study. - On reassessment patient sleeping comfortably no acute distress. - Care handoff given to [place], PA-C at shift change.  Plan is to follow-up on CT dissection study.  Previous CT abdomen pelvis noncontrast is still pending as well.  Disposition per oncoming team.    Note: Portions of  this report may have been transcribed using voice recognition software. Every effort was made to ensure accuracy; however, inadvertent computerized transcription errors may still be present. Final Clinical Impression(s) / ED Diagnoses Final diagnoses:  None    Rx / DC Orders ED Discharge Orders    None       Gari Crown 06/15/19 0029    Merryl Hacker, MD 06/15/19 [phone]

## 2019-06-14 NOTE — ED Notes (Signed)
Pt transported to xray/CT via stretcher

## 2019-06-14 NOTE — ED Provider Notes (Addendum)
86 year old female received at signout from [place] pending CTA dissection study:  "[name] is a 86 y.o. female history of A. fib, CAD, heart block with Saint Jude, dementia, hypertension, GERD, thoracic aortic aneurysm, CKD.  History obtained solely from RN as patient with severe dementia.  Patient brought in by EMS for right upper quadrant pain around 6 PM today.  Vital signs stable in route, CBG 230.  Patient oriented to name only, all other answers are the word "yes".  Level 5 caveat dementia."  Physical Exam  BP (!) 157/88 (BP Location: Left Arm)   Pulse 76   Temp 99.1 F (37.3 C) (Oral)   Resp 16   Ht 5\' 3"  (1.6 m)   Wt 81.6 kg   SpO2 96%   BMI 31.89 kg/m   Physical Exam Vitals and nursing note reviewed.  Constitutional:      General: She is not in acute distress.    Comments: Pleasantly demented  HENT:     Head: Normocephalic.  Eyes:     Conjunctiva/sclera: Conjunctivae normal.  Cardiovascular:     Rate and Rhythm: Normal rate and regular rhythm.     Heart sounds: No murmur. No friction rub. No gallop.   Pulmonary:     Effort: Pulmonary effort is normal. No respiratory distress.  Abdominal:     General: There is no distension.     Palpations: Abdomen is soft.     Tenderness: There is abdominal tenderness.     Comments: Mild diffuse abdominal pain throughout the abdomen.  No rebound or guarding.  Musculoskeletal:     Cervical back: Neck supple.  Skin:    General: Skin is warm.     Findings: No rash.  Neurological:     Mental Status: She is alert.     ED Course/Procedures   Clinical Course as of Jun 14 705  Wed Jun 14, 2019  2116 [phone]: No answer   [BM]  2219 [phone]: Wrong number   [BM]  2220 [phone]: No answer   [BM]  2222 [phone]: No answer   [BM]  2223 [phone]: No answer   [BM]    Clinical Course User Index [BM] Gari Crown    Procedures  MDM   86 year old with a history of A. fib,  CAD, heart block with Saint Jude, dementia, hypertension, GERD, thoracic aortic aneurysm, CKD received at sign out from [facility] [place] pending CTA dissection study of the chest, abdomen, and pelvis.  Please see his note for further work-up and medical decision making.  I was also present when PA Athena Masse was speaking with the patient's son Ron.  We had a lengthy shared decision-making conversation that discussed the risk versus benefits of obtaining a CT study with contrast for further evaluation of the patient's known thoracic aneurysm.  We discussed that there is a chance that contrast could worsen the patient's kidney function.  We also discussed that if the study was obtained and surgery was indicated due to changes in the patient's thoracic aortic aneurysm that she would likely be a poor surgical candidate.  Dr. Dina Rich joined the conversation with the patient's son and also reiterated these risks and benefits.  Ron stated that he understood the risks and benefits of performing a contrasted CT study.  He agrees that the patient would not want surgery if needed, but he would appreciate the additional information that a contrasted CT study could provide.  He reports that her current CODE STATUS is  a partial code and the patient did not wish to have chest compressions if her status declined.  He would also like a call back when the results of the study were available.  Received a call from radiology regarding the patient's CTA study.  Descending intrathoracic aortic aneurysm measures a maximum of 6.8 x 6.3 cm with a chronic contained leak/hematoma.  Aneurysm is significantly increased from size from prior exam in 2008.  There is also a large amount of sigmoid and rectal stool.  I have personally called the patient's son, Ron, and conveyed these results.  We discussed that the aneurysm is significantly larger.  Discussed that with an aneurysm significantly larger that they can be at risk for spontaneously rupturing.   We also discussed to the increased mortality rate for spontaneous rupture of an aneurysm.  All questions were answered and the patient's son stated that he understood the increased risk.  He is aware that she will be transferred back to [facility].  She can be started on a bowel regimen for constipation.  She is hemodynamically stable and in no acute distress.  Safe for discharge to [facility] at this time.      Joanne Gavel, PA-C 06/15/19 0707    Joanne Gavel, PA-C 06/15/19 [id number]    Merryl Hacker, MD 06/15/19 [phone]

## 2019-06-14 NOTE — ED Notes (Signed)
Pt's son updated, requests update when pt's results come in. Ron [phone]

## 2019-06-15 ENCOUNTER — Encounter (HOSPITAL_COMMUNITY): Payer: Self-pay | Admitting: Radiology

## 2019-06-15 ENCOUNTER — Emergency Department (HOSPITAL_COMMUNITY): Payer: Medicare Other

## 2019-06-15 DIAGNOSIS — I711 Thoracic aortic aneurysm, ruptured: Secondary | ICD-10-CM | POA: Diagnosis not present

## 2019-06-15 DIAGNOSIS — I10 Essential (primary) hypertension: Secondary | ICD-10-CM | POA: Diagnosis not present

## 2019-06-15 DIAGNOSIS — Z743 Need for continuous supervision: Secondary | ICD-10-CM | POA: Diagnosis not present

## 2019-06-15 DIAGNOSIS — I712 Thoracic aortic aneurysm, without rupture: Secondary | ICD-10-CM | POA: Diagnosis not present

## 2019-06-15 DIAGNOSIS — R279 Unspecified lack of coordination: Secondary | ICD-10-CM | POA: Diagnosis not present

## 2019-06-15 MED ORDER — DIPHENHYDRAMINE HCL 50 MG/ML IJ SOLN
50.0000 mg | Freq: Once | INTRAMUSCULAR | Status: AC
  Administered 2019-06-15: 50 mg via INTRAVENOUS
  Filled 2019-06-15: qty 1

## 2019-06-15 MED ORDER — SODIUM CHLORIDE 0.9 % IV BOLUS
250.0000 mL | Freq: Once | INTRAVENOUS | Status: AC
  Administered 2019-06-15: 250 mL via INTRAVENOUS

## 2019-06-15 MED ORDER — DIPHENHYDRAMINE HCL 25 MG PO CAPS: 50.0000 mg | ORAL_CAPSULE | Freq: Once | ORAL | Status: AC

## 2019-06-15 MED ORDER — HYDROCORTISONE NA SUCCINATE PF 250 MG IJ SOLR
200.0000 mg | Freq: Once | INTRAMUSCULAR | Status: AC
  Administered 2019-06-15: 01:00:00 200 mg via INTRAVENOUS
  Filled 2019-06-15: qty 200

## 2019-06-15 MED ORDER — IOHEXOL 350 MG/ML SOLN
80.0000 mL | Freq: Once | INTRAVENOUS | Status: AC | PRN
  Administered 2019-06-15: 80 mL via INTRAVENOUS

## 2019-06-15 NOTE — ED Notes (Signed)
Pillow & warm blanket provided to pt, pt denies additional requests at this time

## 2019-06-15 NOTE — ED Notes (Signed)
Pt discharged at this time. Discharge instructions reviewed, opportunity to ask questions provided, pt verbalizes understanding of discharge instructions.  Report given to Graciela Husbands, RN at [facility] memory care unit, pt transported via [employer]

## 2019-06-15 NOTE — ED Notes (Signed)
This RN attempted to call son to inform him of pt's discharge, son did not answer. This RN left message & requested returned call for update.

## 2019-06-15 NOTE — ED Notes (Signed)
PTAR 

## 2019-06-15 NOTE — ED Notes (Signed)
Son at bedside currently, care team informing son of care plan/inquiring about son's/family's preferences

## 2019-06-15 NOTE — ED Notes (Signed)
Pt resting comfortably at this time.

## 2019-06-15 NOTE — Discharge Instructions (Addendum)
Thank you for allowing me to care for you today in the Emergency Department.   You have an enlarging thoracic aortic aneurysm that is greater than 6 cm in size.  It has been chronically leaking, but there was no acute rupture change today.  These findings were discussed with your son because it has grown much larger in size since 2008.  Your CT also demonstrated that you were constipated.  You can take MiraLAX or Dulcolax to help relieve constipation.  Return to the emergency department for new or worsening symptoms.

## 2019-06-15 NOTE — ED Notes (Signed)
This RN and Jeri NT attempted to place pt on bedpan. Pt unable to use bedpan at this time. Pt transported to CT via stretcher

## 2019-06-15 NOTE — ED Notes (Addendum)
Pt requested warm blanket; this RN provided the same. Dimmed lights in hallway for pt comfort. No additional needs expressed at this time.

## 2019-06-26 DIAGNOSIS — I10 Essential (primary) hypertension: Secondary | ICD-10-CM | POA: Diagnosis not present

## 2019-06-26 DIAGNOSIS — K5900 Constipation, unspecified: Secondary | ICD-10-CM | POA: Diagnosis not present

## 2019-06-26 DIAGNOSIS — Z7982 Long term (current) use of aspirin: Secondary | ICD-10-CM | POA: Diagnosis not present

## 2019-06-26 DIAGNOSIS — R933 Abnormal findings on diagnostic imaging of other parts of digestive tract: Secondary | ICD-10-CM | POA: Diagnosis not present

## 2019-06-26 DIAGNOSIS — I4891 Unspecified atrial fibrillation: Secondary | ICD-10-CM | POA: Diagnosis not present

## 2019-06-26 DIAGNOSIS — I712 Thoracic aortic aneurysm, without rupture: Secondary | ICD-10-CM | POA: Diagnosis not present

## 2019-06-27 ENCOUNTER — Ambulatory Visit (INDEPENDENT_AMBULATORY_CARE_PROVIDER_SITE_OTHER): Payer: Medicare Other | Admitting: *Deleted

## 2019-06-27 DIAGNOSIS — Z950 Presence of cardiac pacemaker: Secondary | ICD-10-CM | POA: Diagnosis not present

## 2019-06-27 LAB — CUP PACEART REMOTE DEVICE CHECK
Battery Remaining Longevity: 46 mo
Battery Remaining Percentage: 35 %
Battery Voltage: 2.83 V
Brady Statistic AP VP Percent: 4.2 %
Brady Statistic AP VS Percent: 5.4 %
Brady Statistic AS VP Percent: 53 %
Brady Statistic AS VS Percent: 37 %
Brady Statistic RA Percent Paced: 8.3 %
Brady Statistic RV Percent Paced: 58 %
Date Time Interrogation Session: 20210406023716
Implantable Lead Implant Date: 20131022
Implantable Lead Implant Date: 20131022
Implantable Lead Location: 753859
Implantable Lead Location: 753860
Implantable Lead Model: 1944
Implantable Lead Model: 1948
Implantable Pulse Generator Implant Date: 20131022
Lead Channel Impedance Value: 410 Ohm
Lead Channel Impedance Value: 510 Ohm
Lead Channel Pacing Threshold Amplitude: 0.5 V
Lead Channel Pacing Threshold Amplitude: 0.75 V
Lead Channel Pacing Threshold Pulse Width: 0.4 ms
Lead Channel Pacing Threshold Pulse Width: 0.4 ms
Lead Channel Sensing Intrinsic Amplitude: 5 mV
Lead Channel Sensing Intrinsic Amplitude: 6 mV
Lead Channel Setting Pacing Amplitude: 1 V
Lead Channel Setting Pacing Amplitude: 1.5 V
Lead Channel Setting Pacing Pulse Width: 0.4 ms
Lead Channel Setting Sensing Sensitivity: 2 mV
Pulse Gen Model: 2210
Pulse Gen Serial Number: 7393244

## 2019-06-28 NOTE — Progress Notes (Signed)
PPM Remote  

## 2019-06-30 DIAGNOSIS — F062 Psychotic disorder with delusions due to known physiological condition: Secondary | ICD-10-CM | POA: Diagnosis not present

## 2019-06-30 DIAGNOSIS — G301 Alzheimer's disease with late onset: Secondary | ICD-10-CM | POA: Diagnosis not present

## 2019-06-30 DIAGNOSIS — F331 Major depressive disorder, recurrent, moderate: Secondary | ICD-10-CM | POA: Diagnosis not present

## 2019-06-30 DIAGNOSIS — F0281 Dementia in other diseases classified elsewhere with behavioral disturbance: Secondary | ICD-10-CM | POA: Diagnosis not present

## 2019-07-17 DIAGNOSIS — I712 Thoracic aortic aneurysm, without rupture: Secondary | ICD-10-CM | POA: Diagnosis not present

## 2019-07-17 DIAGNOSIS — K5900 Constipation, unspecified: Secondary | ICD-10-CM | POA: Diagnosis not present

## 2019-07-17 DIAGNOSIS — Z7982 Long term (current) use of aspirin: Secondary | ICD-10-CM | POA: Diagnosis not present

## 2019-07-17 DIAGNOSIS — I4891 Unspecified atrial fibrillation: Secondary | ICD-10-CM | POA: Diagnosis not present

## 2019-07-17 DIAGNOSIS — I10 Essential (primary) hypertension: Secondary | ICD-10-CM | POA: Diagnosis not present

## 2019-07-28 DIAGNOSIS — F331 Major depressive disorder, recurrent, moderate: Secondary | ICD-10-CM | POA: Diagnosis not present

## 2019-07-28 DIAGNOSIS — G301 Alzheimer's disease with late onset: Secondary | ICD-10-CM | POA: Diagnosis not present

## 2019-07-28 DIAGNOSIS — F0281 Dementia in other diseases classified elsewhere with behavioral disturbance: Secondary | ICD-10-CM | POA: Diagnosis not present

## 2019-07-28 DIAGNOSIS — F062 Psychotic disorder with delusions due to known physiological condition: Secondary | ICD-10-CM | POA: Diagnosis not present

## 2019-08-16 DIAGNOSIS — Z79899 Other long term (current) drug therapy: Secondary | ICD-10-CM | POA: Diagnosis not present

## 2019-08-16 DIAGNOSIS — I10 Essential (primary) hypertension: Secondary | ICD-10-CM | POA: Diagnosis not present

## 2019-08-16 DIAGNOSIS — E782 Mixed hyperlipidemia: Secondary | ICD-10-CM | POA: Diagnosis not present

## 2019-08-16 DIAGNOSIS — I4891 Unspecified atrial fibrillation: Secondary | ICD-10-CM | POA: Diagnosis not present

## 2019-08-25 DIAGNOSIS — F062 Psychotic disorder with delusions due to known physiological condition: Secondary | ICD-10-CM | POA: Diagnosis not present

## 2019-08-25 DIAGNOSIS — F0281 Dementia in other diseases classified elsewhere with behavioral disturbance: Secondary | ICD-10-CM | POA: Diagnosis not present

## 2019-08-25 DIAGNOSIS — F331 Major depressive disorder, recurrent, moderate: Secondary | ICD-10-CM | POA: Diagnosis not present

## 2019-08-25 DIAGNOSIS — G301 Alzheimer's disease with late onset: Secondary | ICD-10-CM | POA: Diagnosis not present

## 2019-08-31 DIAGNOSIS — L97521 Non-pressure chronic ulcer of other part of left foot limited to breakdown of skin: Secondary | ICD-10-CM | POA: Diagnosis not present

## 2019-09-26 ENCOUNTER — Ambulatory Visit (INDEPENDENT_AMBULATORY_CARE_PROVIDER_SITE_OTHER): Payer: Medicare Other | Admitting: *Deleted

## 2019-09-26 DIAGNOSIS — I441 Atrioventricular block, second degree: Secondary | ICD-10-CM | POA: Diagnosis not present

## 2019-09-26 LAB — CUP PACEART REMOTE DEVICE CHECK
Battery Remaining Longevity: 40 mo
Battery Remaining Percentage: 29 %
Battery Voltage: 2.81 V
Brady Statistic AP VP Percent: 4.9 %
Brady Statistic AP VS Percent: 4.6 %
Brady Statistic AS VP Percent: 58 %
Brady Statistic AS VS Percent: 32 %
Brady Statistic RA Percent Paced: 8.3 %
Brady Statistic RV Percent Paced: 64 %
Date Time Interrogation Session: 20210706051130
Implantable Lead Implant Date: 20131022
Implantable Lead Implant Date: 20131022
Implantable Lead Location: 753859
Implantable Lead Location: 753860
Implantable Lead Model: 1944
Implantable Lead Model: 1948
Implantable Pulse Generator Implant Date: 20131022
Lead Channel Impedance Value: 430 Ohm
Lead Channel Impedance Value: 560 Ohm
Lead Channel Pacing Threshold Amplitude: 0.375 V
Lead Channel Pacing Threshold Amplitude: 0.75 V
Lead Channel Pacing Threshold Pulse Width: 0.4 ms
Lead Channel Pacing Threshold Pulse Width: 0.4 ms
Lead Channel Sensing Intrinsic Amplitude: 12 mV
Lead Channel Sensing Intrinsic Amplitude: 5 mV
Lead Channel Setting Pacing Amplitude: 1 V
Lead Channel Setting Pacing Amplitude: 1.375
Lead Channel Setting Pacing Pulse Width: 0.4 ms
Lead Channel Setting Sensing Sensitivity: 2 mV
Pulse Gen Model: 2210
Pulse Gen Serial Number: 7393244

## 2019-09-27 NOTE — Progress Notes (Signed)
Remote pacemaker transmission.   

## 2019-09-30 DIAGNOSIS — L97521 Non-pressure chronic ulcer of other part of left foot limited to breakdown of skin: Secondary | ICD-10-CM | POA: Diagnosis not present

## 2019-10-16 DIAGNOSIS — G301 Alzheimer's disease with late onset: Secondary | ICD-10-CM | POA: Diagnosis not present

## 2019-10-16 DIAGNOSIS — F0281 Dementia in other diseases classified elsewhere with behavioral disturbance: Secondary | ICD-10-CM | POA: Diagnosis not present

## 2019-10-16 DIAGNOSIS — M810 Age-related osteoporosis without current pathological fracture: Secondary | ICD-10-CM | POA: Diagnosis not present

## 2019-10-16 DIAGNOSIS — I482 Chronic atrial fibrillation, unspecified: Secondary | ICD-10-CM | POA: Diagnosis not present

## 2019-10-16 DIAGNOSIS — K5901 Slow transit constipation: Secondary | ICD-10-CM | POA: Diagnosis not present

## 2019-10-16 DIAGNOSIS — I10 Essential (primary) hypertension: Secondary | ICD-10-CM | POA: Diagnosis not present

## 2019-10-20 DIAGNOSIS — F331 Major depressive disorder, recurrent, moderate: Secondary | ICD-10-CM | POA: Diagnosis not present

## 2019-10-20 DIAGNOSIS — G301 Alzheimer's disease with late onset: Secondary | ICD-10-CM | POA: Diagnosis not present

## 2019-10-20 DIAGNOSIS — F062 Psychotic disorder with delusions due to known physiological condition: Secondary | ICD-10-CM | POA: Diagnosis not present

## 2019-10-20 DIAGNOSIS — F0281 Dementia in other diseases classified elsewhere with behavioral disturbance: Secondary | ICD-10-CM | POA: Diagnosis not present

## 2019-11-06 DIAGNOSIS — R2231 Localized swelling, mass and lump, right upper limb: Secondary | ICD-10-CM | POA: Diagnosis not present

## 2019-11-07 DIAGNOSIS — D487 Neoplasm of uncertain behavior of other specified sites: Secondary | ICD-10-CM | POA: Diagnosis not present

## 2019-11-08 DIAGNOSIS — Z4800 Encounter for change or removal of nonsurgical wound dressing: Secondary | ICD-10-CM | POA: Diagnosis not present

## 2019-11-08 DIAGNOSIS — I739 Peripheral vascular disease, unspecified: Secondary | ICD-10-CM | POA: Diagnosis not present

## 2019-11-08 DIAGNOSIS — L97521 Non-pressure chronic ulcer of other part of left foot limited to breakdown of skin: Secondary | ICD-10-CM | POA: Diagnosis not present

## 2019-11-17 DIAGNOSIS — F331 Major depressive disorder, recurrent, moderate: Secondary | ICD-10-CM | POA: Diagnosis not present

## 2019-11-17 DIAGNOSIS — G301 Alzheimer's disease with late onset: Secondary | ICD-10-CM | POA: Diagnosis not present

## 2019-11-17 DIAGNOSIS — F0281 Dementia in other diseases classified elsewhere with behavioral disturbance: Secondary | ICD-10-CM | POA: Diagnosis not present

## 2019-11-17 DIAGNOSIS — F062 Psychotic disorder with delusions due to known physiological condition: Secondary | ICD-10-CM | POA: Diagnosis not present

## 2019-12-10 ENCOUNTER — Emergency Department (HOSPITAL_COMMUNITY): Payer: Medicare Other

## 2019-12-10 ENCOUNTER — Encounter (HOSPITAL_COMMUNITY): Payer: Self-pay | Admitting: Emergency Medicine

## 2019-12-10 ENCOUNTER — Emergency Department (HOSPITAL_COMMUNITY)
Admission: EM | Admit: 2019-12-10 | Discharge: 2019-12-11 | Disposition: A | Discharge status: Home / Self Care | Payer: Medicare Other | Attending: Emergency Medicine | Admitting: Emergency Medicine

## 2019-12-10 ENCOUNTER — Other Ambulatory Visit: Payer: Self-pay

## 2019-12-10 DIAGNOSIS — M79605 Pain in left leg: Secondary | ICD-10-CM | POA: Diagnosis not present

## 2019-12-10 DIAGNOSIS — Z7982 Long term (current) use of aspirin: Secondary | ICD-10-CM | POA: Diagnosis not present

## 2019-12-10 DIAGNOSIS — I10 Essential (primary) hypertension: Secondary | ICD-10-CM | POA: Diagnosis not present

## 2019-12-10 DIAGNOSIS — I129 Hypertensive chronic kidney disease with stage 1 through stage 4 chronic kidney disease, or unspecified chronic kidney disease: Secondary | ICD-10-CM | POA: Diagnosis not present

## 2019-12-10 DIAGNOSIS — F0390 Unspecified dementia without behavioral disturbance: Secondary | ICD-10-CM | POA: Insufficient documentation

## 2019-12-10 DIAGNOSIS — R52 Pain, unspecified: Secondary | ICD-10-CM

## 2019-12-10 DIAGNOSIS — Z96642 Presence of left artificial hip joint: Secondary | ICD-10-CM | POA: Diagnosis not present

## 2019-12-10 DIAGNOSIS — R262 Difficulty in walking, not elsewhere classified: Secondary | ICD-10-CM | POA: Diagnosis not present

## 2019-12-10 DIAGNOSIS — Z951 Presence of aortocoronary bypass graft: Secondary | ICD-10-CM | POA: Diagnosis not present

## 2019-12-10 DIAGNOSIS — Z950 Presence of cardiac pacemaker: Secondary | ICD-10-CM | POA: Insufficient documentation

## 2019-12-10 DIAGNOSIS — N1832 Chronic kidney disease, stage 3b: Secondary | ICD-10-CM | POA: Insufficient documentation

## 2019-12-10 DIAGNOSIS — R509 Fever, unspecified: Secondary | ICD-10-CM | POA: Diagnosis not present

## 2019-12-10 DIAGNOSIS — R0689 Other abnormalities of breathing: Secondary | ICD-10-CM | POA: Diagnosis not present

## 2019-12-10 DIAGNOSIS — I2510 Atherosclerotic heart disease of native coronary artery without angina pectoris: Secondary | ICD-10-CM | POA: Diagnosis not present

## 2019-12-10 DIAGNOSIS — R5381 Other malaise: Secondary | ICD-10-CM | POA: Diagnosis not present

## 2019-12-10 DIAGNOSIS — M1611 Unilateral primary osteoarthritis, right hip: Secondary | ICD-10-CM | POA: Diagnosis not present

## 2019-12-10 MED ORDER — ACETAMINOPHEN 500 MG PO TABS
1000.0000 mg | ORAL_TABLET | Freq: Once | ORAL | Status: AC
  Administered 2019-12-11: 1000 mg via ORAL
  Filled 2019-12-10: qty 2

## 2019-12-10 NOTE — ED Triage Notes (Signed)
Per ems, pt coming from [place]. Facility reports past 3 weeks pt has been walking with a limp/favoring her left leg and today started having extreme leg pain with groaning. Pt at baseline has dementia and states "yes" repeatedly. No falls/blood thinners. EMS noted pt having a 35-45 second episode of wide complexes on monitor. Pt has Ventricular pacemaker. EMS BP 170/92, CBG 109, ETCO2 33.

## 2019-12-10 NOTE — ED Notes (Signed)
Pt transported to Xray. 

## 2019-12-10 NOTE — ED Provider Notes (Signed)
[facility] EMERGENCY DEPARTMENT Provider Note   CSN: [id number] Arrival date & time: 12/10/19  2257     History Chief Complaint  Patient presents with  . Leg Pain    [name] is a 86 y.o. female.  86 yo F with a chief complaint of leg pain.  This is been going on for a couple days.  No obvious fall.  Complaining of pain to the left leg.  Seems occur off and on.  Patient is demented and unable to provide any history.  Level 5 caveat.  Reportedly EMS thought she was warm and noted that she has a history of prior urinary tract infection.-  The history is provided by the patient, the nursing home and the EMS personnel.  Illness Severity:  Moderate Onset quality:  Gradual Duration:  2 days Timing:  Constant Progression:  Worsening Chronicity:  New Associated symptoms: myalgias   Associated symptoms: no chest pain, no congestion, no fever, no headaches, no nausea, no rhinorrhea, no shortness of breath, no vomiting and no wheezing        Past Medical History:  Diagnosis Date  . Anemia, unspecified   . Anxiety state, unspecified   . Atrial fibrillation (HCC)    not anticoagulation candidate due to falls  . CAD (coronary artery disease)    s/p CABG  . Carotid artery stenosis   . Complete heart block ([place]) 12/2011   s/p St.Jude dual chamber PPM 01/12/12  . Dementia ([place])   . Dementia ([place]) 05/09/2017  . Diverticulosis of colon (without mention of hemorrhage)   . Esophageal reflux   . HTN (hypertension)   . Hyperlipidemia   . Osteoarthrosis, unspecified whether generalized or localized, unspecified site   . Osteoporosis, unspecified   . Pacemaker-St.Jude 01/13/2012  . S/P AVR (aortic valve replacement) 2003   bioprosthetic   . Thoracic aortic aneurysm ([place]) 2003   s/p repair  . Unspecified chronic bronchitis [facility])     Patient Active Problem List   Diagnosis Date Noted  . Hematuria 09/20/2018  . Bilateral leg edema 09/20/2018  . Prediabetes  09/09/2016  . CKD (chronic kidney disease) stage 3, GFR 30-59 ml/min 09/10/2015  . Arthritis of right hip, severe 09/10/2015  . Pacemaker-St.Jude 01/13/2012  . Second degree Mobitz II AV block 01/11/2012  . CAROTID STENOSIS 11/29/2008  . Hearing loss 10/12/2008  . Osteoporosis 05/08/2008  . AORTIC VALVE REPLACEMENT, HX OF 05/08/2008  . Hyperlipidemia 04/13/2007  . Mild dementia ([place]) 04/13/2007  . Coronary atherosclerosis 04/13/2007  . Paroxysmal atrial fibrillation ([place]) 04/13/2007  . DIVERTICULOSIS OF COLON 04/13/2007  . Anxiety state 01/18/2007  . Essential hypertension 01/18/2007    Past Surgical History:  Procedure Laterality Date  . AORTIC VALVE REPLACEMENT  2003  . CATARACT EXTRACTION     bilateral  . CORONARY ARTERY BYPASS GRAFT    . INTRAOCULAR LENS INSERTION     bilateral  . PACEMAKER INSERTION  01/12/12   SJM Accent DR RF implanted by Dr Rayann Heman for CHB  . PERMANENT PACEMAKER INSERTION N/A 01/12/2012   Procedure: PERMANENT PACEMAKER INSERTION;  Surgeon: Thompson Grayer, MD;  Location: [facility] CATH LAB;  Service: Cardiovascular;  Laterality: N/A;  . [place]   left  . VESICOVAGINAL FISTULA CLOSURE W/ TAH       OB History   No obstetric history on file.     Family History  Problem Relation Age of Onset  . Diabetes Brother   . Diabetes  Sister     Social History   Tobacco Use  . Smoking status: Never Smoker  . Smokeless tobacco: Never Used  Vaping Use  . Vaping Use: Never used  Substance Use Topics  . Alcohol use: No  . Drug use: No    Home Medications Prior to Admission medications   Medication Sig Start Date End Date Taking? Authorizing Provider  acetaminophen (TYLENOL) 325 MG tablet Take 650 mg by mouth every 6 (six) hours as needed (pain).     [provider]  alendronate (FOSAMAX) 70 MG tablet TAKE 1 TABLET BY MOUTH EVERY 7 DAYS, TAKE WITH A FULL GLASS OF WATER ON AN EMPTY STOMACH 11/18/18   Binnie Rail, MD  aspirin EC  81 MG tablet Take 1 tablet (81 mg total) by mouth daily. 03/21/18   Lelon Perla, MD  cholecalciferol (VITAMIN D) 1000 UNITS tablet Take 1,000 Units by mouth daily with breakfast.     [provider]  citalopram (CELEXA) 20 MG tablet TAKE 1 TABLET BY MOUTH ONCE DAILY AT  6PM 08/17/18   Binnie Rail, MD  folic acid (FOLVITE) 1 MG tablet Take 1 tablet (1 mg total) by mouth daily. 11/16/18   Binnie Rail, MD  furosemide (LASIX) 40 MG tablet Take 2 tablets by mouth once daily 11/15/18   Binnie Rail, MD  Lidocaine HCl (LIDOCAINE PLUS) 4 % CREA Apply 1 application topically 3 (three) times daily.    [provider]  nystatin (MYCOSTATIN/NYSTOP) powder Apply topically 4 (four) times daily. 06/08/18   Binnie Rail, MD  Petrolatum-Zinc Oxide 49-15 % OINT Apply to affected area often until rash has resolved, then d/c 12/09/18   Binnie Rail, MD  pravastatin (PRAVACHOL) 40 MG tablet Take 1 tablet (40 mg total) by mouth at bedtime. 06/30/18   Binnie Rail, MD  risperiDONE (RISPERDAL) 0.25 MG tablet Take 1 tablet (0.25 mg total) by mouth at bedtime. 06/30/18   Binnie Rail, MD    Allergies    Coumadin [warfarin sodium], Clarithromycin, Iohexol, Nsaids, Penicillins, Prednisone, Statins, and Sulfonamide derivatives  Review of Systems   Review of Systems  Unable to perform ROS: Dementia  Constitutional: Negative for chills and fever.  HENT: Negative for congestion and rhinorrhea.   Eyes: Negative for redness and visual disturbance.  Respiratory: Negative for shortness of breath and wheezing.   Cardiovascular: Negative for chest pain and palpitations.  Gastrointestinal: Negative for nausea and vomiting.  Genitourinary: Negative for dysuria and urgency.  Musculoskeletal: Positive for arthralgias and myalgias.  Skin: Negative for pallor and wound.  Neurological: Negative for dizziness and headaches.    Physical Exam Updated Vital Signs BP (!) 164/88 (BP Location: Right Arm)    Pulse 68   Temp 98.9 F (37.2 C) (Oral)   Resp 18   SpO2 96%   Physical Exam Vitals and nursing note reviewed.  Constitutional:      General: She is not in acute distress.    Appearance: She is well-developed. She is not diaphoretic.  HENT:     Head: Normocephalic and atraumatic.  Eyes:     Pupils: Pupils are equal, round, and reactive to light.  Cardiovascular:     Rate and Rhythm: Normal rate and regular rhythm.     Heart sounds: No murmur heard.  No friction rub. No gallop.   Pulmonary:     Effort: Pulmonary effort is normal.     Breath sounds: No wheezing or rales.  Abdominal:     General: There is no distension.     Palpations: Abdomen is soft.     Tenderness: There is no abdominal tenderness.  Musculoskeletal:        General: No tenderness.     Cervical back: Normal range of motion and neck supple.     Comments: Left lower extremity is longer than the right.  Has full range of motion at the hip.  Pulses intact bilaterally.  Difficulty to assist sensation as the patient is severely demented.  Does seem to move both feet in all nerve distributions.  Skin:    General: Skin is warm and dry.  Neurological:     Mental Status: She is alert and oriented to person, place, and time.  Psychiatric:        Behavior: Behavior normal.     ED Results / Procedures / Treatments   Labs (all labs ordered are listed, but only abnormal results are displayed) Labs Reviewed - No data to display  EKG EKG Interpretation  Date/Time:  Sunday December 10 2019 23:23:28 EDT Ventricular Rate:  66 PR Interval:    QRS Duration: 153 QT Interval:  490 QTC Calculation: 514 R Axis:   -67 Text Interpretation: Sinus or ectopic atrial rhythm Left bundle branch block No significant change since last tracing Confirmed by ,  ([id number]) on 12/10/2019 11:52:36 PM   Radiology DG HIP UNILAT WITH PELVIS 2-3 VIEWS LEFT  Result Date: 12/11/2019 CLINICAL DATA:  Difficulty ambulating EXAM: DG HIP  (WITH OR WITHOUT PELVIS) 3V LEFT COMPARISON:  None. FINDINGS: Pelvic ring is intact. Left hip prosthesis is noted in satisfactory position. No dislocation is seen. No periprosthetic fracture is noted. No soft tissue abnormality is seen. IMPRESSION: No acute abnormality is noted. Electronically Signed   By: Mark  Lukens M.D.   On: 12/11/2019 00:10   DG HIP UNILAT WITH PELVIS 2-3 VIEWS RIGHT  Result Date: 12/11/2019 CLINICAL DATA:  Right hip pain EXAM: DG HIP (WITH OR WITHOUT PELVIS) 2-3V RIGHT COMPARISON:  None. FINDINGS: Significant degenerative changes of the right hip joint are noted with remodeling of the femoral head. This has progressed in the interval from a prior CT examination dated 06/14/2019. No acute fracture or dislocation is seen. Pelvic ring is intact. IMPRESSION: Severe degenerative changes of the right hip joint with remodeling of the right femoral head. Electronically Signed   By: Mark  Lukens M.D.   On: 12/11/2019 00:11    Procedures Procedures (including critical care time)  Medications Ordered in ED Medications  acetaminophen (TYLENOL) tablet 1,000 mg (1,000 mg Oral Given 12/11/19 0017)    ED Course  I have reviewed the triage vital signs and the nursing notes.  Pertinent labs & imaging results that were available during my care of the patient were reviewed by me and considered in my medical decision making (see chart for details).    MDM Rules/Calculators/A&P                          86  yo F with a chief complaints of leg pain.  Reportedly going on for a couple days.  Difficult to obtain history due to severe dementia.  Patient was most recently seen in the ED about 6 months ago and was found to have a thoracic aortic aneurysm with leak.  At that time there was no plan for surgical intervention.  She has pulses to bilateral lower extremities.  They are warm and well  perfused.  She has no signs of hematoma to the abdomen or the flank.  She appears in no discomfort.  Will  obtain a plain film of both hips as she has a leg length discrepancy.  Reassess.  Significant degeneration of the R hip.  Reportedly having pain on the other side. Not read as fx by rads.  D/c home.   12:17 AM:  I have discussed the diagnosis/risks/treatment options with the patient and believe the pt to be eligible for discharge home to follow-up with PCP. We also discussed returning to the ED immediately if new or worsening sx occur. We discussed the sx which are most concerning (e.g., sudden worsening pain, fever, inability to tolerate by mouth) that necessitate immediate return. Medications administered to the patient during their visit and any new prescriptions provided to the patient are listed below.  Medications given during this visit Medications  acetaminophen (TYLENOL) tablet 1,000 mg (1,000 mg Oral Given 12/11/19 0017)     The patient appears reasonably screen and/or stabilized for discharge and I doubt any other medical condition or other [facility] requiring further screening, evaluation, or treatment in the ED at this time prior to discharge.   Final Clinical Impression(s) / ED Diagnoses Final diagnoses:  Left leg pain    Rx / DC Orders ED Discharge Orders    None       Deno Etienne, DO 12/11/19 0017

## 2019-12-11 DIAGNOSIS — M1611 Unilateral primary osteoarthritis, right hip: Secondary | ICD-10-CM | POA: Diagnosis not present

## 2019-12-11 DIAGNOSIS — Z743 Need for continuous supervision: Secondary | ICD-10-CM | POA: Diagnosis not present

## 2019-12-11 DIAGNOSIS — R262 Difficulty in walking, not elsewhere classified: Secondary | ICD-10-CM | POA: Diagnosis not present

## 2019-12-11 DIAGNOSIS — Z96642 Presence of left artificial hip joint: Secondary | ICD-10-CM | POA: Diagnosis not present

## 2019-12-11 DIAGNOSIS — R279 Unspecified lack of coordination: Secondary | ICD-10-CM | POA: Diagnosis not present

## 2019-12-11 DIAGNOSIS — R52 Pain, unspecified: Secondary | ICD-10-CM | POA: Diagnosis not present

## 2019-12-11 DIAGNOSIS — I4891 Unspecified atrial fibrillation: Secondary | ICD-10-CM | POA: Diagnosis not present

## 2019-12-11 DIAGNOSIS — I10 Essential (primary) hypertension: Secondary | ICD-10-CM | POA: Diagnosis not present

## 2019-12-11 MED ORDER — ACETAMINOPHEN 500 MG PO TABS: 1000.0000 mg | ORAL_TABLET | Freq: Four times a day (QID) | ORAL | Status: DC | PRN

## 2019-12-11 NOTE — Discharge Instructions (Signed)
Your xray did not show any obvious broken bones.  If there is further concern your PCP could order a CT scan.  Return for worsening or uncontrolled pain.

## 2019-12-12 DIAGNOSIS — M810 Age-related osteoporosis without current pathological fracture: Secondary | ICD-10-CM | POA: Diagnosis not present

## 2019-12-12 DIAGNOSIS — K5901 Slow transit constipation: Secondary | ICD-10-CM | POA: Diagnosis not present

## 2019-12-12 DIAGNOSIS — F0281 Dementia in other diseases classified elsewhere with behavioral disturbance: Secondary | ICD-10-CM | POA: Diagnosis not present

## 2019-12-12 DIAGNOSIS — Z09 Encounter for follow-up examination after completed treatment for conditions other than malignant neoplasm: Secondary | ICD-10-CM | POA: Diagnosis not present

## 2019-12-12 DIAGNOSIS — I10 Essential (primary) hypertension: Secondary | ICD-10-CM | POA: Diagnosis not present

## 2019-12-12 DIAGNOSIS — G301 Alzheimer's disease with late onset: Secondary | ICD-10-CM | POA: Diagnosis not present

## 2019-12-15 DIAGNOSIS — F0281 Dementia in other diseases classified elsewhere with behavioral disturbance: Secondary | ICD-10-CM | POA: Diagnosis not present

## 2019-12-15 DIAGNOSIS — F331 Major depressive disorder, recurrent, moderate: Secondary | ICD-10-CM | POA: Diagnosis not present

## 2019-12-15 DIAGNOSIS — G301 Alzheimer's disease with late onset: Secondary | ICD-10-CM | POA: Diagnosis not present

## 2019-12-15 DIAGNOSIS — F062 Psychotic disorder with delusions due to known physiological condition: Secondary | ICD-10-CM | POA: Diagnosis not present

## 2019-12-26 ENCOUNTER — Ambulatory Visit (INDEPENDENT_AMBULATORY_CARE_PROVIDER_SITE_OTHER): Payer: Medicare Other

## 2019-12-26 ENCOUNTER — Other Ambulatory Visit: Payer: Self-pay

## 2019-12-26 ENCOUNTER — Emergency Department (HOSPITAL_COMMUNITY): Payer: Medicare Other

## 2019-12-26 ENCOUNTER — Encounter (HOSPITAL_COMMUNITY): Payer: Self-pay | Admitting: Emergency Medicine

## 2019-12-26 ENCOUNTER — Emergency Department (HOSPITAL_COMMUNITY)
Admission: EM | Admit: 2019-12-26 | Discharge: 2019-12-27 | Disposition: A | Discharge status: Skilled Nursing Facility | Payer: Medicare Other | Attending: Emergency Medicine | Admitting: Emergency Medicine

## 2019-12-26 DIAGNOSIS — F0390 Unspecified dementia without behavioral disturbance: Secondary | ICD-10-CM | POA: Insufficient documentation

## 2019-12-26 DIAGNOSIS — I1310 Hypertensive heart and chronic kidney disease without heart failure, with stage 1 through stage 4 chronic kidney disease, or unspecified chronic kidney disease: Secondary | ICD-10-CM | POA: Diagnosis not present

## 2019-12-26 DIAGNOSIS — I441 Atrioventricular block, second degree: Secondary | ICD-10-CM

## 2019-12-26 DIAGNOSIS — S0990XA Unspecified injury of head, initial encounter: Secondary | ICD-10-CM | POA: Insufficient documentation

## 2019-12-26 DIAGNOSIS — Z79899 Other long term (current) drug therapy: Secondary | ICD-10-CM | POA: Diagnosis not present

## 2019-12-26 DIAGNOSIS — Z96642 Presence of left artificial hip joint: Secondary | ICD-10-CM | POA: Diagnosis not present

## 2019-12-26 DIAGNOSIS — N183 Chronic kidney disease, stage 3 unspecified: Secondary | ICD-10-CM | POA: Diagnosis not present

## 2019-12-26 DIAGNOSIS — I719 Aortic aneurysm of unspecified site, without rupture: Secondary | ICD-10-CM | POA: Insufficient documentation

## 2019-12-26 DIAGNOSIS — Z950 Presence of cardiac pacemaker: Secondary | ICD-10-CM | POA: Insufficient documentation

## 2019-12-26 DIAGNOSIS — I25810 Atherosclerosis of coronary artery bypass graft(s) without angina pectoris: Secondary | ICD-10-CM | POA: Diagnosis not present

## 2019-12-26 DIAGNOSIS — W19XXXA Unspecified fall, initial encounter: Secondary | ICD-10-CM

## 2019-12-26 DIAGNOSIS — R410 Disorientation, unspecified: Secondary | ICD-10-CM | POA: Diagnosis not present

## 2019-12-26 DIAGNOSIS — Z7982 Long term (current) use of aspirin: Secondary | ICD-10-CM | POA: Diagnosis not present

## 2019-12-26 DIAGNOSIS — W06XXXA Fall from bed, initial encounter: Secondary | ICD-10-CM | POA: Insufficient documentation

## 2019-12-26 DIAGNOSIS — I10 Essential (primary) hypertension: Secondary | ICD-10-CM | POA: Diagnosis not present

## 2019-12-26 DIAGNOSIS — Z043 Encounter for examination and observation following other accident: Secondary | ICD-10-CM | POA: Diagnosis not present

## 2019-12-26 DIAGNOSIS — R404 Transient alteration of awareness: Secondary | ICD-10-CM | POA: Diagnosis not present

## 2019-12-26 NOTE — ED Provider Notes (Signed)
[facility] DEPT Provider Note   CSN: [id number] Arrival date & time: 12/26/19  2313     History Chief Complaint  Patient presents with  . Fall    [name] is a 86 y.o. female.  Patient presents to the emergency department with a chief complaint of fall.  She has history of dementia.  She is not able to contribute to her history because of the dementia.  She is from a skilled nursing facility.  Reportedly, staff heard her yelling from down the hall.  They found her on the ground adjacent to her bed.  Presumed fall from bed.  Level five caveat applies secondary to dementia.  The history is provided by the patient. No language interpreter was used.       Past Medical History:  Diagnosis Date  . Anemia, unspecified   . Anxiety state, unspecified   . Atrial fibrillation (HCC)    not anticoagulation candidate due to falls  . CAD (coronary artery disease)    s/p CABG  . Carotid artery stenosis   . Complete heart block ([place]) 12/2011   s/p St.Jude dual chamber PPM 01/12/12  . Dementia ([place])   . Dementia ([place]) 05/09/2017  . Diverticulosis of colon (without mention of hemorrhage)   . Esophageal reflux   . HTN (hypertension)   . Hyperlipidemia   . Osteoarthrosis, unspecified whether generalized or localized, unspecified site   . Osteoporosis, unspecified   . Pacemaker-St.Jude 01/13/2012  . S/P AVR (aortic valve replacement) 2003   bioprosthetic   . Thoracic aortic aneurysm ([place]) 2003   s/p repair  . Unspecified chronic bronchitis [facility])     Patient Active Problem List   Diagnosis Date Noted  . Hematuria 09/20/2018  . Bilateral leg edema 09/20/2018  . Prediabetes 09/09/2016  . CKD (chronic kidney disease) stage 3, GFR 30-59 ml/min (HCC) 09/10/2015  . Arthritis of right hip, severe 09/10/2015  . Pacemaker-St.Jude 01/13/2012  . Second degree Mobitz II AV block 01/11/2012  . CAROTID STENOSIS 11/29/2008  . Hearing loss 10/12/2008  .  Osteoporosis 05/08/2008  . AORTIC VALVE REPLACEMENT, HX OF 05/08/2008  . Hyperlipidemia 04/13/2007  . Mild dementia ([place]) 04/13/2007  . Coronary atherosclerosis 04/13/2007  . Paroxysmal atrial fibrillation ([place]) 04/13/2007  . DIVERTICULOSIS OF COLON 04/13/2007  . Anxiety state 01/18/2007  . Essential hypertension 01/18/2007    Past Surgical History:  Procedure Laterality Date  . AORTIC VALVE REPLACEMENT  2003  . CATARACT EXTRACTION     bilateral  . CORONARY ARTERY BYPASS GRAFT    . INTRAOCULAR LENS INSERTION     bilateral  . PACEMAKER INSERTION  01/12/12   SJM Accent DR RF implanted by Dr Rayann Heman for CHB  . PERMANENT PACEMAKER INSERTION N/A 01/12/2012   Procedure: PERMANENT PACEMAKER INSERTION;  Surgeon: Thompson Grayer, MD;  Location: [facility] CATH LAB;  Service: Cardiovascular;  Laterality: N/A;  . [place]   left  . VESICOVAGINAL FISTULA CLOSURE W/ TAH       OB History   No obstetric history on file.     Family History  Problem Relation Age of Onset  . Diabetes Brother   . Diabetes Sister     Social History   Tobacco Use  . Smoking status: Never Smoker  . Smokeless tobacco: Never Used  Vaping Use  . Vaping Use: Never used  Substance Use Topics  . Alcohol use: No  . Drug use: No    Home Medications Prior  to Admission medications   Medication Sig Start Date End Date Taking? Authorizing Provider  acetaminophen (TYLENOL) 325 MG tablet Take 650 mg by mouth every 6 (six) hours as needed (pain).     [provider]  alendronate (FOSAMAX) 70 MG tablet TAKE 1 TABLET BY MOUTH EVERY 7 DAYS, TAKE WITH A FULL GLASS OF WATER ON AN EMPTY STOMACH 11/18/18   Binnie Rail, MD  aspirin EC 81 MG tablet Take 1 tablet (81 mg total) by mouth daily. 03/21/18   Lelon Perla, MD  cholecalciferol (VITAMIN D) 1000 UNITS tablet Take 1,000 Units by mouth daily with breakfast.     [provider]  citalopram (CELEXA) 20 MG tablet TAKE 1 TABLET BY MOUTH  ONCE DAILY AT  6PM 08/17/18   Binnie Rail, MD  folic acid (FOLVITE) 1 MG tablet Take 1 tablet (1 mg total) by mouth daily. 11/16/18   Binnie Rail, MD  furosemide (LASIX) 40 MG tablet Take 2 tablets by mouth once daily 11/15/18   Binnie Rail, MD  Lidocaine HCl (LIDOCAINE PLUS) 4 % CREA Apply 1 application topically 3 (three) times daily.    [provider]  nystatin (MYCOSTATIN/NYSTOP) powder Apply topically 4 (four) times daily. 06/08/18   Binnie Rail, MD  Petrolatum-Zinc Oxide 49-15 % OINT Apply to affected area often until rash has resolved, then d/c 12/09/18   Binnie Rail, MD  pravastatin (PRAVACHOL) 40 MG tablet Take 1 tablet (40 mg total) by mouth at bedtime. 06/30/18   Binnie Rail, MD  risperiDONE (RISPERDAL) 0.25 MG tablet Take 1 tablet (0.25 mg total) by mouth at bedtime. 06/30/18   Binnie Rail, MD    Allergies    Coumadin [warfarin sodium], Clarithromycin, Iohexol, Nsaids, Penicillins, Prednisone, Statins, and Sulfonamide derivatives  Review of Systems   Review of Systems  Unable to perform ROS: Dementia    Physical Exam Updated Vital Signs BP (!) 164/91   Pulse 70   Temp 97.8 F (36.6 C) (Oral)   Resp 20   SpO2 98%   Physical Exam Vitals and nursing note reviewed.  Constitutional:      General: She is not in acute distress.    Appearance: She is well-developed.  HENT:     Head: Normocephalic and atraumatic.     Comments: Minor abrasion to left temple Eyes:     Conjunctiva/sclera: Conjunctivae normal.  Cardiovascular:     Rate and Rhythm: Normal rate and regular rhythm.     Heart sounds: No murmur heard.   Pulmonary:     Effort: Pulmonary effort is normal. No respiratory distress.     Breath sounds: Normal breath sounds.  Abdominal:     Palpations: Abdomen is soft.     Tenderness: There is no abdominal tenderness.  Musculoskeletal:     Cervical back: Neck supple.  Skin:    General: Skin is warm and dry.  Neurological:     Mental  Status: She is alert.     Comments: alert  Psychiatric:     Comments: Unable to assess     ED Results / Procedures / Treatments   Labs (all labs ordered are listed, but only abnormal results are displayed) Labs Reviewed - No data to display  EKG None  Radiology No results found.  Procedures Procedures (including critical care time)  Medications Ordered in ED Medications - No data to display  ED Course  I have reviewed the triage vital signs and the  nursing notes.  Pertinent labs & imaging results that were available during my care of the patient were reviewed by me and considered in my medical decision making (see chart for details).    MDM Rules/Calculators/A&P                          Patient with fall from bed.  She is from skilled nursing facility.  She hit her head.  Fall was unwitnessed.  CT scans ordered of the head and neck.  No acute traumatic findings on CT.  Patient is at baseline per family member who is at bedside.  We will discharge to home.  Patient seen by and discussed with Dr. Dina Rich.  Final Clinical Impression(s) / ED Diagnoses Final diagnoses:  Fall, initial encounter  Injury of head, initial encounter  Aortic aneurysm without rupture, unspecified portion of aorta [facility])    Rx / DC Orders ED Discharge Orders    None       Montine Circle, PA-C 12/27/19 0153    Merryl Hacker, MD 12/27/19 [phone]

## 2019-12-26 NOTE — ED Triage Notes (Signed)
Pt BIB EMS from [facility] Unit c/o unwitnessed fall. Staff reports they heard the pt yell and then found pt on the floor. Appears the pt rolled out of bed and hit head on night stand. Small hematoma to left side of eye. Dementia at baseline. No blood thinners. Unsure of LOC.   170/100 76 HR 118 CBG

## 2019-12-27 DIAGNOSIS — M2550 Pain in unspecified joint: Secondary | ICD-10-CM | POA: Diagnosis not present

## 2019-12-27 DIAGNOSIS — S0990XA Unspecified injury of head, initial encounter: Secondary | ICD-10-CM | POA: Diagnosis not present

## 2019-12-27 DIAGNOSIS — Z043 Encounter for examination and observation following other accident: Secondary | ICD-10-CM | POA: Diagnosis not present

## 2019-12-27 DIAGNOSIS — R5381 Other malaise: Secondary | ICD-10-CM | POA: Diagnosis not present

## 2019-12-27 DIAGNOSIS — Z7401 Bed confinement status: Secondary | ICD-10-CM | POA: Diagnosis not present

## 2019-12-27 LAB — CUP PACEART REMOTE DEVICE CHECK
Battery Remaining Longevity: 34 mo
Battery Remaining Percentage: 25 %
Battery Voltage: 2.8 V
Brady Statistic AP VP Percent: 5.7 %
Brady Statistic AP VS Percent: 4 %
Brady Statistic AS VP Percent: 62 %
Brady Statistic AS VS Percent: 28 %
Brady Statistic RA Percent Paced: 8.6 %
Brady Statistic RV Percent Paced: 69 %
Date Time Interrogation Session: 20211005154549
Implantable Lead Implant Date: 20131022
Implantable Lead Implant Date: 20131022
Implantable Lead Location: 753859
Implantable Lead Location: 753860
Implantable Lead Model: 1944
Implantable Lead Model: 1948
Implantable Pulse Generator Implant Date: 20131022
Lead Channel Impedance Value: 410 Ohm
Lead Channel Impedance Value: 530 Ohm
Lead Channel Pacing Threshold Amplitude: 0.375 V
Lead Channel Pacing Threshold Amplitude: 0.625 V
Lead Channel Pacing Threshold Pulse Width: 0.4 ms
Lead Channel Pacing Threshold Pulse Width: 0.4 ms
Lead Channel Sensing Intrinsic Amplitude: 12 mV
Lead Channel Sensing Intrinsic Amplitude: 5 mV
Lead Channel Setting Pacing Amplitude: 0.875
Lead Channel Setting Pacing Amplitude: 1.375
Lead Channel Setting Pacing Pulse Width: 0.4 ms
Lead Channel Setting Sensing Sensitivity: 2 mV
Pulse Gen Model: 2210
Pulse Gen Serial Number: 7393244

## 2019-12-27 NOTE — Discharge Instructions (Addendum)
The scan of your head and neck revealed no acute traumatic injuries, no bleeding on the brain, no skull fracture, no cervical spine fracture.  There were some chronic changes including arthritis in the neck and a partially visualized aortic aneurysm which appears stable from March of this year.  Please discuss these results with your doctor.

## 2019-12-27 NOTE — ED Notes (Signed)
[facility] called for transport back to [facility]

## 2019-12-28 NOTE — Progress Notes (Signed)
Remote pacemaker transmission.   

## 2020-01-01 DIAGNOSIS — W19XXXA Unspecified fall, initial encounter: Secondary | ICD-10-CM | POA: Diagnosis not present

## 2020-01-01 DIAGNOSIS — I10 Essential (primary) hypertension: Secondary | ICD-10-CM | POA: Diagnosis not present

## 2020-01-05 DIAGNOSIS — I10 Essential (primary) hypertension: Secondary | ICD-10-CM | POA: Diagnosis not present

## 2020-01-05 DIAGNOSIS — K5901 Slow transit constipation: Secondary | ICD-10-CM | POA: Diagnosis not present

## 2020-01-05 DIAGNOSIS — I482 Chronic atrial fibrillation, unspecified: Secondary | ICD-10-CM | POA: Diagnosis not present

## 2020-01-08 DIAGNOSIS — G301 Alzheimer's disease with late onset: Secondary | ICD-10-CM | POA: Diagnosis not present

## 2020-01-08 DIAGNOSIS — M810 Age-related osteoporosis without current pathological fracture: Secondary | ICD-10-CM | POA: Diagnosis not present

## 2020-01-08 DIAGNOSIS — I10 Essential (primary) hypertension: Secondary | ICD-10-CM | POA: Diagnosis not present

## 2020-01-08 DIAGNOSIS — K5901 Slow transit constipation: Secondary | ICD-10-CM | POA: Diagnosis not present

## 2020-01-08 DIAGNOSIS — F0281 Dementia in other diseases classified elsewhere with behavioral disturbance: Secondary | ICD-10-CM | POA: Diagnosis not present

## 2020-01-12 DIAGNOSIS — F062 Psychotic disorder with delusions due to known physiological condition: Secondary | ICD-10-CM | POA: Diagnosis not present

## 2020-01-12 DIAGNOSIS — G301 Alzheimer's disease with late onset: Secondary | ICD-10-CM | POA: Diagnosis not present

## 2020-01-12 DIAGNOSIS — F331 Major depressive disorder, recurrent, moderate: Secondary | ICD-10-CM | POA: Diagnosis not present

## 2020-01-12 DIAGNOSIS — F0281 Dementia in other diseases classified elsewhere with behavioral disturbance: Secondary | ICD-10-CM | POA: Diagnosis not present

## 2020-02-09 DIAGNOSIS — F0281 Dementia in other diseases classified elsewhere with behavioral disturbance: Secondary | ICD-10-CM | POA: Diagnosis not present

## 2020-02-09 DIAGNOSIS — F331 Major depressive disorder, recurrent, moderate: Secondary | ICD-10-CM | POA: Diagnosis not present

## 2020-02-09 DIAGNOSIS — G301 Alzheimer's disease with late onset: Secondary | ICD-10-CM | POA: Diagnosis not present

## 2020-02-09 DIAGNOSIS — F062 Psychotic disorder with delusions due to known physiological condition: Secondary | ICD-10-CM | POA: Diagnosis not present

## 2020-02-12 DIAGNOSIS — K5901 Slow transit constipation: Secondary | ICD-10-CM | POA: Diagnosis not present

## 2020-02-12 DIAGNOSIS — F0281 Dementia in other diseases classified elsewhere with behavioral disturbance: Secondary | ICD-10-CM | POA: Diagnosis not present

## 2020-02-12 DIAGNOSIS — G301 Alzheimer's disease with late onset: Secondary | ICD-10-CM | POA: Diagnosis not present

## 2020-02-12 DIAGNOSIS — I482 Chronic atrial fibrillation, unspecified: Secondary | ICD-10-CM | POA: Diagnosis not present

## 2020-02-12 DIAGNOSIS — I10 Essential (primary) hypertension: Secondary | ICD-10-CM | POA: Diagnosis not present

## 2020-02-13 ENCOUNTER — Telehealth: Payer: Self-pay | Admitting: Internal Medicine

## 2020-02-13 NOTE — Telephone Encounter (Signed)
Notified family [name], that patients device will be checked at her apt. Verbalized understanding. Routed to scheduling to make apt.

## 2020-02-13 NOTE — Telephone Encounter (Signed)
[facility] called on behalf of the patient to assist with getting a physical pacer check done. Patient was unsure when she needs her next one - please call/advise.   Saw an overdue recall with Chanetta Marshall but it did not specify pacer check

## 2020-02-26 NOTE — Progress Notes (Signed)
Cardiology Office Note Date:  02/28/2020  Patient ID:  [name], [name] Nov 22, 1923, MRN [id number] PCP:  Binnie Rail, MD  Cardiologist:  Dr. Stanford Breed Electrophysiologist: Dr. Rayann Heman  Chief Complaint: annual pacer visit  History of Present Illness: [name] is a 86 y.o. female with history of CAD, VHD (s/p CABG, AVR (bioprosthetic), and thoracic aortic aneurysm repair, 2003), TN, HLD, CHB w/PPM, AFib, (by record not on a/c 2/2 dementia and falls), dementia w/psychoitic features.  Lives at [facility]  She comes today to be seen for Dr. Rayann Heman, last seen for EP service by A. Lynnell Jude, NP, Oct 2019, device function was intact.  Most recently saw J. Marilynn Rail, NP Jan 2021, doing well, no changes were made.  TODAY She comes today accompanied by her daughter in law.  She has advancing dementia, answers yes to most of my questions. Her daughter in law knows of no cardiac concerns or symptoms.Sharlyne Pacas have never observed her to look SOB, no reports of syncope, though does have falls   Device information SJM dual chamber PPM implanted 01/12/2012   Past Medical History:  Diagnosis Date  . Anemia, unspecified   . Anxiety state, unspecified   . Atrial fibrillation (HCC)    not anticoagulation candidate due to falls  . CAD (coronary artery disease)    s/p CABG  . Carotid artery stenosis   . Complete heart block ([place]) 12/2011   s/p St.Jude dual chamber PPM 01/12/12  . Dementia ([place])   . Dementia ([place]) 05/09/2017  . Diverticulosis of colon (without mention of hemorrhage)   . Esophageal reflux   . HTN (hypertension)   . Hyperlipidemia   . Osteoarthrosis, unspecified whether generalized or localized, unspecified site   . Osteoporosis, unspecified   . Pacemaker-St.Jude 01/13/2012  . S/P AVR (aortic valve replacement) 2003   bioprosthetic   . Thoracic aortic aneurysm ([place]) 2003   s/p repair  . Unspecified chronic bronchitis ([place])     Past Surgical History:  Procedure Laterality  Date  . AORTIC VALVE REPLACEMENT  2003  . CATARACT EXTRACTION     bilateral  . CORONARY ARTERY BYPASS GRAFT    . INTRAOCULAR LENS INSERTION     bilateral  . PACEMAKER INSERTION  01/12/12   SJM Accent DR RF implanted by Dr Rayann Heman for CHB  . PERMANENT PACEMAKER INSERTION N/A 01/12/2012   Procedure: PERMANENT PACEMAKER INSERTION;  Surgeon: Thompson Grayer, MD;  Location: [facility] CATH LAB;  Service: Cardiovascular;  Laterality: N/A;  . [place]   left  . VESICOVAGINAL FISTULA CLOSURE W/ TAH      Current Outpatient Medications  Medication Sig Dispense Refill  . acetaminophen (TYLENOL) 325 MG tablet Take 650 mg by mouth every 6 (six) hours as needed (pain).     Marland Kitchen alendronate (FOSAMAX) 70 MG tablet TAKE 1 TABLET BY MOUTH EVERY 7 DAYS, TAKE WITH A FULL GLASS OF WATER ON AN EMPTY STOMACH 12 tablet 0  . aspirin EC 81 MG tablet Take 1 tablet (81 mg total) by mouth daily. 90 tablet 3  . cholecalciferol (VITAMIN D) 1000 UNITS tablet Take 1,000 Units by mouth daily with breakfast.     . citalopram (CELEXA) 20 MG tablet TAKE 1 TABLET BY MOUTH ONCE DAILY AT  6PM 90 tablet 0  . folic acid (FOLVITE) 1 MG tablet Take 1 tablet (1 mg total) by mouth daily. 90 tablet 0  . furosemide (LASIX) 40 MG tablet Take 2 tablets by mouth once daily  180 tablet 0  . Lidocaine HCl (LIDOCAINE PLUS) 4 % CREA Apply 1 application topically 3 (three) times daily.    Marland Kitchen nystatin (MYCOSTATIN/NYSTOP) powder Apply topically 4 (four) times daily. 60 g 3  . Petrolatum-Zinc Oxide 49-15 % OINT Apply to affected area often until rash has resolved, then d/c 113 g 3  . pravastatin (PRAVACHOL) 40 MG tablet Take 1 tablet (40 mg total) by mouth at bedtime. 90 tablet 1  . risperiDONE (RISPERDAL) 0.25 MG tablet Take 1 tablet (0.25 mg total) by mouth at bedtime. 90 tablet 1   No current facility-administered medications for this visit.    Allergies:   Coumadin [warfarin sodium], Clarithromycin, Iohexol, Nsaids, Penicillins,  Prednisone, Statins, and Sulfonamide derivatives   Social History:  The patient  reports that she has never smoked. She has never used smokeless tobacco. She reports that she does not drink alcohol and does not use drugs.   Family History:  The patient's family history includes Diabetes in her brother and sister.  ROS:  Please see the history of present illness.    All other systems are reviewed and otherwise negative.   PHYSICAL EXAM:  VS:  BP 116/68   Pulse 81   Ht 5\' 3"  (1.6 m)   Wt 162 lb 6.4 oz (73.7 kg)   SpO2 91%   BMI 28.77 kg/m  BMI: Body mass index is 28.77 kg/m. Well nourished, well developed, elderly female in a wheelchair, appears in no acute distress HEENT: normocephalic, atraumatic Neck: no JVD, carotid bruits or masses Cardiac:  RRR; no significant murmurs, no rubs, or gallops Lungs:  CTA b/l, no wheezing, rhonchi or rales Abd: soft, nontender MS: no deformity, age appropriate atrophy Ext: no edema Skin: warm and dry, no rash Neuro:  No gross deficits appreciated Psych: euthymic mood, full affect  PPM site is stable, no tethering or discomfort   EKG:  Not done today  Device interrogation done today and reviewed by myself:  Battery and lead measurements are stable No R waves at 40 today + AMS Some are AT, other AFib, burden 4.4% (since 2019) Longest 13 hours back in Jan 2021 One HVR was a NSVT (Dec 2020)   06/28/2012: TTE Study Conclusions  - Left ventricle: The cavity size was mildly reduced. Wall  thickness was increased in a pattern of moderate LVH.  Systolic function was normal. The estimated ejection  fraction was in the range of 55% to 65%. Wall motion was  normal; there were no regional wall motion abnormalities.  There was an increased relative contribution of atrial  contraction to ventricular filling.  - Aortic valve: A bioprosthesis was present. There was very  mild stenosis. Mean gradient: 90mm Hg (S). Peak gradient:   91mm Hg (S).  - Left atrium: The atrium was mildly dilated.     Recent Labs: 06/14/2019: ALT 14; BUN 29; Creatinine, Ser 1.67; Hemoglobin 11.7; Platelets 220; Potassium 3.6; Sodium 141  No results found for requested labs within last 8760 hours.   CrCl cannot be calculated (Patient's most recent lab result is older than the maximum 21 days allowed.).   Wt Readings from Last 3 Encounters:  02/28/20 162 lb 6.4 oz (73.7 kg)  06/14/19 180 lb (81.6 kg)  03/28/19 165 lb (74.8 kg)     Other studies reviewed: Additional studies/records reviewed today include: summarized above  ASSESSMENT AND PLAN:  1. PPM     Intact function, cybersecurity upgrade completed)  2. Paroxysmal Afib  CHA2DS2Vasc is 5     Not felt a/c candidate 2/2 dimentia/falls     Low burden  3. CAD     No noted symptoms or complaints are known to family     Unable to assesses well given her advance dementia       4. VHD      S/p bioprosthetic AVR     No exam findings to suggest volume OL or clinical changes  5. HTN     No changes     Disposition: F/u with Q 73mo remotes and in clinic with EP in a year, sooner if needed  Current medicines are reviewed at length with the patient today.  The patient did not have any concerns regarding medicines.  Venetia Night, PA-C 02/28/2020 3:12 PM     [address] [phone] (office)  [phone] (fax)

## 2020-02-28 ENCOUNTER — Encounter: Payer: Self-pay | Admitting: Physician Assistant

## 2020-02-28 ENCOUNTER — Other Ambulatory Visit: Payer: Self-pay

## 2020-02-28 ENCOUNTER — Ambulatory Visit (INDEPENDENT_AMBULATORY_CARE_PROVIDER_SITE_OTHER): Payer: Medicare Other | Admitting: Physician Assistant

## 2020-02-28 VITALS — BP 116/68 | HR 81 | Ht 63.0 in | Wt 162.4 lb

## 2020-02-28 DIAGNOSIS — I10 Essential (primary) hypertension: Secondary | ICD-10-CM

## 2020-02-28 DIAGNOSIS — I480 Paroxysmal atrial fibrillation: Secondary | ICD-10-CM | POA: Diagnosis not present

## 2020-02-28 DIAGNOSIS — I441 Atrioventricular block, second degree: Secondary | ICD-10-CM | POA: Diagnosis not present

## 2020-02-28 DIAGNOSIS — I2510 Atherosclerotic heart disease of native coronary artery without angina pectoris: Secondary | ICD-10-CM

## 2020-02-28 DIAGNOSIS — Z950 Presence of cardiac pacemaker: Secondary | ICD-10-CM

## 2020-02-28 NOTE — Patient Instructions (Addendum)

## 2020-03-08 DIAGNOSIS — F331 Major depressive disorder, recurrent, moderate: Secondary | ICD-10-CM | POA: Diagnosis not present

## 2020-03-08 DIAGNOSIS — G301 Alzheimer's disease with late onset: Secondary | ICD-10-CM | POA: Diagnosis not present

## 2020-03-08 DIAGNOSIS — F062 Psychotic disorder with delusions due to known physiological condition: Secondary | ICD-10-CM | POA: Diagnosis not present

## 2020-03-08 DIAGNOSIS — F0281 Dementia in other diseases classified elsewhere with behavioral disturbance: Secondary | ICD-10-CM | POA: Diagnosis not present

## 2020-03-11 DIAGNOSIS — I482 Chronic atrial fibrillation, unspecified: Secondary | ICD-10-CM | POA: Diagnosis not present

## 2020-03-11 DIAGNOSIS — K5901 Slow transit constipation: Secondary | ICD-10-CM | POA: Diagnosis not present

## 2020-03-11 DIAGNOSIS — G301 Alzheimer's disease with late onset: Secondary | ICD-10-CM | POA: Diagnosis not present

## 2020-03-11 DIAGNOSIS — F0281 Dementia in other diseases classified elsewhere with behavioral disturbance: Secondary | ICD-10-CM | POA: Diagnosis not present

## 2020-03-11 DIAGNOSIS — I10 Essential (primary) hypertension: Secondary | ICD-10-CM | POA: Diagnosis not present

## 2020-03-28 ENCOUNTER — Ambulatory Visit (INDEPENDENT_AMBULATORY_CARE_PROVIDER_SITE_OTHER): Payer: Medicare Other

## 2020-03-28 DIAGNOSIS — I441 Atrioventricular block, second degree: Secondary | ICD-10-CM | POA: Diagnosis not present

## 2020-03-28 LAB — CUP PACEART REMOTE DEVICE CHECK
Battery Remaining Longevity: 30 mo
Battery Remaining Percentage: 22 %
Battery Voltage: 2.78 V
Brady Statistic AP VP Percent: 8.3 %
Brady Statistic AP VS Percent: 1 %
Brady Statistic AS VP Percent: 91 %
Brady Statistic AS VS Percent: 1 %
Brady Statistic RA Percent Paced: 7.2 %
Brady Statistic RV Percent Paced: 99 %
Date Time Interrogation Session: 20220106022343
Implantable Lead Implant Date: 20131022
Implantable Lead Implant Date: 20131022
Implantable Lead Location: 753859
Implantable Lead Location: 753860
Implantable Lead Model: 1944
Implantable Lead Model: 1948
Implantable Pulse Generator Implant Date: 20131022
Lead Channel Impedance Value: 440 Ohm
Lead Channel Impedance Value: 590 Ohm
Lead Channel Pacing Threshold Amplitude: 0.5 V
Lead Channel Pacing Threshold Amplitude: 0.75 V
Lead Channel Pacing Threshold Pulse Width: 0.4 ms
Lead Channel Pacing Threshold Pulse Width: 0.4 ms
Lead Channel Sensing Intrinsic Amplitude: 12 mV
Lead Channel Sensing Intrinsic Amplitude: 5 mV
Lead Channel Setting Pacing Amplitude: 1 V
Lead Channel Setting Pacing Amplitude: 1.5 V
Lead Channel Setting Pacing Pulse Width: 0.4 ms
Lead Channel Setting Sensing Sensitivity: 2 mV
Pulse Gen Model: 2210
Pulse Gen Serial Number: 7393244

## 2020-04-11 NOTE — Progress Notes (Signed)
Remote pacemaker transmission.   

## 2020-04-24 NOTE — Progress Notes (Deleted)
HPI: FU coronary disease status post bypassing graft, history of thoracic aortic aneurysm repair, as well as aortic valve replacement and paroxysmal atrial fibrillation. Last Myoview was performed on September 02, 2006. At that time she had normal perfusion with an ejection fraction of 76%. Carotid Dopplers performed in Sept 2011 revealed normal carotids. Note she has also had problems with dementia with psychotic features. The patient was found to have symptomatic high degree AV block in October of 2013 and had a pacemaker placed. Echocardiogram April 2014 showed normal LV function, moderate left ventricular hypertrophy, bioprosthetic aortic valve with a mean gradient of 20 mmHg and mild left atrial enlargement. CTA showed descending TAA measuring 6.8 x 6.3 cm. Since last seen,  Current Outpatient Medications  Medication Sig Dispense Refill  . acetaminophen (TYLENOL) 325 MG tablet Take 650 mg by mouth every 6 (six) hours as needed (pain).     Marland Kitchen alendronate (FOSAMAX) 70 MG tablet TAKE 1 TABLET BY MOUTH EVERY 7 DAYS, TAKE WITH A FULL GLASS OF WATER ON AN EMPTY STOMACH 12 tablet 0  . aspirin EC 81 MG tablet Take 1 tablet (81 mg total) by mouth daily. 90 tablet 3  . cholecalciferol (VITAMIN D) 1000 UNITS tablet Take 1,000 Units by mouth daily with breakfast.     . citalopram (CELEXA) 20 MG tablet TAKE 1 TABLET BY MOUTH ONCE DAILY AT  6PM 90 tablet 0  . folic acid (FOLVITE) 1 MG tablet Take 1 tablet (1 mg total) by mouth daily. 90 tablet 0  . furosemide (LASIX) 40 MG tablet Take 2 tablets by mouth once daily 180 tablet 0  . Lidocaine HCl (LIDOCAINE PLUS) 4 % CREA Apply 1 application topically 3 (three) times daily.    Marland Kitchen nystatin (MYCOSTATIN/NYSTOP) powder Apply topically 4 (four) times daily. 60 g 3  . Petrolatum-Zinc Oxide 49-15 % OINT Apply to affected area often until rash has resolved, then d/c 113 g 3  . pravastatin (PRAVACHOL) 40 MG tablet Take 1 tablet (40 mg total) by mouth at bedtime. 90  tablet 1  . risperiDONE (RISPERDAL) 0.25 MG tablet Take 1 tablet (0.25 mg total) by mouth at bedtime. 90 tablet 1   No current facility-administered medications for this visit.     Past Medical History:  Diagnosis Date  . Anemia, unspecified   . Anxiety state, unspecified   . Atrial fibrillation (HCC)    not anticoagulation candidate due to falls  . CAD (coronary artery disease)    s/p CABG  . Carotid artery stenosis   . Complete heart block ([place]) 12/2011   s/p St.Jude dual chamber PPM 01/12/12  . Dementia ([place])   . Dementia ([place]) 05/09/2017  . Diverticulosis of colon (without mention of hemorrhage)   . Esophageal reflux   . HTN (hypertension)   . Hyperlipidemia   . Osteoarthrosis, unspecified whether generalized or localized, unspecified site   . Osteoporosis, unspecified   . Pacemaker-St.Jude 01/13/2012  . S/P AVR (aortic valve replacement) 2003   bioprosthetic   . Thoracic aortic aneurysm ([place]) 2003   s/p repair  . Unspecified chronic bronchitis ([place])     Past Surgical History:  Procedure Laterality Date  . AORTIC VALVE REPLACEMENT  2003  . CATARACT EXTRACTION     bilateral  . CORONARY ARTERY BYPASS GRAFT    . INTRAOCULAR LENS INSERTION     bilateral  . PACEMAKER INSERTION  01/12/12   SJM Accent DR RF implanted by Dr Rayann Heman for CHB  .  PERMANENT PACEMAKER INSERTION N/A 01/12/2012   Procedure: PERMANENT PACEMAKER INSERTION;  Surgeon: Thompson Grayer, MD;  Location: [facility] CATH LAB;  Service: Cardiovascular;  Laterality: N/A;  . [place]   left  . VESICOVAGINAL FISTULA CLOSURE W/ TAH      Social History   Socioeconomic History  . Marital status: Married    Spouse name: [name]  . Number of children: Not on file  . Years of education: Not on file  . Highest education level: Not on file  Occupational History  . Occupation: retired  Tobacco Use  . Smoking status: Never Smoker  . Smokeless tobacco: Never Used  Vaping Use  . Vaping Use: Never used   Substance and Sexual Activity  . Alcohol use: No  . Drug use: No  . Sexual activity: Not on file  Other Topics Concern  . Not on file  Social History Narrative   Lives with husband. Mother had kidney problems.  Father had coronary artery  disease.  Both died when the patient was young.  She does not know how old  they were.  She has one brother who died at age 13 of a CVA, although, he  had coronary artery disease and diabetes.  Another brother died at age 69 of  a stroke.  He also had coronary artery disease.  One sister is living, she  has coronary artery disease and diabetes and one sister has anginal symptoms. Her sister has a pacemaker (history palpitations).          Social Determinants of Health   Financial Resource Strain: Not on file  Food Insecurity: Not on file  Transportation Needs: Not on file  Physical Activity: Not on file  Stress: Not on file  Social Connections: Not on file  Intimate Partner Violence: Not on file    Family History  Problem Relation Age of Onset  . Diabetes Brother   . Diabetes Sister     ROS: no fevers or chills, productive cough, hemoptysis, dysphasia, odynophagia, melena, hematochezia, dysuria, hematuria, rash, seizure activity, orthopnea, PND, pedal edema, claudication. Remaining systems are negative.  Physical Exam: Well-developed well-nourished in no acute distress.  Skin is warm and dry.  HEENT is normal.  Neck is supple.  Chest is clear to auscultation with normal expansion.  Cardiovascular exam is regular rate and rhythm.  Abdominal exam nontender or distended. No masses palpated. Extremities show no edema. neuro grossly intact  ECG- personally reviewed  A/P  1 CAD-continue ASA and statin.  2 AVR-continue SBE prophylaxis.  3 PAF-pt remains in sinus; not an anticoagulation due to fall risk and dementia.  4 hypertension-BP controlled; continue present meds and follow.  5 hyperlipidemia-continue statin.  6 pacemaker-per  EP.  Kirk Ruths, MD

## 2020-05-03 ENCOUNTER — Ambulatory Visit: Payer: Medicare Other | Admitting: Cardiology

## 2020-05-28 ENCOUNTER — Inpatient Hospital Stay (HOSPITAL_COMMUNITY)
Admission: EM | Admit: 2020-05-28 | Discharge: 2020-06-04 | DRG: 291 | Disposition: A | Discharge status: Hospice | Payer: Medicare Other | Source: Skilled Nursing Facility | Attending: Internal Medicine | Admitting: Internal Medicine

## 2020-05-28 ENCOUNTER — Emergency Department (HOSPITAL_COMMUNITY): Payer: Medicare Other

## 2020-05-28 ENCOUNTER — Inpatient Hospital Stay (HOSPITAL_COMMUNITY): Payer: Medicare Other

## 2020-05-28 ENCOUNTER — Other Ambulatory Visit: Payer: Self-pay

## 2020-05-28 DIAGNOSIS — J42 Unspecified chronic bronchitis: Secondary | ICD-10-CM | POA: Diagnosis present

## 2020-05-28 DIAGNOSIS — R32 Unspecified urinary incontinence: Secondary | ICD-10-CM | POA: Diagnosis present

## 2020-05-28 DIAGNOSIS — Z515 Encounter for palliative care: Secondary | ICD-10-CM | POA: Diagnosis not present

## 2020-05-28 DIAGNOSIS — M7989 Other specified soft tissue disorders: Secondary | ICD-10-CM | POA: Diagnosis not present

## 2020-05-28 DIAGNOSIS — Z9181 History of falling: Secondary | ICD-10-CM

## 2020-05-28 DIAGNOSIS — Z66 Do not resuscitate: Secondary | ICD-10-CM | POA: Diagnosis present

## 2020-05-28 DIAGNOSIS — W19XXXA Unspecified fall, initial encounter: Secondary | ICD-10-CM | POA: Diagnosis present

## 2020-05-28 DIAGNOSIS — K219 Gastro-esophageal reflux disease without esophagitis: Secondary | ICD-10-CM | POA: Diagnosis present

## 2020-05-28 DIAGNOSIS — I442 Atrioventricular block, complete: Secondary | ICD-10-CM | POA: Diagnosis present

## 2020-05-28 DIAGNOSIS — J9601 Acute respiratory failure with hypoxia: Principal | ICD-10-CM | POA: Diagnosis present

## 2020-05-28 DIAGNOSIS — Z950 Presence of cardiac pacemaker: Secondary | ICD-10-CM

## 2020-05-28 DIAGNOSIS — I5041 Acute combined systolic (congestive) and diastolic (congestive) heart failure: Secondary | ICD-10-CM | POA: Diagnosis present

## 2020-05-28 DIAGNOSIS — I712 Thoracic aortic aneurysm, without rupture, unspecified: Secondary | ICD-10-CM

## 2020-05-28 DIAGNOSIS — Z79899 Other long term (current) drug therapy: Secondary | ICD-10-CM

## 2020-05-28 DIAGNOSIS — Z90710 Acquired absence of both cervix and uterus: Secondary | ICD-10-CM

## 2020-05-28 DIAGNOSIS — R531 Weakness: Secondary | ICD-10-CM | POA: Diagnosis present

## 2020-05-28 DIAGNOSIS — Z20822 Contact with and (suspected) exposure to covid-19: Secondary | ICD-10-CM | POA: Diagnosis present

## 2020-05-28 DIAGNOSIS — Z881 Allergy status to other antibiotic agents status: Secondary | ICD-10-CM

## 2020-05-28 DIAGNOSIS — Z96642 Presence of left artificial hip joint: Secondary | ICD-10-CM | POA: Diagnosis present

## 2020-05-28 DIAGNOSIS — E876 Hypokalemia: Secondary | ICD-10-CM | POA: Diagnosis present

## 2020-05-28 DIAGNOSIS — F411 Generalized anxiety disorder: Secondary | ICD-10-CM | POA: Diagnosis present

## 2020-05-28 DIAGNOSIS — I2510 Atherosclerotic heart disease of native coronary artery without angina pectoris: Secondary | ICD-10-CM | POA: Diagnosis present

## 2020-05-28 DIAGNOSIS — I130 Hypertensive heart and chronic kidney disease with heart failure and stage 1 through stage 4 chronic kidney disease, or unspecified chronic kidney disease: Secondary | ICD-10-CM | POA: Diagnosis present

## 2020-05-28 DIAGNOSIS — Z882 Allergy status to sulfonamides status: Secondary | ICD-10-CM

## 2020-05-28 DIAGNOSIS — Z7983 Long term (current) use of bisphosphonates: Secondary | ICD-10-CM

## 2020-05-28 DIAGNOSIS — N179 Acute kidney failure, unspecified: Secondary | ICD-10-CM | POA: Diagnosis present

## 2020-05-28 DIAGNOSIS — Z953 Presence of xenogenic heart valve: Secondary | ICD-10-CM

## 2020-05-28 DIAGNOSIS — R0602 Shortness of breath: Secondary | ICD-10-CM | POA: Diagnosis not present

## 2020-05-28 DIAGNOSIS — N1832 Chronic kidney disease, stage 3b: Secondary | ICD-10-CM | POA: Diagnosis present

## 2020-05-28 DIAGNOSIS — R778 Other specified abnormalities of plasma proteins: Secondary | ICD-10-CM | POA: Diagnosis present

## 2020-05-28 DIAGNOSIS — Z91041 Radiographic dye allergy status: Secondary | ICD-10-CM

## 2020-05-28 DIAGNOSIS — R7303 Prediabetes: Secondary | ICD-10-CM | POA: Diagnosis present

## 2020-05-28 DIAGNOSIS — I480 Paroxysmal atrial fibrillation: Secondary | ICD-10-CM | POA: Diagnosis present

## 2020-05-28 DIAGNOSIS — D72829 Elevated white blood cell count, unspecified: Secondary | ICD-10-CM | POA: Diagnosis present

## 2020-05-28 DIAGNOSIS — E785 Hyperlipidemia, unspecified: Secondary | ICD-10-CM | POA: Diagnosis present

## 2020-05-28 DIAGNOSIS — Z833 Family history of diabetes mellitus: Secondary | ICD-10-CM

## 2020-05-28 DIAGNOSIS — R7989 Other specified abnormal findings of blood chemistry: Secondary | ICD-10-CM | POA: Diagnosis not present

## 2020-05-28 DIAGNOSIS — M810 Age-related osteoporosis without current pathological fracture: Secondary | ICD-10-CM | POA: Diagnosis present

## 2020-05-28 DIAGNOSIS — M1611 Unilateral primary osteoarthritis, right hip: Secondary | ICD-10-CM | POA: Diagnosis present

## 2020-05-28 DIAGNOSIS — Z880 Allergy status to penicillin: Secondary | ICD-10-CM

## 2020-05-28 DIAGNOSIS — G9341 Metabolic encephalopathy: Secondary | ICD-10-CM | POA: Diagnosis present

## 2020-05-28 DIAGNOSIS — N183 Chronic kidney disease, stage 3 unspecified: Secondary | ICD-10-CM | POA: Diagnosis present

## 2020-05-28 DIAGNOSIS — N189 Chronic kidney disease, unspecified: Secondary | ICD-10-CM

## 2020-05-28 DIAGNOSIS — F0390 Unspecified dementia without behavioral disturbance: Secondary | ICD-10-CM | POA: Diagnosis present

## 2020-05-28 DIAGNOSIS — G8929 Other chronic pain: Secondary | ICD-10-CM | POA: Diagnosis present

## 2020-05-28 DIAGNOSIS — Z886 Allergy status to analgesic agent status: Secondary | ICD-10-CM

## 2020-05-28 DIAGNOSIS — Z7982 Long term (current) use of aspirin: Secondary | ICD-10-CM

## 2020-05-28 DIAGNOSIS — Z951 Presence of aortocoronary bypass graft: Secondary | ICD-10-CM

## 2020-05-28 DIAGNOSIS — I5031 Acute diastolic (congestive) heart failure: Secondary | ICD-10-CM | POA: Diagnosis not present

## 2020-05-28 LAB — LACTIC ACID, PLASMA: Lactic Acid, Venous: 1.9 mmol/L (ref 0.5–1.9)

## 2020-05-28 LAB — HEPATIC FUNCTION PANEL
ALT: 17 U/L (ref 0–44)
AST: 31 U/L (ref 15–41)
Albumin: 3.2 g/dL — ABNORMAL LOW (ref 3.5–5.0)
Alkaline Phosphatase: 88 U/L (ref 38–126)
Bilirubin, Direct: 0.2 mg/dL (ref 0.0–0.2)
Indirect Bilirubin: 1 mg/dL — ABNORMAL HIGH (ref 0.3–0.9)
Total Bilirubin: 1.2 mg/dL (ref 0.3–1.2)
Total Protein: 7.1 g/dL (ref 6.5–8.1)

## 2020-05-28 LAB — CBC
HCT: 40.2 % (ref 36.0–46.0)
Hemoglobin: 12.7 g/dL (ref 12.0–15.0)
MCH: 27.9 pg (ref 26.0–34.0)
MCHC: 31.6 g/dL (ref 30.0–36.0)
MCV: 88.4 fL (ref 80.0–100.0)
Platelets: 225 10*3/uL (ref 150–400)
RBC: 4.55 MIL/uL (ref 3.87–5.11)
RDW: 17.8 % — ABNORMAL HIGH (ref 11.5–15.5)
WBC: 16.9 10*3/uL — ABNORMAL HIGH (ref 4.0–10.5)
nRBC: 0 % (ref 0.0–0.2)

## 2020-05-28 LAB — BASIC METABOLIC PANEL
Anion gap: 18 — ABNORMAL HIGH (ref 5–15)
BUN: 29 mg/dL — ABNORMAL HIGH (ref 8–23)
CO2: 18 mmol/L — ABNORMAL LOW (ref 22–32)
Calcium: 9.5 mg/dL (ref 8.9–10.3)
Chloride: 101 mmol/L (ref 98–111)
Creatinine, Ser: 2.06 mg/dL — ABNORMAL HIGH (ref 0.44–1.00)
GFR, Estimated: 22 mL/min — ABNORMAL LOW (ref >60)
Glucose, Bld: 165 mg/dL — ABNORMAL HIGH (ref 70–99)
Potassium: 4.3 mmol/L (ref 3.5–5.1)
Sodium: 137 mmol/L (ref 135–145)

## 2020-05-28 LAB — CBG MONITORING, ED: Glucose-Capillary: 164 mg/dL — ABNORMAL HIGH (ref 70–99)

## 2020-05-28 MED ORDER — SODIUM CHLORIDE 0.9 % IV SOLN
1.0000 g | Freq: Once | INTRAVENOUS | Status: AC
  Administered 2020-05-28: 1 g via INTRAVENOUS
  Filled 2020-05-28: qty 10

## 2020-05-28 MED ORDER — FENTANYL CITRATE (PF) 100 MCG/2ML IJ SOLN
25.0000 ug | Freq: Once | INTRAMUSCULAR | Status: AC
  Administered 2020-05-28: 25 ug via INTRAVENOUS
  Filled 2020-05-28: qty 2

## 2020-05-28 NOTE — ED Provider Notes (Signed)
[facility] EMERGENCY DEPARTMENT Provider Note   CSN: [id number] Arrival date & time: 05/28/20  1813     History Chief Complaint  Patient presents with  . Weakness    [name] is a 87 y.o. female.  The history is provided by the patient, the EMS personnel, a relative and medical records.   [name] is a 87 y.o. female who presents to the Emergency Department complaining of AMS.  Level V caveat due to dementia.  Hx is provided by EMS and the patient's daughter. Patient is a resident of [facility] skilled nursing facility for the last two years. She has advanced dementia and is confused at baseline but does walk with a walker. Family last saw her on Saturday and she was doing well in her routine state of health. Today the facility stated that she had bowel incontinence and couldn't get up to eat. She was lethargic, looked pale and had purple lips. She does have chronic urinary incontinence and this is at her baseline. The patient has had falls recently, last episode was about a week ago but she fell on carpet and not appear to injure herself at that time. She has chronic pain in her right hip for very long time, family states that this is likely at her baseline.  She has a known thoracic aortic aneurysm, history of aortic valve replacement.    Past Medical History:  Diagnosis Date  . Anemia, unspecified   . Anxiety state, unspecified   . Atrial fibrillation (HCC)    not anticoagulation candidate due to falls  . CAD (coronary artery disease)    s/p CABG  . Carotid artery stenosis   . Complete heart block ([place]) 12/2011   s/p St.Jude dual chamber PPM 01/12/12  . Dementia ([place])   . Dementia ([place]) 05/09/2017  . Diverticulosis of colon (without mention of hemorrhage)   . Esophageal reflux   . HTN (hypertension)   . Hyperlipidemia   . Osteoarthrosis, unspecified whether generalized or localized, unspecified site   . Osteoporosis, unspecified   .  Pacemaker-St.Jude 01/13/2012  . S/P AVR (aortic valve replacement) 2003   bioprosthetic   . Thoracic aortic aneurysm ([place]) 2003   s/p repair  . Unspecified chronic bronchitis [facility])     Patient Active Problem List   Diagnosis Date Noted  . Acute hypoxemic respiratory failure ([place]) 05/28/2020  . Hematuria 09/20/2018  . Bilateral leg edema 09/20/2018  . Prediabetes 09/09/2016  . CKD (chronic kidney disease) stage 3, GFR 30-59 ml/min (HCC) 09/10/2015  . Arthritis of right hip, severe 09/10/2015  . Pacemaker-St.Jude 01/13/2012  . Second degree Mobitz II AV block 01/11/2012  . CAROTID STENOSIS 11/29/2008  . Hearing loss 10/12/2008  . Osteoporosis 05/08/2008  . AORTIC VALVE REPLACEMENT, HX OF 05/08/2008  . Hyperlipidemia 04/13/2007  . Mild dementia ([place]) 04/13/2007  . Coronary atherosclerosis 04/13/2007  . Paroxysmal atrial fibrillation ([place]) 04/13/2007  . DIVERTICULOSIS OF COLON 04/13/2007  . Anxiety state 01/18/2007  . Essential hypertension 01/18/2007    Past Surgical History:  Procedure Laterality Date  . AORTIC VALVE REPLACEMENT  2003  . CATARACT EXTRACTION     bilateral  . CORONARY ARTERY BYPASS GRAFT    . INTRAOCULAR LENS INSERTION     bilateral  . PACEMAKER INSERTION  01/12/12   SJM Accent DR RF implanted by Dr Rayann Heman for CHB  . PERMANENT PACEMAKER INSERTION N/A 01/12/2012   Procedure: PERMANENT PACEMAKER INSERTION;  Surgeon: Thompson Grayer, MD;  Location:  [facility] CATH LAB;  Service: Cardiovascular;  Laterality: N/A;  . [place]   left  . VESICOVAGINAL FISTULA CLOSURE W/ TAH       OB History   No obstetric history on file.     Family History  Problem Relation Age of Onset  . Diabetes Brother   . Diabetes Sister     Social History   Tobacco Use  . Smoking status: Never Smoker  . Smokeless tobacco: Never Used  Vaping Use  . Vaping Use: Never used  Substance Use Topics  . Alcohol use: No  . Drug use: No    Home Medications Prior to  Admission medications   Medication Sig Start Date End Date Taking? Authorizing Provider  acetaminophen (TYLENOL) 325 MG tablet Take 650 mg by mouth every 6 (six) hours as needed (pain).     [provider]  alendronate (FOSAMAX) 70 MG tablet TAKE 1 TABLET BY MOUTH EVERY 7 DAYS, TAKE WITH A FULL GLASS OF WATER ON AN EMPTY STOMACH 11/18/18   Binnie Rail, MD  aspirin EC 81 MG tablet Take 1 tablet (81 mg total) by mouth daily. 03/21/18   Lelon Perla, MD  cholecalciferol (VITAMIN D) 1000 UNITS tablet Take 1,000 Units by mouth daily with breakfast.     [provider]  citalopram (CELEXA) 20 MG tablet TAKE 1 TABLET BY MOUTH ONCE DAILY AT  6PM 08/17/18   Binnie Rail, MD  folic acid (FOLVITE) 1 MG tablet Take 1 tablet (1 mg total) by mouth daily. 11/16/18   Binnie Rail, MD  furosemide (LASIX) 40 MG tablet Take 2 tablets by mouth once daily 11/15/18   Binnie Rail, MD  Lidocaine HCl (LIDOCAINE PLUS) 4 % CREA Apply 1 application topically 3 (three) times daily.    [provider]  nystatin (MYCOSTATIN/NYSTOP) powder Apply topically 4 (four) times daily. 06/08/18   Binnie Rail, MD  Petrolatum-Zinc Oxide 49-15 % OINT Apply to affected area often until rash has resolved, then d/c 12/09/18   Binnie Rail, MD  pravastatin (PRAVACHOL) 40 MG tablet Take 1 tablet (40 mg total) by mouth at bedtime. 06/30/18   Binnie Rail, MD  risperiDONE (RISPERDAL) 0.25 MG tablet Take 1 tablet (0.25 mg total) by mouth at bedtime. 06/30/18   Binnie Rail, MD    Allergies    Coumadin [warfarin sodium], Clarithromycin, Iohexol, Nsaids, Penicillins, Prednisone, Statins, and Sulfonamide derivatives  Review of Systems   Review of Systems  All other systems reviewed and are negative.   Physical Exam Updated Vital Signs BP 129/65   Pulse 70   Temp 98.4 F (36.9 C)   Resp 14   SpO2 97%   Physical Exam Vitals and nursing note reviewed.  Constitutional:      Appearance: She is  well-developed and well-nourished.  HENT:     Head: Normocephalic and atraumatic.  Cardiovascular:     Rate and Rhythm: Normal rate and regular rhythm.     Heart sounds: Murmur heard.    Pulmonary:     Effort: Pulmonary effort is normal. No respiratory distress.     Breath sounds: Normal breath sounds.  Abdominal:     Palpations: Abdomen is soft.     Tenderness: There is no abdominal tenderness. There is no guarding or rebound.  Musculoskeletal:        General: No edema.     Comments: Tenderness to palpation over the right hip with passive  range of motion  Skin:    General: Skin is warm and dry.  Neurological:     Mental Status: She is alert.     Comments: This oriented to person, place, time. Only answers yes. She does not follow commands. She does move all four extremities symmetrically but weakly.  Psychiatric:        Mood and Affect: Mood and affect normal.     Comments: Anxious appearing     ED Results / Procedures / Treatments   Labs (all labs ordered are listed, but only abnormal results are displayed) Labs Reviewed  BASIC METABOLIC PANEL - Abnormal; Notable for the following components:      Result Value   CO2 18 (*)    Glucose, Bld 165 (*)    BUN 29 (*)    Creatinine, Ser 2.06 (*)    GFR, Estimated 22 (*)    Anion gap 18 (*)    All other components within normal limits  CBC - Abnormal; Notable for the following components:   WBC 16.9 (*)    RDW 17.8 (*)    All other components within normal limits  HEPATIC FUNCTION PANEL - Abnormal; Notable for the following components:   Albumin 3.2 (*)    Indirect Bilirubin 1.0 (*)    All other components within normal limits  CBG MONITORING, ED - Abnormal; Notable for the following components:   Glucose-Capillary 164 (*)    All other components within normal limits  URINE CULTURE  CULTURE, BLOOD (ROUTINE X 2)  CULTURE, BLOOD (ROUTINE X 2)  RESP PANEL BY RT-PCR (FLU A&B, COVID) ARPGX2  LACTIC ACID, PLASMA   URINALYSIS, ROUTINE W REFLEX MICROSCOPIC  LACTIC ACID, PLASMA    EKG EKG Interpretation  Date/Time:  Tuesday May 28 2020 18:19:28 EST Ventricular Rate:  70 PR Interval:    QRS Duration: 162 QT Interval:  480 QTC Calculation: 518 R Axis:   -71 Text Interpretation: Ventricular-paced rhythm Abnormal ECG Confirmed by Quintella Reichert [id number]) on 05/28/2020 7:13:09 PM   Radiology DG Chest 2 View  Result Date: 05/28/2020 CLINICAL DATA:  Hypoxia EXAM: CHEST - 2 VIEW COMPARISON:  06/14/2019 FINDINGS: Unchanged markedly widened upper mediastinum consistent with known thoracic aortic aneurysm. Unchanged position of left chest wall pacemaker leads. Remote median sternotomy. There is pulmonary vascular congestion without overt edema. No pleural effusion or pneumothorax. No focal airspace consolidation. IMPRESSION: 1. Pulmonary vascular congestion without overt edema. 2. Unchanged appearance of known thoracic aortic aneurysm. Electronically Signed   By: Ulyses Jarred M.D.   On: 05/28/2020 19:17   CT HEAD WO CONTRAST  Result Date: 05/28/2020 CLINICAL DATA:  Dementia, tremors, difficulty standing EXAM: CT HEAD WITHOUT CONTRAST TECHNIQUE: Contiguous axial images were obtained from the base of the skull through the vertex without intravenous contrast. COMPARISON:  12/26/2019 FINDINGS: Brain: Stable chronic small-vessel ischemic changes are seen throughout the periventricular white matter and bilateral basal ganglia. No signs of acute infarct or hemorrhage. Lateral ventricles and midline structures are stable. No acute extra-axial fluid collections. No mass effect. Vascular: No hyperdense vessel or unexpected calcification. Skull: Normal. Negative for fracture or focal lesion. Sinuses/Orbits: No acute finding. Other: None. IMPRESSION: 1. Stable chronic small vessel ischemic changes. No acute intracranial process. Electronically Signed   By: Randa Ngo M.D.   On: 05/28/2020 20:25   CT Cervical Spine Wo  Contrast  Result Date: 05/28/2020 CLINICAL DATA:  Neck trauma, weakness EXAM: CT CERVICAL SPINE WITHOUT CONTRAST TECHNIQUE: Multidetector CT imaging of the  cervical spine was performed without intravenous contrast. Multiplanar CT image reconstructions were also generated. COMPARISON:  12/27/2019 FINDINGS: Alignment: Stable mild anterolisthesis of C3 on C4 and C4 on C5. Otherwise alignment is anatomic. Skull base and vertebrae: No acute fracture. No primary bone lesion or focal pathologic process. Soft tissues and spinal canal: No prevertebral fluid or swelling. No visible canal hematoma. Disc levels: Mild diffuse facet hypertrophic changes throughout the cervical spine. Prominent cervical spondylosis at C5-6, with mild symmetrical neural foraminal encroachment. Upper chest: Airway is patent. Lung apices are clear. Partial visualization of a known thoracic aortic aneurysm. Other: Reconstructed images demonstrate no additional findings. IMPRESSION: 1. No acute cervical spine fracture. 2. Stable multilevel cervical facet hypertrophy and spondylosis, most pronounced at C5-6. 3. Stable partial visualization of a known thoracic aortic aneurysm. Electronically Signed   By: Randa Ngo M.D.   On: 05/28/2020 19:51   DG HIP UNILAT WITH PELVIS 2-3 VIEWS LEFT  Result Date: 05/28/2020 CLINICAL DATA:  Difficulty standing. EXAM: DG HIP (WITH OR WITHOUT PELVIS) 2-3V LEFT COMPARISON:  Radiograph 12/11/2019 FINDINGS: Left hip arthroplasty in expected alignment. No periprosthetic lucency or fracture. Bones are diffusely under mineralized. The pubic rami are intact. Right hip better assessed on concurrent right hip exam, reported separately. There are vascular calcifications. IMPRESSION: Left hip arthroplasty in expected alignment without complication. Electronically Signed   By: Keith Rake M.D.   On: 05/28/2020 20:46   DG HIP UNILAT WITH PELVIS 2-3 VIEWS RIGHT  Result Date: 05/28/2020 CLINICAL DATA:  Weakness and  difficulty standing. EXAM: DG HIP (WITH OR WITHOUT PELVIS) 2-3V RIGHT COMPARISON:  12/10/2019 FINDINGS: Advanced right hip degenerative change. Complete right hip joint space loss with flattening and remodeling of the femoral head, subchondral cystic change in bony fragmentation. Findings are chronic and not significantly changed. No acute fracture. Pubic rami are intact. IMPRESSION: Advanced right hip degenerative change without acute abnormality. Electronically Signed   By: Keith Rake M.D.   On: 05/28/2020 20:47    Procedures Procedures   Medications Ordered in ED Medications  cefTRIAXone (ROCEPHIN) 1 g in sodium chloride 0.9 % 100 mL IVPB (1 g Intravenous New Bag/Given 05/28/20 2334)  fentaNYL (SUBLIMAZE) injection 25 mcg (25 mcg Intravenous Given 05/28/20 2246)    ED Course  I have reviewed the triage vital signs and the nursing notes.  Pertinent labs & imaging results that were available during my care of the patient were reviewed by me and considered in my medical decision making (see chart for details).    MDM Rules/Calculators/A&P                         patient with severe dementia here for evaluation of change in mental status that occurred today in setting of bowel incontinence. She did have a fall one week ago but did not appear to sustain any injuries at that time. Evaluation is limited due to her severe dementia and anxiety. She does appear to have some pain on her right hip. Imaging is negative for fracture but is limited secondary to severe arthritis. Daughter states that she has baseline severe pain in his hip. Patient with intermittent episodes of hypoxia that quickly resolved without any intervention. Lungs are clear on examination. Chest x-ray with stable, large aortic aneurysm and possible pulmonary vascular congestion. Labs significant for progressive renal insufficiency. CBC with leukocytosis. Concern for UTI versus developing pneumonia, will start antibiotics. CT head,  C-spine are negative for acute intracranial  abnormality. Plan to admit to the medicine service for ongoing treatment and evaluation.  Final Clinical Impression(s) / ED Diagnoses Final diagnoses:  Fall  Weakness    Rx / DC Orders ED Discharge Orders    None       Quintella Reichert, MD 05/28/20 2351

## 2020-05-28 NOTE — ED Notes (Signed)
O2 stats  @ 94.

## 2020-05-28 NOTE — ED Notes (Signed)
SPO2 monitor repositioned for better read, SPO2 97%RA

## 2020-05-28 NOTE — ED Triage Notes (Signed)
Came from NH; since yesterday, have had difficulty standing and some shaking. Hx of dementia, pacemaker. RA sp02 at 88% EMS placed on 2L [place], improved to 96%

## 2020-05-29 ENCOUNTER — Inpatient Hospital Stay (HOSPITAL_COMMUNITY): Payer: Medicare Other

## 2020-05-29 DIAGNOSIS — I5031 Acute diastolic (congestive) heart failure: Secondary | ICD-10-CM

## 2020-05-29 DIAGNOSIS — R531 Weakness: Secondary | ICD-10-CM

## 2020-05-29 DIAGNOSIS — R0602 Shortness of breath: Secondary | ICD-10-CM

## 2020-05-29 DIAGNOSIS — W19XXXA Unspecified fall, initial encounter: Secondary | ICD-10-CM

## 2020-05-29 DIAGNOSIS — G9341 Metabolic encephalopathy: Secondary | ICD-10-CM

## 2020-05-29 DIAGNOSIS — I712 Thoracic aortic aneurysm, without rupture, unspecified: Secondary | ICD-10-CM

## 2020-05-29 DIAGNOSIS — R7989 Other specified abnormal findings of blood chemistry: Secondary | ICD-10-CM

## 2020-05-29 DIAGNOSIS — J9601 Acute respiratory failure with hypoxia: Secondary | ICD-10-CM

## 2020-05-29 DIAGNOSIS — M7989 Other specified soft tissue disorders: Secondary | ICD-10-CM

## 2020-05-29 DIAGNOSIS — I5041 Acute combined systolic (congestive) and diastolic (congestive) heart failure: Secondary | ICD-10-CM

## 2020-05-29 DIAGNOSIS — N179 Acute kidney failure, unspecified: Secondary | ICD-10-CM

## 2020-05-29 DIAGNOSIS — N189 Chronic kidney disease, unspecified: Secondary | ICD-10-CM

## 2020-05-29 LAB — I-STAT ARTERIAL BLOOD GAS, ED
Acid-Base Excess: 4 mmol/L — ABNORMAL HIGH (ref 0.0–2.0)
Bicarbonate: 27.1 mmol/L (ref 20.0–28.0)
Calcium, Ion: 1.18 mmol/L (ref 1.15–1.40)
HCT: 36 % (ref 36.0–46.0)
Hemoglobin: 12.2 g/dL (ref 12.0–15.0)
O2 Saturation: 96 %
Patient temperature: 98.4
Potassium: 4 mmol/L (ref 3.5–5.1)
Sodium: 138 mmol/L (ref 135–145)
TCO2: 28 mmol/L (ref 22–32)
pCO2 arterial: 36.1 mmHg (ref 32.0–48.0)
pH, Arterial: 7.482 — ABNORMAL HIGH (ref 7.350–7.450)
pO2, Arterial: 72 mmHg — ABNORMAL LOW (ref 83.0–108.0)

## 2020-05-29 LAB — BASIC METABOLIC PANEL
Anion gap: 15 (ref 5–15)
BUN: 31 mg/dL — ABNORMAL HIGH (ref 8–23)
CO2: 21 mmol/L — ABNORMAL LOW (ref 22–32)
Calcium: 9 mg/dL (ref 8.9–10.3)
Chloride: 102 mmol/L (ref 98–111)
Creatinine, Ser: 1.85 mg/dL — ABNORMAL HIGH (ref 0.44–1.00)
GFR, Estimated: 25 mL/min — ABNORMAL LOW (ref >60)
Glucose, Bld: 149 mg/dL — ABNORMAL HIGH (ref 70–99)
Potassium: 3.9 mmol/L (ref 3.5–5.1)
Sodium: 138 mmol/L (ref 135–145)

## 2020-05-29 LAB — TROPONIN I (HIGH SENSITIVITY)
Troponin I (High Sensitivity): 290 ng/L (ref <18)
Troponin I (High Sensitivity): 243 ng/L (ref <18)

## 2020-05-29 LAB — RESP PANEL BY RT-PCR (FLU A&B, COVID) ARPGX2
Influenza A by PCR: NEGATIVE
Influenza B by PCR: NEGATIVE
SARS Coronavirus 2 by RT PCR: NEGATIVE

## 2020-05-29 LAB — URINALYSIS, ROUTINE W REFLEX MICROSCOPIC
Bilirubin Urine: NEGATIVE
Glucose, UA: NEGATIVE mg/dL
Hgb urine dipstick: NEGATIVE
Ketones, ur: 5 mg/dL — AB
Leukocytes,Ua: NEGATIVE
Nitrite: NEGATIVE
Protein, ur: NEGATIVE mg/dL
Specific Gravity, Urine: 1.02 (ref 1.005–1.030)
pH: 5 (ref 5.0–8.0)

## 2020-05-29 LAB — ECHOCARDIOGRAM COMPLETE
AR max vel: 1.1 cm2
AV Area VTI: 1.14 cm2
AV Area mean vel: 1.07 cm2
AV Mean grad: 7.5 mmHg
AV Peak grad: 14.4 mmHg
Ao pk vel: 1.9 m/s
Area-P 1/2: 3.54 cm2
Height: 63 in
S' Lateral: 3.7 cm
Weight: 2592 oz

## 2020-05-29 LAB — D-DIMER, QUANTITATIVE: D-Dimer, Quant: >20 ug/mL-FEU — ABNORMAL HIGH (ref 0.00–0.50)

## 2020-05-29 LAB — CBC
HCT: 37.6 % (ref 36.0–46.0)
Hemoglobin: 11.8 g/dL — ABNORMAL LOW (ref 12.0–15.0)
MCH: 27.9 pg (ref 26.0–34.0)
MCHC: 31.4 g/dL (ref 30.0–36.0)
MCV: 88.9 fL (ref 80.0–100.0)
Platelets: 196 10*3/uL (ref 150–400)
RBC: 4.23 MIL/uL (ref 3.87–5.11)
RDW: 17.9 % — ABNORMAL HIGH (ref 11.5–15.5)
WBC: 12.2 10*3/uL — ABNORMAL HIGH (ref 4.0–10.5)
nRBC: 0 % (ref 0.0–0.2)

## 2020-05-29 LAB — HEPARIN LEVEL (UNFRACTIONATED): Heparin Unfractionated: <0.1 IU/mL — ABNORMAL LOW (ref 0.30–0.70)

## 2020-05-29 LAB — AMMONIA: Ammonia: 15 umol/L (ref 9–35)

## 2020-05-29 LAB — TSH: TSH: 2.103 u[IU]/mL (ref 0.350–4.500)

## 2020-05-29 LAB — CREATININE, URINE, RANDOM: Creatinine, Urine: 215.29 mg/dL

## 2020-05-29 LAB — VITAMIN B12: Vitamin B-12: 168 pg/mL — ABNORMAL LOW (ref 180–914)

## 2020-05-29 LAB — SODIUM, URINE, RANDOM: Sodium, Ur: <10 mmol/L

## 2020-05-29 LAB — LACTIC ACID, PLASMA: Lactic Acid, Venous: 1.3 mmol/L (ref 0.5–1.9)

## 2020-05-29 LAB — PROCALCITONIN: Procalcitonin: 1.14 ng/mL

## 2020-05-29 LAB — BRAIN NATRIURETIC PEPTIDE: B Natriuretic Peptide: 562.1 pg/mL — ABNORMAL HIGH (ref 0.0–100.0)

## 2020-05-29 MED ORDER — HEPARIN BOLUS VIA INFUSION
3500.0000 [IU] | Freq: Once | INTRAVENOUS | Status: AC
  Administered 2020-05-29: 3500 [IU] via INTRAVENOUS
  Filled 2020-05-29: qty 3500

## 2020-05-29 MED ORDER — ACETAMINOPHEN 650 MG RE SUPP
650.0000 mg | Freq: Four times a day (QID) | RECTAL | Status: DC | PRN
  Administered 2020-05-30: 650 mg via RECTAL
  Filled 2020-05-29: qty 1

## 2020-05-29 MED ORDER — FUROSEMIDE 10 MG/ML IJ SOLN
20.0000 mg | Freq: Once | INTRAMUSCULAR | Status: AC
  Administered 2020-05-29: 20 mg via INTRAVENOUS
  Filled 2020-05-29: qty 2

## 2020-05-29 MED ORDER — HEPARIN (PORCINE) 25000 UT/250ML-% IV SOLN
1250.0000 [IU]/h | INTRAVENOUS | Status: DC
  Administered 2020-05-29: 07:00:00 1050 [IU]/h via INTRAVENOUS
  Administered 2020-05-30 (×2): 1250 [IU]/h via INTRAVENOUS
  Filled 2020-05-29 (×3): qty 250

## 2020-05-29 MED ORDER — TECHNETIUM TO 99M ALBUMIN AGGREGATED
4.1000 | Freq: Once | INTRAVENOUS | Status: AC | PRN
  Administered 2020-05-29: 4.1 via INTRAVENOUS

## 2020-05-29 MED ORDER — HEPARIN SODIUM (PORCINE) 5000 UNIT/ML IJ SOLN: 5000.0000 [IU] | Freq: Three times a day (TID) | INTRAMUSCULAR | Status: DC

## 2020-05-29 MED ORDER — FENTANYL CITRATE (PF) 100 MCG/2ML IJ SOLN
12.5000 ug | INTRAMUSCULAR | Status: DC | PRN
Start: 2020-05-29 — End: 2020-05-30
  Administered 2020-05-29 – 2020-05-30 (×4): 12.5 ug via INTRAVENOUS
  Filled 2020-05-29 (×4): qty 2

## 2020-05-29 MED ORDER — FUROSEMIDE 10 MG/ML IJ SOLN
20.0000 mg | Freq: Two times a day (BID) | INTRAMUSCULAR | Status: DC
  Administered 2020-05-29 – 2020-05-30 (×3): 20 mg via INTRAVENOUS
  Filled 2020-05-29 (×3): qty 2

## 2020-05-29 MED ORDER — ACETAMINOPHEN 325 MG PO TABS
650.0000 mg | ORAL_TABLET | Freq: Four times a day (QID) | ORAL | Status: DC | PRN
  Administered 2020-05-29: 650 mg via ORAL
  Filled 2020-05-29: qty 2

## 2020-05-29 NOTE — H&P (Addendum)
History and Physical    DEMEKA SUTTER SWF:[id number] DOB: October 17, 1923 DOA: 05/28/2020  PCP: Binnie Rail, MD Patient coming from: Nursing home  Chief Complaint: Altered mental status  HPI: [name] is a 87 y.o. female with medical history significant of advanced dementia, CKD stage III, paroxysmal A. fib not on anticoagulation, CAD status post CABG, carotid artery stenosis, complete heart block status post PPM 2013, hypertension, hyperlipidemia, thoracic aortic aneurysm status post repair 2013, bioprosthetic AVR in 2003 presenting to the ED via EMS for evaluation of altered mental status.  Satting 88% on room air with EMS, placed on 2 L supplemental oxygen and sats improved to 96%.  No history could be obtained from the patient given her advanced dementia.  Daughter-in-law at bedside states patient resides at the memory care unit at [place].  States she has advanced dementia and is disoriented at baseline.  She usually responds to questions only by saying yes or no.  She is normally able to ambulate with a walker but today staff at her nursing facility noticed that she was very lethargic and not able to walk.  They noticed that she was having difficulty breathing, appeared pale, and her lips were purple.  Daughter-in-law states patient had a fall about a week ago at her facility but they were told she fell on the carpet and did not sustain any injuries.  States patient has constantly complaining of pain in her right hip which is a chronic problem.  ED Course: Slightly tachypneic.  Oxygen saturation intermittently dropping to the mid/upper 80s at rest but would improve to the 90s immediately. Afebrile.  Not tachycardic or hypotensive.  Labs showing WBC 16.9, hemoglobin 12.7, platelet count 225K.  Sodium 137, potassium 4.3, chloride 101, bicarb 18, anion gap 18, BUN 29, creatinine 2.0, glucose 165.  UA and urine culture pending.  Lactic acid 1.9.  Blood culture x2 pending.  LFTs normal.   SARS-CoV-2 PCR test negative.  Influenza panel negative.  Chest x-ray showing pulmonary vascular congestion without overt edema.  CT head and C-spine negative for acute finding.  X-ray of right hip/pelvis showing advanced degenerative change without acute abnormality.  X-ray of left hip/pelvis showing left hip arthroplasty in expected alignment without complication.  CT of right hip showing severe degenerative arthritis and no acute finding.  Patient was given ceftriaxone.  Review of Systems:  All systems reviewed and apart from history of presenting illness, are negative.  Past Medical History:  Diagnosis Date  . Anemia, unspecified   . Anxiety state, unspecified   . Atrial fibrillation (HCC)    not anticoagulation candidate due to falls  . CAD (coronary artery disease)    s/p CABG  . Carotid artery stenosis   . Complete heart block ([place]) 12/2011   s/p St.Jude dual chamber PPM 01/12/12  . Dementia ([place])   . Dementia ([place]) 05/09/2017  . Diverticulosis of colon (without mention of hemorrhage)   . Esophageal reflux   . HTN (hypertension)   . Hyperlipidemia   . Osteoarthrosis, unspecified whether generalized or localized, unspecified site   . Osteoporosis, unspecified   . Pacemaker-St.Jude 01/13/2012  . S/P AVR (aortic valve replacement) 2003   bioprosthetic   . Thoracic aortic aneurysm ([place]) 2003   s/p repair  . Unspecified chronic bronchitis ([place])     Past Surgical History:  Procedure Laterality Date  . AORTIC VALVE REPLACEMENT  2003  . CATARACT EXTRACTION     bilateral  . CORONARY ARTERY  BYPASS GRAFT    . INTRAOCULAR LENS INSERTION     bilateral  . PACEMAKER INSERTION  01/12/12   SJM Accent DR RF implanted by Dr Rayann Heman for CHB  . PERMANENT PACEMAKER INSERTION N/A 01/12/2012   Procedure: PERMANENT PACEMAKER INSERTION;  Surgeon: Thompson Grayer, MD;  Location: [facility] CATH LAB;  Service: Cardiovascular;  Laterality: N/A;  . [place]   left  .  VESICOVAGINAL FISTULA CLOSURE W/ TAH       reports that she has never smoked. She has never used smokeless tobacco. She reports that she does not drink alcohol and does not use drugs.  Allergies  Allergen Reactions  . Coumadin [Warfarin Sodium] Other (See Comments)    hallucinations   . Clarithromycin Nausea Only and Other (See Comments)  . Iohexol      Desc: sob with ivp approx 20 yrs ago, pt was premedicated with 13hr prep with no problems kdean, Onset Date: [id number]   . Nsaids Nausea Only  . Penicillins Other (See Comments)    Abdominal pain  . Prednisone Nausea Only and Swelling  . Statins Other (See Comments)    REACTION: abd pain and dark stools  . Sulfonamide Derivatives Nausea Only and Other (See Comments)    Abdominal pain     Family History  Problem Relation Age of Onset  . Diabetes Brother   . Diabetes Sister     Prior to Admission medications   Medication Sig Start Date End Date Taking? Authorizing Provider  acetaminophen (TYLENOL) 325 MG tablet Take 650 mg by mouth every 6 (six) hours as needed (pain).     [provider]  alendronate (FOSAMAX) 70 MG tablet TAKE 1 TABLET BY MOUTH EVERY 7 DAYS, TAKE WITH A FULL GLASS OF WATER ON AN EMPTY STOMACH 11/18/18   Binnie Rail, MD  aspirin EC 81 MG tablet Take 1 tablet (81 mg total) by mouth daily. 03/21/18   Lelon Perla, MD  cholecalciferol (VITAMIN D) 1000 UNITS tablet Take 1,000 Units by mouth daily with breakfast.     [provider]  citalopram (CELEXA) 20 MG tablet TAKE 1 TABLET BY MOUTH ONCE DAILY AT  6PM 08/17/18   Binnie Rail, MD  folic acid (FOLVITE) 1 MG tablet Take 1 tablet (1 mg total) by mouth daily. 11/16/18   Binnie Rail, MD  furosemide (LASIX) 40 MG tablet Take 2 tablets by mouth once daily 11/15/18   Binnie Rail, MD  Lidocaine HCl (LIDOCAINE PLUS) 4 % CREA Apply 1 application topically 3 (three) times daily.    [provider]  nystatin (MYCOSTATIN/NYSTOP) powder  Apply topically 4 (four) times daily. 06/08/18   Binnie Rail, MD  Petrolatum-Zinc Oxide 49-15 % OINT Apply to affected area often until rash has resolved, then d/c 12/09/18   Binnie Rail, MD  pravastatin (PRAVACHOL) 40 MG tablet Take 1 tablet (40 mg total) by mouth at bedtime. 06/30/18   Binnie Rail, MD  risperiDONE (RISPERDAL) 0.25 MG tablet Take 1 tablet (0.25 mg total) by mouth at bedtime. 06/30/18   Binnie Rail, MD    Physical Exam: Vitals:   05/28/20 2155 05/28/20 2200 05/28/20 2315 05/28/20 2344  BP: 98/75 117/83 129/65   Pulse: 69 70 70 70  Resp: 18 14 17 14   Temp:      SpO2: 91% 90% 93% 97%    Physical Exam Constitutional:      General: She is not in acute  distress.    Appearance: She is not diaphoretic.  HENT:     Head: Normocephalic.     Mouth/Throat:     Mouth: Mucous membranes are dry.  Eyes:     Extraocular Movements: Extraocular movements intact.     Conjunctiva/sclera: Conjunctivae normal.  Cardiovascular:     Rate and Rhythm: Normal rate and regular rhythm.     Pulses: Normal pulses.  Pulmonary:     Breath sounds: Normal breath sounds. No wheezing or rales.     Comments: Slightly tachypneic Satting in the upper 90s on 1 L supplemental oxygen Abdominal:     General: Bowel sounds are normal. There is no distension.     Palpations: Abdomen is soft.     Tenderness: There is no abdominal tenderness. There is no guarding.  Musculoskeletal:        General: No swelling or tenderness.     Cervical back: Normal range of motion and neck supple.  Skin:    General: Skin is warm and dry.  Neurological:     Mental Status: Mental status is at baseline.     Labs on Admission: I have personally reviewed following labs and imaging studies  CBC: Recent Labs  Lab 05/28/20 1826  WBC 16.9*  HGB 12.7  HCT 40.2  MCV 88.4  PLT 993   Basic Metabolic Panel: Recent Labs  Lab 05/28/20 1826  NA 137  K 4.3  CL 101  CO2 18*  GLUCOSE 165*  BUN 29*  CREATININE  2.06*  CALCIUM 9.5   GFR: CrCl cannot be calculated (Unknown ideal weight.). Liver Function Tests: Recent Labs  Lab 05/28/20 2125  AST 31  ALT 17  ALKPHOS 88  BILITOT 1.2  PROT 7.1  ALBUMIN 3.2*   No results for input(s): LIPASE, AMYLASE in the last 168 hours. No results for input(s): AMMONIA in the last 168 hours. Coagulation Profile: No results for input(s): INR, PROTIME in the last 168 hours. Cardiac Enzymes: No results for input(s): CKTOTAL, CKMB, CKMBINDEX, TROPONINI in the last 168 hours. BNP (last 3 results) No results for input(s): PROBNP in the last 8760 hours. HbA1C: No results for input(s): HGBA1C in the last 72 hours. CBG: Recent Labs  Lab 05/28/20 2003  GLUCAP 164*   Lipid Profile: No results for input(s): CHOL, HDL, LDLCALC, TRIG, CHOLHDL, LDLDIRECT in the last 72 hours. Thyroid Function Tests: No results for input(s): TSH, T4TOTAL, FREET4, T3FREE, THYROIDAB in the last 72 hours. Anemia Panel: No results for input(s): VITAMINB12, FOLATE, FERRITIN, TIBC, IRON, RETICCTPCT in the last 72 hours. Urine analysis:    Component Value Date/Time   COLORURINE YELLOW 09/21/2018 1458   APPEARANCEUR CLEAR 09/21/2018 1458   LABSPEC 1.020 09/21/2018 1458   PHURINE 5.0 09/21/2018 1458   GLUCOSEU NEGATIVE 09/21/2018 1458   HGBUR NEGATIVE 09/21/2018 1458   BILIRUBINUR NEGATIVE 09/21/2018 1458   BILIRUBINUR neg 09/10/2017 1342   KETONESUR NEGATIVE 09/21/2018 1458   PROTEINUR Negative 09/10/2017 [place] 05/09/2017 2006   UROBILINOGEN 0.2 09/21/2018 1458   NITRITE POSITIVE (A) 09/21/2018 1458   LEUKOCYTESUR NEGATIVE 09/21/2018 1458    Radiological Exams on Admission: DG Chest 2 View  Result Date: 05/28/2020 CLINICAL DATA:  Hypoxia EXAM: CHEST - 2 VIEW COMPARISON:  06/14/2019 FINDINGS: Unchanged markedly widened upper mediastinum consistent with known thoracic aortic aneurysm. Unchanged position of left chest wall pacemaker leads. Remote median  sternotomy. There is pulmonary vascular congestion without overt edema. No pleural effusion or pneumothorax. No focal airspace  consolidation. IMPRESSION: 1. Pulmonary vascular congestion without overt edema. 2. Unchanged appearance of known thoracic aortic aneurysm. Electronically Signed   By: Ulyses Jarred M.D.   On: 05/28/2020 19:17   CT HEAD WO CONTRAST  Result Date: 05/28/2020 CLINICAL DATA:  Dementia, tremors, difficulty standing EXAM: CT HEAD WITHOUT CONTRAST TECHNIQUE: Contiguous axial images were obtained from the base of the skull through the vertex without intravenous contrast. COMPARISON:  12/26/2019 FINDINGS: Brain: Stable chronic small-vessel ischemic changes are seen throughout the periventricular white matter and bilateral basal ganglia. No signs of acute infarct or hemorrhage. Lateral ventricles and midline structures are stable. No acute extra-axial fluid collections. No mass effect. Vascular: No hyperdense vessel or unexpected calcification. Skull: Normal. Negative for fracture or focal lesion. Sinuses/Orbits: No acute finding. Other: None. IMPRESSION: 1. Stable chronic small vessel ischemic changes. No acute intracranial process. Electronically Signed   By: Randa Ngo M.D.   On: 05/28/2020 20:25   CT Cervical Spine Wo Contrast  Result Date: 05/28/2020 CLINICAL DATA:  Neck trauma, weakness EXAM: CT CERVICAL SPINE WITHOUT CONTRAST TECHNIQUE: Multidetector CT imaging of the cervical spine was performed without intravenous contrast. Multiplanar CT image reconstructions were also generated. COMPARISON:  12/27/2019 FINDINGS: Alignment: Stable mild anterolisthesis of C3 on C4 and C4 on C5. Otherwise alignment is anatomic. Skull base and vertebrae: No acute fracture. No primary bone lesion or focal pathologic process. Soft tissues and spinal canal: No prevertebral fluid or swelling. No visible canal hematoma. Disc levels: Mild diffuse facet hypertrophic changes throughout the cervical spine.  Prominent cervical spondylosis at C5-6, with mild symmetrical neural foraminal encroachment. Upper chest: Airway is patent. Lung apices are clear. Partial visualization of a known thoracic aortic aneurysm. Other: Reconstructed images demonstrate no additional findings. IMPRESSION: 1. No acute cervical spine fracture. 2. Stable multilevel cervical facet hypertrophy and spondylosis, most pronounced at C5-6. 3. Stable partial visualization of a known thoracic aortic aneurysm. Electronically Signed   By: Randa Ngo M.D.   On: 05/28/2020 19:51   CT Hip Right Wo Contrast  Result Date: 05/29/2020 CLINICAL DATA:  Right hip pain EXAM: CT OF THE RIGHT HIP WITHOUT CONTRAST TECHNIQUE: Multidetector CT imaging of the right hip was performed according to the standard protocol. Multiplanar CT image reconstructions were also generated. COMPARISON:  None. FINDINGS: Bones/Joint/Cartilage Left total hip arthroplasty has been performed. The pelvis and right hip are intact. No acute fracture or dislocation. There is severe right hip degenerative arthritis with complete loss of the joint space, remodeling of the femoral head and acetabulum, extensive osteophyte formation, and subchondral sclerosis and cyst formation. The osseous structures are diffusely osteopenic. Degenerative changes are noted within the visualized lumbar spine. Ligaments Suboptimally assessed by CT. Muscles and Tendons There is asymmetric atrophy and fatty infiltration of the right gluteal and tensor fascia lata musculature. The ileo psoas musculature is markedly atrophic bilaterally though the iliopsoas tendon appears intact. Gluteal tendon and hamstring tendons appear intact. Soft tissues The visualized bowel is unremarkable. No free fluid within the pelvis. Bladder is unremarkable. Uterus absent. No adnexal masses. Soft tissues of the body wall are unremarkable. IMPRESSION: No acute fracture or dislocation of the right hip. Severe right hip degenerative  arthritis with acetabular and femoral head remodeling. Electronically Signed   By: Fidela Salisbury MD   On: 05/29/2020 00:29   DG HIP UNILAT WITH PELVIS 2-3 VIEWS LEFT  Result Date: 05/28/2020 CLINICAL DATA:  Difficulty standing. EXAM: DG HIP (WITH OR WITHOUT PELVIS) 2-3V LEFT COMPARISON:  Radiograph  12/11/2019 FINDINGS: Left hip arthroplasty in expected alignment. No periprosthetic lucency or fracture. Bones are diffusely under mineralized. The pubic rami are intact. Right hip better assessed on concurrent right hip exam, reported separately. There are vascular calcifications. IMPRESSION: Left hip arthroplasty in expected alignment without complication. Electronically Signed   By: Keith Rake M.D.   On: 05/28/2020 20:46   DG HIP UNILAT WITH PELVIS 2-3 VIEWS RIGHT  Result Date: 05/28/2020 CLINICAL DATA:  Weakness and difficulty standing. EXAM: DG HIP (WITH OR WITHOUT PELVIS) 2-3V RIGHT COMPARISON:  12/10/2019 FINDINGS: Advanced right hip degenerative change. Complete right hip joint space loss with flattening and remodeling of the femoral head, subchondral cystic change in bony fragmentation. Findings are chronic and not significantly changed. No acute fracture. Pubic rami are intact. IMPRESSION: Advanced right hip degenerative change without acute abnormality. Electronically Signed   By: Keith Rake M.D.   On: 05/28/2020 20:47    EKG: Independently reviewed.  Interpretation limited secondary to paced rhythm.  Assessment/Plan Principal Problem:   Acute hypoxemic respiratory failure (HCC) Active Problems:   Acute metabolic encephalopathy   Generalized weakness   Acute-on-chronic kidney injury ([place])   Fall   Acute hypoxemic respiratory failure: In the ED, oxygen saturation intermittently dropping to the mid/upper 80s at rest but would improve to the 90s immediately.  At present, she is on 1 L supplemental oxygen via nasal cannula and satting in the upper 90s.  Does appear mildly  tachypneic. Chest x-ray showing pulmonary vascular congestion without overt edema.  No recent echo results in the chart.  Last echocardiogram done in 2014 showing normal systolic function with LVEF 55 to 65%, moderate LVH, bioprosthetic aortic valve with mild stenosis. -Continuous pulse ox, continue supplemental oxygen.  Stat ABG, BNP, troponin, and D-dimer. If D-dimer elevated, start heparin and obtain VQ scan in the morning to rule out PE.  Unable to do CTA due to renal insufficiency.  Order echocardiogram.  Addendum:  -ABG with pH 7.48, PCO2 36, PO2 72.  Currently satting in the upper 90s, continue supplemental oxygen.  -BNP elevated at 562.  Blood pressure stable.  Creatinine slightly improved on repeat labs.  IV Lasix 20 mg x 1 ordered, please reassess in a.m. and order additional doses.  -D-dimer significantly elevated >20.0.  Unable to do CTA due to renal insufficiency.  VQ scan and bilateral lower extremity Dopplers ordered.  Start IV heparin. -High-sensitivity troponin elevated at 290, EKG with paced rhythm.  Unable to get any history from the patient given her advanced dementia.  ?Demand ischemia versus ACS.  Starting heparin given significantly elevated D-dimer and concern for PE.  Trend troponin, if it increases further, would be concerning for ACS.    Acute metabolic encephalopathy in the setting of advanced dementia: Currently awake but not fully alert and repeatedly saying "yes."  Per daughter-in-law, this is her baseline but her nursing facility was concerned that she was more lethargic today.  Head CT negative for acute finding.   -Order ABG given intermittent hypoxemia.  Check TSH, B12, and ammonia levels.  Avoid giving any opiates or sedating medications.  She does appear dehydrated with dry mucous membranes but given hypoxemia and chest x-ray findings concerning for pulmonary vascular congestion, will hold off giving IV fluid until BNP level is checked.  Generalized weakness, falls,  leukocytosis: Does have leukocytosis.  No fever, tachycardia, or hypotension to suggest sepsis.  Chest x-ray not suggestive of pneumonia.  Testing for Covid and influenza negative.  Head CT  negative.  No traumatic injuries identified on imaging including CT C-spine, x-ray of right hip/pelvis, x-ray of left hip/pelvis, and CT of right hip. -Patient was given a dose of ceftriaxone in the ED.  UA and urine culture pending.  Blood culture x2 pending.  Check procalcitonin level.  Second set of lactate pending.  PT/OT eval, fall precautions.  AKI on CKD stage III: Possibly prerenal from dehydration as she has dry mucous membranes on exam.  BUN 29, creatinine 2.0. Creatinine was 1.6 a year ago but previously baseline was around 1.2 in 2018. -Avoid nephrotoxic agents/contrast.  Monitor renal function closely. Given hypoxemia and chest x-ray findings concerning for pulmonary vascular congestion, will hold off giving IV fluid until BNP level is checked.  Check urine sodium and creatinine.  Mild high anion gap metabolic acidosis: Likely due to AKI.  Bicarb 18, anion gap 18.  Lactic acid normal. -Continue to monitor closely  Paroxysmal A. fib: Not on chronic anticoagulation due to advanced dementia and high fall risk. Complete heart block: Status post PPM. Hypertension: Currently normotensive. Hyperlipidemia Pharmacy med rec pending.  DVT prophylaxis: Subcutaneous heparin Code Status: DNR-discussed with the patient's son Mr. Lakisha Peyser.  Family Communication: Son updated over the phone and daughter-in-law at bedside. Disposition Plan: Status is: Inpatient  Remains inpatient appropriate because:Altered mental status and Inpatient level of care appropriate due to severity of illness   Dispo: The patient is from: SNF              Anticipated d/c is to: SNF              Patient currently is not medically stable to d/c.   Difficult to place patient No  Level of care: Level of care: Telemetry Medical    The medical decision making on this patient was of high complexity and the patient is at high risk for clinical deterioration, therefore this is a level 3 visit.  Shela Leff MD [facility]  If 7PM-7AM, please contact night-coverage [email or web address]  05/29/2020, 2:09 AM

## 2020-05-29 NOTE — Progress Notes (Signed)
Bilateral lower extremity venous study completed.      Please see CV Proc for preliminary results.   Yalitza Teed, RVT  

## 2020-05-29 NOTE — Progress Notes (Signed)
°  PROGRESS NOTE  Patient admitted earlier this morning. See H&P.   [name] is a 87 y.o. female with medical history significant of advanced dementia, CKD stage III, paroxysmal A. fib not on anticoagulation, CAD status post CABG, carotid artery stenosis, complete heart block status post PPM 2013, hypertension, hyperlipidemia, thoracic aortic aneurysm status post repair 2013, bioprosthetic AVR in 2003 presenting to the ED via EMS for evaluation of altered mental status.  She was satting 88% on room air with EMS, placed on 2 L supplemental oxygen and sats improved to 96%.  No history could be obtained from the patient given her advanced dementia.  Daughter-in-law at bedside states patient resides at the memory care unit at [place].  States she has advanced dementia and is disoriented at baseline.  She usually responds to questions only by saying yes or no.  She is normally able to ambulate with a walker but today staff at her nursing facility noticed that she was very lethargic and not able to walk.  They noticed that she was having difficulty breathing, appeared pale, and her lips were purple.    Patient evaluated in the emergency department.  She appears to be resting comfortably in bed with daughter-in-law at bedside.  She is normally able to answer questions yes/no but largely inappropriately.  She is able to recognize her family member, able to ambulate with assistance at baseline.  On examination, she answers yes to all of my questions, inappropriately so.  This is her baseline mentation with her advanced dementia.  A/P:   Acute hypoxemic respiratory failure -In the emergency department, oxygen saturation was 88% on room air -Currently on 2 L nasal cannula O2 -DVT ultrasound and V/Q scan negative for VTE  Elevated troponin -Echocardiogram reveals EF 45 to 50%, LV global hypokinesis -Troponin 290 --> 243  -Cardiology consulted -IV heparin started for elevated d-dimer and  VTE work up. Doubt extensive cardiac intervention indicated due to patient's advanced dementia   Leukocytosis -Without evidence of acute infection -UA negative  -CXR showed pulmonary vascular congestion without overt edema or airspace consolidation -Blood cultures pending -Improving   AKI on CKD stage IIIb -Baseline creatinine 1.6 a year ago -Improving   Paroxysmal atrial fibrillation -Not on chronic anticoagulation due to advanced dementia, high fall risk  Complete heart block -Status post PPM  Advanced dementia -At baseline, patient is able to say yes/no and recognize family members, ambulate with assistance  Generalized weakness and falls -PT OT   Status is: Inpatient  Remains inpatient appropriate because:Hemodynamically unstable, Ongoing diagnostic testing needed not appropriate for outpatient work up, IV treatments appropriate due to intensity of illness or inability to take PO and Inpatient level of care appropriate due to severity of illness   Dispo: The patient is from: Memory care unit              Anticipated d/c is to: Memory care unit              Patient currently is not medically stable to d/c.   Difficult to place patient No       Dessa Phi, DO [facility] 05/29/2020, 3:19 PM  Available via Epic secure chat 7am-7pm After these hours, please refer to coverage provider listed on [email or web address]

## 2020-05-29 NOTE — Progress Notes (Addendum)
ANTICOAGULATION CONSULT NOTE  Pharmacy Consult:  Heparin Indication:  Rule out ACS  Allergies  Allergen Reactions  . Coumadin [Warfarin Sodium] Other (See Comments)    hallucinations   . Clarithromycin Nausea Only and Other (See Comments)  . Iohexol      Desc: sob with ivp approx 20 yrs ago, pt was premedicated with 13hr prep with no problems kdean, Onset Date: [id number]   . Nsaids Nausea Only  . Penicillins Other (See Comments)    Abdominal pain  . Prednisone Nausea Only and Swelling  . Statins Other (See Comments)    REACTION: abd pain and dark stools  . Sulfonamide Derivatives Nausea Only and Other (See Comments)    Abdominal pain     Patient Measurements: Height: 5\' 3"  (160 cm) Weight: 73.5 kg (162 lb) IBW/kg (Calculated) : 52.4 Heparin Dosing Weight: 67.8 kg  Vital Signs: Temp: 98.4 F (36.9 C) (03/09 2120) Temp Source: Oral (03/09 2120) BP: 122/83 (03/09 2120) Pulse Rate: 81 (03/09 2120)  Labs: Recent Labs    05/28/20 1826 05/29/20 0239 05/29/20 0245 05/29/20 0534 05/29/20 1353  HGB 12.7 12.2 11.8*  --   --   HCT 40.2 36.0 37.6  --   --   PLT 225  --  196  --   --   HEPARINUNFRC  --   --   --   --  <0.10*  CREATININE 2.06*  --  1.85*  --   --   TROPONINIHS  --   --  290* 243*  --     Estimated Creatinine Clearance: 17.8 mL/min (A) (by C-G formula based on SCr of 1.85 mg/dL (H)).   Assessment: 87 yo lady presented with AMS.  D-dimer > 20 and IV heparin started for rule out VTE.  Both Doppler and VQ scan are negative.  Reached out to MD, continue IV heparin for now for elevated troponins.  Patient has aneurysmal dilatation of the ascending aorta.  Heparin level undetectable.  No issue with heparin infusion nor bleeding per RN.  Goal of Therapy:  Heparin level 0.3-0.7 units/ml Monitor platelets by anticoagulation protocol: Yes   Plan:  Increase heparin gtt to 1250 units/hr Check 8 hr heparin level Daily heparin level and CBC F/U AC plans in  AM  Derrick Orris D. Mina Marble, PharmD, BCPS, [place] 05/29/2020, 9:59 PM

## 2020-05-29 NOTE — Progress Notes (Signed)
ANTICOAGULATION CONSULT NOTE - Initial Consult  Pharmacy Consult for heparin Indication: pulmonary embolus, rule out  Allergies  Allergen Reactions  . Coumadin [Warfarin Sodium] Other (See Comments)    hallucinations   . Clarithromycin Nausea Only and Other (See Comments)  . Iohexol      Desc: sob with ivp approx 20 yrs ago, pt was premedicated with 13hr prep with no problems kdean, Onset Date: [id number]   . Nsaids Nausea Only  . Penicillins Other (See Comments)    Abdominal pain  . Prednisone Nausea Only and Swelling  . Statins Other (See Comments)    REACTION: abd pain and dark stools  . Sulfonamide Derivatives Nausea Only and Other (See Comments)    Abdominal pain     Patient Measurements: Height: 5\' 3"  (160 cm) Weight: 73.5 kg (162 lb) IBW/kg (Calculated) : 52.4 Heparin Dosing Weight: 67.8  Vital Signs: Temp: 98.4 F (36.9 C) (03/08 1816) BP: 127/72 (03/09 0530) Pulse Rate: 61 (03/09 0530)  Labs: Recent Labs    05/28/20 1826 05/29/20 0239 05/29/20 0245  HGB 12.7 12.2 11.8*  HCT 40.2 36.0 37.6  PLT 225  --  196  CREATININE 2.06*  --  1.85*  TROPONINIHS  --   --  290*    Estimated Creatinine Clearance: 17.8 mL/min (A) (by C-G formula based on SCr of 1.85 mg/dL (H)).   Medical History: Past Medical History:  Diagnosis Date  . Anemia, unspecified   . Anxiety state, unspecified   . Atrial fibrillation (HCC)    not anticoagulation candidate due to falls  . CAD (coronary artery disease)    s/p CABG  . Carotid artery stenosis   . Complete heart block ([place]) 12/2011   s/p St.Jude dual chamber PPM 01/12/12  . Dementia ([place])   . Dementia ([place]) 05/09/2017  . Diverticulosis of colon (without mention of hemorrhage)   . Esophageal reflux   . HTN (hypertension)   . Hyperlipidemia   . Osteoarthrosis, unspecified whether generalized or localized, unspecified site   . Osteoporosis, unspecified   . Pacemaker-St.Jude 01/13/2012  . S/P AVR (aortic valve  replacement) 2003   bioprosthetic   . Thoracic aortic aneurysm ([place]) 2003   s/p repair  . Unspecified chronic bronchitis (HCC)     Medications:  See med history  Assessment: 87 yo lady to start heparin for r/o PE.  She was not on anticoagulation PTA.  Hg 11.8, PTLC 196 Goal of Therapy:  Heparin level 0.3-0.7 units/ml Monitor platelets by anticoagulation protocol: Yes   Plan:  Heparin 3500 unit bolus and drip at 1050 units/hr Check heparin level 8 hours after start F/u VQ scan and dopplers  Kwynn Schlotter [place] 05/29/2020,5:49 AM

## 2020-05-29 NOTE — Consult Note (Signed)
Cardiology Consultation:   Patient ID: [name] MRN: [id number]; DOB: 03-20-1924  Admit date: 05/28/2020 Date of Consult: 05/29/2020  PCP:  Binnie Rail, MD   [place]  Cardiologist:  Kirk Ruths, MD  Advanced Practice Provider:  No care team member to display Electrophysiologist:  None  EP - Advanced Practice Provider:  No care team member to display       Patient Profile:   [name] is a 87 y.o. female with a hx of paroxysmal atrial fibrillation, CAD status post CABG, complete heart block status post AV replacement.,  Hypertension, hyperlipidemia, bioprosthetic aortic valve replacement, GERD thoracic ascending aortic aneurysm repair, carotid stenosis, and dementia who is being seen today for the evaluation of elevated troponin, acute systolic heart failure at the request of Dr. Maylene Roes.  History of Present Illness:   Ms. [name] was admitted 05/28/2020 with altered mental status.  She was noted to be hypoxic to 88% on room air.  She was unable to provide history due to [name] advanced dementia at baseline.  She reportedly only responds to questions with yes/no.  At baseline she is able to ambulate but was too lethargic to walk on the day of presentation.  She was also reportedly short of breath, pale and cyanotic.  Due to d-dimer >20, she had a work-up for DVT/PE that was negative.  V/Q scan was notable for thoracic aorta dilation.  High sensitivity troponin was elevated to 290-->243.  Therefore cardiology was consulted. EKG V paced.  BNP was 562.  She had an echo this hospitalization that revealed LVEF 45-50% with global global hypokinesis.   [name] bioprosthetic aortic valve was stable with a mean gradient of 7.5 mmHg.  Systolic function was reduced from 55 to 65% on 06/2012.  She had lower extremity Dopplers that were negative for DVT on 05/29/2020.  [name] pacemaker was last interrogated 03/2020 and revealed 7% atrial fibrillation burden.  It was otherwise unremarkable.  She  last saw Coletta Memos, NP on 03/2019 and was physically well, though she was struggling with depression with psychotic features.  She has been living at [facility] care and is a widow.  [name] daughter and son are at the bedside.  They haven't noted any LE edema. They note that [name] breathing is more labored than usual.  She is unable to reliably answer questions and responds "yes" to everything.   She had an aortic valve replacement and thoracic aortic aneurysm repair in 2003.  She last had stress testing 08/2006 which was negative for ischemia.  She has not been on anticoagulation for [name] atrial fibrillation due to falls.   Past Medical History:  Diagnosis Date  . Anemia, unspecified   . Anxiety state, unspecified   . Atrial fibrillation (HCC)    not anticoagulation candidate due to falls  . CAD (coronary artery disease)    s/p CABG  . Carotid artery stenosis   . Complete heart block ([place]) 12/2011   s/p St.Jude dual chamber PPM 01/12/12  . Dementia ([place])   . Dementia ([place]) 05/09/2017  . Diverticulosis of colon (without mention of hemorrhage)   . Esophageal reflux   . HTN (hypertension)   . Hyperlipidemia   . Osteoarthrosis, unspecified whether generalized or localized, unspecified site   . Osteoporosis, unspecified   . Pacemaker-St.Jude 01/13/2012  . S/P AVR (aortic valve replacement) 2003   bioprosthetic   . Thoracic aortic aneurysm ([place]) 2003   s/p repair  . Unspecified chronic  bronchitis [facility])     Past Surgical History:  Procedure Laterality Date  . AORTIC VALVE REPLACEMENT  2003  . CATARACT EXTRACTION     bilateral  . CORONARY ARTERY BYPASS GRAFT    . INTRAOCULAR LENS INSERTION     bilateral  . PACEMAKER INSERTION  01/12/12   SJM Accent DR RF implanted by Dr Rayann Heman for CHB  . PERMANENT PACEMAKER INSERTION N/A 01/12/2012   Procedure: PERMANENT PACEMAKER INSERTION;  Surgeon: Thompson Grayer, MD;  Location: [facility] CATH LAB;  Service: Cardiovascular;  Laterality: N/A;  . [place]   left  . VESICOVAGINAL FISTULA CLOSURE W/ TAH       Home Medications:  Prior to Admission medications   Medication Sig Start Date End Date Taking? Authorizing Provider  acetaminophen (TYLENOL) 325 MG tablet Take 650 mg by mouth every 6 (six) hours as needed (pain).    Yes [provider]  furosemide (LASIX) 40 MG tablet Take 2 tablets by mouth once daily 11/15/18  Yes Burns, Claudina Lick, MD  polyethylene glycol (MIRALAX / GLYCOLAX) 17 g packet Take 17 g by mouth daily.   Yes [provider]  pravastatin (PRAVACHOL) 40 MG tablet Take 1 tablet (40 mg total) by mouth at bedtime. 06/30/18  Yes Burns, Claudina Lick, MD  risperiDONE (RISPERDAL) 0.25 MG tablet Take 1 tablet (0.25 mg total) by mouth at bedtime. 06/30/18  Yes Binnie Rail, MD    Inpatient Medications: Scheduled Meds:  Continuous Infusions: . heparin 1,050 Units/hr (05/29/20 0644)   PRN Meds: acetaminophen **OR** acetaminophen, fentaNYL (SUBLIMAZE) injection  Allergies:    Allergies  Allergen Reactions  . Coumadin [Warfarin Sodium] Other (See Comments)    hallucinations   . Clarithromycin Nausea Only and Other (See Comments)  . Iohexol      Desc: sob with ivp approx 20 yrs ago, pt was premedicated with 13hr prep with no problems kdean, Onset Date: [id number]   . Nsaids Nausea Only  . Penicillins Other (See Comments)    Abdominal pain  . Prednisone Nausea Only and Swelling  . Statins Other (See Comments)    REACTION: abd pain and dark stools  . Sulfonamide Derivatives Nausea Only and Other (See Comments)    Abdominal pain     Social History:   Social History   Socioeconomic History  . Marital status: Married    Spouse name: [name]  . Number of children: Not on file  . Years of education: Not on file  . Highest education level: Not on file  Occupational History  . Occupation: retired  Tobacco Use  . Smoking status: Never Smoker  . Smokeless tobacco: Never Used  Vaping Use  .  Vaping Use: Never used  Substance and Sexual Activity  . Alcohol use: No  . Drug use: No  . Sexual activity: Not on file  Other Topics Concern  . Not on file  Social History Narrative   Lives with husband. Mother had kidney problems.  Father had coronary artery  disease.  Both died when the patient was young.  She does not know how old  they were.  She has one brother who died at age 54 of a CVA, although, he  had coronary artery disease and diabetes.  Another brother died at age 14 of  a stroke.  He also had coronary artery disease.  One sister is living, she  has coronary artery disease and diabetes and one sister has anginal symptoms. [name] sister has  a pacemaker (history palpitations).          Social Determinants of Health   Financial Resource Strain: Not on file  Food Insecurity: Not on file  Transportation Needs: Not on file  Physical Activity: Not on file  Stress: Not on file  Social Connections: Not on file  Intimate Partner Violence: Not on file    Family History:    Family History  Problem Relation Age of Onset  . Diabetes Brother   . Diabetes Sister      ROS:  Please see the history of present illness.  All other ROS reviewed and negative.     Physical Exam/Data:   Vitals:   05/29/20 1030 05/29/20 1238 05/29/20 1326 05/29/20 1400  BP: 130/71  (!) 143/105 125/78  Pulse: 85  89   Resp: 20  18   Temp:  98.8 F (37.1 C) 98.3 F (36.8 C)   TempSrc:  Oral    SpO2: 99%  96%   Weight:      Height:        Intake/Output Summary (Last 24 hours) at 05/29/2020 1730 Last data filed at 05/29/2020 1657 Gross per 24 hour  Intake 340 ml  Output -  Net 340 ml   Last 3 Weights 05/29/2020 02/28/2020 06/14/2019  Weight (lbs) 162 lb 162 lb 6.4 oz 180 lb  Weight (kg) 73.483 kg 73.664 kg 81.647 kg   VS:  BP 125/78 (BP Location: Left Arm)   Pulse 89   Temp 98.3 F (36.8 C)   Resp 18   Ht 5\' 3"  (1.6 m)   Wt 73.5 kg   SpO2 96%   BMI 28.70 kg/m  , BMI Body mass index is  28.7 kg/m. GENERAL:  Ill-appearing.  Belly breathing HEENT: Pupils equal round and reactive, fundi not visualized, oral mucosa unremarkable NECK:  + jugular venous distention, waveform within normal limits, carotid upstroke brisk and symmetric, no bruits LUNGS:  Clear to auscultation bilaterally HEART:  RRR.  PMI not displaced or sustained,S1 and S2 within normal limits, no S3, no S4, no clicks, no rubs, III/VI systolic murmur at the LUSB ABD:  Flat, positive bowel sounds normal in frequency in pitch, no bruits, no rebound, no guarding, no midline pulsatile mass, no hepatomegaly, no splenomegaly EXT:  2 plus pulses throughout, no edema, no cyanosis no clubbing SKIN:  No rashes no nodules NEURO:  Cranial nerves II through XII grossly intact, motor grossly intact throughout [facility]:  Answers yes to all questions.  Unable to assess  EKG:  The EKG was personally reviewed and demonstrates:  AS-VP.  Rate 70 bm Telemetry:  Telemetry was personally reviewed and demonstrates:  ASVP  Relevant CV Studies:  Echo 05/2020: 1. Left ventricular ejection fraction, by estimation, is 45 to 50%. The  left ventricle has mildly decreased function. The left ventricle  demonstrates global hypokinesis. Left ventricular diastolic parameters are  indeterminate.  2. Right ventricular systolic function is normal. The right ventricular  size is normal. There is normal pulmonary artery systolic pressure.  3. The mitral valve is normal in structure. No evidence of mitral valve  regurgitation.  4. The aortic valve has been repaired/replaced. Aortic valve  regurgitation is not visualized. There is a unknown valve present in the  aortic position. Procedure Date: 2003.   Echo 06/2012: Study Conclusions   - Left ventricle: The cavity size was mildly reduced. Wall  thickness was increased in a pattern of moderate LVH.  Systolic function was normal. The  estimated ejection  fraction was in the range of 55% to 65%.  Wall motion was  normal; there were no regional wall motion abnormalities.  There was an increased relative contribution of atrial  contraction to ventricular filling.  - Aortic valve: A bioprosthesis was present. There was very  mild stenosis. Mean gradient: 53mm Hg (S). Peak gradient:  84mm Hg (S).  - Left atrium: The atrium was mildly dilated.    Laboratory Data:  High Sensitivity Troponin:   Recent Labs  Lab 05/29/20 0245 05/29/20 0534  TROPONINIHS 290* 243*     Chemistry Recent Labs  Lab 05/28/20 1826 05/29/20 0239 05/29/20 0245  NA 137 138 138  K 4.3 4.0 3.9  CL 101  --  102  CO2 18*  --  21*  GLUCOSE 165*  --  149*  BUN 29*  --  31*  CREATININE 2.06*  --  1.85*  CALCIUM 9.5  --  9.0  GFRNONAA 22*  --  25*  ANIONGAP 18*  --  15    Recent Labs  Lab 05/28/20 2125  PROT 7.1  ALBUMIN 3.2*  AST 31  ALT 17  ALKPHOS 88  BILITOT 1.2   Hematology Recent Labs  Lab 05/28/20 1826 05/29/20 0239 05/29/20 0245  WBC 16.9*  --  12.2*  RBC 4.55  --  4.23  HGB 12.7 12.2 11.8*  HCT 40.2 36.0 37.6  MCV 88.4  --  88.9  MCH 27.9  --  27.9  MCHC 31.6  --  31.4  RDW 17.8*  --  17.9*  PLT 225  --  196   BNP Recent Labs  Lab 05/29/20 0245  BNP 562.1*    DDimer  Recent Labs  Lab 05/29/20 0245  DDIMER >20.00*     Radiology/Studies:  DG Chest 2 View  Result Date: 05/28/2020 CLINICAL DATA:  Hypoxia EXAM: CHEST - 2 VIEW COMPARISON:  06/14/2019 FINDINGS: Unchanged markedly widened upper mediastinum consistent with known thoracic aortic aneurysm. Unchanged position of left chest wall pacemaker leads. Remote median sternotomy. There is pulmonary vascular congestion without overt edema. No pleural effusion or pneumothorax. No focal airspace consolidation. IMPRESSION: 1. Pulmonary vascular congestion without overt edema. 2. Unchanged appearance of known thoracic aortic aneurysm. Electronically Signed   By: Ulyses Jarred M.D.   On: 05/28/2020 19:17   CT HEAD  WO CONTRAST  Result Date: 05/28/2020 CLINICAL DATA:  Dementia, tremors, difficulty standing EXAM: CT HEAD WITHOUT CONTRAST TECHNIQUE: Contiguous axial images were obtained from the base of the skull through the vertex without intravenous contrast. COMPARISON:  12/26/2019 FINDINGS: Brain: Stable chronic small-vessel ischemic changes are seen throughout the periventricular white matter and bilateral basal ganglia. No signs of acute infarct or hemorrhage. Lateral ventricles and midline structures are stable. No acute extra-axial fluid collections. No mass effect. Vascular: No hyperdense vessel or unexpected calcification. Skull: Normal. Negative for fracture or focal lesion. Sinuses/Orbits: No acute finding. Other: None. IMPRESSION: 1. Stable chronic small vessel ischemic changes. No acute intracranial process. Electronically Signed   By: Randa Ngo M.D.   On: 05/28/2020 20:25   CT Cervical Spine Wo Contrast  Result Date: 05/28/2020 CLINICAL DATA:  Neck trauma, weakness EXAM: CT CERVICAL SPINE WITHOUT CONTRAST TECHNIQUE: Multidetector CT imaging of the cervical spine was performed without intravenous contrast. Multiplanar CT image reconstructions were also generated. COMPARISON:  12/27/2019 FINDINGS: Alignment: Stable mild anterolisthesis of C3 on C4 and C4 on C5. Otherwise alignment is anatomic. Skull base and vertebrae: No acute fracture. No  primary bone lesion or focal pathologic process. Soft tissues and spinal canal: No prevertebral fluid or swelling. No visible canal hematoma. Disc levels: Mild diffuse facet hypertrophic changes throughout the cervical spine. Prominent cervical spondylosis at C5-6, with mild symmetrical neural foraminal encroachment. Upper chest: Airway is patent. Lung apices are clear. Partial visualization of a known thoracic aortic aneurysm. Other: Reconstructed images demonstrate no additional findings. IMPRESSION: 1. No acute cervical spine fracture. 2. Stable multilevel cervical  facet hypertrophy and spondylosis, most pronounced at C5-6. 3. Stable partial visualization of a known thoracic aortic aneurysm. Electronically Signed   By: Randa Ngo M.D.   On: 05/28/2020 19:51   CT Hip Right Wo Contrast  Result Date: 05/29/2020 CLINICAL DATA:  Right hip pain EXAM: CT OF THE RIGHT HIP WITHOUT CONTRAST TECHNIQUE: Multidetector CT imaging of the right hip was performed according to the standard protocol. Multiplanar CT image reconstructions were also generated. COMPARISON:  None. FINDINGS: Bones/Joint/Cartilage Left total hip arthroplasty has been performed. The pelvis and right hip are intact. No acute fracture or dislocation. There is severe right hip degenerative arthritis with complete loss of the joint space, remodeling of the femoral head and acetabulum, extensive osteophyte formation, and subchondral sclerosis and cyst formation. The osseous structures are diffusely osteopenic. Degenerative changes are noted within the visualized lumbar spine. Ligaments Suboptimally assessed by CT. Muscles and Tendons There is asymmetric atrophy and fatty infiltration of the right gluteal and tensor fascia lata musculature. The ileo psoas musculature is markedly atrophic bilaterally though the iliopsoas tendon appears intact. Gluteal tendon and hamstring tendons appear intact. Soft tissues The visualized bowel is unremarkable. No free fluid within the pelvis. Bladder is unremarkable. Uterus absent. No adnexal masses. Soft tissues of the body wall are unremarkable. IMPRESSION: No acute fracture or dislocation of the right hip. Severe right hip degenerative arthritis with acetabular and femoral head remodeling. Electronically Signed   By: Fidela Salisbury MD   On: 05/29/2020 00:29   NM Pulmonary Perf and Vent  Result Date: 05/29/2020 CLINICAL DATA:  Shortness of breath/hypoxia EXAM: NUCLEAR MEDICINE PERFUSION LUNG SCAN TECHNIQUE: Perfusion images were obtained in multiple projections after intravenous  injection of radiopharmaceutical. Views: Anterior, posterior, left lateral, right lateral, RPO, LPO, RAO, LAO RADIOPHARMACEUTICALS:  4.1 mCi Tc-24m MAA IV COMPARISON:  Chest radiograph May 28, 2020 FINDINGS: Radiotracer uptake is homogeneous and symmetric bilaterally. No perfusion defects are evident. Marked prominence of the thoracic aorta is noted. IMPRESSION: No appreciable perfusion defects. No findings indicative of pulmonary embolus. Dilatation of the thoracic aorta noted on this study, correlating with findings on chest radiograph. Electronically Signed   By: Lowella Grip III M.D.   On: 05/29/2020 13:36   ECHOCARDIOGRAM COMPLETE  Result Date: 05/29/2020    ECHOCARDIOGRAM REPORT   Patient Name:   SHATHA HOOSER Nordlund Date of Exam: 05/29/2020 Medical Rec #:  [id number]   Height:       63.0 in Accession #:    [id number]  Weight:       162.0 lb Date of Birth:  27-Feb-1924   BSA:          1.768 m Patient Age:    68 years    BP:           130/71 mmHg Patient Gender: F           HR:           79 bpm. Exam Location:  Inpatient Procedure: 2D Echo Indications:    CHF-Acute Diastolic H96.22  History:        Patient has prior history of Echocardiogram examinations, most                 recent 06/30/2012. CAD, Prior CABG; Risk Factors:Hypertension and                 Dyslipidemia.                 Aortic Valve: unknown valve is present in the aortic position.                 Procedure Date: 2003.  Sonographer:    Mikki Santee RDCS (AE) Referring Phys: [id number] [place]  1. Left ventricular ejection fraction, by estimation, is 45 to 50%. The left ventricle has mildly decreased function. The left ventricle demonstrates global hypokinesis. Left ventricular diastolic parameters are indeterminate.  2. Right ventricular systolic function is normal. The right ventricular size is normal. There is normal pulmonary artery systolic pressure.  3. The mitral valve is normal in structure. No evidence of mitral valve  regurgitation.  4. The aortic valve has been repaired/replaced. Aortic valve regurgitation is not visualized. There is a unknown valve present in the aortic position. Procedure Date: 2003. Comparison(s): A prior study was performed on 06/28/2012. Stable valve parameters; decreased LVEF. FINDINGS  Left Ventricle: Left ventricular ejection fraction, by estimation, is 45 to 50%. The left ventricle has mildly decreased function. The left ventricle demonstrates global hypokinesis. The left ventricular internal cavity size was normal in size. There is  no left ventricular hypertrophy. Abnormal (paradoxical) septal motion consistent with post-operative status. Left ventricular diastolic parameters are indeterminate. Right Ventricle: The right ventricular size is normal. No increase in right ventricular wall thickness. Right ventricular systolic function is normal. There is normal pulmonary artery systolic pressure. The tricuspid regurgitant velocity is 2.01 m/s, and  with an assumed right atrial pressure of 8 mmHg, the estimated right ventricular systolic pressure is 62.8 mmHg. Left Atrium: Left atrial size was normal in size. Right Atrium: Right atrial size was normal in size. Pericardium: There is no evidence of pericardial effusion. Mitral Valve: The mitral valve is normal in structure. No evidence of mitral valve regurgitation. Tricuspid Valve: The tricuspid valve is grossly normal. Tricuspid valve regurgitation is not demonstrated. No evidence of tricuspid stenosis. Aortic Valve: DVI 0.36. The aortic valve has been repaired/replaced. Aortic valve regurgitation is not visualized. Aortic valve mean gradient measures 7.5 mmHg. Aortic valve peak gradient measures 14.4 mmHg. Aortic valve area, by VTI measures 1.14 cm. There is a unknown valve present in the aortic position. Procedure Date: 2003. Pulmonic Valve: The pulmonic valve was not well visualized. Pulmonic valve regurgitation is not visualized. No evidence of  pulmonic stenosis. Aorta: The aortic root is normal in size and structure. IAS/Shunts: The atrial septum is grossly normal.  LEFT VENTRICLE PLAX 2D LVIDd:         4.40 cm  Diastology LVIDs:         3.70 cm  LV e' medial:    7.83 cm/s LV PW:         1.00 cm  LV E/e' medial:  11.6 LV IVS:        1.00 cm  LV e' lateral:   11.10 cm/s LVOT diam:     2.00 cm  LV E/e' lateral: 8.2 LV SV:         35 LV SV Index:   20 LVOT Area:     3.14 cm  RIGHT VENTRICLE TAPSE (M-mode): 1.0 cm LEFT ATRIUM             Index       RIGHT ATRIUM           Index LA diam:        4.10 cm 2.32 cm/m  RA Area:     11.50 cm LA Vol (A2C):   36.1 ml 20.42 ml/m RA Volume:   24.60 ml  13.91 ml/m LA Vol (A4C):   40.1 ml 22.68 ml/m LA Biplane Vol: 41.0 ml 23.19 ml/m  AORTIC VALVE AV Area (Vmax):    1.10 cm AV Area (Vmean):   1.07 cm AV Area (VTI):     1.14 cm AV Vmax:           190.00 cm/s AV Vmean:          124.500 cm/s AV VTI:            0.312 m AV Peak Grad:      14.4 mmHg AV Mean Grad:      7.5 mmHg LVOT Vmax:         66.80 cm/s LVOT Vmean:        42.400 cm/s LVOT VTI:          0.113 m LVOT/AV VTI ratio: 0.36  AORTA Ao Root diam: 2.10 cm MITRAL VALVE                TRICUSPID VALVE MV Area (PHT): 3.54 cm     TR Peak grad:   16.2 mmHg MV Decel Time: 214 msec     TR Vmax:        201.00 cm/s MV E velocity: 90.70 cm/s MV A velocity: 114.00 cm/s  SHUNTS MV E/A ratio:  0.80         Systemic VTI:  0.11 m                             Systemic Diam: 2.00 cm Rudean Haskell MD Electronically signed by Rudean Haskell MD Signature Date/Time: 05/29/2020/12:47:47 PM    Final    DG HIP UNILAT WITH PELVIS 2-3 VIEWS LEFT  Result Date: 05/28/2020 CLINICAL DATA:  Difficulty standing. EXAM: DG HIP (WITH OR WITHOUT PELVIS) 2-3V LEFT COMPARISON:  Radiograph 12/11/2019 FINDINGS: Left hip arthroplasty in expected alignment. No periprosthetic lucency or fracture. Bones are diffusely under mineralized. The pubic rami are intact. Right hip better assessed  on concurrent right hip exam, reported separately. There are vascular calcifications. IMPRESSION: Left hip arthroplasty in expected alignment without complication. Electronically Signed   By: Keith Rake M.D.   On: 05/28/2020 20:46   DG HIP UNILAT WITH PELVIS 2-3 VIEWS RIGHT  Result Date: 05/28/2020 CLINICAL DATA:  Weakness and difficulty standing. EXAM: DG HIP (WITH OR WITHOUT PELVIS) 2-3V RIGHT COMPARISON:  12/10/2019 FINDINGS: Advanced right hip degenerative change. Complete right hip joint space loss with flattening and remodeling of the femoral head, subchondral cystic change in bony fragmentation. Findings are chronic and not significantly changed. No acute fracture. Pubic rami are intact. IMPRESSION: Advanced right hip degenerative change without acute abnormality. Electronically Signed   By: Keith Rake M.D.   On: 05/28/2020 20:47   VAS [place] LOWER EXTREMITY VENOUS (DVT)  Result Date: 05/29/2020  Lower Venous DVT Study Indications: Swelling. Other Indications: D-Dimer. Risk Factors: None identified. Limitations: Patient positioning. unable to position legs appropriately for exam. Comparison Study: No previous Performing Technologist: Vonzell Schlatter  RVT  Examination Guidelines: A complete evaluation includes B-mode imaging, spectral Doppler, color Doppler, and power Doppler as needed of all accessible portions of each vessel. Bilateral testing is considered an integral part of a complete examination. Limited examinations for reoccurring indications may be performed as noted. The reflux portion of the exam is performed with the patient in reverse Trendelenburg.  +---------+---------------+---------+-----------+----------+--------------+ RIGHT    CompressibilityPhasicitySpontaneityPropertiesThrombus Aging +---------+---------------+---------+-----------+----------+--------------+ CFV      Full           Yes      Yes                                  +---------+---------------+---------+-----------+----------+--------------+ SFJ      Full                                                        +---------+---------------+---------+-----------+----------+--------------+ FV Prox  Full                                                        +---------+---------------+---------+-----------+----------+--------------+ FV Mid   Full                                                        +---------+---------------+---------+-----------+----------+--------------+ FV DistalFull                                                        +---------+---------------+---------+-----------+----------+--------------+ PFV      Full                                                        +---------+---------------+---------+-----------+----------+--------------+ POP      Full           Yes      Yes                                 +---------+---------------+---------+-----------+----------+--------------+ PTV      Full                                                        +---------+---------------+---------+-----------+----------+--------------+ PERO     Full                                                        +---------+---------------+---------+-----------+----------+--------------+   +---------+---------------+---------+-----------+----------+--------------+  LEFT     CompressibilityPhasicitySpontaneityPropertiesThrombus Aging +---------+---------------+---------+-----------+----------+--------------+ CFV      Full           Yes      Yes                                 +---------+---------------+---------+-----------+----------+--------------+ SFJ      Full                                                        +---------+---------------+---------+-----------+----------+--------------+ FV Prox  Full                                                         +---------+---------------+---------+-----------+----------+--------------+ FV Mid   Full                                                        +---------+---------------+---------+-----------+----------+--------------+ FV DistalFull                                                        +---------+---------------+---------+-----------+----------+--------------+ PFV      Full                                                        +---------+---------------+---------+-----------+----------+--------------+ POP      Full           Yes      Yes                                 +---------+---------------+---------+-----------+----------+--------------+ PTV      Full                                                        +---------+---------------+---------+-----------+----------+--------------+ PERO     Full                                                        +---------+---------------+---------+-----------+----------+--------------+  Summary: BILATERAL: - No evidence of deep vein thrombosis seen in the lower extremities, bilaterally. -No evidence of popliteal cyst, bilaterally.   *See table(s) above for measurements and observations.    Preliminary      Assessment and Plan:   # Descending  aorta aneurysm:  CXR just occurred.  On my view [name] aortic aneurysm is significantly enlarged compared with yesterday.  Unclear if this is due to respiratory differences, but it measures larger as well.  Given [name] inability to describe symptoms it is unclear if she has progressive dissection.  Family notes that she appears to be moaning.  It seems that palliative care and hospice may be of benefit.   # Elevated troponin:  # CAD s/p CABG:  # Hyperlipidemia:  Continue pravastatin.  Would not resume home aspirin given what appears to be a worsening descending aortic aneurysm.  No plan for an ischemia work up.  # Acute systolic and diastolic heart failure:  BNP is elevated, LVEF  newly reduced to 45-50% and she has pulmonary edema on CXR.  Will gently diurese.    For questions or updates, please contact Center Please consult [email or web address] for contact info under    Signed, Skeet Latch, MD  05/29/2020 5:30 PM

## 2020-05-29 NOTE — ED Notes (Signed)
Patient transported to Ultrasound 

## 2020-05-29 NOTE — Progress Notes (Addendum)
PT Cancellation Note  Patient Details Name: [name] MRN: [id number] DOB: 10-Aug-1923   Cancelled Treatment:    Reason Eval/Treat Not Completed: Fatigue/lethargy limiting ability to participate;Medical issues which prohibited therapy.  Pt was cleared for DVT, then was restless, now sleeping and lethargic. Per daughter pt was memory care unit [facility] at [facility], was mod I on rollator walker and was not using O2 prev.  Follow along to see as time and pt allow.   Ramond Dial 05/29/2020, 2:42 PM   Mee Hives, PT MS Acute Rehab Dept. Number: [place] and [place]

## 2020-05-30 LAB — URINE CULTURE: Culture: NO GROWTH

## 2020-05-30 LAB — BASIC METABOLIC PANEL
Anion gap: 11 (ref 5–15)
BUN: 32 mg/dL — ABNORMAL HIGH (ref 8–23)
CO2: 24 mmol/L (ref 22–32)
Calcium: 8.6 mg/dL — ABNORMAL LOW (ref 8.9–10.3)
Chloride: 101 mmol/L (ref 98–111)
Creatinine, Ser: 1.67 mg/dL — ABNORMAL HIGH (ref 0.44–1.00)
GFR, Estimated: 28 mL/min — ABNORMAL LOW (ref >60)
Glucose, Bld: 100 mg/dL — ABNORMAL HIGH (ref 70–99)
Potassium: 3.4 mmol/L — ABNORMAL LOW (ref 3.5–5.1)
Sodium: 136 mmol/L (ref 135–145)

## 2020-05-30 LAB — CBC
HCT: 35.7 % — ABNORMAL LOW (ref 36.0–46.0)
Hemoglobin: 11.5 g/dL — ABNORMAL LOW (ref 12.0–15.0)
MCH: 27.8 pg (ref 26.0–34.0)
MCHC: 32.2 g/dL (ref 30.0–36.0)
MCV: 86.4 fL (ref 80.0–100.0)
Platelets: 190 10*3/uL (ref 150–400)
RBC: 4.13 MIL/uL (ref 3.87–5.11)
RDW: 17.3 % — ABNORMAL HIGH (ref 11.5–15.5)
WBC: 10.7 10*3/uL — ABNORMAL HIGH (ref 4.0–10.5)
nRBC: 0 % (ref 0.0–0.2)

## 2020-05-30 LAB — HEPARIN LEVEL (UNFRACTIONATED)
Heparin Unfractionated: 0.16 IU/mL — ABNORMAL LOW (ref 0.30–0.70)
Heparin Unfractionated: 0.51 IU/mL (ref 0.30–0.70)
Heparin Unfractionated: <0.1 IU/mL — ABNORMAL LOW (ref 0.30–0.70)

## 2020-05-30 MED ORDER — HALOPERIDOL LACTATE 5 MG/ML IJ SOLN
1.0000 mg | INTRAMUSCULAR | Status: DC | PRN
  Administered 2020-05-30 – 2020-05-31 (×2): 1 mg via INTRAVENOUS
  Filled 2020-05-30 (×2): qty 1

## 2020-05-30 MED ORDER — MORPHINE SULFATE (CONCENTRATE) 10 MG/0.5ML PO SOLN
5.0000 mg | ORAL | Status: DC | PRN
  Administered 2020-05-30 – 2020-05-31 (×3): 5 mg via ORAL
  Filled 2020-05-30 (×3): qty 0.5

## 2020-05-30 MED ORDER — POTASSIUM CHLORIDE CRYS ER 20 MEQ PO TBCR
40.0000 meq | EXTENDED_RELEASE_TABLET | Freq: Once | ORAL | Status: AC
  Administered 2020-05-30: 40 meq via ORAL
  Filled 2020-05-30: qty 2

## 2020-05-30 MED ORDER — LORAZEPAM 2 MG/ML IJ SOLN
INTRAMUSCULAR | Status: AC
  Administered 2020-05-30: 0.5 mg via INTRAVENOUS
  Filled 2020-05-30: qty 1

## 2020-05-30 MED ORDER — ASPIRIN EC 81 MG PO TBEC
81.0000 mg | DELAYED_RELEASE_TABLET | Freq: Every day | ORAL | Status: DC
  Administered 2020-05-30: 81 mg via ORAL
  Filled 2020-05-30: qty 1

## 2020-05-30 MED ORDER — LORAZEPAM 2 MG/ML IJ SOLN: 0.5000 mg | Freq: Once | INTRAMUSCULAR | Status: AC

## 2020-05-30 MED ORDER — POLYETHYLENE GLYCOL 3350 17 G PO PACK
17.0000 g | PACK | Freq: Every day | ORAL | Status: DC
  Administered 2020-05-30: 17 g via ORAL
  Filled 2020-05-30: qty 1

## 2020-05-30 MED ORDER — RISPERIDONE 0.25 MG PO TABS
0.2500 mg | ORAL_TABLET | Freq: Every day | ORAL | Status: DC
  Administered 2020-05-30: 0.25 mg via ORAL
  Filled 2020-05-30 (×2): qty 1

## 2020-05-30 NOTE — Evaluation (Addendum)
Physical Therapy Evaluation Patient Details Name: [name] MRN: [id number] DOB: [birth year] Today's Date: 05/30/2020   History of Present Illness  [name] is a 87 y.o. female for evaluation of altered mental status and hypoxia.         PMH:  advanced dementia, CKD stage III, paroxysmal A. fib not on anticoagulation, CAD status post CABG, carotid artery stenosis, complete heart block status post PPM 2013, hypertension, hyperlipidemia, thoracic aortic aneurysm status post repair 2013, bioprosthetic AVR in 2003  Clinical Impression  Pt admitted with above diagnosis. Pt was unable to transition to EOB even with total assist with pt resisting movement. Pt with incr confusion and agitation once stimulated with pt pulling off gown at times.  Pt placed back in comfortable position in bed.  Unless A living memory care can provide mod assist, will need SNF to work on balance as per the chart,pt had had a fall at the facility as well.  Pt currently with functional limitations due to the deficits listed below (see PT Problem List). Pt will benefit from skilled PT to increase their independence and safety with mobility to allow discharge to the venue listed below.      Follow Up Recommendations SNF;Supervision/Assistance - 24 hour    Equipment Recommendations  None recommended by PT    Recommendations for Other Services       Precautions / Restrictions Precautions Precautions: Fall Precaution Comments: Per records patient had a recent fall at facility. Restrictions Weight Bearing Restrictions: No      Mobility  Bed Mobility Overal bed mobility: Needs Assistance Bed Mobility: Rolling;Supine to Sit Rolling:  (pt. was reaching and holding onto bed rail to pull herself to the R. Pt. was not following bed mobility commands.)   Supine to sit: Total assist     General bed mobility comments: Attempted to move pt to EOB however pt not able to complete movement even with total assist due to heavy  resistance by pt.    Transfers                 General transfer comment: unable to attempts secondary to agitation  Ambulation/Gait                Stairs            Wheelchair Mobility    Modified Rankin (Stroke Patients Only)       Balance                                             Pertinent Vitals/Pain Pain Assessment: No/denies pain Pain Score: 0-No pain    Home Living Family/patient expects to be discharged to:: Assisted living                 Additional Comments: Pt in memory care at [facility] living per chart    Prior Function Level of Independence: Needs assistance   Gait / Transfers Assistance Needed: per chart she was amb with walker  ADL's / Homemaking Assistance Needed: Pt. has advanced dementia and is not able to provide information        Hand Dominance        Extremity/Trunk Assessment   Upper Extremity Assessment Upper Extremity Assessment: Defer to OT evaluation    Lower Extremity Assessment Lower Extremity Assessment: Difficult to assess due to impaired cognition (moving LEs  and rolling some in bed)       Communication   Communication: Expressive difficulties  Cognition Arousal/Alertness: Awake/alert Behavior During Therapy: Agitated Overall Cognitive Status: History of cognitive impairments - at baseline                                 General Comments: Pt. was only answering yes to all questions asked.      General Comments      Exercises     Assessment/Plan    PT Assessment Patient needs continued PT services  PT Problem List Decreased activity tolerance;Decreased balance;Decreased mobility;Decreased knowledge of use of DME;Decreased knowledge of precautions;Decreased safety awareness;Cardiopulmonary status limiting activity       PT Treatment Interventions DME instruction;Functional mobility training;Therapeutic activities;Therapeutic exercise;Balance  training;Patient/family education    PT Goals (Current goals can be found in the Care Plan section)  Acute Rehab PT Goals Patient Stated Goal: none stated PT Goal Formulation: With patient Time For Goal Achievement: 06/13/20 Potential to Achieve Goals: Good    Frequency Min 2X/week   Barriers to discharge Decreased caregiver support      Co-evaluation               AM-PAC PT "6 Clicks" Mobility  Outcome Measure Help needed turning from your back to your side while in a flat bed without using bedrails?: A Lot Help needed moving from lying on your back to sitting on the side of a flat bed without using bedrails?: Total Help needed moving to and from a bed to a chair (including a wheelchair)?: Total Help needed standing up from a chair using your arms (e.g., wheelchair or bedside chair)?: Total Help needed to walk in hospital room?: Total Help needed climbing 3-5 steps with a railing? : Total 6 Click Score: 7    End of Session Equipment Utilized During Treatment: Gait belt Activity Tolerance: Patient limited by fatigue;Treatment limited secondary to agitation Patient left: in bed;with call bell/phone within reach;with bed alarm set Nurse Communication: Mobility status;Need for lift equipment PT Visit Diagnosis: Muscle weakness (generalized) (M62.81)    Time: 9417-4081 PT Time Calculation (min) (ACUTE ONLY): 16 min   Charges:   PT Evaluation $PT Eval Moderate Complexity: 1 Mod          Gaige Fussner M,PT Acute Rehab Services [phone] (pager)  Denice Paradise 05/30/2020, 1:21 PM

## 2020-05-30 NOTE — TOC Initial Note (Signed)
Transition of Care [facility]) - Initial/Assessment Note    Patient Details  Name: [name] MRN: [id number] Date of Birth: 12/16/1923  [facility] CM/SW Contact:    Joanne Chars, LCSW Phone Number: 05/30/2020, 3:30 PM  Clinical Narrative:   CSW met with pt [name] in pt room.  Pt unable to participate in conversation.  Pt resident at [facility].  Son has met with palliative care, made decision to involve hospice.  Choice document given, he would like [facility].  They are interested in potential residential hospice if pt is eligible.  CSW spoke with Chrislyn at [facility] and made referral. CSW LM with Donnetta Simpers at [facility], [phone] to confirm that pt can return to that facility if needed.                  Expected Discharge Plan: Home w Hospice Care Barriers to Discharge: Continued Medical Work up   Patient Goals and CMS Choice   CMS Medicare.gov Compare Post Acute Care list provided to:: Patient Represenative (must comment) Choice offered to / list presented to : Adult Children  Expected Discharge Plan and Services Expected Discharge Plan: Home w Hospice Care In-house Referral: Clinical Social Work   Post Acute Care Choice: Hospice Living arrangements for the past 2 months: [place] ([place])                                      Prior Living Arrangements/Services Living arrangements for the past 2 months: [place] ([place]) Lives with:: Facility Resident Patient language and need for interpreter reviewed:: No        Need for Family Participation in Patient Care: Yes (Comment) Care giver support system in place?: Yes (comment) Current home services: Other (comment) (na) Criminal Activity/Legal Involvement Pertinent to Current Situation/Hospitalization: No - Comment as needed  Activities of Daily Living      Permission Sought/Granted                   Emotional Assessment Appearance:: Appears stated age Attitude/Demeanor/Rapport: Unable to Assess Affect (typically observed): Unable to Assess Orientation: : Oriented to Self Alcohol / Substance Use: Not Applicable Psych Involvement: No (comment)  Admission diagnosis:  Weakness [R53.1] Fall [W19.XXXA] Acute hypoxemic respiratory failure ([place]) [J96.01] Patient Active Problem List   Diagnosis Date Noted  . Acute metabolic encephalopathy 81/82/9937  . Generalized weakness 05/29/2020  . Acute-on-chronic kidney injury ([place]) 05/29/2020  . Fall 05/29/2020  . Shortness of breath   . Thoracic aortic aneurysm without rupture ([place])   . Acute combined systolic and diastolic heart failure ([place])   . Acute hypoxemic respiratory failure ([place]) 05/28/2020  . Hematuria 09/20/2018  . Bilateral leg edema 09/20/2018  . Prediabetes 09/09/2016  . CKD (chronic kidney disease) stage 3, GFR 30-59 ml/min (HCC) 09/10/2015  . Arthritis of right hip, severe 09/10/2015  . Pacemaker-St.Jude 01/13/2012  . Second degree Mobitz II AV block 01/11/2012  . CAROTID STENOSIS 11/29/2008  . Hearing loss 10/12/2008  . Osteoporosis 05/08/2008  . AORTIC VALVE REPLACEMENT, HX OF 05/08/2008  . Hyperlipidemia 04/13/2007  . Mild dementia ([place]) 04/13/2007  . Coronary atherosclerosis 04/13/2007  . Paroxysmal atrial fibrillation ([place]) 04/13/2007  . DIVERTICULOSIS OF COLON 04/13/2007  . Anxiety state 01/18/2007  . Essential hypertension 01/18/2007   PCP:  Binnie Rail, MD Pharmacy:   [id number] [place], [place] - [address] [place] [address] Phone: [phone] Fax: [phone]  [id number] - [place], [place] - [address]  [place] [address] Phone: [phone] Fax: [phone]     Social Determinants of Health (SDOH) Interventions    Readmission Risk Interventions No flowsheet  data found.  

## 2020-05-30 NOTE — Progress Notes (Signed)
PROGRESS NOTE    [name]  KDX:[id number] DOB: 10-22-1923 DOA: 05/28/2020 PCP: Binnie Rail, MD     Brief Narrative:  [name] a 87 y.o.femalewith medical history significant ofadvanced dementia, CKD stage III,paroxysmalA. fib not on anticoagulation, CAD status post CABG, carotid artery stenosis, complete heart block status post PPM 2013, hypertension, hyperlipidemia, thoracic aortic aneurysm status post repair 2013, bioprosthetic AVR in 2003presenting to the ED via EMS for evaluation of altered mental status. She was satting 88% on room air with EMS, placed on 2 L supplemental oxygen and sats improved to 96%.No history could be obtained from the patient given her advanced dementia. Daughter-in-law at bedside states patient resides at the memory care unit at [facility]. States she has advanced dementia and is disoriented at baseline. She usually responds to questions only by saying yes or no. She is normally able to ambulate with a walker but today staff at her nursing facility noticed that she was very lethargic and not able to walk. They noticed that she was having difficulty breathing, appeared pale, and her lips were purple.  Patient was admitted with working diagnosis of acute hypoxemic respiratory failure.  VTE was ruled out.  Patient had elevated troponin and cardiology was consulted.  New events last 24 hours / Subjective: Per nursing report, patient has intermittently been agitated, restless, moaning.  She received IV fentanyl this morning prior to my evaluation.  She appears to be much more comfortable.  She is alert, answers yes to every question asked.  Assessment & Plan:   Principal Problem:   Acute hypoxemic respiratory failure (HCC) Active Problems:   Acute metabolic encephalopathy   Generalized weakness   Acute-on-chronic kidney injury ([place])   Fall   Shortness of breath   Thoracic aortic aneurysm without rupture (HCC)   Acute combined  systolic and diastolic heart failure (HCC)    Acute hypoxemic respiratory failure -In the emergency department, oxygen saturation was 88% on room air -DVT ultrasound and V/Q scan negative for VTE -On room air this morning  Acute systolic and diastolic heart failure -Cardiology following -Continue IV Lasix  Elevated troponin -Echocardiogram reveals EF 45 to 50%, LV global hypokinesis -Troponin 290 --> 243  -Cardiology following  -IV heparin for 48 hours -Continue aspirin -Cardiology without any plans for ischemic work-up at this time  Descending aorta aneurysm -Stable currently  Leukocytosis -Without evidence of acute infection -UA negative, urine culture negative -CXR showed pulmonary vascular congestion without overt edema or airspace consolidation -Blood cultures pending -Improving   AKI on CKD stage IIIb -Baseline creatinine 1.6 a year ago -Resolved and back to baseline 1.67 today   Paroxysmal atrial fibrillation -Not on chronic anticoagulation due to advanced dementia, high fall risk  Complete heart block -Status post PPM  Advanced dementia -At baseline, patient is able to say yes/no and recognize family members, ambulate with assistance  Generalized weakness and falls -PT OT  Hypokalemia -Replace, trend    DVT prophylaxis: IV heparin for 48 hours   Code Status: DNR Family Communication: Updated son and daughter-in-law over the phone and my recommendation for palliative care and likely hospice Disposition Plan:  Status is: Inpatient  Remains inpatient appropriate because:IV treatments appropriate due to intensity of illness or inability to take PO   Dispo: The patient is from: Memory care unit              Anticipated d/c is to: TBD  Patient currently is not medically stable to d/c.  Remains on IV heparin.  Palliative care medicine consulted   Difficult to place patient No      Consultants:   Cardiology  Palliative  care medicine   Antimicrobials:  Anti-infectives (From admission, onward)   Start     Dose/Rate Route Frequency Ordered Stop   05/28/20 2315  cefTRIAXone (ROCEPHIN) 1 g in sodium chloride 0.9 % 100 mL IVPB        1 g 200 mL/hr over 30 Minutes Intravenous  Once 05/28/20 2307 05/29/20 0010        Objective: Vitals:   05/29/20 1400 05/29/20 2120 05/30/20 0300 05/30/20 0600  BP: 125/78 122/83 97/78   Pulse:  81 81   Resp:  20 18   Temp:  98.4 F (36.9 C) 97.8 F (36.6 C)   TempSrc:  Oral Oral   SpO2:  97% 97%   Weight:    69.5 kg  Height:        Intake/Output Summary (Last 24 hours) at 05/30/2020 1130 Last data filed at 05/30/2020 1007 Gross per 24 hour  Intake 790.19 ml  Output 400 ml  Net 390.19 ml   Filed Weights   05/29/20 0530 05/30/20 0600  Weight: 73.5 kg 69.5 kg    Examination:  General exam: Appears calm and comfortable   Respiratory system: Clear to auscultation. Respiratory effort normal. No respiratory distress. No conversational dyspnea.  Cardiovascular system: S1 & S2 heard, RRR. No murmurs. No pedal edema. Gastrointestinal system: Abdomen is nondistended, soft and nontender. Normal bowel sounds heard. Central nervous system: Alert  Extremities: Symmetric in appearance  Skin: + Some bruising and swelling over the right forearm at site of IV infiltration Psychiatry: Judgement and insight appear poor, advanced dementia    Data Reviewed: I have personally reviewed following labs and imaging studies  CBC: Recent Labs  Lab 05/28/20 1826 05/29/20 0239 05/29/20 0245 05/30/20 0653  WBC 16.9*  --  12.2* 10.7*  HGB 12.7 12.2 11.8* 11.5*  HCT 40.2 36.0 37.6 35.7*  MCV 88.4  --  88.9 86.4  PLT 225  --  196 355   Basic Metabolic Panel: Recent Labs  Lab 05/28/20 1826 05/29/20 0239 05/29/20 0245 05/30/20 0653  NA 137 138 138 136  K 4.3 4.0 3.9 3.4*  CL 101  --  102 101  CO2 18*  --  21* 24  GLUCOSE 165*  --  149* 100*  BUN 29*  --  31* 32*   CREATININE 2.06*  --  1.85* 1.67*  CALCIUM 9.5  --  9.0 8.6*   GFR: Estimated Creatinine Clearance: 19.3 mL/min (A) (by C-G formula based on SCr of 1.67 mg/dL (H)). Liver Function Tests: Recent Labs  Lab 05/28/20 2125  AST 31  ALT 17  ALKPHOS 88  BILITOT 1.2  PROT 7.1  ALBUMIN 3.2*   No results for input(s): LIPASE, AMYLASE in the last 168 hours. Recent Labs  Lab 05/29/20 0245  AMMONIA 15   Coagulation Profile: No results for input(s): INR, PROTIME in the last 168 hours. Cardiac Enzymes: No results for input(s): CKTOTAL, CKMB, CKMBINDEX, TROPONINI in the last 168 hours. BNP (last 3 results) No results for input(s): PROBNP in the last 8760 hours. HbA1C: No results for input(s): HGBA1C in the last 72 hours. CBG: Recent Labs  Lab 05/28/20 2003  GLUCAP 164*   Lipid Profile: No results for input(s): CHOL, HDL, LDLCALC, TRIG, CHOLHDL, LDLDIRECT in the last 72 hours. Thyroid  Function Tests: Recent Labs    05/29/20 0245  TSH 2.103   Anemia Panel: Recent Labs    05/29/20 0245  VITAMINB12 168*   Sepsis Labs: Recent Labs  Lab 05/28/20 2125 05/29/20 0245  PROCALCITON  --  1.14  LATICACIDVEN 1.9 1.3    Recent Results (from the past 240 hour(s))  Culture, blood (routine x 2)     Status: None (Preliminary result)   Collection Time: 05/28/20  9:03 PM   Specimen: BLOOD  Result Value Ref Range Status   Specimen Description BLOOD SITE NOT SPECIFIED  Final   Special Requests   Final    BOTTLES DRAWN AEROBIC AND ANAEROBIC Blood Culture adequate volume   Culture   Final    NO GROWTH < 24 HOURS Performed at [facility], [place] [address] [place], [place] [address]    Report Status PENDING  Incomplete  Culture, blood (routine x 2)     Status: None (Preliminary result)   Collection Time: 05/28/20  9:14 PM   Specimen: BLOOD  Result Value Ref Range Status   Specimen Description BLOOD SITE NOT SPECIFIED  Final   Special Requests   Final    BOTTLES DRAWN  AEROBIC AND ANAEROBIC Blood Culture adequate volume   Culture   Final    NO GROWTH < 24 HOURS Performed at [facility], [place] [address] [place], [place] [address]    Report Status PENDING  Incomplete  Resp Panel by RT-PCR (Flu A&B, Covid) Nasopharyngeal Swab     Status: None   Collection Time: 05/28/20 11:35 PM   Specimen: Nasopharyngeal Swab; Nasopharyngeal(NP) swabs in vial transport medium  Result Value Ref Range Status   SARS Coronavirus 2 by RT PCR NEGATIVE NEGATIVE Final    Comment: (NOTE) SARS-CoV-2 target nucleic acids are NOT DETECTED.  The SARS-CoV-2 RNA is generally detectable in upper respiratory specimens during the acute phase of infection. The lowest concentration of SARS-CoV-2 viral copies this assay can detect is 138 copies/mL. A negative result does not preclude SARS-Cov-2 infection and should not be used as the sole basis for treatment or other patient management decisions. A negative result may occur with  improper specimen collection/handling, submission of specimen other than nasopharyngeal swab, presence of viral mutation(s) within the areas targeted by this assay, and inadequate number of viral copies(<138 copies/mL). A negative result must be combined with clinical observations, patient history, and epidemiological information. The expected result is Negative.  Fact Sheet for Patients:  EntrepreneurPulse.com.au  Fact Sheet for Healthcare Providers:  IncredibleEmployment.be  This test is no t yet approved or cleared by the [place] FDA and  has been authorized for detection and/or diagnosis of SARS-CoV-2 by FDA under an Emergency Use Authorization (EUA). This EUA will remain  in effect (meaning this test can be used) for the duration of the COVID-19 declaration under Section 564(b)(1) of the Act, 21 U.S.C.section 360bbb-3(b)(1), unless the authorization is terminated  or revoked sooner.       Influenza A  by PCR NEGATIVE NEGATIVE Final   Influenza B by PCR NEGATIVE NEGATIVE Final    Comment: (NOTE) The Xpert Xpress SARS-CoV-2/FLU/RSV plus assay is intended as an aid in the diagnosis of influenza from Nasopharyngeal swab specimens and should not be used as a sole basis for treatment. Nasal washings and aspirates are unacceptable for Xpert Xpress SARS-CoV-2/FLU/RSV testing.  Fact Sheet for Patients: EntrepreneurPulse.com.au  Fact Sheet for Healthcare Providers: IncredibleEmployment.be  This test is not yet approved  or cleared by the [place] and has been authorized for detection and/or diagnosis of SARS-CoV-2 by FDA under an Emergency Use Authorization (EUA). This EUA will remain in effect (meaning this test can be used) for the duration of the COVID-19 declaration under Section 564(b)(1) of the Act, 21 U.S.C. section 360bbb-3(b)(1), unless the authorization is terminated or revoked.  Performed at [facility], [place] [address] [place], [place] [address]   Urine culture     Status: None   Collection Time: 05/29/20  4:48 AM   Specimen: Urine, Catheterized  Result Value Ref Range Status   Specimen Description URINE, CATHETERIZED  Final   Special Requests NONE  Final   Culture   Final    NO GROWTH Performed at [facility] [address] [place], [place] [address]    Report Status 05/30/2020 FINAL  Final      Radiology Studies: DG Chest 2 View  Result Date: 05/28/2020 CLINICAL DATA:  Hypoxia EXAM: CHEST - 2 VIEW COMPARISON:  06/14/2019 FINDINGS: Unchanged markedly widened upper mediastinum consistent with known thoracic aortic aneurysm. Unchanged position of left chest wall pacemaker leads. Remote median sternotomy. There is pulmonary vascular congestion without overt edema. No pleural effusion or pneumothorax. No focal airspace consolidation. IMPRESSION: 1. Pulmonary vascular congestion without overt edema. 2. Unchanged  appearance of known thoracic aortic aneurysm. Electronically Signed   By: Ulyses Jarred M.D.   On: 05/28/2020 19:17   CT HEAD WO CONTRAST  Result Date: 05/28/2020 CLINICAL DATA:  Dementia, tremors, difficulty standing EXAM: CT HEAD WITHOUT CONTRAST TECHNIQUE: Contiguous axial images were obtained from the base of the skull through the vertex without intravenous contrast. COMPARISON:  12/26/2019 FINDINGS: Brain: Stable chronic small-vessel ischemic changes are seen throughout the periventricular white matter and bilateral basal ganglia. No signs of acute infarct or hemorrhage. Lateral ventricles and midline structures are stable. No acute extra-axial fluid collections. No mass effect. Vascular: No hyperdense vessel or unexpected calcification. Skull: Normal. Negative for fracture or focal lesion. Sinuses/Orbits: No acute finding. Other: None. IMPRESSION: 1. Stable chronic small vessel ischemic changes. No acute intracranial process. Electronically Signed   By: Randa Ngo M.D.   On: 05/28/2020 20:25   CT Cervical Spine Wo Contrast  Result Date: 05/28/2020 CLINICAL DATA:  Neck trauma, weakness EXAM: CT CERVICAL SPINE WITHOUT CONTRAST TECHNIQUE: Multidetector CT imaging of the cervical spine was performed without intravenous contrast. Multiplanar CT image reconstructions were also generated. COMPARISON:  12/27/2019 FINDINGS: Alignment: Stable mild anterolisthesis of C3 on C4 and C4 on C5. Otherwise alignment is anatomic. Skull base and vertebrae: No acute fracture. No primary bone lesion or focal pathologic process. Soft tissues and spinal canal: No prevertebral fluid or swelling. No visible canal hematoma. Disc levels: Mild diffuse facet hypertrophic changes throughout the cervical spine. Prominent cervical spondylosis at C5-6, with mild symmetrical neural foraminal encroachment. Upper chest: Airway is patent. Lung apices are clear. Partial visualization of a known thoracic aortic aneurysm. Other:  Reconstructed images demonstrate no additional findings. IMPRESSION: 1. No acute cervical spine fracture. 2. Stable multilevel cervical facet hypertrophy and spondylosis, most pronounced at C5-6. 3. Stable partial visualization of a known thoracic aortic aneurysm. Electronically Signed   By: Randa Ngo M.D.   On: 05/28/2020 19:51   CT Hip Right Wo Contrast  Result Date: 05/29/2020 CLINICAL DATA:  Right hip pain EXAM: CT OF THE RIGHT HIP WITHOUT CONTRAST TECHNIQUE: Multidetector CT imaging of the right hip was performed according to the  standard protocol. Multiplanar CT image reconstructions were also generated. COMPARISON:  None. FINDINGS: Bones/Joint/Cartilage Left total hip arthroplasty has been performed. The pelvis and right hip are intact. No acute fracture or dislocation. There is severe right hip degenerative arthritis with complete loss of the joint space, remodeling of the femoral head and acetabulum, extensive osteophyte formation, and subchondral sclerosis and cyst formation. The osseous structures are diffusely osteopenic. Degenerative changes are noted within the visualized lumbar spine. Ligaments Suboptimally assessed by CT. Muscles and Tendons There is asymmetric atrophy and fatty infiltration of the right gluteal and tensor fascia lata musculature. The ileo psoas musculature is markedly atrophic bilaterally though the iliopsoas tendon appears intact. Gluteal tendon and hamstring tendons appear intact. Soft tissues The visualized bowel is unremarkable. No free fluid within the pelvis. Bladder is unremarkable. Uterus absent. No adnexal masses. Soft tissues of the body wall are unremarkable. IMPRESSION: No acute fracture or dislocation of the right hip. Severe right hip degenerative arthritis with acetabular and femoral head remodeling. Electronically Signed   By: Fidela Salisbury MD   On: 05/29/2020 00:29   NM Pulmonary Perf and Vent  Result Date: 05/29/2020 CLINICAL DATA:  Shortness of  breath/hypoxia EXAM: NUCLEAR MEDICINE PERFUSION LUNG SCAN TECHNIQUE: Perfusion images were obtained in multiple projections after intravenous injection of radiopharmaceutical. Views: Anterior, posterior, left lateral, right lateral, RPO, LPO, RAO, LAO RADIOPHARMACEUTICALS:  4.1 mCi Tc-25m MAA IV COMPARISON:  Chest radiograph May 28, 2020 FINDINGS: Radiotracer uptake is homogeneous and symmetric bilaterally. No perfusion defects are evident. Marked prominence of the thoracic aorta is noted. IMPRESSION: No appreciable perfusion defects. No findings indicative of pulmonary embolus. Dilatation of the thoracic aorta noted on this study, correlating with findings on chest radiograph. Electronically Signed   By: Lowella Grip III M.D.   On: 05/29/2020 13:36   DG CHEST PORT 1 VIEW  Result Date: 05/29/2020 CLINICAL DATA:  Weakness and shortness of breath EXAM: PORTABLE CHEST 1 VIEW COMPARISON:  05/28/2020 FINDINGS: Cardiac shadow remains enlarged. Pacing device and postsurgical changes are again seen. Tortuous thoracic aorta with aneurysmal dilatation is again noted similar to that seen on prior exams. No focal infiltrate or sizable effusion is seen. No bony abnormality is noted. IMPRESSION: Chronic dilatation of the ascending aorta. No other focal abnormality is noted. Electronically Signed   By: Inez Catalina M.D.   On: 05/29/2020 19:18   ECHOCARDIOGRAM COMPLETE  Result Date: 05/29/2020    ECHOCARDIOGRAM REPORT   Patient Name:   [name] Date of Exam: 05/29/2020 Medical Rec #:  [id number]   Height:       63.0 in Accession #:    [id number]  Weight:       162.0 lb Date of Birth:  03/18/1924   BSA:          1.768 m Patient Age:    55 years    BP:           130/71 mmHg Patient Gender: F           HR:           79 bpm. Exam Location:  Inpatient Procedure: 2D Echo Indications:    CHF-Acute Diastolic O35.00  History:        Patient has prior history of Echocardiogram examinations, most                 recent 06/30/2012.  CAD, Prior CABG; Risk Factors:Hypertension and  Dyslipidemia.                 Aortic Valve: unknown valve is present in the aortic position.                 Procedure Date: 2003.  Sonographer:    Mikki Santee RDCS (AE) Referring Phys: [id number] [place]  1. Left ventricular ejection fraction, by estimation, is 45 to 50%. The left ventricle has mildly decreased function. The left ventricle demonstrates global hypokinesis. Left ventricular diastolic parameters are indeterminate.  2. Right ventricular systolic function is normal. The right ventricular size is normal. There is normal pulmonary artery systolic pressure.  3. The mitral valve is normal in structure. No evidence of mitral valve regurgitation.  4. The aortic valve has been repaired/replaced. Aortic valve regurgitation is not visualized. There is a unknown valve present in the aortic position. Procedure Date: 2003. Comparison(s): A prior study was performed on 06/28/2012. Stable valve parameters; decreased LVEF. FINDINGS  Left Ventricle: Left ventricular ejection fraction, by estimation, is 45 to 50%. The left ventricle has mildly decreased function. The left ventricle demonstrates global hypokinesis. The left ventricular internal cavity size was normal in size. There is  no left ventricular hypertrophy. Abnormal (paradoxical) septal motion consistent with post-operative status. Left ventricular diastolic parameters are indeterminate. Right Ventricle: The right ventricular size is normal. No increase in right ventricular wall thickness. Right ventricular systolic function is normal. There is normal pulmonary artery systolic pressure. The tricuspid regurgitant velocity is 2.01 m/s, and  with an assumed right atrial pressure of 8 mmHg, the estimated right ventricular systolic pressure is 81.1 mmHg. Left Atrium: Left atrial size was normal in size. Right Atrium: Right atrial size was normal in size. Pericardium: There is no  evidence of pericardial effusion. Mitral Valve: The mitral valve is normal in structure. No evidence of mitral valve regurgitation. Tricuspid Valve: The tricuspid valve is grossly normal. Tricuspid valve regurgitation is not demonstrated. No evidence of tricuspid stenosis. Aortic Valve: DVI 0.36. The aortic valve has been repaired/replaced. Aortic valve regurgitation is not visualized. Aortic valve mean gradient measures 7.5 mmHg. Aortic valve peak gradient measures 14.4 mmHg. Aortic valve area, by VTI measures 1.14 cm. There is a unknown valve present in the aortic position. Procedure Date: 2003. Pulmonic Valve: The pulmonic valve was not well visualized. Pulmonic valve regurgitation is not visualized. No evidence of pulmonic stenosis. Aorta: The aortic root is normal in size and structure. IAS/Shunts: The atrial septum is grossly normal.  LEFT VENTRICLE PLAX 2D LVIDd:         4.40 cm  Diastology LVIDs:         3.70 cm  LV e' medial:    7.83 cm/s LV PW:         1.00 cm  LV E/e' medial:  11.6 LV IVS:        1.00 cm  LV e' lateral:   11.10 cm/s LVOT diam:     2.00 cm  LV E/e' lateral: 8.2 LV SV:         35 LV SV Index:   20 LVOT Area:     3.14 cm  RIGHT VENTRICLE TAPSE (M-mode): 1.0 cm LEFT ATRIUM             Index       RIGHT ATRIUM           Index LA diam:        4.10 cm 2.32 cm/m  RA Area:  11.50 cm LA Vol (A2C):   36.1 ml 20.42 ml/m RA Volume:   24.60 ml  13.91 ml/m LA Vol (A4C):   40.1 ml 22.68 ml/m LA Biplane Vol: 41.0 ml 23.19 ml/m  AORTIC VALVE AV Area (Vmax):    1.10 cm AV Area (Vmean):   1.07 cm AV Area (VTI):     1.14 cm AV Vmax:           190.00 cm/s AV Vmean:          124.500 cm/s AV VTI:            0.312 m AV Peak Grad:      14.4 mmHg AV Mean Grad:      7.5 mmHg LVOT Vmax:         66.80 cm/s LVOT Vmean:        42.400 cm/s LVOT VTI:          0.113 m LVOT/AV VTI ratio: 0.36  AORTA Ao Root diam: 2.10 cm MITRAL VALVE                TRICUSPID VALVE MV Area (PHT): 3.54 cm     TR Peak grad:    16.2 mmHg MV Decel Time: 214 msec     TR Vmax:        201.00 cm/s MV E velocity: 90.70 cm/s MV A velocity: 114.00 cm/s  SHUNTS MV E/A ratio:  0.80         Systemic VTI:  0.11 m                             Systemic Diam: 2.00 cm Rudean Haskell MD Electronically signed by Rudean Haskell MD Signature Date/Time: 05/29/2020/12:47:47 PM    Final    DG HIP UNILAT WITH PELVIS 2-3 VIEWS LEFT  Result Date: 05/28/2020 CLINICAL DATA:  Difficulty standing. EXAM: DG HIP (WITH OR WITHOUT PELVIS) 2-3V LEFT COMPARISON:  Radiograph 12/11/2019 FINDINGS: Left hip arthroplasty in expected alignment. No periprosthetic lucency or fracture. Bones are diffusely under mineralized. The pubic rami are intact. Right hip better assessed on concurrent right hip exam, reported separately. There are vascular calcifications. IMPRESSION: Left hip arthroplasty in expected alignment without complication. Electronically Signed   By: Keith Rake M.D.   On: 05/28/2020 20:46   DG HIP UNILAT WITH PELVIS 2-3 VIEWS RIGHT  Result Date: 05/28/2020 CLINICAL DATA:  Weakness and difficulty standing. EXAM: DG HIP (WITH OR WITHOUT PELVIS) 2-3V RIGHT COMPARISON:  12/10/2019 FINDINGS: Advanced right hip degenerative change. Complete right hip joint space loss with flattening and remodeling of the femoral head, subchondral cystic change in bony fragmentation. Findings are chronic and not significantly changed. No acute fracture. Pubic rami are intact. IMPRESSION: Advanced right hip degenerative change without acute abnormality. Electronically Signed   By: Keith Rake M.D.   On: 05/28/2020 20:47   VAS [place] LOWER EXTREMITY VENOUS (DVT)  Result Date: 05/29/2020  Lower Venous DVT Study Indications: Swelling. Other Indications: D-Dimer. Risk Factors: None identified. Limitations: Patient positioning. unable to position legs appropriately for exam. Comparison Study: No previous Performing Technologist: Vonzell Schlatter RVT  Examination Guidelines: A  complete evaluation includes B-mode imaging, spectral Doppler, color Doppler, and power Doppler as needed of all accessible portions of each vessel. Bilateral testing is considered an integral part of a complete examination. Limited examinations for reoccurring indications may be performed as noted. The reflux portion of the exam is performed with the patient in  reverse Trendelenburg.  +---------+---------------+---------+-----------+----------+--------------+ RIGHT    CompressibilityPhasicitySpontaneityPropertiesThrombus Aging +---------+---------------+---------+-----------+----------+--------------+ CFV      Full           Yes      Yes                                 +---------+---------------+---------+-----------+----------+--------------+ SFJ      Full                                                        +---------+---------------+---------+-----------+----------+--------------+ FV Prox  Full                                                        +---------+---------------+---------+-----------+----------+--------------+ FV Mid   Full                                                        +---------+---------------+---------+-----------+----------+--------------+ FV DistalFull                                                        +---------+---------------+---------+-----------+----------+--------------+ PFV      Full                                                        +---------+---------------+---------+-----------+----------+--------------+ POP      Full           Yes      Yes                                 +---------+---------------+---------+-----------+----------+--------------+ PTV      Full                                                        +---------+---------------+---------+-----------+----------+--------------+ PERO     Full                                                         +---------+---------------+---------+-----------+----------+--------------+   +---------+---------------+---------+-----------+----------+--------------+ LEFT     CompressibilityPhasicitySpontaneityPropertiesThrombus Aging +---------+---------------+---------+-----------+----------+--------------+ CFV      Full           Yes      Yes                                 +---------+---------------+---------+-----------+----------+--------------+  SFJ      Full                                                        +---------+---------------+---------+-----------+----------+--------------+ FV Prox  Full                                                        +---------+---------------+---------+-----------+----------+--------------+ FV Mid   Full                                                        +---------+---------------+---------+-----------+----------+--------------+ FV DistalFull                                                        +---------+---------------+---------+-----------+----------+--------------+ PFV      Full                                                        +---------+---------------+---------+-----------+----------+--------------+ POP      Full           Yes      Yes                                 +---------+---------------+---------+-----------+----------+--------------+ PTV      Full                                                        +---------+---------------+---------+-----------+----------+--------------+ PERO     Full                                                        +---------+---------------+---------+-----------+----------+--------------+  Summary: BILATERAL: - No evidence of deep vein thrombosis seen in the lower extremities, bilaterally. -No evidence of popliteal cyst, bilaterally.   *See table(s) above for measurements and observations. Electronically signed by Harold Barban MD on 05/29/2020 at 10:08:59 PM.     Final       Scheduled Meds: . aspirin EC  81 mg Oral Daily  . furosemide  20 mg Intravenous BID  . polyethylene glycol  17 g Oral Daily  . potassium chloride  40 mEq Oral Once   Continuous Infusions: . heparin 1,250 Units/hr (05/30/20 0614)     LOS: 2 days      Time spent:  25 minutes   Dessa Phi, DO [facility] 05/30/2020, 11:30 AM   Available via Epic secure chat 7am-7pm After these hours, please refer to coverage provider listed on [email or web address]

## 2020-05-30 NOTE — Consult Note (Signed)
Goals of care conversation with eldest son Jori Moll over the phone.  He and his brother [name] share in decision making for their mother [name].  Fortunately, [name] has a living will with wishes for comfort measures vs aggressive care at or near end of life.  [name] is bringing in a copy for our records.  We discussed the goals for [name] and they are for comfort and to return to [place] possibly with hospice care.    Assessment:  [name] is in bed with grimaced facial expressions, moderately agitated.  Nurse at bedside reports that patient has had fentanyl 2 times as ordered without much change in agitation or facial grimaces.  Plan is to d/c fentanyl and start IV ativan, 0.5mg  to obtain symptom management with the goal of comfort care in mind, as well as PRN Haldol and Roxanol should her symptoms worsen.  I don't think her agitation or grimaces are do to any acute pain condition rather a presentation of her advanced dementia and environment change. Will also check for fecal impaction due inability to obtain information regarding her last BM.  Consult discussed with Dr. Hilma Favors, agreed with goals of care, orders placed.  Kizzie Fantasia, MSN, RN-BC, [facility], [facility] Palliative Clinical Specialist   [address] [place] Progression Recent Vital Signs   BP 97/78   Pulse 81   Temp 97.8 F (36.6 C) (Oral)   Resp 18   Ht 5\' 3"  (1.6 m)   Wt 69.5 kg   SpO2 97%   BMI 27.14 kg/m    Past Medical History:  Diagnosis Date  . Anemia, unspecified   . Anxiety state, unspecified   . Atrial fibrillation (HCC)    not anticoagulation candidate due to falls  . CAD (coronary artery disease)    s/p CABG  . Carotid artery stenosis   . Complete heart block ([place]) 12/2011   s/p St.Jude dual chamber PPM 01/12/12  . Dementia ([place])   . Dementia ([place]) 05/09/2017  . Diverticulosis of colon (without mention of hemorrhage)   . Esophageal reflux   . HTN (hypertension)   . Hyperlipidemia   . Osteoarthrosis,  unspecified whether generalized or localized, unspecified site   . Osteoporosis, unspecified   . Pacemaker-St.Jude 01/13/2012  . S/P AVR (aortic valve replacement) 2003   bioprosthetic   . Thoracic aortic aneurysm ([place]) 2003   s/p repair  . Unspecified chronic bronchitis [facility])      Expected Discharge Date     Diet Order            Diet Heart Room service appropriate? No; Fluid consistency: Thin  Diet effective now                  VTE Documentation  Other (Comment)   Work Intensity Score/Level of Care  3  @LEVELOFCARE @   Mobility  Head of Bed Elevated : HOB 45 Activity: Turned to back (supine) Range of Motion/Exercises: Active,All extremities Level of Assistance: Dependent, patient does less than 25% Mobility Response: Tolerated well Mobility performed by: Nurse Bed Position: Semi-fowlers Transport method: Bed     Consult Orders  (From admission, onward)         Start     Ordered   05/30/20 0714  Consult to palliative care  Once       Provider:  (Not yet assigned)  Question Answer [place] Palliative Medicine Consult   Reason for Consult? Goals of care  05/30/20 0713   05/29/20 0208  PT eval and treat  Routine        05/29/20 0207           Significant Events    DC Barriers   Abnormal Labs:  Kizzie Fantasia 05/30/2020, 1:26 PM of Ativan

## 2020-05-30 NOTE — Progress Notes (Signed)
Checked for fecal impaction, no stool in rectal vault, small smears on bed pad, cleaned and turned.  Patient tolerated well and seems much more comfortable after the [place]

## 2020-05-30 NOTE — Progress Notes (Signed)

## 2020-05-30 NOTE — Evaluation (Signed)
Occupational Therapy Evaluation Patient Details Name: [name] MRN: [id number] DOB: 1923-09-24 Today's Date: 05/30/2020    History of Present Illness AMMA CREAR is a 87 y.o. female with medical history significant of advanced dementia, CKD stage III, paroxysmal A. fib not on anticoagulation, CAD status post CABG, carotid artery stenosis, complete heart block status post PPM 2013, hypertension, hyperlipidemia, thoracic aortic aneurysm status post repair 2013, bioprosthetic AVR in 2003 presenting to the ED via EMS for evaluation of altered mental status.  She was satting 88% on room air with EMS, placed on 2 L supplemental oxygen and sats improved to 96%.   Clinical Impression   Pt. Was agitated and had taken off her gown when therapist entered room. Pt. Was rolling to R side and was holding onto rail. Pt. Was assisted back into gown at [facility] A level. Pt. Was only answering yes to all questions. Pt. Was able to wash face with s and cues for coverage. Pt. Kept attempting to take off gown. Pt. Then took off protective wrapping around iv and nursing notified. Pt. Had arm re-warped and nursing verbalized ok to have mitts placed back on pt. At end of session. Pt. Is not appropriate for skilled ot at this time secondary to cognition. She can be assessed back at her facility for change from baseline. Acute ot to sing off.     Follow Up Recommendations  Other (comment) (return to memory care)    Equipment Recommendations  None recommended by OT    Recommendations for Other Services       Precautions / Restrictions Precautions Precautions: Fall Precaution Comments: Per records patient had a recent fall at facility. Restrictions Weight Bearing Restrictions: No      Mobility Bed Mobility Overal bed mobility: Needs Assistance Bed Mobility: Rolling Rolling:  (pt. was reaching and holding onto bed rail to pull herself to the R. Pt. was not following bed mobility commands.)               Transfers                 General transfer comment: unable to attempts secondary to agitation    Balance                                           ADL either performed or assessed with clinical judgement   ADL Overall ADL's : Needs assistance/impaired (Pt. is most likely at baseline for ADLs.)     Grooming: Bed level;Maximal assistance   Upper Body Bathing: Total assistance;Bed level   Lower Body Bathing: Total assistance;Bed level   Upper Body Dressing : Maximal assistance;Bed level   Lower Body Dressing: Total assistance;Bed level               Functional mobility during ADLs:  (Pt. very agiated and unable to try to move her OOB.) General ADL Comments: Bed level ADLs with patient not consistantly following directions. Pt. was able to assist wtih washing her face and donning gown.     Vision Baseline Vision/History:  (unknown) Patient Visual Report:  (Pt. unable to state)       Perception     Praxis      Pertinent Vitals/Pain Pain Assessment: Faces Pain Score: 0-No pain     Hand Dominance     Extremity/Trunk Assessment Upper Extremity Assessment Upper Extremity Assessment: Difficult to  assess due to impaired cognition (she is able to move volitionally and AAROM is wnl.)   Lower Extremity Assessment Lower Extremity Assessment: Defer to PT evaluation       Communication Communication Communication: Expressive difficulties   Cognition Arousal/Alertness: Awake/alert Behavior During Therapy: Agitated Overall Cognitive Status: History of cognitive impairments - at baseline                                 General Comments: Pt. was only answering yes to all questions asked.   General Comments       Exercises     Shoulder Instructions      Home Living Family/patient expects to be discharged to:: Assisted living                                 Additional Comments: Pt in memory care at  [facility] per chart      Prior Functioning/Environment Level of Independence: Needs assistance  Gait / Transfers Assistance Needed: per chart she was amb with walker ADL's / Homemaking Assistance Needed: Pt. has advanced dementia and is not able to provide information Communication / Swallowing Assistance Needed: Per chart she would only answer yes/no questions.          OT Problem List:        OT Treatment/Interventions:      OT Goals(Current goals can be found in the care plan section) Acute Rehab OT Goals Patient Stated Goal: none stated  OT Frequency:     Barriers to D/C:            Co-evaluation              AM-PAC OT "6 Clicks" Daily Activity     Outcome Measure Help from another person eating meals?: Total Help from another person taking care of personal grooming?: A Lot Help from another person toileting, which includes using toliet, bedpan, or urinal?: Total Help from another person bathing (including washing, rinsing, drying)?: Total Help from another person to put on and taking off regular upper body clothing?: A Lot Help from another person to put on and taking off regular lower body clothing?: Total 6 Click Score: 8   End of Session Nurse Communication:  (nurse notified that pt. had taken off his iv protective wrapping.)  Activity Tolerance: Treatment limited secondary to agitation Patient left: in bed;with call bell/phone within reach;with bed alarm set;with restraints reapplied (nurse ok putting mittens on pt. at end of session.)                   Time: 1130-1156 OT Time Calculation (min): 26 min Charges:  OT General Charges $OT Visit: 1 Visit OT Evaluation $OT Eval Moderate Complexity: 1 Mod  Reece Packer OT/L   [place] 05/30/2020, 1:02 PM

## 2020-05-30 NOTE — Progress Notes (Signed)
ANTICOAGULATION CONSULT NOTE  Pharmacy Consult:  Heparin Indication:  Rule out ACS  Allergies  Allergen Reactions  . Coumadin [Warfarin Sodium] Other (See Comments)    hallucinations   . Clarithromycin Nausea Only and Other (See Comments)  . Iohexol      Desc: sob with ivp approx 20 yrs ago, pt was premedicated with 13hr prep with no problems kdean, Onset Date: [id number]   . Nsaids Nausea Only  . Penicillins Other (See Comments)    Abdominal pain  . Prednisone Nausea Only and Swelling  . Statins Other (See Comments)    REACTION: abd pain and dark stools  . Sulfonamide Derivatives Nausea Only and Other (See Comments)    Abdominal pain     Patient Measurements: Height: 5\' 3"  (160 cm) Weight: 69.5 kg (153 lb 3.5 oz) IBW/kg (Calculated) : 52.4 Heparin Dosing Weight: 67.8 kg  Vital Signs: Temp: 97.8 F (36.6 C) (03/10 0300) Temp Source: Oral (03/10 0300) BP: 97/78 (03/10 0300) Pulse Rate: 81 (03/10 0300)  Labs: Recent Labs    05/28/20 1826 05/29/20 0239 05/29/20 0245 05/29/20 0534 05/29/20 1353 05/30/20 0653 05/30/20 1222  HGB 12.7 12.2 11.8*  --   --  11.5*  --   HCT 40.2 36.0 37.6  --   --  35.7*  --   PLT 225  --  196  --   --  190  --   HEPARINUNFRC  --   --   --   --  <0.10* 0.16* 0.51  CREATININE 2.06*  --  1.85*  --   --  1.67*  --   TROPONINIHS  --   --  290* 243*  --   --   --     Estimated Creatinine Clearance: 19.3 mL/min (A) (by C-G formula based on SCr of 1.67 mg/dL (H)).   Assessment: 87 yo lady presented with AMS.  D-dimer > 20 and IV heparin started for rule out VTE.  Both Doppler and VQ scan are negative. Patient has aneurysmal dilatation of the aorta.  Heparin level therapeutic this afternoon at 0.51, CBC stable. IV infiltrated overnight and patient pulled out and she had some bleeding from this site. Heparin was restarted at 0614. Per cardiology, plan heparin x 48 hours (started 3/9 @ 1275).  Goal of Therapy:  Heparin level 0.3-0.7  units/ml Monitor platelets by anticoagulation protocol: Yes   Plan:  Continue heparin gtt at 1250 units/hr Re-check anti-Xa level in 8 hours Continue to monitor H&H and platelets   Thank you for allowing [place] to participate in this patients care. Jens Som, PharmD 05/30/2020 1:51 PM  Please check [email or web address] for unit-specific pharmacy phone numbers.

## 2020-05-30 NOTE — Progress Notes (Addendum)
ANTICOAGULATION CONSULT NOTE - Follow Up Consult  Pharmacy Consult:  IV Heparin Indication:  Rule Out ACS  Allergies  Allergen Reactions  . Coumadin [Warfarin Sodium] Other (See Comments)    hallucinations   . Clarithromycin Nausea Only and Other (See Comments)  . Iohexol      Desc: sob with ivp approx 20 yrs ago, pt was premedicated with 13hr prep with no problems kdean, Onset Date: [id number]   . Nsaids Nausea Only  . Penicillins Other (See Comments)    Abdominal pain  . Prednisone Nausea Only and Swelling  . Statins Other (See Comments)    REACTION: abd pain and dark stools  . Sulfonamide Derivatives Nausea Only and Other (See Comments)    Abdominal pain     Patient Measurements: Height: 5\' 3"  (160 cm) Weight: 69.5 kg (153 lb 3.5 oz) IBW/kg (Calculated) : 52.4 Heparin Dosing Weight: 67.8 kg  Vital Signs: Temp: 98.1 F (36.7 C) (03/10 1407) BP: 131/62 (03/10 1407) Pulse Rate: 70 (03/10 1407)  Labs: Recent Labs    05/28/20 1826 05/29/20 0239 05/29/20 0245 05/29/20 0534 05/29/20 1353 05/30/20 0653 05/30/20 1222  HGB 12.7 12.2 11.8*  --   --  11.5*  --   HCT 40.2 36.0 37.6  --   --  35.7*  --   PLT 225  --  196  --   --  190  --   HEPARINUNFRC  --   --   --   --  <0.10* 0.16* 0.51  CREATININE 2.06*  --  1.85*  --   --  1.67*  --   TROPONINIHS  --   --  290* 243*  --   --   --     Estimated Creatinine Clearance: 19.3 mL/min (A) (by C-G formula based on SCr of 1.67 mg/dL (H)).   Assessment: 87 yr old woman presented with AMS.  D-dimer was > 20, and IV heparin was started for R/O VTE.  Both Doppler and VQ scan were negative. Patient has aneurysmal dilatation of the aorta.  Heparin level was therapeutic this afternoon at 0.51 units/ml, which is within the goal range for this pt; H/H stable, plt stable. Pt's IV infiltrated overnight and patient pulled out IV, resulting in  bleeding from the site. Heparin was restarted at 0614 AM today. Per cardiology, plan is to  continue heparin X 48 hours (until 0700 tomorrow, 3/11).  Confirmatory heparin level drawn ~8 hrs after initial therapeutic heparin level was <0.10 units/ml (undetectable), which is below the goal range for this pt. Per RN, when she arrived to work at ~1900, the heparin infusion had been turned off and has been off up until this point. Palliative care has been consulted to see pt, but pt still has active order for heparin at this point (scheduled to end at 0700 tomorrow AM). RN was advised to restart heparin infusion at 1250 units/hr. No bleeding issues observed, per RN.  I also contacted the on-call hospitalist, Dr. Inda Merlin, who said to continue the heparin infusion as ordered overnight.  Goal of Therapy:  Heparin level 0.3-0.7 units/ml Monitor platelets by anticoagulation protocol: Yes   Plan:  Restart heparin infusion at 1250 units/hr and continue until 0700 tomorrow AM, 05/31/20 Check heparin level in 8 hours Monitor daily heparin level, CBC Monitor for bleeding  Gillermina Hu, PharmD, BCPS, [facility] Clinical Pharmacist 05/30/2020 7:17 PM

## 2020-05-30 NOTE — Progress Notes (Signed)
Progress Note  Patient Name: [name] Date of Encounter: 05/30/2020  [facility] Cardiologist: Kirk Ruths, MD   Subjective   Unable to assess.  Intermittently moaning.  Inpatient Medications    Scheduled Meds: . furosemide  20 mg Intravenous BID  . polyethylene glycol  17 g Oral Daily  . potassium chloride  40 mEq Oral Once   Continuous Infusions: . heparin 1,250 Units/hr (05/30/20 0932)   PRN Meds: acetaminophen **OR** acetaminophen, fentaNYL (SUBLIMAZE) injection   Vital Signs    Vitals:   05/29/20 1400 05/29/20 2120 05/30/20 0300 05/30/20 0600  BP: 125/78 122/83 97/78   Pulse:  81 81   Resp:  20 18   Temp:  98.4 F (36.9 C) 97.8 F (36.6 C)   TempSrc:  Oral Oral   SpO2:  97% 97%   Weight:    69.5 kg  Height:        Intake/Output Summary (Last 24 hours) at 05/30/2020 1011 Last data filed at 05/30/2020 1007 Gross per 24 hour  Intake 790.19 ml  Output 400 ml  Net 390.19 ml   Last 3 Weights 05/30/2020 05/29/2020 02/28/2020  Weight (lbs) 153 lb 3.5 oz 162 lb 162 lb 6.4 oz  Weight (kg) 69.5 kg 73.483 kg 73.664 kg      Telemetry    Atrial fibrillation.  VP. - Personally Reviewed  ECG    n/a - Personally Reviewed  Physical Exam   VS:  BP 97/78   Pulse 81   Temp 97.8 F (36.6 C) (Oral)   Resp 18   Ht 5\' 3"  (1.6 m)   Wt 69.5 kg   SpO2 97%   BMI 27.14 kg/m  , BMI Body mass index is 27.14 kg/m. GENERAL:  Chronically ill-appearing.  Intermittently moaning.  Less dyspneic. HEENT: Pupils equal round and reactive, fundi not visualized, oral mucosa unremarkable NECK:  No jugular venous distention, waveform within normal limits, carotid upstroke brisk and symmetric, no bruits LUNGS:  Clear to auscultation bilaterally HEART:  RRR.  PMI not displaced or sustained,S1 and S2 within normal limits, no S3, no S4, no clicks, no rubs, no murmurs ABD:  Flat, positive bowel sounds normal in frequency in pitch, no bruits, no rebound, no guarding, no midline  pulsatile mass, no hepatomegaly, no splenomegaly EXT:  2 plus pulses throughout, no edema, no cyanosis no clubbing SKIN:  No rashes no nodules NEURO:  Cranial nerves II through XII grossly intact, motor grossly intact throughout [facility]:  Cognitively intact, oriented to person place and time   Labs    High Sensitivity Troponin:   Recent Labs  Lab 05/29/20 0245 05/29/20 0534  TROPONINIHS 290* 243*      Chemistry Recent Labs  Lab 05/28/20 1826 05/28/20 2125 05/29/20 0239 05/29/20 0245 05/30/20 0653  NA 137  --  138 138 136  K 4.3  --  4.0 3.9 3.4*  CL 101  --   --  102 101  CO2 18*  --   --  21* 24  GLUCOSE 165*  --   --  149* 100*  BUN 29*  --   --  31* 32*  CREATININE 2.06*  --   --  1.85* 1.67*  CALCIUM 9.5  --   --  9.0 8.6*  PROT  --  7.1  --   --   --   ALBUMIN  --  3.2*  --   --   --   AST  --  31  --   --   --  ALT  --  17  --   --   --   ALKPHOS  --  88  --   --   --   BILITOT  --  1.2  --   --   --   GFRNONAA 22*  --   --  25* 28*  ANIONGAP 18*  --   --  15 11     Hematology Recent Labs  Lab 05/28/20 1826 05/29/20 0239 05/29/20 0245 05/30/20 0653  WBC 16.9*  --  12.2* 10.7*  RBC 4.55  --  4.23 4.13  HGB 12.7 12.2 11.8* 11.5*  HCT 40.2 36.0 37.6 35.7*  MCV 88.4  --  88.9 86.4  MCH 27.9  --  27.9 27.8  MCHC 31.6  --  31.4 32.2  RDW 17.8*  --  17.9* 17.3*  PLT 225  --  196 190    BNP Recent Labs  Lab 05/29/20 0245  BNP 562.1*     DDimer  Recent Labs  Lab 05/29/20 0245  DDIMER >20.00*     Radiology    DG Chest 2 View  Result Date: 05/28/2020 CLINICAL DATA:  Hypoxia EXAM: CHEST - 2 VIEW COMPARISON:  06/14/2019 FINDINGS: Unchanged markedly widened upper mediastinum consistent with known thoracic aortic aneurysm. Unchanged position of left chest wall pacemaker leads. Remote median sternotomy. There is pulmonary vascular congestion without overt edema. No pleural effusion or pneumothorax. No focal airspace consolidation. IMPRESSION: 1.  Pulmonary vascular congestion without overt edema. 2. Unchanged appearance of known thoracic aortic aneurysm. Electronically Signed   By: Ulyses Jarred M.D.   On: 05/28/2020 19:17   CT HEAD WO CONTRAST  Result Date: 05/28/2020 CLINICAL DATA:  Dementia, tremors, difficulty standing EXAM: CT HEAD WITHOUT CONTRAST TECHNIQUE: Contiguous axial images were obtained from the base of the skull through the vertex without intravenous contrast. COMPARISON:  12/26/2019 FINDINGS: Brain: Stable chronic small-vessel ischemic changes are seen throughout the periventricular white matter and bilateral basal ganglia. No signs of acute infarct or hemorrhage. Lateral ventricles and midline structures are stable. No acute extra-axial fluid collections. No mass effect. Vascular: No hyperdense vessel or unexpected calcification. Skull: Normal. Negative for fracture or focal lesion. Sinuses/Orbits: No acute finding. Other: None. IMPRESSION: 1. Stable chronic small vessel ischemic changes. No acute intracranial process. Electronically Signed   By: Randa Ngo M.D.   On: 05/28/2020 20:25   CT Cervical Spine Wo Contrast  Result Date: 05/28/2020 CLINICAL DATA:  Neck trauma, weakness EXAM: CT CERVICAL SPINE WITHOUT CONTRAST TECHNIQUE: Multidetector CT imaging of the cervical spine was performed without intravenous contrast. Multiplanar CT image reconstructions were also generated. COMPARISON:  12/27/2019 FINDINGS: Alignment: Stable mild anterolisthesis of C3 on C4 and C4 on C5. Otherwise alignment is anatomic. Skull base and vertebrae: No acute fracture. No primary bone lesion or focal pathologic process. Soft tissues and spinal canal: No prevertebral fluid or swelling. No visible canal hematoma. Disc levels: Mild diffuse facet hypertrophic changes throughout the cervical spine. Prominent cervical spondylosis at C5-6, with mild symmetrical neural foraminal encroachment. Upper chest: Airway is patent. Lung apices are clear. Partial  visualization of a known thoracic aortic aneurysm. Other: Reconstructed images demonstrate no additional findings. IMPRESSION: 1. No acute cervical spine fracture. 2. Stable multilevel cervical facet hypertrophy and spondylosis, most pronounced at C5-6. 3. Stable partial visualization of a known thoracic aortic aneurysm. Electronically Signed   By: Randa Ngo M.D.   On: 05/28/2020 19:51   CT Hip Right Wo Contrast  Result Date:  05/29/2020 CLINICAL DATA:  Right hip pain EXAM: CT OF THE RIGHT HIP WITHOUT CONTRAST TECHNIQUE: Multidetector CT imaging of the right hip was performed according to the standard protocol. Multiplanar CT image reconstructions were also generated. COMPARISON:  None. FINDINGS: Bones/Joint/Cartilage Left total hip arthroplasty has been performed. The pelvis and right hip are intact. No acute fracture or dislocation. There is severe right hip degenerative arthritis with complete loss of the joint space, remodeling of the femoral head and acetabulum, extensive osteophyte formation, and subchondral sclerosis and cyst formation. The osseous structures are diffusely osteopenic. Degenerative changes are noted within the visualized lumbar spine. Ligaments Suboptimally assessed by CT. Muscles and Tendons There is asymmetric atrophy and fatty infiltration of the right gluteal and tensor fascia lata musculature. The ileo psoas musculature is markedly atrophic bilaterally though the iliopsoas tendon appears intact. Gluteal tendon and hamstring tendons appear intact. Soft tissues The visualized bowel is unremarkable. No free fluid within the pelvis. Bladder is unremarkable. Uterus absent. No adnexal masses. Soft tissues of the body wall are unremarkable. IMPRESSION: No acute fracture or dislocation of the right hip. Severe right hip degenerative arthritis with acetabular and femoral head remodeling. Electronically Signed   By: Fidela Salisbury MD   On: 05/29/2020 00:29   NM Pulmonary Perf and  Vent  Result Date: 05/29/2020 CLINICAL DATA:  Shortness of breath/hypoxia EXAM: NUCLEAR MEDICINE PERFUSION LUNG SCAN TECHNIQUE: Perfusion images were obtained in multiple projections after intravenous injection of radiopharmaceutical. Views: Anterior, posterior, left lateral, right lateral, RPO, LPO, RAO, LAO RADIOPHARMACEUTICALS:  4.1 mCi Tc-64m MAA IV COMPARISON:  Chest radiograph May 28, 2020 FINDINGS: Radiotracer uptake is homogeneous and symmetric bilaterally. No perfusion defects are evident. Marked prominence of the thoracic aorta is noted. IMPRESSION: No appreciable perfusion defects. No findings indicative of pulmonary embolus. Dilatation of the thoracic aorta noted on this study, correlating with findings on chest radiograph. Electronically Signed   By: Lowella Grip III M.D.   On: 05/29/2020 13:36   DG CHEST PORT 1 VIEW  Result Date: 05/29/2020 CLINICAL DATA:  Weakness and shortness of breath EXAM: PORTABLE CHEST 1 VIEW COMPARISON:  05/28/2020 FINDINGS: Cardiac shadow remains enlarged. Pacing device and postsurgical changes are again seen. Tortuous thoracic aorta with aneurysmal dilatation is again noted similar to that seen on prior exams. No focal infiltrate or sizable effusion is seen. No bony abnormality is noted. IMPRESSION: Chronic dilatation of the ascending aorta. No other focal abnormality is noted. Electronically Signed   By: Inez Catalina M.D.   On: 05/29/2020 19:18   ECHOCARDIOGRAM COMPLETE  Result Date: 05/29/2020    ECHOCARDIOGRAM REPORT   Patient Name:   NIKETA TURNER Barre Date of Exam: 05/29/2020 Medical Rec #:  [id number]   Height:       63.0 in Accession #:    [id number]  Weight:       162.0 lb Date of Birth:  02-Jan-1924   BSA:          1.768 m Patient Age:    64 years    BP:           130/71 mmHg Patient Gender: F           HR:           79 bpm. Exam Location:  Inpatient Procedure: 2D Echo Indications:    CHF-Acute Diastolic U44.03  History:        Patient has prior history of  Echocardiogram examinations, most  recent 06/30/2012. CAD, Prior CABG; Risk Factors:Hypertension and                 Dyslipidemia.                 Aortic Valve: unknown valve is present in the aortic position.                 Procedure Date: 2003.  Sonographer:    Mikki Santee RDCS (AE) Referring Phys: [id number] [place]  1. Left ventricular ejection fraction, by estimation, is 45 to 50%. The left ventricle has mildly decreased function. The left ventricle demonstrates global hypokinesis. Left ventricular diastolic parameters are indeterminate.  2. Right ventricular systolic function is normal. The right ventricular size is normal. There is normal pulmonary artery systolic pressure.  3. The mitral valve is normal in structure. No evidence of mitral valve regurgitation.  4. The aortic valve has been repaired/replaced. Aortic valve regurgitation is not visualized. There is a unknown valve present in the aortic position. Procedure Date: 2003. Comparison(s): A prior study was performed on 06/28/2012. Stable valve parameters; decreased LVEF. FINDINGS  Left Ventricle: Left ventricular ejection fraction, by estimation, is 45 to 50%. The left ventricle has mildly decreased function. The left ventricle demonstrates global hypokinesis. The left ventricular internal cavity size was normal in size. There is  no left ventricular hypertrophy. Abnormal (paradoxical) septal motion consistent with post-operative status. Left ventricular diastolic parameters are indeterminate. Right Ventricle: The right ventricular size is normal. No increase in right ventricular wall thickness. Right ventricular systolic function is normal. There is normal pulmonary artery systolic pressure. The tricuspid regurgitant velocity is 2.01 m/s, and  with an assumed right atrial pressure of 8 mmHg, the estimated right ventricular systolic pressure is 38.7 mmHg. Left Atrium: Left atrial size was normal in size. Right  Atrium: Right atrial size was normal in size. Pericardium: There is no evidence of pericardial effusion. Mitral Valve: The mitral valve is normal in structure. No evidence of mitral valve regurgitation. Tricuspid Valve: The tricuspid valve is grossly normal. Tricuspid valve regurgitation is not demonstrated. No evidence of tricuspid stenosis. Aortic Valve: DVI 0.36. The aortic valve has been repaired/replaced. Aortic valve regurgitation is not visualized. Aortic valve mean gradient measures 7.5 mmHg. Aortic valve peak gradient measures 14.4 mmHg. Aortic valve area, by VTI measures 1.14 cm. There is a unknown valve present in the aortic position. Procedure Date: 2003. Pulmonic Valve: The pulmonic valve was not well visualized. Pulmonic valve regurgitation is not visualized. No evidence of pulmonic stenosis. Aorta: The aortic root is normal in size and structure. IAS/Shunts: The atrial septum is grossly normal.  LEFT VENTRICLE PLAX 2D LVIDd:         4.40 cm  Diastology LVIDs:         3.70 cm  LV e' medial:    7.83 cm/s LV PW:         1.00 cm  LV E/e' medial:  11.6 LV IVS:        1.00 cm  LV e' lateral:   11.10 cm/s LVOT diam:     2.00 cm  LV E/e' lateral: 8.2 LV SV:         35 LV SV Index:   20 LVOT Area:     3.14 cm  RIGHT VENTRICLE TAPSE (M-mode): 1.0 cm LEFT ATRIUM             Index       RIGHT ATRIUM  Index LA diam:        4.10 cm 2.32 cm/m  RA Area:     11.50 cm LA Vol (A2C):   36.1 ml 20.42 ml/m RA Volume:   24.60 ml  13.91 ml/m LA Vol (A4C):   40.1 ml 22.68 ml/m LA Biplane Vol: 41.0 ml 23.19 ml/m  AORTIC VALVE AV Area (Vmax):    1.10 cm AV Area (Vmean):   1.07 cm AV Area (VTI):     1.14 cm AV Vmax:           190.00 cm/s AV Vmean:          124.500 cm/s AV VTI:            0.312 m AV Peak Grad:      14.4 mmHg AV Mean Grad:      7.5 mmHg LVOT Vmax:         66.80 cm/s LVOT Vmean:        42.400 cm/s LVOT VTI:          0.113 m LVOT/AV VTI ratio: 0.36  AORTA Ao Root diam: 2.10 cm MITRAL VALVE                 TRICUSPID VALVE MV Area (PHT): 3.54 cm     TR Peak grad:   16.2 mmHg MV Decel Time: 214 msec     TR Vmax:        201.00 cm/s MV E velocity: 90.70 cm/s MV A velocity: 114.00 cm/s  SHUNTS MV E/A ratio:  0.80         Systemic VTI:  0.11 m                             Systemic Diam: 2.00 cm Rudean Haskell MD Electronically signed by Rudean Haskell MD Signature Date/Time: 05/29/2020/12:47:47 PM    Final    DG HIP UNILAT WITH PELVIS 2-3 VIEWS LEFT  Result Date: 05/28/2020 CLINICAL DATA:  Difficulty standing. EXAM: DG HIP (WITH OR WITHOUT PELVIS) 2-3V LEFT COMPARISON:  Radiograph 12/11/2019 FINDINGS: Left hip arthroplasty in expected alignment. No periprosthetic lucency or fracture. Bones are diffusely under mineralized. The pubic rami are intact. Right hip better assessed on concurrent right hip exam, reported separately. There are vascular calcifications. IMPRESSION: Left hip arthroplasty in expected alignment without complication. Electronically Signed   By: Keith Rake M.D.   On: 05/28/2020 20:46   DG HIP UNILAT WITH PELVIS 2-3 VIEWS RIGHT  Result Date: 05/28/2020 CLINICAL DATA:  Weakness and difficulty standing. EXAM: DG HIP (WITH OR WITHOUT PELVIS) 2-3V RIGHT COMPARISON:  12/10/2019 FINDINGS: Advanced right hip degenerative change. Complete right hip joint space loss with flattening and remodeling of the femoral head, subchondral cystic change in bony fragmentation. Findings are chronic and not significantly changed. No acute fracture. Pubic rami are intact. IMPRESSION: Advanced right hip degenerative change without acute abnormality. Electronically Signed   By: Keith Rake M.D.   On: 05/28/2020 20:47   VAS [place] LOWER EXTREMITY VENOUS (DVT)  Result Date: 05/29/2020  Lower Venous DVT Study Indications: Swelling. Other Indications: D-Dimer. Risk Factors: None identified. Limitations: Patient positioning. unable to position legs appropriately for exam. Comparison Study: No previous  Performing Technologist: Vonzell Schlatter RVT  Examination Guidelines: A complete evaluation includes B-mode imaging, spectral Doppler, color Doppler, and power Doppler as needed of all accessible portions of each vessel. Bilateral testing is considered an integral part of a complete examination. Limited  examinations for reoccurring indications may be performed as noted. The reflux portion of the exam is performed with the patient in reverse Trendelenburg.  +---------+---------------+---------+-----------+----------+--------------+ RIGHT    CompressibilityPhasicitySpontaneityPropertiesThrombus Aging +---------+---------------+---------+-----------+----------+--------------+ CFV      Full           Yes      Yes                                 +---------+---------------+---------+-----------+----------+--------------+ SFJ      Full                                                        +---------+---------------+---------+-----------+----------+--------------+ FV Prox  Full                                                        +---------+---------------+---------+-----------+----------+--------------+ FV Mid   Full                                                        +---------+---------------+---------+-----------+----------+--------------+ FV DistalFull                                                        +---------+---------------+---------+-----------+----------+--------------+ PFV      Full                                                        +---------+---------------+---------+-----------+----------+--------------+ POP      Full           Yes      Yes                                 +---------+---------------+---------+-----------+----------+--------------+ PTV      Full                                                        +---------+---------------+---------+-----------+----------+--------------+ PERO     Full                                                         +---------+---------------+---------+-----------+----------+--------------+   +---------+---------------+---------+-----------+----------+--------------+ LEFT     CompressibilityPhasicitySpontaneityPropertiesThrombus Aging +---------+---------------+---------+-----------+----------+--------------+ CFV      Full  Yes      Yes                                 +---------+---------------+---------+-----------+----------+--------------+ SFJ      Full                                                        +---------+---------------+---------+-----------+----------+--------------+ FV Prox  Full                                                        +---------+---------------+---------+-----------+----------+--------------+ FV Mid   Full                                                        +---------+---------------+---------+-----------+----------+--------------+ FV DistalFull                                                        +---------+---------------+---------+-----------+----------+--------------+ PFV      Full                                                        +---------+---------------+---------+-----------+----------+--------------+ POP      Full           Yes      Yes                                 +---------+---------------+---------+-----------+----------+--------------+ PTV      Full                                                        +---------+---------------+---------+-----------+----------+--------------+ PERO     Full                                                        +---------+---------------+---------+-----------+----------+--------------+  Summary: BILATERAL: - No evidence of deep vein thrombosis seen in the lower extremities, bilaterally. -No evidence of popliteal cyst, bilaterally.   *See table(s) above for measurements and observations. Electronically signed by Harold Barban MD  on 05/29/2020 at 10:08:59 PM.    Final     Cardiac Studies   Echo 05/29/20: 1. Left ventricular ejection fraction, by estimation, is 45 to 50%. The  left ventricle has  mildly decreased function. The left ventricle  demonstrates global hypokinesis. Left ventricular diastolic parameters are  indeterminate.  2. Right ventricular systolic function is normal. The right ventricular  size is normal. There is normal pulmonary artery systolic pressure.  3. The mitral valve is normal in structure. No evidence of mitral valve  regurgitation.  4. The aortic valve has been repaired/replaced. Aortic valve  regurgitation is not visualized. There is a unknown valve present in the  aortic position. Procedure Date: 2003.   Patient Profile     87 y.o. female with a hx of paroxysmal atrial fibrillation, CAD status post CABG, complete heart block status post AV replacement.,  Hypertension, hyperlipidemia, bioprosthetic aortic valve replacement, GERD thoracic ascending aortic aneurysm repair, carotid stenosis, and dementia who is being seen today for the evaluation of elevated troponin, acute systolic heart failure.  Assessment & Plan    # Acute systolic and diastolic heart failure:  BNP is elevated, LVEF newly reduced to 45-50% and she has pulmonary edema on CXR.    Ins and outs are not accurately reported as she is incontinent and moving around too much for her pure wick catheter.  However her weight is down from 73.5 kg yesterday to 69.5 kg today.  She is now on room air and her breathing appears to be much more comfortable.  Renal function is improving with diuresis.  Continue with Lasix 20 mg IV twice daily.  Maintain potassium greater than 4, mag greater than 2.  No plans for ischemic ischemic evaluation given her advanced dementia.  # Descending aorta aneurysm:  Massively enlarged but stable.  H/h stable on heparin.   # Elevated troponin:  # CAD s/p CABG:  # Hyperlipidemia:  Continue pravastatin.   Continue 48 hours of IV heparin.  No plan for an ischemia work up.  Resume aspirin 81 mg daily.  Overall her prognosis is quite poor.  She seems to be uncomfortable for unclear reasons.  Consider palliative care/hospice.  She already lives in a memory care unit.      For questions or updates, please contact [place] Please consult [email or web address] for contact info under        Signed, Skeet Latch, MD  05/30/2020, 10:11 AM

## 2020-05-30 NOTE — Progress Notes (Signed)
ANTICOAGULATION CONSULT NOTE  Pharmacy Consult:  Heparin Indication:  Rule out ACS  Allergies  Allergen Reactions  . Coumadin [Warfarin Sodium] Other (See Comments)    hallucinations   . Clarithromycin Nausea Only and Other (See Comments)  . Iohexol      Desc: sob with ivp approx 20 yrs ago, pt was premedicated with 13hr prep with no problems kdean, Onset Date: [id number]   . Nsaids Nausea Only  . Penicillins Other (See Comments)    Abdominal pain  . Prednisone Nausea Only and Swelling  . Statins Other (See Comments)    REACTION: abd pain and dark stools  . Sulfonamide Derivatives Nausea Only and Other (See Comments)    Abdominal pain     Patient Measurements: Height: 5\' 3"  (160 cm) Weight: 69.5 kg (153 lb 3.5 oz) IBW/kg (Calculated) : 52.4 Heparin Dosing Weight: 67.8 kg  Vital Signs: Temp: 97.8 F (36.6 C) (03/10 0300) Temp Source: Oral (03/10 0300) BP: 97/78 (03/10 0300) Pulse Rate: 81 (03/10 0300)  Labs: Recent Labs    05/28/20 1826 05/29/20 0239 05/29/20 0245 05/29/20 0534 05/29/20 1353 05/30/20 0653  HGB 12.7 12.2 11.8*  --   --  11.5*  HCT 40.2 36.0 37.6  --   --  35.7*  PLT 225  --  196  --   --  190  HEPARINUNFRC  --   --   --   --  <0.10* 0.16*  CREATININE 2.06*  --  1.85*  --   --   --   TROPONINIHS  --   --  290* 243*  --   --     Estimated Creatinine Clearance: 17.4 mL/min (A) (by C-G formula based on SCr of 1.85 mg/dL (H)).   Assessment: 87 yo lady presented with AMS.  D-dimer > 20 and IV heparin started for rule out VTE.  Both Doppler and VQ scan are negative.  Reached out to MD, continue IV heparin for now for elevated troponins.  Patient has aneurysmal dilatation of the aorta.  Heparin level SUBtherapeutic this morning at 0.16, CBC stable.  IV infiltrated overnight and patient pulled out and she had some bleeding from this site. Got another line, and heparin was restarted at 0614. HL drawn at [phone].  Goal of Therapy:  Heparin level 0.3-0.7  units/ml Monitor platelets by anticoagulation protocol: Yes   Plan:  Continue heparin gtt at 1250 units/hr Re-check anti-Xa level this morning and daily while on heparin Continue to monitor H&H and platelets F/U [facility] plans in AM   Thank you for allowing [place] to participate in this patients care. Jens Som, PharmD 05/30/2020 7:38 AM  Please check [email or web address] for unit-specific pharmacy phone numbers.

## 2020-05-31 LAB — BASIC METABOLIC PANEL
Anion gap: 12 (ref 5–15)
BUN: 30 mg/dL — ABNORMAL HIGH (ref 8–23)
CO2: 23 mmol/L (ref 22–32)
Calcium: 8.8 mg/dL — ABNORMAL LOW (ref 8.9–10.3)
Chloride: 104 mmol/L (ref 98–111)
Creatinine, Ser: 1.69 mg/dL — ABNORMAL HIGH (ref 0.44–1.00)
GFR, Estimated: 28 mL/min — ABNORMAL LOW (ref >60)
Glucose, Bld: 96 mg/dL (ref 70–99)
Potassium: 4.5 mmol/L (ref 3.5–5.1)
Sodium: 139 mmol/L (ref 135–145)

## 2020-05-31 LAB — CBC
HCT: 40.8 % (ref 36.0–46.0)
Hemoglobin: 12.7 g/dL (ref 12.0–15.0)
MCH: 27.6 pg (ref 26.0–34.0)
MCHC: 31.1 g/dL (ref 30.0–36.0)
MCV: 88.7 fL (ref 80.0–100.0)
Platelets: 207 10*3/uL (ref 150–400)
RBC: 4.6 MIL/uL (ref 3.87–5.11)
RDW: 17.9 % — ABNORMAL HIGH (ref 11.5–15.5)
WBC: 8.3 10*3/uL (ref 4.0–10.5)
nRBC: 0 % (ref 0.0–0.2)

## 2020-05-31 LAB — HEPARIN LEVEL (UNFRACTIONATED): Heparin Unfractionated: 0.53 IU/mL (ref 0.30–0.70)

## 2020-05-31 MED ORDER — MORPHINE SULFATE (PF) 2 MG/ML IV SOLN
2.0000 mg | Freq: Three times a day (TID) | INTRAVENOUS | Status: DC
Start: 2020-05-31 — End: 2020-06-04
  Administered 2020-05-31 – 2020-06-03 (×9): 2 mg via INTRAVENOUS
  Filled 2020-05-31 (×9): qty 1

## 2020-05-31 MED ORDER — SODIUM CHLORIDE 0.9% FLUSH
3.0000 mL | Freq: Two times a day (BID) | INTRAVENOUS | Status: DC
  Administered 2020-05-31 – 2020-06-03 (×7): 3 mL via INTRAVENOUS

## 2020-05-31 MED ORDER — GLYCOPYRROLATE 1 MG PO TABS: 1.0000 mg | ORAL_TABLET | ORAL | Status: DC | PRN

## 2020-05-31 MED ORDER — HALOPERIDOL LACTATE 5 MG/ML IJ SOLN
0.5000 mg | INTRAMUSCULAR | Status: DC | PRN
  Administered 2020-06-01: 0.5 mg via INTRAVENOUS
  Filled 2020-05-31: qty 1

## 2020-05-31 MED ORDER — GLYCOPYRROLATE 0.2 MG/ML IJ SOLN: 0.2000 mg | INTRAMUSCULAR | Status: DC | PRN

## 2020-05-31 MED ORDER — MORPHINE SULFATE (PF) 2 MG/ML IV SOLN
2.0000 mg | INTRAVENOUS | Status: DC | PRN
  Administered 2020-05-31 – 2020-06-04 (×8): 2 mg via INTRAVENOUS
  Filled 2020-05-31 (×8): qty 1

## 2020-05-31 MED ORDER — OLANZAPINE 5 MG PO TBDP
5.0000 mg | ORAL_TABLET | Freq: Every day | ORAL | Status: DC
  Administered 2020-05-31 – 2020-06-03 (×4): 5 mg via ORAL
  Filled 2020-05-31 (×4): qty 1

## 2020-05-31 MED ORDER — MORPHINE SULFATE (CONCENTRATE) 10 MG/0.5ML PO SOLN: 5.0000 mg | ORAL | Status: DC | PRN

## 2020-05-31 MED ORDER — ONDANSETRON 4 MG PO TBDP: 4.0000 mg | ORAL_TABLET | Freq: Four times a day (QID) | ORAL | Status: DC | PRN

## 2020-05-31 MED ORDER — SODIUM CHLORIDE 0.9 % IV SOLN: 250.0000 mL | INTRAVENOUS | Status: DC | PRN

## 2020-05-31 MED ORDER — BISACODYL 10 MG RE SUPP: 10.0000 mg | Freq: Every day | RECTAL | Status: DC | PRN

## 2020-05-31 MED ORDER — SODIUM CHLORIDE 0.9% FLUSH
3.0000 mL | INTRAVENOUS | Status: DC | PRN
Start: 2020-05-31 — End: 2020-06-04
  Administered 2020-06-03: 3 mL via INTRAVENOUS

## 2020-05-31 MED ORDER — FUROSEMIDE 20 MG PO TABS
20.0000 mg | ORAL_TABLET | Freq: Every day | ORAL | Status: DC
  Administered 2020-05-31 – 2020-06-01 (×2): 20 mg via ORAL
  Filled 2020-05-31 (×2): qty 1

## 2020-05-31 MED ORDER — POLYVINYL ALCOHOL 1.4 % OP SOLN
1.0000 [drp] | Freq: Four times a day (QID) | OPHTHALMIC | Status: DC | PRN
  Filled 2020-05-31: qty 15

## 2020-05-31 MED ORDER — BIOTENE DRY MOUTH MT LIQD
15.0000 mL | OROMUCOSAL | Status: DC | PRN
Start: 2020-05-31 — End: 2020-06-04

## 2020-05-31 MED ORDER — FUROSEMIDE 20 MG PO TABS: 20.0000 mg | ORAL_TABLET | Freq: Every day | ORAL | Status: DC

## 2020-05-31 MED ORDER — HALOPERIDOL LACTATE 5 MG/ML IJ SOLN: 2.0000 mg | INTRAMUSCULAR | Status: DC | PRN

## 2020-05-31 MED ORDER — HALOPERIDOL LACTATE 5 MG/ML IJ SOLN
2.0000 mg | Freq: Three times a day (TID) | INTRAMUSCULAR | Status: DC
  Administered 2020-05-31 – 2020-06-02 (×4): 2 mg via INTRAVENOUS
  Filled 2020-05-31 (×5): qty 1

## 2020-05-31 MED ORDER — HALOPERIDOL LACTATE 2 MG/ML PO CONC
0.5000 mg | ORAL | Status: DC | PRN
  Filled 2020-05-31: qty 0.3

## 2020-05-31 MED ORDER — HALOPERIDOL 1 MG PO TABS: 0.5000 mg | ORAL_TABLET | ORAL | Status: DC | PRN

## 2020-05-31 MED ORDER — ONDANSETRON HCL 4 MG/2ML IJ SOLN: 4.0000 mg | Freq: Four times a day (QID) | INTRAMUSCULAR | Status: DC | PRN

## 2020-05-31 MED ORDER — HALOPERIDOL LACTATE 5 MG/ML IJ SOLN: 2.0000 mg | Freq: Three times a day (TID) | INTRAMUSCULAR | Status: DC

## 2020-05-31 MED ORDER — MORPHINE SULFATE (CONCENTRATE) 10 MG/0.5ML PO SOLN: 5.0000 mg | Freq: Three times a day (TID) | ORAL | Status: DC

## 2020-05-31 NOTE — Progress Notes (Deleted)
Daily Progress Note   Patient Name: [name]       Date: 05/31/2020 DOB: April 07, 1923  Age: 87 y.o. MRN#: [id number] Attending Physician: Dessa Phi, DO Primary Care Physician: Binnie Rail, MD Admit Date: 05/28/2020  Reason for Consultation/Follow-up:  Symptom management  Chart reviewed.  [name] is due to discharge to long term care with Hospice to follow.  Her prognosis is weeks to months. Discussed patient with RN.  Patient slept all morning then immediately started groaning, appearing agitated when she woke.  RN gave PRN morphine appropriately.  Subjective: [name] is awake - really pretty blue eyes - be she appear agitated and in some discomfort.  She groans.  She just received morphine 10 minutes ago.  Will need to give that some more time to see if it will help.  Suggested that morphine should be scheduled every 8 hours and continue the PRN as well.   Discussed SL administration with RN as it can be given when patient is not alert.   Patient also has PRN haldol available which may work better for her agitation.   Assessment: Elderly female with advanced demented awakened in agitated fashion.  PRNs being used well.   Patient Profile/HPI: 87 yo female with PMH of AVR (st jude), osteoporosis, complete heart block s/p pacemaker, anxiety, afib, and dementia.  Admitted to [facility] on 3/8 with AMS after a recent fall.  She complained of right hip pain and was found to be hypoxic.    Length of Stay: 3   Vital Signs: BP (!) 159/92 (BP Location: Right Arm)   Pulse 78   Temp 98.1 F (36.7 C) (Oral)   Resp 20   Ht 5\' 3"  (1.6 m)   Wt 68.7 kg   SpO2 97%   BMI 26.83 kg/m  SpO2: SpO2: 97 % O2 Device: O2 Device: Room Air O2 Flow Rate: O2 Flow Rate (L/min): 2 L/min       Palliative  Assessment/Data: 20%     Palliative Care Plan    Recommendations/Plan:  Continue with comfort measures.  Added back lasix.   Patient is not yet at EOL.  She may live weeks to months at [facility].  To be followed by Hospice at [facility].  Code Status:  DNR  Prognosis:   Weeks to months.  Discharge Planning:  [place] with Hospice    Thank you for allowing the Palliative Medicine Team to assist in the care of this patient.  Total time spent:  35 min.     Greater than 50%  of this time was spent counseling and coordinating care related to the above assessment and plan.  Florentina Jenny, PA-C Palliative Medicine  Please contact Palliative MedicineTeam phone at [phone] for questions and concerns between 7 am - 7 pm.   Please see AMION for individual provider pager numbers.

## 2020-05-31 NOTE — Progress Notes (Signed)
Progress Note  Patient Name: [name] Date of Encounter: 05/31/2020  [facility] Cardiologist: Kirk Ruths, MD   Subjective   Unable to assess.  Intermittently moaning.  Inpatient Medications    Scheduled Meds: . polyethylene glycol  17 g Oral Daily  . risperiDONE  0.25 mg Oral QHS   Continuous Infusions:  PRN Meds: acetaminophen **OR** acetaminophen, haloperidol lactate, morphine CONCENTRATE   Vital Signs    Vitals:   05/30/20 1407 05/30/20 2138 05/31/20 0500 05/31/20 0549  BP: 131/62 128/65  (!) 159/92  Pulse: 70 71  78  Resp: 20 19  20   Temp: 98.1 F (36.7 C) 98.3 F (36.8 C)  98.1 F (36.7 C)  TempSrc:  Oral  Oral  SpO2: 98% 96%  97%  Weight:   68.7 kg   Height:        Intake/Output Summary (Last 24 hours) at 05/31/2020 0845 Last data filed at 05/31/2020 0612 Gross per 24 hour  Intake 120 ml  Output 450 ml  Net -330 ml   Last 3 Weights 05/31/2020 05/30/2020 05/29/2020  Weight (lbs) 151 lb 7.3 oz 153 lb 3.5 oz 162 lb  Weight (kg) 68.7 kg 69.5 kg 73.483 kg      Telemetry    Atrial fibrillation.  VP. - Personally Reviewed  ECG    n/a - Personally Reviewed  Physical Exam   VS:  BP (!) 159/92 (BP Location: Right Arm)   Pulse 78   Temp 98.1 F (36.7 C) (Oral)   Resp 20   Ht 5\' 3"  (1.6 m)   Wt 68.7 kg   SpO2 97%   BMI 26.83 kg/m  , BMI Body mass index is 26.83 kg/m. GENERAL:  Chronically ill-appearing.  Intermittently moaning.  Less dyspneic. HEENT: Pupils equal round and reactive, fundi not visualized, oral mucosa unremarkable NECK:  No jugular venous distention, waveform within normal limits, carotid upstroke brisk and symmetric, no bruits LUNGS:  Clear to auscultation bilaterally HEART:  RRR.  PMI not displaced or sustained,S1 and S2 within normal limits, no S3, no S4, no clicks, no rubs, III/VI systolic murmur at LUSB ABD:  Flat, positive bowel sounds normal in frequency in pitch, no bruits, no rebound, no guarding, no midline  pulsatile mass, no hepatomegaly, no splenomegaly EXT:  2 plus pulses throughout, no edema, no cyanosis no clubbing SKIN:  No rashes no nodules NEURO:  Cranial nerves II through XII grossly intact, motor grossly intact throughout [facility]:  Cognitively intact, oriented to person place and time   Labs    High Sensitivity Troponin:   Recent Labs  Lab 05/29/20 0245 05/29/20 0534  TROPONINIHS 290* 243*      Chemistry Recent Labs  Lab 05/28/20 1826 05/28/20 2125 05/29/20 0239 05/29/20 0245 05/30/20 0653  NA 137  --  138 138 136  K 4.3  --  4.0 3.9 3.4*  CL 101  --   --  102 101  CO2 18*  --   --  21* 24  GLUCOSE 165*  --   --  149* 100*  BUN 29*  --   --  31* 32*  CREATININE 2.06*  --   --  1.85* 1.67*  CALCIUM 9.5  --   --  9.0 8.6*  PROT  --  7.1  --   --   --   ALBUMIN  --  3.2*  --   --   --   AST  --  31  --   --   --  ALT  --  17  --   --   --   ALKPHOS  --  88  --   --   --   BILITOT  --  1.2  --   --   --   GFRNONAA 22*  --   --  25* 28*  ANIONGAP 18*  --   --  15 11     Hematology Recent Labs  Lab 05/29/20 0245 05/30/20 0653 05/31/20 0553  WBC 12.2* 10.7* 8.3  RBC 4.23 4.13 4.60  HGB 11.8* 11.5* 12.7  HCT 37.6 35.7* 40.8  MCV 88.9 86.4 88.7  MCH 27.9 27.8 27.6  MCHC 31.4 32.2 31.1  RDW 17.9* 17.3* 17.9*  PLT 196 190 207    BNP Recent Labs  Lab 05/29/20 0245  BNP 562.1*     DDimer  Recent Labs  Lab 05/29/20 0245  DDIMER >20.00*     Radiology    NM Pulmonary Perf and Vent  Result Date: 05/29/2020 CLINICAL DATA:  Shortness of breath/hypoxia EXAM: NUCLEAR MEDICINE PERFUSION LUNG SCAN TECHNIQUE: Perfusion images were obtained in multiple projections after intravenous injection of radiopharmaceutical. Views: Anterior, posterior, left lateral, right lateral, RPO, LPO, RAO, LAO RADIOPHARMACEUTICALS:  4.1 mCi Tc-60m MAA IV COMPARISON:  Chest radiograph May 28, 2020 FINDINGS: Radiotracer uptake is homogeneous and symmetric bilaterally. No perfusion  defects are evident. Marked prominence of the thoracic aorta is noted. IMPRESSION: No appreciable perfusion defects. No findings indicative of pulmonary embolus. Dilatation of the thoracic aorta noted on this study, correlating with findings on chest radiograph. Electronically Signed   By: Lowella Grip III M.D.   On: 05/29/2020 13:36   DG CHEST PORT 1 VIEW  Result Date: 05/29/2020 CLINICAL DATA:  Weakness and shortness of breath EXAM: PORTABLE CHEST 1 VIEW COMPARISON:  05/28/2020 FINDINGS: Cardiac shadow remains enlarged. Pacing device and postsurgical changes are again seen. Tortuous thoracic aorta with aneurysmal dilatation is again noted similar to that seen on prior exams. No focal infiltrate or sizable effusion is seen. No bony abnormality is noted. IMPRESSION: Chronic dilatation of the ascending aorta. No other focal abnormality is noted. Electronically Signed   By: Inez Catalina M.D.   On: 05/29/2020 19:18   ECHOCARDIOGRAM COMPLETE  Result Date: 05/29/2020    ECHOCARDIOGRAM REPORT   Patient Name:   [name] Date of Exam: 05/29/2020 Medical Rec #:  [id number]   Height:       63.0 in Accession #:    [id number]  Weight:       162.0 lb Date of Birth:  [birth year]   BSA:          1.768 m Patient Age:    87 years    BP:           130/71 mmHg Patient Gender: F           HR:           79 bpm. Exam Location:  Inpatient Procedure: 2D Echo Indications:    CHF-Acute Diastolic Q25.95  History:        Patient has prior history of Echocardiogram examinations, most                 recent 06/30/2012. CAD, Prior CABG; Risk Factors:Hypertension and                 Dyslipidemia.                 Aortic Valve: unknown valve is present  in the aortic position.                 Procedure Date: 2003.  Sonographer:    Mikki Santee RDCS (AE) Referring Phys: [id number] [place]  1. Left ventricular ejection fraction, by estimation, is 45 to 50%. The left ventricle has mildly decreased function. The left  ventricle demonstrates global hypokinesis. Left ventricular diastolic parameters are indeterminate.  2. Right ventricular systolic function is normal. The right ventricular size is normal. There is normal pulmonary artery systolic pressure.  3. The mitral valve is normal in structure. No evidence of mitral valve regurgitation.  4. The aortic valve has been repaired/replaced. Aortic valve regurgitation is not visualized. There is a unknown valve present in the aortic position. Procedure Date: 2003. Comparison(s): A prior study was performed on 06/28/2012. Stable valve parameters; decreased LVEF. FINDINGS  Left Ventricle: Left ventricular ejection fraction, by estimation, is 45 to 50%. The left ventricle has mildly decreased function. The left ventricle demonstrates global hypokinesis. The left ventricular internal cavity size was normal in size. There is  no left ventricular hypertrophy. Abnormal (paradoxical) septal motion consistent with post-operative status. Left ventricular diastolic parameters are indeterminate. Right Ventricle: The right ventricular size is normal. No increase in right ventricular wall thickness. Right ventricular systolic function is normal. There is normal pulmonary artery systolic pressure. The tricuspid regurgitant velocity is 2.01 m/s, and  with an assumed right atrial pressure of 8 mmHg, the estimated right ventricular systolic pressure is 37.8 mmHg. Left Atrium: Left atrial size was normal in size. Right Atrium: Right atrial size was normal in size. Pericardium: There is no evidence of pericardial effusion. Mitral Valve: The mitral valve is normal in structure. No evidence of mitral valve regurgitation. Tricuspid Valve: The tricuspid valve is grossly normal. Tricuspid valve regurgitation is not demonstrated. No evidence of tricuspid stenosis. Aortic Valve: DVI 0.36. The aortic valve has been repaired/replaced. Aortic valve regurgitation is not visualized. Aortic valve mean gradient  measures 7.5 mmHg. Aortic valve peak gradient measures 14.4 mmHg. Aortic valve area, by VTI measures 1.14 cm. There is a unknown valve present in the aortic position. Procedure Date: 2003. Pulmonic Valve: The pulmonic valve was not well visualized. Pulmonic valve regurgitation is not visualized. No evidence of pulmonic stenosis. Aorta: The aortic root is normal in size and structure. IAS/Shunts: The atrial septum is grossly normal.  LEFT VENTRICLE PLAX 2D LVIDd:         4.40 cm  Diastology LVIDs:         3.70 cm  LV e' medial:    7.83 cm/s LV PW:         1.00 cm  LV E/e' medial:  11.6 LV IVS:        1.00 cm  LV e' lateral:   11.10 cm/s LVOT diam:     2.00 cm  LV E/e' lateral: 8.2 LV SV:         35 LV SV Index:   20 LVOT Area:     3.14 cm  RIGHT VENTRICLE TAPSE (M-mode): 1.0 cm LEFT ATRIUM             Index       RIGHT ATRIUM           Index LA diam:        4.10 cm 2.32 cm/m  RA Area:     11.50 cm LA Vol (A2C):   36.1 ml 20.42 ml/m RA Volume:   24.60 ml  13.91 ml/m LA Vol (A4C):   40.1 ml 22.68 ml/m LA Biplane Vol: 41.0 ml 23.19 ml/m  AORTIC VALVE AV Area (Vmax):    1.10 cm AV Area (Vmean):   1.07 cm AV Area (VTI):     1.14 cm AV Vmax:           190.00 cm/s AV Vmean:          124.500 cm/s AV VTI:            0.312 m AV Peak Grad:      14.4 mmHg AV Mean Grad:      7.5 mmHg LVOT Vmax:         66.80 cm/s LVOT Vmean:        42.400 cm/s LVOT VTI:          0.113 m LVOT/AV VTI ratio: 0.36  AORTA Ao Root diam: 2.10 cm MITRAL VALVE                TRICUSPID VALVE MV Area (PHT): 3.54 cm     TR Peak grad:   16.2 mmHg MV Decel Time: 214 msec     TR Vmax:        201.00 cm/s MV E velocity: 90.70 cm/s MV A velocity: 114.00 cm/s  SHUNTS MV E/A ratio:  0.80         Systemic VTI:  0.11 m                             Systemic Diam: 2.00 cm Rudean Haskell MD Electronically signed by Rudean Haskell MD Signature Date/Time: 05/29/2020/12:47:47 PM    Final    VAS [place] LOWER EXTREMITY VENOUS (DVT)  Result Date:  05/29/2020  Lower Venous DVT Study Indications: Swelling. Other Indications: D-Dimer. Risk Factors: None identified. Limitations: Patient positioning. unable to position legs appropriately for exam. Comparison Study: No previous Performing Technologist: Vonzell Schlatter RVT  Examination Guidelines: A complete evaluation includes B-mode imaging, spectral Doppler, color Doppler, and power Doppler as needed of all accessible portions of each vessel. Bilateral testing is considered an integral part of a complete examination. Limited examinations for reoccurring indications may be performed as noted. The reflux portion of the exam is performed with the patient in reverse Trendelenburg.  +---------+---------------+---------+-----------+----------+--------------+ RIGHT    CompressibilityPhasicitySpontaneityPropertiesThrombus Aging +---------+---------------+---------+-----------+----------+--------------+ CFV      Full           Yes      Yes                                 +---------+---------------+---------+-----------+----------+--------------+ SFJ      Full                                                        +---------+---------------+---------+-----------+----------+--------------+ FV Prox  Full                                                        +---------+---------------+---------+-----------+----------+--------------+ FV Mid   Full                                                        +---------+---------------+---------+-----------+----------+--------------+  FV DistalFull                                                        +---------+---------------+---------+-----------+----------+--------------+ PFV      Full                                                        +---------+---------------+---------+-----------+----------+--------------+ POP      Full           Yes      Yes                                  +---------+---------------+---------+-----------+----------+--------------+ PTV      Full                                                        +---------+---------------+---------+-----------+----------+--------------+ PERO     Full                                                        +---------+---------------+---------+-----------+----------+--------------+   +---------+---------------+---------+-----------+----------+--------------+ LEFT     CompressibilityPhasicitySpontaneityPropertiesThrombus Aging +---------+---------------+---------+-----------+----------+--------------+ CFV      Full           Yes      Yes                                 +---------+---------------+---------+-----------+----------+--------------+ SFJ      Full                                                        +---------+---------------+---------+-----------+----------+--------------+ FV Prox  Full                                                        +---------+---------------+---------+-----------+----------+--------------+ FV Mid   Full                                                        +---------+---------------+---------+-----------+----------+--------------+ FV DistalFull                                                        +---------+---------------+---------+-----------+----------+--------------+  PFV      Full                                                        +---------+---------------+---------+-----------+----------+--------------+ POP      Full           Yes      Yes                                 +---------+---------------+---------+-----------+----------+--------------+ PTV      Full                                                        +---------+---------------+---------+-----------+----------+--------------+ PERO     Full                                                         +---------+---------------+---------+-----------+----------+--------------+  Summary: BILATERAL: - No evidence of deep vein thrombosis seen in the lower extremities, bilaterally. -No evidence of popliteal cyst, bilaterally.   *See table(s) above for measurements and observations. Electronically signed by Harold Barban MD on 05/29/2020 at 10:08:59 PM.    Final     Cardiac Studies   Echo 05/29/20: 1. Left ventricular ejection fraction, by estimation, is 45 to 50%. The  left ventricle has mildly decreased function. The left ventricle  demonstrates global hypokinesis. Left ventricular diastolic parameters are  indeterminate.  2. Right ventricular systolic function is normal. The right ventricular  size is normal. There is normal pulmonary artery systolic pressure.  3. The mitral valve is normal in structure. No evidence of mitral valve  regurgitation.  4. The aortic valve has been repaired/replaced. Aortic valve  regurgitation is not visualized. There is a unknown valve present in the  aortic position. Procedure Date: 2003.   Patient Profile     87 y.o. female with a hx of paroxysmal atrial fibrillation, CAD status post CABG, complete heart block status post AV replacement.,  Hypertension, hyperlipidemia, bioprosthetic aortic valve replacement, GERD thoracic ascending aortic aneurysm repair, carotid stenosis, and dementia who is being seen today for the evaluation of elevated troponin, acute systolic heart failure.  Assessment & Plan    # Acute systolic and diastolic heart failure:  BNP elevated to 561, LVEF newly reduced to 45-50%.  Volume status is difficult to assess, but she has no lower extremity edema and is laying flat in bed.  Her breathing seems much less labored.  Ins and outs are incompletely recorded due to incontinence.  However her weight continues to come down.  Basic metabolic panel is pending.  We will stop her IV Lasix and plan to switch to 20 mg p.o. daily.  If she is unable  to tolerate p.o. this can be 20 mg IV daily.  No plans for ischemic evaluation given her advanced dementia and transition to comfort care.  # Descending aorta aneurysm:  Massively enlarged but stable.  H/h stable on heparin.   #  Elevated troponin:  # CAD s/p CABG:  # Hyperlipidemia:  She completed 48 hours of IV heparin.  It was appropriately discontinued this morning, as were aspirin and statin.  No plan for an ischemia work up. Overall her prognosis is quite poor. Agree with comfort care/hospice.    [facility] will sign off.   Medication Recommendations:  Transition to oral lasix for comfort Other recommendations (labs, testing, etc):  none Follow up as an outpatient:  none      For questions or updates, please contact [facility] Please consult [email or web address] for contact info under        Signed, Skeet Latch, MD  05/31/2020, 8:45 AM

## 2020-05-31 NOTE — NC FL2 (Signed)
[place] MEDICAID FL2 LEVEL OF CARE SCREENING TOOL     IDENTIFICATION  Patient Name: [name] Birthdate: 06/03/1923 Sex: female Admission Date (Current Location): 05/28/2020  [facility] and [place] Number:  Herbalist and Address:  [facility] [place], [place] [address], [place], [place] [address]      Provider Number: [id number]  Attending Physician Name and Address:  Dessa Phi, DO  Relative Name and Phone Number:  Adra, Shepler [phone]  [phone]    Current Level of Care: Hospital Recommended Level of Care: Memory Care Prior Approval Number:    Date Approved/Denied:   PASRR Number:    Discharge Plan: Other (Comment) (ALF Memory care)    Current Diagnoses: Patient Active Problem List   Diagnosis Date Noted  . Acute metabolic encephalopathy 29/51/8841  . Generalized weakness 05/29/2020  . Acute-on-chronic kidney injury ([place]) 05/29/2020  . Fall 05/29/2020  . Shortness of breath   . Thoracic aortic aneurysm without rupture ([place])   . Acute combined systolic and diastolic heart failure ([place])   . Acute hypoxemic respiratory failure ([place]) 05/28/2020  . Hematuria 09/20/2018  . Bilateral leg edema 09/20/2018  . Prediabetes 09/09/2016  . CKD (chronic kidney disease) stage 3, GFR 30-59 ml/min (HCC) 09/10/2015  . Arthritis of right hip, severe 09/10/2015  . Pacemaker-St.Jude 01/13/2012  . Second degree Mobitz II AV block 01/11/2012  . CAROTID STENOSIS 11/29/2008  . Hearing loss 10/12/2008  . Osteoporosis 05/08/2008  . AORTIC VALVE REPLACEMENT, HX OF 05/08/2008  . Hyperlipidemia 04/13/2007  . Mild dementia ([place]) 04/13/2007  . Coronary atherosclerosis 04/13/2007  . Paroxysmal atrial fibrillation ([place]) 04/13/2007  . DIVERTICULOSIS OF COLON 04/13/2007  . Anxiety state 01/18/2007  . Essential hypertension 01/18/2007    Orientation RESPIRATION BLADDER Height & Weight     Self  Normal Incontinent Weight: 151 lb 7.3 oz (68.7  kg) Height:  5\' 3"  (160 cm)  BEHAVIORAL SYMPTOMS/MOOD NEUROLOGICAL BOWEL NUTRITION STATUS      Incontinent Diet (see discharge summary)  AMBULATORY STATUS COMMUNICATION OF NEEDS Skin   Total Care Verbally Normal                       Personal Care Assistance Level of Assistance  Bathing,Feeding,Dressing Bathing Assistance: Maximum assistance Feeding assistance: Maximum assistance Dressing Assistance: Maximum assistance     Functional Limitations Info  Sight,Hearing,Speech Sight Info: Adequate Hearing Info: Adequate Speech Info: Adequate    SPECIAL CARE FACTORS FREQUENCY                       Contractures Contractures Info: Not present    Additional Factors Info  Code Status,Allergies Code Status Info: DNR Allergies Info: Coumadin (Warfarin Sodium), Clarithromycin, Iohexol, Nsaids, Penicillins, Prednisone, Statins, Sulfonamide Derivatives           Current Medications (05/31/2020):  This is the current hospital active medication list Current Facility-Administered Medications  Medication Dose Route Frequency Provider Last Rate Last Admin  . acetaminophen (TYLENOL) tablet 650 mg  650 mg Oral Q6H PRN Shela Leff, MD   650 mg at 05/29/20 1648   Or  . acetaminophen (TYLENOL) suppository 650 mg  650 mg Rectal Q6H PRN Shela Leff, MD   650 mg at 05/30/20 0059  . furosemide (LASIX) tablet 20 mg  20 mg Oral Daily Dellinger, Marianne L, PA-C   20 mg at 05/31/20 1439  . haloperidol lactate (HALDOL) injection 2 mg  2 mg Intravenous  Q8H Dellinger, Bobby Rumpf, PA-C      . morphine 2 MG/ML injection 2 mg  2 mg Intravenous Q2H PRN Dellinger, Marianne L, PA-C   2 mg at 05/31/20 1438  . morphine 2 MG/ML injection 2 mg  2 mg Intravenous Q8H Dellinger, Marianne L, PA-C      . OLANZapine zydis (ZYPREXA) disintegrating tablet 5 mg  5 mg Oral Daily Dellinger, Bobby Rumpf, PA-C         Discharge Medications: Please see discharge summary for a list of discharge  medications.  Relevant Imaging Results:  Relevant Lab Results:   Additional Information SSN [id number]  Joanne Chars, LCSW

## 2020-05-31 NOTE — Progress Notes (Signed)
[facility] ([facility])  [facility] will continue to follow to see if she is eligible for residential hospice.   Currently, she is not eligible for residential hospice at [facility].   Noted full shift towards comfort and scheduled comfort medications. Noted that pt is not eating as much.   [facility] will continue to follow daily.  Thank you, Venia Carbon RN, BSN, [facility] Liaison

## 2020-05-31 NOTE — TOC Progression Note (Addendum)
Transition of Care [facility]) - Progression Note    Patient Details  Name: [name] MRN: [id number] Date of Birth: November 14, 1923  Transition of Care [facility]) CM/SW Contact  Joanne Chars, LCSW Phone Number: 05/31/2020, 8:21 AM  Clinical Narrative:   CSW spoke with Donnetta Simpers at [facility].  Pt can return if that is the decision, hospice can work with pt in memory care unit.  Waiting on [facility] decision on residential eligibility.   1420: Per [place], pt is not [employer] eligible.  Pt is home hospice eligible.  MD does not want to DC today.  CSW spoke with Donnetta Simpers at [facility] again.  Discharge over the weekend is possible and Donnetta Simpers is the contact.  [phone].  Donnetta Simpers asks that if DC is to happen over weekend, please text her as her cell service at home is spotty and a text will get through eventually.       Expected Discharge Plan: Home w Hospice Care Barriers to Discharge: Continued Medical Work up  Expected Discharge Plan and Services Expected Discharge Plan: [place] In-house Referral: Clinical Social Work   Post Acute Care Choice: Hospice Living arrangements for the past 2 months: [place] ([place])                                       Social Determinants of Health (SDOH) Interventions    Readmission Risk Interventions No flowsheet data found.

## 2020-05-31 NOTE — Progress Notes (Deleted)
Received call from RN.  Ms. Want and groaning and agitated despite PRN doses of morphine and haldol.  Will give a PRN dose of IV morphine for severe pain discomfort.  Also increase PRN haldol dose to 2 mg.    If patient does not calm after these doses may consider rotating opioid or adding low dose Zyprexa.  Florentina Jenny, PA-C Palliative Medicine Office:  [phone]  No charge.

## 2020-05-31 NOTE — Progress Notes (Signed)
PROGRESS NOTE    [name]  IDP:[id number] DOB: 1923-05-14 DOA: 05/28/2020 PCP: Binnie Rail, MD     Brief Narrative:  [name] a 87 y.o.femalewith medical history significant ofadvanced dementia, CKD stage III,paroxysmalA. fib not on anticoagulation, CAD status post CABG, carotid artery stenosis, complete heart block status post PPM 2013, hypertension, hyperlipidemia, thoracic aortic aneurysm status post repair 2013, bioprosthetic AVR in 2003presenting to the ED via EMS for evaluation of altered mental status. She was satting 88% on room air with EMS, placed on 2 L supplemental oxygen and sats improved to 96%.No history could be obtained from the patient given her advanced dementia. Daughter-in-law at bedside states patient resides at the memory care unit at [facility]. States she has advanced dementia and is disoriented at baseline. She usually responds to questions only by saying yes or no. She is normally able to ambulate with a walker but today staff at her nursing facility noticed that she was very lethargic and not able to walk. They noticed that she was having difficulty breathing, appeared pale, and her lips were purple.  Patient was admitted with working diagnosis of acute hypoxemic respiratory failure.  VTE was ruled out.  Patient had elevated troponin and cardiology was consulted.  Patient was treated with IV Lasix, IV heparin for heart failure.  Descending aorta aneurysm remained stable.  Due to patient's continued restlessness, agitation and overall poor prognosis, palliative care medicine was consulted.  Family decided to transition patient to comfort care and hospice.  New events last 24 hours / Subjective: Patient resting and sleeping this morning  Assessment & Plan:   Principal Problem:   Acute hypoxemic respiratory failure (HCC) Active Problems:   Acute metabolic encephalopathy   Generalized weakness   Acute-on-chronic kidney injury  ([place])   Fall   Shortness of breath   Thoracic aortic aneurysm without rupture (HCC)   Acute combined systolic and diastolic heart failure (HCC)    Acute hypoxemic respiratory failure Acute systolic and diastolic heart failure Elevated troponin Descending aorta aneurysm Leukocytosis -sepsis ruled out AKI on CKD stage IIIb Paroxysmal atrial fibrillation Complete heart block Advanced dementia Generalized weakness and falls Hypokalemia   Comfort care.  Appreciate palliative care medicine.  Not sure if patient has eaten at all today.  Awaiting decision from residential hospice.   Code Status: DNR Family Communication: Spoke with son over the phone today Disposition Plan:  Status is: Inpatient  Remains inpatient appropriate because:Unsafe d/c plan   Dispo: The patient is from: Memory care unit              Anticipated d/c is to: Hospice - awaiting hospice eligibility     Consultants:   Cardiology  Palliative care medicine   Antimicrobials:  Anti-infectives (From admission, onward)   Start     Dose/Rate Route Frequency Ordered Stop   05/28/20 2315  cefTRIAXone (ROCEPHIN) 1 g in sodium chloride 0.9 % 100 mL IVPB        1 g 200 mL/hr over 30 Minutes Intravenous  Once 05/28/20 2307 05/29/20 0010       Objective: Vitals:   05/30/20 2138 05/31/20 0500 05/31/20 0549 05/31/20 1304  BP: 128/65  (!) 159/92 137/83  Pulse: 71  78 79  Resp: 19  20 15   Temp: 98.3 F (36.8 C)  98.1 F (36.7 C) (!) 96.3 F (35.7 C)  TempSrc: Oral  Oral   SpO2: 96%  97% 97%  Weight:  68.7 kg  Height:        Intake/Output Summary (Last 24 hours) at 05/31/2020 1330 Last data filed at 05/31/2020 0612 Gross per 24 hour  Intake --  Output 450 ml  Net -450 ml   Filed Weights   05/29/20 0530 05/30/20 0600 05/31/20 0500  Weight: 73.5 kg 69.5 kg 68.7 kg    Examination: General exam: Appears calm and comfortable.  Sleeping   Data Reviewed: I have personally reviewed following  labs and imaging studies  CBC: Recent Labs  Lab 05/28/20 1826 05/29/20 0239 05/29/20 0245 05/30/20 0653 05/31/20 0553  WBC 16.9*  --  12.2* 10.7* 8.3  HGB 12.7 12.2 11.8* 11.5* 12.7  HCT 40.2 36.0 37.6 35.7* 40.8  MCV 88.4  --  88.9 86.4 88.7  PLT 225  --  196 190 973   Basic Metabolic Panel: Recent Labs  Lab 05/28/20 1826 05/29/20 0239 05/29/20 0245 05/30/20 0653 05/31/20 0553  NA 137 138 138 136 139  K 4.3 4.0 3.9 3.4* 4.5  CL 101  --  102 101 104  CO2 18*  --  21* 24 23  GLUCOSE 165*  --  149* 100* 96  BUN 29*  --  31* 32* 30*  CREATININE 2.06*  --  1.85* 1.67* 1.69*  CALCIUM 9.5  --  9.0 8.6* 8.8*   GFR: Estimated Creatinine Clearance: 18.9 mL/min (A) (by C-G formula based on SCr of 1.69 mg/dL (H)). Liver Function Tests: Recent Labs  Lab 05/28/20 2125  AST 31  ALT 17  ALKPHOS 88  BILITOT 1.2  PROT 7.1  ALBUMIN 3.2*   No results for input(s): LIPASE, AMYLASE in the last 168 hours. Recent Labs  Lab 05/29/20 0245  AMMONIA 15   Coagulation Profile: No results for input(s): INR, PROTIME in the last 168 hours. Cardiac Enzymes: No results for input(s): CKTOTAL, CKMB, CKMBINDEX, TROPONINI in the last 168 hours. BNP (last 3 results) No results for input(s): PROBNP in the last 8760 hours. HbA1C: No results for input(s): HGBA1C in the last 72 hours. CBG: Recent Labs  Lab 05/28/20 2003  GLUCAP 164*   Lipid Profile: No results for input(s): CHOL, HDL, LDLCALC, TRIG, CHOLHDL, LDLDIRECT in the last 72 hours. Thyroid Function Tests: Recent Labs    05/29/20 0245  TSH 2.103   Anemia Panel: Recent Labs    05/29/20 0245  VITAMINB12 168*   Sepsis Labs: Recent Labs  Lab 05/28/20 2125 05/29/20 0245  PROCALCITON  --  1.14  LATICACIDVEN 1.9 1.3    Recent Results (from the past 240 hour(s))  Culture, blood (routine x 2)     Status: None (Preliminary result)   Collection Time: 05/28/20  9:03 PM   Specimen: BLOOD  Result Value Ref Range Status    Specimen Description BLOOD SITE NOT SPECIFIED  Final   Special Requests   Final    BOTTLES DRAWN AEROBIC AND ANAEROBIC Blood Culture adequate volume   Culture   Final    NO GROWTH 2 DAYS Performed at [facility], [address], [place], [place] [address]    Report Status PENDING  Incomplete  Culture, blood (routine x 2)     Status: None (Preliminary result)   Collection Time: 05/28/20  9:14 PM   Specimen: BLOOD  Result Value Ref Range Status   Specimen Description BLOOD SITE NOT SPECIFIED  Final   Special Requests   Final    BOTTLES DRAWN AEROBIC AND ANAEROBIC Blood Culture adequate volume   Culture  Final    NO GROWTH 2 DAYS Performed at [facility], [place] [address], [place], [place] [address]    Report Status PENDING  Incomplete  Resp Panel by RT-PCR (Flu A&B, Covid) Nasopharyngeal Swab     Status: None   Collection Time: 05/28/20 11:35 PM   Specimen: Nasopharyngeal Swab; Nasopharyngeal(NP) swabs in vial transport medium  Result Value Ref Range Status   SARS Coronavirus 2 by RT PCR NEGATIVE NEGATIVE Final    Comment: (NOTE) SARS-CoV-2 target nucleic acids are NOT DETECTED.  The SARS-CoV-2 RNA is generally detectable in upper respiratory specimens during the acute phase of infection. The lowest concentration of SARS-CoV-2 viral copies this assay can detect is 138 copies/mL. A negative result does not preclude SARS-Cov-2 infection and should not be used as the sole basis for treatment or other patient management decisions. A negative result may occur with  improper specimen collection/handling, submission of specimen other than nasopharyngeal swab, presence of viral mutation(s) within the areas targeted by this assay, and inadequate number of viral copies(<138 copies/mL). A negative result must be combined with clinical observations, patient history, and epidemiological information. The expected result is Negative.  Fact Sheet for Patients:   EntrepreneurPulse.com.au  Fact Sheet for Healthcare Providers:  IncredibleEmployment.be  This test is no t yet approved or cleared by the [place] FDA and  has been authorized for detection and/or diagnosis of SARS-CoV-2 by FDA under an Emergency Use Authorization (EUA). This EUA will remain  in effect (meaning this test can be used) for the duration of the COVID-19 declaration under Section 564(b)(1) of the Act, 21 U.S.C.section 360bbb-3(b)(1), unless the authorization is terminated  or revoked sooner.       Influenza A by PCR NEGATIVE NEGATIVE Final   Influenza B by PCR NEGATIVE NEGATIVE Final    Comment: (NOTE) The Xpert Xpress SARS-CoV-2/FLU/RSV plus assay is intended as an aid in the diagnosis of influenza from Nasopharyngeal swab specimens and should not be used as a sole basis for treatment. Nasal washings and aspirates are unacceptable for Xpert Xpress SARS-CoV-2/FLU/RSV testing.  Fact Sheet for Patients: EntrepreneurPulse.com.au  Fact Sheet for Healthcare Providers: IncredibleEmployment.be  This test is not yet approved or cleared by the [place] FDA and has been authorized for detection and/or diagnosis of SARS-CoV-2 by FDA under an Emergency Use Authorization (EUA). This EUA will remain in effect (meaning this test can be used) for the duration of the COVID-19 declaration under Section 564(b)(1) of the Act, 21 U.S.C. section 360bbb-3(b)(1), unless the authorization is terminated or revoked.  Performed at [facility], [place] [address], [place], [place] [address]   Urine culture     Status: None   Collection Time: 05/29/20  4:48 AM   Specimen: Urine, Catheterized  Result Value Ref Range Status   Specimen Description URINE, CATHETERIZED  Final   Special Requests NONE  Final   Culture   Final    NO GROWTH Performed at [facility] [address], [place], [place]  [address]    Report Status 05/30/2020 FINAL  Final      Radiology Studies: DG CHEST PORT 1 VIEW  Result Date: 05/29/2020 CLINICAL DATA:  Weakness and shortness of breath EXAM: PORTABLE CHEST 1 VIEW COMPARISON:  05/28/2020 FINDINGS: Cardiac shadow remains enlarged. Pacing device and postsurgical changes are again seen. Tortuous thoracic aorta with aneurysmal dilatation is again noted similar to that seen on prior exams. No focal infiltrate or sizable effusion is seen.  No bony abnormality is noted. IMPRESSION: Chronic dilatation of the ascending aorta. No other focal abnormality is noted. Electronically Signed   By: Inez Catalina M.D.   On: 05/29/2020 19:18      Scheduled Meds: . furosemide  20 mg Oral Daily  . morphine CONCENTRATE  5 mg Sublingual Q8H  . risperiDONE  0.25 mg Oral QHS   Continuous Infusions:    LOS: 3 days      Time spent: 20 minutes   Dessa Phi, DO [facility] 05/31/2020, 1:30 PM   Available via Epic secure chat 7am-7pm After these hours, please refer to coverage provider listed on [email or web address]

## 2020-05-31 NOTE — Consult Note (Signed)
Consultation Note Date: 05/31/2020   Patient Name: [name]  DOB: 03/08/1924  MRN: [id number]  Age / Sex: 87 y.o., female  PCP: Binnie Rail, MD Referring Physician: Dessa Phi, DO  Reason for Consultation: Hospice Evaluation, Non pain symptom management, Pain control and Psychosocial/spiritual support  HPI/Patient Profile: 87 y.o. female  with past medical history of atrial fibrillation, AVR, s/p complete heart block s/p pace maker placement, osteoporosis, and dementia who was admitted on 05/28/2020 with SOB after a fall a couple of days before.  She had been eating very poorly.  Her [name] tells me that she became cyanotic at [facility] so they made the call to send her to the ER.   Clinical Assessment and Goals of Care:  I have reviewed medical records including EPIC notes, labs and imaging, received report from the care team, examined the patient and met at bedside with [name] and DIL Pamala Hurry  to discuss diagnosis prognosis, [place], EOL wishes, disposition and options.  I introduced Palliative Medicine as specialized medical care for people living with serious illness. It focuses on providing relief from the symptoms and stress of a serious illness.   We discussed a brief life review of the patient. [name] and Pamala Hurry did their best to keep her home with them for many years and when they finally had to place her 2 years ago they were very sad.  As far as functional and nutritional status prior to this illness she was sleeping most of the day and eating poorly.  [name] and Pamala Hurry talk about how she used to love to go for rides in the car with them and walk with her walker in months gone by.  Now [name] says she no longer speaks to them - she only moans.  She can stay awake for about 30 minutes at a time but will drift off back to sleep.  We discussed her current illness and what it means in  the larger context of her on-going co-morbidities.  Natural disease trajectory and expectations at EOL were discussed.  I attempted to elicit values and goals of care important to the patient.  [name] mentions that when her husband died Bo wanted die on the same day.  She is a strong Christian and is prepared and willing to meet the [place] when her time comes. We completed a MOST form together. The patient and family outlined their wishes for the following treatment decisions:  Cardiopulmonary Resuscitation: Do Not Attempt Resuscitation (DNR/No CPR)  Medical Interventions: Comfort Measures: Keep clean, warm, and dry. Use medication by any route, positioning, wound care, and other measures to relieve pain and suffering. Use oxygen, suction and manual treatment of airway obstruction as needed for comfort. Do not transfer to the hospital unless comfort needs cannot be met in current location.  Antibiotics: Determine use of limitation of antibiotics when infection occurs  IV Fluids: No IV fluids (provide other measures to ensure comfort)  Feeding Tube: No feeding tube    Hospice and Palliative Care  services outpatient were explained and offered.  Medical recommendation and family's preference is for a [place] placement.  Questions and concerns were addressed.  The family was encouraged to call with questions or concerns.    Primary Decision Maker:  Dalbert Batman and Jori Moll.   Sons are Co-HCPOA    SUMMARY OF RECOMMENDATIONS    Discussed with Dr. Maylene Roes and bedside RN. Recommend assessment over the weekend.  Feel strongly that patient is appropriate for Hospice facility as she is no longer eating, has worsening heart failure,  family is desirous of comfort measures and she is requiring round the clock symptom management  Code Status/Advance Care Planning:  DNR   Symptom Management:   Patient has been experiencing agitation and pain despite previously ordered comfort  medications  Will order scheduled morphine q 8 hours and scheduled haldol q 8 hours alternating.  Patient will also have PRN IV morphine.  Will give Zyprexa 5 mg daily to help with agitation.  Suppository x 1 and then PRN for mild constipation.  Will reassess 3/12.  Additional Recommendations (Limitations, Scope, Preferences):  Full Comfort Care  Palliative Prophylaxis:   frequent assessment for agitation, pain or dyspnea.  Psycho-social/Spiritual:   Desire for further Chaplaincy support:  Prognosis:  2 weeks or less given patient is bedbound, not eating, worsening heart failure and comfort measures only.    Discharge Planning: Hospice facility      Primary Diagnoses: Present on Admission: . Acute hypoxemic respiratory failure ([place])   I have reviewed the medical record, interviewed the patient and family, and examined the patient. The following aspects are pertinent.  Past Medical History:  Diagnosis Date  . Anemia, unspecified   . Anxiety state, unspecified   . Atrial fibrillation (HCC)    not anticoagulation candidate due to falls  . CAD (coronary artery disease)    s/p CABG  . Carotid artery stenosis   . Complete heart block ([place]) 12/2011   s/p St.Jude dual chamber PPM 01/12/12  . Dementia ([place])   . Dementia ([place]) 05/09/2017  . Diverticulosis of colon (without mention of hemorrhage)   . Esophageal reflux   . HTN (hypertension)   . Hyperlipidemia   . Osteoarthrosis, unspecified whether generalized or localized, unspecified site   . Osteoporosis, unspecified   . Pacemaker-St.Jude 01/13/2012  . S/P AVR (aortic valve replacement) 2003   bioprosthetic   . Thoracic aortic aneurysm ([place]) 2003   s/p repair  . Unspecified chronic bronchitis (HCC)    Social History   Socioeconomic History  . Marital status: Married    Spouse name: [name]  . Number of children: Not on file  . Years of education: Not on file  . Highest education level: Not on file   Occupational History  . Occupation: retired  Tobacco Use  . Smoking status: Never Smoker  . Smokeless tobacco: Never Used  Vaping Use  . Vaping Use: Never used  Substance and Sexual Activity  . Alcohol use: No  . Drug use: No  . Sexual activity: Not on file  Other Topics Concern  . Not on file  Social History Narrative   Lives with husband. Mother had kidney problems.  Father had coronary artery  disease.  Both died when the patient was young.  She does not know how old  they were.  She has one brother who died at age 87 of a CVA, although, he  had coronary artery disease and diabetes.  Another brother died at age 72 of  a stroke.  He also had coronary artery disease.  One sister is living, she  has coronary artery disease and diabetes and one sister has anginal symptoms. Her sister has a pacemaker (history palpitations).          Social Determinants of Health   Financial Resource Strain: Not on file  Food Insecurity: Not on file  Transportation Needs: Not on file  Physical Activity: Not on file  Stress: Not on file  Social Connections: Not on file   Family History  Problem Relation Age of Onset  . Diabetes Brother   . Diabetes Sister     Allergies  Allergen Reactions  . Coumadin [Warfarin Sodium] Other (See Comments)    hallucinations   . Clarithromycin Nausea Only and Other (See Comments)  . Iohexol      Desc: sob with ivp approx 20 yrs ago, pt was premedicated with 13hr prep with no problems kdean, Onset Date: [id number]   . Nsaids Nausea Only  . Penicillins Other (See Comments)    Abdominal pain  . Prednisone Nausea Only and Swelling  . Statins Other (See Comments)    REACTION: abd pain and dark stools  . Sulfonamide Derivatives Nausea Only and Other (See Comments)    Abdominal pain      Vital Signs: BP 137/83 (BP Location: Right Arm)   Pulse 79   Temp (!) 96.3 F (35.7 C)   Resp 15   Ht $R'5\' 3"'Zh$  (1.6 m)   Wt 68.7 kg   SpO2 97%   BMI 26.83 kg/m  Pain  Scale: PAINAD   Pain Score: Asleep   SpO2: SpO2: 97 % O2 Device:SpO2: 97 % O2 Flow Rate: .O2 Flow Rate (L/min): 2 L/min    Palliative Assessment/Data: 20%     Time In: 3:00 pm Time Out: 4:10 pm Time Total: 70 min. Visit consisted of counseling and education dealing with the complex and emotionally intense issues surrounding the need for palliative care and symptom management in the setting of serious and potentially life-threatening illness. Greater than 50%  of this time was spent counseling and coordinating care related to the above assessment and plan.  Signed by: Florentina Jenny, PA-C Palliative Medicine  Please contact Palliative Medicine Team phone at [phone] for questions and concerns.  For individual provider: See Shea Evans

## 2020-06-01 DIAGNOSIS — F0390 Unspecified dementia without behavioral disturbance: Secondary | ICD-10-CM

## 2020-06-01 DIAGNOSIS — Z515 Encounter for palliative care: Secondary | ICD-10-CM

## 2020-06-01 NOTE — Progress Notes (Signed)
PROGRESS NOTE    [name]  CNO:[id number] DOB: August 23, 1923 DOA: 05/28/2020 PCP: Binnie Rail, MD     Brief Narrative:  [name] a 87 y.o.femalewith medical history significant ofadvanced dementia, CKD stage III,paroxysmalA. fib not on anticoagulation, CAD status post CABG, carotid artery stenosis, complete heart block status post PPM 2013, hypertension, hyperlipidemia, thoracic aortic aneurysm status post repair 2013, bioprosthetic AVR in 2003presenting to the ED via EMS for evaluation of altered mental status. She was satting 88% on room air with EMS, placed on 2 L supplemental oxygen and sats improved to 96%.No history could be obtained from the patient given her advanced dementia. Daughter-in-law at bedside states patient resides at the memory care unit at [place]. States she has advanced dementia and is disoriented at baseline. She usually responds to questions only by saying yes or no. She is normally able to ambulate with a walker but today staff at her nursing facility noticed that she was very lethargic and not able to walk. They noticed that she was having difficulty breathing, appeared pale, and her lips were purple.  Patient was admitted with working diagnosis of acute hypoxemic respiratory failure.  VTE was ruled out.  Patient had elevated troponin and cardiology was consulted.  Patient was treated with IV Lasix, IV heparin for heart failure.  Descending aorta aneurysm remained stable.  Due to patient's continued restlessness, agitation and overall poor prognosis, palliative care medicine was consulted.  Family decided to transition patient to comfort care and hospice.  New events last 24 hours / Subjective: Patient resting comfortably this morning.   Assessment & Plan:   Principal Problem:   Acute hypoxemic respiratory failure (HCC) Active Problems:   Acute metabolic encephalopathy   Generalized weakness   Acute-on-chronic kidney injury  ([place])   Fall   Shortness of breath   Thoracic aortic aneurysm without rupture (HCC)   Acute combined systolic and diastolic heart failure (HCC)    Acute hypoxemic respiratory failure Acute systolic and diastolic heart failure Elevated troponin Descending aorta aneurysm Leukocytosis -sepsis ruled out AKI on CKD stage IIIb Paroxysmal atrial fibrillation Complete heart block Advanced dementia Generalized weakness and falls Hypokalemia   Comfort care.  Appreciate palliative care medicine.  Patient seems appropriate for residential hospice, <2 weeks prognosis.    Code Status: DNR Disposition Plan:  Status is: Inpatient  Remains inpatient appropriate because:Unsafe d/c plan   Dispo: The patient is from: Memory care unit              Anticipated d/c is to: Hospice     Consultants:   Cardiology  Palliative care medicine   Antimicrobials:  Anti-infectives (From admission, onward)   Start     Dose/Rate Route Frequency Ordered Stop   05/28/20 2315  cefTRIAXone (ROCEPHIN) 1 g in sodium chloride 0.9 % 100 mL IVPB        1 g 200 mL/hr over 30 Minutes Intravenous  Once 05/28/20 2307 05/29/20 0010       Objective: Vitals:   05/31/20 0549 05/31/20 1304 05/31/20 2215 06/01/20 0618  BP: (!) 159/92 137/83 115/76 140/63  Pulse: 78 79 86 74  Resp: 20 15 16 20   Temp: 98.1 F (36.7 C) (!) 96.3 F (35.7 C) 98.1 F (36.7 C) 98.6 F (37 C)  TempSrc: Oral  Oral Oral  SpO2: 97% 97% 96% 97%  Weight:      Height:       No intake or output data in the  24 hours ending 06/01/20 0947 Filed Weights   05/29/20 0530 05/30/20 0600 05/31/20 0500  Weight: 73.5 kg 69.5 kg 68.7 kg    Examination: General exam: Appears calm and comfortable.  Sleeping   Data Reviewed: I have personally reviewed following labs and imaging studies  CBC: Recent Labs  Lab 05/28/20 1826 05/29/20 0239 05/29/20 0245 05/30/20 0653 05/31/20 0553  WBC 16.9*  --  12.2* 10.7* 8.3  HGB 12.7 12.2  11.8* 11.5* 12.7  HCT 40.2 36.0 37.6 35.7* 40.8  MCV 88.4  --  88.9 86.4 88.7  PLT 225  --  196 190 161   Basic Metabolic Panel: Recent Labs  Lab 05/28/20 1826 05/29/20 0239 05/29/20 0245 05/30/20 0653 05/31/20 0553  NA 137 138 138 136 139  K 4.3 4.0 3.9 3.4* 4.5  CL 101  --  102 101 104  CO2 18*  --  21* 24 23  GLUCOSE 165*  --  149* 100* 96  BUN 29*  --  31* 32* 30*  CREATININE 2.06*  --  1.85* 1.67* 1.69*  CALCIUM 9.5  --  9.0 8.6* 8.8*   GFR: Estimated Creatinine Clearance: 18.9 mL/min (A) (by C-G formula based on SCr of 1.69 mg/dL (H)). Liver Function Tests: Recent Labs  Lab 05/28/20 2125  AST 31  ALT 17  ALKPHOS 88  BILITOT 1.2  PROT 7.1  ALBUMIN 3.2*   No results for input(s): LIPASE, AMYLASE in the last 168 hours. Recent Labs  Lab 05/29/20 0245  AMMONIA 15   Coagulation Profile: No results for input(s): INR, PROTIME in the last 168 hours. Cardiac Enzymes: No results for input(s): CKTOTAL, CKMB, CKMBINDEX, TROPONINI in the last 168 hours. BNP (last 3 results) No results for input(s): PROBNP in the last 8760 hours. HbA1C: No results for input(s): HGBA1C in the last 72 hours. CBG: Recent Labs  Lab 05/28/20 2003  GLUCAP 164*   Lipid Profile: No results for input(s): CHOL, HDL, LDLCALC, TRIG, CHOLHDL, LDLDIRECT in the last 72 hours. Thyroid Function Tests: No results for input(s): TSH, T4TOTAL, FREET4, T3FREE, THYROIDAB in the last 72 hours. Anemia Panel: No results for input(s): VITAMINB12, FOLATE, FERRITIN, TIBC, IRON, RETICCTPCT in the last 72 hours. Sepsis Labs: Recent Labs  Lab 05/28/20 2125 05/29/20 0245  PROCALCITON  --  1.14  LATICACIDVEN 1.9 1.3    Recent Results (from the past 240 hour(s))  Culture, blood (routine x 2)     Status: None (Preliminary result)   Collection Time: 05/28/20  9:03 PM   Specimen: BLOOD  Result Value Ref Range Status   Specimen Description BLOOD SITE NOT SPECIFIED  Final   Special Requests   Final     BOTTLES DRAWN AEROBIC AND ANAEROBIC Blood Culture adequate volume   Culture   Final    NO GROWTH 3 DAYS Performed at [facility], [address] [place], [place] [address]    Report Status PENDING  Incomplete  Culture, blood (routine x 2)     Status: None (Preliminary result)   Collection Time: 05/28/20  9:14 PM   Specimen: BLOOD  Result Value Ref Range Status   Specimen Description BLOOD SITE NOT SPECIFIED  Final   Special Requests   Final    BOTTLES DRAWN AEROBIC AND ANAEROBIC Blood Culture adequate volume   Culture   Final    NO GROWTH 3 DAYS Performed at [facility], [address], [place], [place] [address]    Report Status PENDING  Incomplete  Resp Panel by RT-PCR (Flu A&B, Covid) Nasopharyngeal Swab     Status: None   Collection Time: 05/28/20 11:35 PM   Specimen: Nasopharyngeal Swab; Nasopharyngeal(NP) swabs in vial transport medium  Result Value Ref Range Status   SARS Coronavirus 2 by RT PCR NEGATIVE NEGATIVE Final    Comment: (NOTE) SARS-CoV-2 target nucleic acids are NOT DETECTED.  The SARS-CoV-2 RNA is generally detectable in upper respiratory specimens during the acute phase of infection. The lowest concentration of SARS-CoV-2 viral copies this assay can detect is 138 copies/mL. A negative result does not preclude SARS-Cov-2 infection and should not be used as the sole basis for treatment or other patient management decisions. A negative result may occur with  improper specimen collection/handling, submission of specimen other than nasopharyngeal swab, presence of viral mutation(s) within the areas targeted by this assay, and inadequate number of viral copies(<138 copies/mL). A negative result must be combined with clinical observations, patient history, and epidemiological information. The expected result is Negative.  Fact Sheet for Patients:  EntrepreneurPulse.com.au  Fact Sheet for Healthcare Providers:   IncredibleEmployment.be  This test is no t yet approved or cleared by the [place] FDA and  has been authorized for detection and/or diagnosis of SARS-CoV-2 by FDA under an Emergency Use Authorization (EUA). This EUA will remain  in effect (meaning this test can be used) for the duration of the COVID-19 declaration under Section 564(b)(1) of the Act, 21 U.S.C.section 360bbb-3(b)(1), unless the authorization is terminated  or revoked sooner.       Influenza A by PCR NEGATIVE NEGATIVE Final   Influenza B by PCR NEGATIVE NEGATIVE Final    Comment: (NOTE) The Xpert Xpress SARS-CoV-2/FLU/RSV plus assay is intended as an aid in the diagnosis of influenza from Nasopharyngeal swab specimens and should not be used as a sole basis for treatment. Nasal washings and aspirates are unacceptable for Xpert Xpress SARS-CoV-2/FLU/RSV testing.  Fact Sheet for Patients: EntrepreneurPulse.com.au  Fact Sheet for Healthcare Providers: IncredibleEmployment.be  This test is not yet approved or cleared by the [place] FDA and has been authorized for detection and/or diagnosis of SARS-CoV-2 by FDA under an Emergency Use Authorization (EUA). This EUA will remain in effect (meaning this test can be used) for the duration of the COVID-19 declaration under Section 564(b)(1) of the Act, 21 U.S.C. section 360bbb-3(b)(1), unless the authorization is terminated or revoked.  Performed at [facility], [place] [address] [place], [place] [address]   Urine culture     Status: None   Collection Time: 05/29/20  4:48 AM   Specimen: Urine, Catheterized  Result Value Ref Range Status   Specimen Description URINE, CATHETERIZED  Final   Special Requests NONE  Final   Culture   Final    NO GROWTH Performed at [facility] [address] [place], [place] [address]    Report Status 05/30/2020 FINAL  Final      Radiology Studies: No  results found.    Scheduled Meds: . furosemide  20 mg Oral Daily  . haloperidol lactate  2 mg Intravenous Q8H  .  morphine injection  2 mg Intravenous Q8H  . OLANZapine zydis  5 mg Oral Daily  . sodium chloride flush  3 mL Intravenous Q12H   Continuous Infusions: . sodium chloride       LOS: 4 days      Time spent: 15 minutes   Dessa Phi, DO [facility] 06/01/2020, 9:47 AM   Available via  Epic secure chat 7am-7pm After these hours, please refer to coverage provider listed on [email or web address]

## 2020-06-01 NOTE — Progress Notes (Signed)
Daily Progress Note   Patient Name: ANINA SCHNAKE       Date: 06/01/2020 DOB: [birth year]  Age: 87 y.o. MRN#: [id number] Attending Physician: Dessa Phi, DO Primary Care Physician: Binnie Rail, MD Admit Date: 05/28/2020  Reason for Consultation/Follow-up: To discuss complex medical decision making related to patient's goals of care  Discussed with night and day RNs.  Patient required a few PRN doses last night in addition to scheduled doses of morphine and haldol.  Subjective: Patient does not speak but will open her eyes when I examine her.  She is not interested in drinking when I hold a straw to her lips and encourage her to drink.   She appears comfortable - breathing steady, CV regular, no furrowed brow, no gripped fists.   Assessment: Patient with advanced heart disease and end stage dementia.  No longer speaking or eating / drinking.   Patient Profile/HPI:  87 y.o. female  with past medical history of atrial fibrillation, AVR, s/p complete heart block s/p pace maker placement, osteoporosis, and dementia who was admitted on 05/28/2020 with SOB after a fall a couple of days before.  She had been eating very poorly.  Her [name] tells me that she became cyanotic at [facility] so they made the call to send her to the ER.   Length of Stay: 4   Vital Signs: BP 140/63 (BP Location: Left Arm)   Pulse 74   Temp 98.6 F (37 C) (Oral)   Resp 20   Ht 5\' 3"  (1.6 m)   Wt 68.7 kg   SpO2 97%   BMI 26.83 kg/m  SpO2: SpO2: 97 % O2 Device: O2 Device: Room Air O2 Flow Rate: O2 Flow Rate (L/min): 2 L/min       Palliative Assessment/Data: 10%     Palliative Care Plan    Recommendations/Plan:  Continue current care.  Family's preference is that she be cared for at [facility] as  they are in [place].  Discussed case with RN  Hospice will evaluate over the weekend/Monday.  Code Status:  DNR  Prognosis:   < 2 weeks   Discharge Planning:  Hospice facility  Care plan was discussed with RN  Thank you for allowing the Palliative Medicine Team to assist in the care of this patient.  Total time  spent:  25 min.     Greater than 50%  of this time was spent counseling and coordinating care related to the above assessment and plan.  Florentina Jenny, PA-C Palliative Medicine  Please contact Palliative MedicineTeam phone at [phone] for questions and concerns between 7 am - 7 pm.   Please see AMION for individual provider pager numbers.

## 2020-06-02 DIAGNOSIS — Z515 Encounter for palliative care: Secondary | ICD-10-CM

## 2020-06-02 LAB — CULTURE, BLOOD (ROUTINE X 2)
Culture: NO GROWTH
Culture: NO GROWTH
Special Requests: ADEQUATE
Special Requests: ADEQUATE

## 2020-06-02 MED ORDER — HALOPERIDOL LACTATE 5 MG/ML IJ SOLN
1.0000 mg | Freq: Three times a day (TID) | INTRAMUSCULAR | Status: DC
  Administered 2020-06-02 – 2020-06-04 (×5): 1 mg via INTRAVENOUS
  Filled 2020-06-02 (×5): qty 1

## 2020-06-02 NOTE — Progress Notes (Addendum)
Daily Progress Note   Patient Name: [name]       Date: 06/02/2020 DOB: [birth year]  Age: 87 y.o. MRN#: [id number] Attending Physician: Dessa Phi, DO Primary Care Physician: Binnie Rail, MD Admit Date: 05/28/2020  Reason for Consultation/Follow-up:  To discuss complex medical decision making related to patient's goals of care  Per overnight RN patient had a much easier night.  Rested fairly well required 1 dose of PRN haldol in addition to scheduled dosing.  Subjective: Patient opens eyes to voice but she does not seem to see.  She is non-verbal.  No longer moaning.  Appears comfortable. No urine in purewick container (182 ml on bladder scan yesterday) bed linens are dry.  Breakfast tray is untouched.   Assessment: Calm, comfortable, not speaking, eating or drinking, unable to lift her head.  Doing well with currently scheduled IV morphine and haldol.  Requiring some PRNs.  On olanzapine which seems to be helping.  Patient with advanced heart disease and end stage dementia.    Patient Profile/HPI:  87 y.o.femalewith past medical history of atrial fibrillation, AVR, s/p complete heart block s/p pace maker placement, osteoporosis, and dementiawho was admitted on 3/8/2022with SOB after a fall a couple of days before. She had been eating very poorly. Her son [name] tells me that she became cyanotic at [facility] so they made the call to send her to the ER.  Length of Stay: 5   Vital Signs: BP 140/63 (BP Location: Left Arm)   Pulse 74   Temp 98.6 F (37 C) (Oral)   Resp 20   Ht 5\' 3"  (1.6 m)   Wt 68.7 kg   SpO2 97%   BMI 26.83 kg/m  SpO2: SpO2: 97 % O2 Device: O2 Device: Room Air O2 Flow Rate: O2 Flow Rate (L/min): 2 L/min       Palliative Assessment/Data:  10%     Palliative Care Plan    Recommendations/Plan:  Continue comfort care.  Hopeful for hospice facility placement.  Hospice to re-eval on Monday.  Code Status:  DNR   Called son [name] to provide update.  Prognosis:   < 2 weeks   Discharge Planning:  Hospice facility    Thank you for allowing the Palliative Medicine Team to assist in the care of this patient.  Total  time spent:   25 min     Greater than 50%  of this time was spent counseling and coordinating care related to the above assessment and plan.  Florentina Jenny, PA-C Palliative Medicine  Please contact Palliative MedicineTeam phone at [phone] for questions and concerns between 7 am - 7 pm.   Please see AMION for individual provider pager numbers.

## 2020-06-02 NOTE — Progress Notes (Signed)
Manufacturing engineer [facility])  Report received from RN, visited pt in the room.  Her eyes are open and she answers every question with "ok".   She appears to be well cared for and comfortable.  Currently, we do not have an open bed for her today and she has not been determined to be eligible for residential hospice from our attending.  [facility] will assess tomorrow.  Thank you, Venia Carbon RN, BSN, [facility] liaison

## 2020-06-02 NOTE — Progress Notes (Signed)
PROGRESS NOTE    [name]  UUV:[id number] DOB: 10/31/1923 DOA: 05/28/2020 PCP: Binnie Rail, MD     Brief Narrative:  [name] a 87 y.o.femalewith medical history significant ofadvanced dementia, CKD stage III,paroxysmalA. fib not on anticoagulation, CAD status post CABG, carotid artery stenosis, complete heart block status post PPM 2013, hypertension, hyperlipidemia, thoracic aortic aneurysm status post repair 2013, bioprosthetic AVR in 2003presenting to the ED via EMS for evaluation of altered mental status. She was satting 88% on room air with EMS, placed on 2 L supplemental oxygen and sats improved to 96%.No history could be obtained from the patient given her advanced dementia. Daughter-in-law at bedside states patient resides at the memory care unit at [facility]. States she has advanced dementia and is disoriented at baseline. She usually responds to questions only by saying yes or no. She is normally able to ambulate with a walker but today staff at her nursing facility noticed that she was very lethargic and not able to walk. They noticed that she was having difficulty breathing, appeared pale, and her lips were purple.  Patient was admitted with working diagnosis of acute hypoxemic respiratory failure.  VTE was ruled out.  Patient had elevated troponin and cardiology was consulted.  Patient was treated with IV Lasix, IV heparin for heart failure.  Descending aorta aneurysm remained stable.  Due to patient's continued restlessness, agitation and overall poor prognosis, palliative care medicine was consulted.  Family decided to transition patient to comfort care and hospice.  New events last 24 hours / Subjective: Sleeping comfortably in bed   Assessment & Plan:   Principal Problem:   Acute hypoxemic respiratory failure (HCC) Active Problems:   Acute metabolic encephalopathy   Generalized weakness   Acute-on-chronic kidney injury ([place])   Fall    Shortness of breath   Thoracic aortic aneurysm without rupture (HCC)   Acute combined systolic and diastolic heart failure (HCC)    Acute hypoxemic respiratory failure Acute systolic and diastolic heart failure Elevated troponin Descending aorta aneurysm Leukocytosis -sepsis ruled out AKI on CKD stage IIIb Paroxysmal atrial fibrillation Complete heart block Advanced dementia Generalized weakness and falls Hypokalemia   Comfort care.  Appreciate palliative care medicine.  Patient seems appropriate for residential hospice, <2 weeks prognosis.    Code Status: DNR Disposition Plan:  Status is: Inpatient  Remains inpatient appropriate because:Unsafe d/c plan   Dispo: The patient is from: Memory care unit              Anticipated d/c is to: Hospice     Consultants:   Cardiology  Palliative care medicine   Antimicrobials:  Anti-infectives (From admission, onward)   Start     Dose/Rate Route Frequency Ordered Stop   05/28/20 2315  cefTRIAXone (ROCEPHIN) 1 g in sodium chloride 0.9 % 100 mL IVPB        1 g 200 mL/hr over 30 Minutes Intravenous  Once 05/28/20 2307 05/29/20 0010       Objective: Vitals:   05/31/20 0549 05/31/20 1304 05/31/20 2215 06/01/20 0618  BP: (!) 159/92 137/83 115/76 140/63  Pulse: 78 79 86 74  Resp: 20 15 16 20   Temp: 98.1 F (36.7 C) (!) 96.3 F (35.7 C) 98.1 F (36.7 C) 98.6 F (37 C)  TempSrc: Oral  Oral Oral  SpO2: 97% 97% 96% 97%  Weight:      Height:        Intake/Output Summary (Last 24 hours) at 06/02/2020  Cecilia filed at 06/01/2020 1845 Gross per 24 hour  Intake 360 ml  Output -  Net 360 ml   Filed Weights   05/29/20 0530 05/30/20 0600 05/31/20 0500  Weight: 73.5 kg 69.5 kg 68.7 kg    Examination: General exam: Appears calm and comfortable    Data Reviewed: I have personally reviewed following labs and imaging studies  CBC: Recent Labs  Lab 05/28/20 1826 05/29/20 0239 05/29/20 0245 05/30/20 0653  05/31/20 0553  WBC 16.9*  --  12.2* 10.7* 8.3  HGB 12.7 12.2 11.8* 11.5* 12.7  HCT 40.2 36.0 37.6 35.7* 40.8  MCV 88.4  --  88.9 86.4 88.7  PLT 225  --  196 190 161   Basic Metabolic Panel: Recent Labs  Lab 05/28/20 1826 05/29/20 0239 05/29/20 0245 05/30/20 0653 05/31/20 0553  NA 137 138 138 136 139  K 4.3 4.0 3.9 3.4* 4.5  CL 101  --  102 101 104  CO2 18*  --  21* 24 23  GLUCOSE 165*  --  149* 100* 96  BUN 29*  --  31* 32* 30*  CREATININE 2.06*  --  1.85* 1.67* 1.69*  CALCIUM 9.5  --  9.0 8.6* 8.8*   GFR: Estimated Creatinine Clearance: 18.9 mL/min (A) (by C-G formula based on SCr of 1.69 mg/dL (H)). Liver Function Tests: Recent Labs  Lab 05/28/20 2125  AST 31  ALT 17  ALKPHOS 88  BILITOT 1.2  PROT 7.1  ALBUMIN 3.2*   No results for input(s): LIPASE, AMYLASE in the last 168 hours. Recent Labs  Lab 05/29/20 0245  AMMONIA 15   Coagulation Profile: No results for input(s): INR, PROTIME in the last 168 hours. Cardiac Enzymes: No results for input(s): CKTOTAL, CKMB, CKMBINDEX, TROPONINI in the last 168 hours. BNP (last 3 results) No results for input(s): PROBNP in the last 8760 hours. HbA1C: No results for input(s): HGBA1C in the last 72 hours. CBG: Recent Labs  Lab 05/28/20 2003  GLUCAP 164*   Lipid Profile: No results for input(s): CHOL, HDL, LDLCALC, TRIG, CHOLHDL, LDLDIRECT in the last 72 hours. Thyroid Function Tests: No results for input(s): TSH, T4TOTAL, FREET4, T3FREE, THYROIDAB in the last 72 hours. Anemia Panel: No results for input(s): VITAMINB12, FOLATE, FERRITIN, TIBC, IRON, RETICCTPCT in the last 72 hours. Sepsis Labs: Recent Labs  Lab 05/28/20 2125 05/29/20 0245  PROCALCITON  --  1.14  LATICACIDVEN 1.9 1.3    Recent Results (from the past 240 hour(s))  Culture, blood (routine x 2)     Status: None (Preliminary result)   Collection Time: 05/28/20  9:03 PM   Specimen: BLOOD  Result Value Ref Range Status   Specimen Description  BLOOD SITE NOT SPECIFIED  Final   Special Requests   Final    BOTTLES DRAWN AEROBIC AND ANAEROBIC Blood Culture adequate volume   Culture   Final    NO GROWTH 4 DAYS Performed at [facility], [address] [place], [place] [address]    Report Status PENDING  Incomplete  Culture, blood (routine x 2)     Status: None (Preliminary result)   Collection Time: 05/28/20  9:14 PM   Specimen: BLOOD  Result Value Ref Range Status   Specimen Description BLOOD SITE NOT SPECIFIED  Final   Special Requests   Final    BOTTLES DRAWN AEROBIC AND ANAEROBIC Blood Culture adequate volume   Culture   Final    NO GROWTH 4 DAYS Performed at [facility], [place] [address] [place], [place] [address]    Report Status PENDING  Incomplete  Resp Panel by RT-PCR (Flu A&B, Covid) Nasopharyngeal Swab     Status: None   Collection Time: 05/28/20 11:35 PM   Specimen: Nasopharyngeal Swab; Nasopharyngeal(NP) swabs in vial transport medium  Result Value Ref Range Status   SARS Coronavirus 2 by RT PCR NEGATIVE NEGATIVE Final    Comment: (NOTE) SARS-CoV-2 target nucleic acids are NOT DETECTED.  The SARS-CoV-2 RNA is generally detectable in upper respiratory specimens during the acute phase of infection. The lowest concentration of SARS-CoV-2 viral copies this assay can detect is 138 copies/mL. A negative result does not preclude SARS-Cov-2 infection and should not be used as the sole basis for treatment or other patient management decisions. A negative result may occur with  improper specimen collection/handling, submission of specimen other than nasopharyngeal swab, presence of viral mutation(s) within the areas targeted by this assay, and inadequate number of viral copies(<138 copies/mL). A negative result must be combined with clinical observations, patient history, and epidemiological information. The expected result is Negative.  Fact Sheet for Patients:   EntrepreneurPulse.com.au  Fact Sheet for Healthcare Providers:  IncredibleEmployment.be  This test is no t yet approved or cleared by the [place] FDA and  has been authorized for detection and/or diagnosis of SARS-CoV-2 by FDA under an Emergency Use Authorization (EUA). This EUA will remain  in effect (meaning this test can be used) for the duration of the COVID-19 declaration under Section 564(b)(1) of the Act, 21 U.S.C.section 360bbb-3(b)(1), unless the authorization is terminated  or revoked sooner.       Influenza A by PCR NEGATIVE NEGATIVE Final   Influenza B by PCR NEGATIVE NEGATIVE Final    Comment: (NOTE) The Xpert Xpress SARS-CoV-2/FLU/RSV plus assay is intended as an aid in the diagnosis of influenza from Nasopharyngeal swab specimens and should not be used as a sole basis for treatment. Nasal washings and aspirates are unacceptable for Xpert Xpress SARS-CoV-2/FLU/RSV testing.  Fact Sheet for Patients: EntrepreneurPulse.com.au  Fact Sheet for Healthcare Providers: IncredibleEmployment.be  This test is not yet approved or cleared by the [place] FDA and has been authorized for detection and/or diagnosis of SARS-CoV-2 by FDA under an Emergency Use Authorization (EUA). This EUA will remain in effect (meaning this test can be used) for the duration of the COVID-19 declaration under Section 564(b)(1) of the Act, 21 U.S.C. section 360bbb-3(b)(1), unless the authorization is terminated or revoked.  Performed at [facility], [place] [address] [place], [place] [address]   Urine culture     Status: None   Collection Time: 05/29/20  4:48 AM   Specimen: Urine, Catheterized  Result Value Ref Range Status   Specimen Description URINE, CATHETERIZED  Final   Special Requests NONE  Final   Culture   Final    NO GROWTH Performed at [facility] [address], [place], [place]  [address]    Report Status 05/30/2020 FINAL  Final      Radiology Studies: No results found.    Scheduled Meds: . haloperidol lactate  2 mg Intravenous Q8H  .  morphine injection  2 mg Intravenous Q8H  . OLANZapine zydis  5 mg Oral Daily  . sodium chloride flush  3 mL Intravenous Q12H   Continuous Infusions: . sodium chloride       LOS: 5 days      Time spent: 15 minutes   Anderson Malta  Maylene Roes, DO [facility] 06/02/2020, 9:31 AM   Available via Epic secure chat 7am-7pm After these hours, please refer to coverage provider listed on [email or web address]

## 2020-06-03 DIAGNOSIS — R778 Other specified abnormalities of plasma proteins: Secondary | ICD-10-CM

## 2020-06-03 DIAGNOSIS — Z66 Do not resuscitate: Secondary | ICD-10-CM

## 2020-06-03 DIAGNOSIS — R7989 Other specified abnormal findings of blood chemistry: Secondary | ICD-10-CM

## 2020-06-03 DIAGNOSIS — D72829 Elevated white blood cell count, unspecified: Secondary | ICD-10-CM

## 2020-06-03 NOTE — Care Management Important Message (Signed)
Important Message  Patient Details  Name: [name] MRN: [id number] Date of Birth: 1923-08-12   Medicare Important Message Given:  Yes     Cas Tracz [address] 06/03/2020, 4:10 PM

## 2020-06-03 NOTE — Progress Notes (Signed)
Daily Progress Note   Patient Name: [name]       Date: 06/03/2020 DOB: 26-Oct-1923  Age: 87 y.o. MRN#: [id number] Attending Physician: Dessa Phi, DO Primary Care Physician: Binnie Rail, MD Admit Date: 05/28/2020  Reason for Consultation/Follow-up: To discuss complex medical decision making related to patient's goals of care  Subjective: [name] is no longer moaning out loud.  She seems to feel better with the alternating IV morphine and IV haldol in combination with the nightly Olanzapine.  She is having a better day today she will take a few bites of applesauce for me.   The only word she can speak is "Yes".   She is unable to lift her head off the pillow.  I spoke to her son.  He is very concerned and wants the best possible care for her.  He would like to continue to pursue Hospice facility placement for her.  MOST form was completed and she is comfort measures only.  He does not want to put her thru any trips back to the hospital.  No further IV fluids.  Assessment: Patient with heart failure and end stage dementia. Was continually moaning loudly two days ago.  Now more come.  Today eating bites and sips   Needs to continue with round the clock doses of haldol/morphine q 4 hours.   Patient Profile/HPI:  87 y.o.femalewith past medical history of atrial fibrillation, AVR, s/p complete heart block s/p pace maker placement, osteoporosis, and dementiawho was admitted on 3/8/2022with SOB after a fall a couple of days before. She had been eating very poorly. Her [name] tells me that she became cyanotic at [facility] so they made the call to send her to the ER.  Length of Stay: 6   Vital Signs: BP 114/65 (BP Location: Right Arm)   Pulse 70   Temp 100 F (37.8 C)   Resp  15   Ht 5\' 3"  (1.6 m)   Wt 68.7 kg   SpO2 100%   BMI 26.83 kg/m  SpO2: SpO2: 100 % O2 Device: O2 Device: Room Air O2 Flow Rate: O2 Flow Rate (L/min): 2 L/min       Palliative Assessment/Data: 20%     Palliative Care Plan    Recommendations/Plan:  Continue comfort measures only.  Will request TOC  Refer to [facility] for a bed.   Continue scheduled IV medications.  Code Status:  DNR  Prognosis:  Less than 2 weeks patient has advanced heart failure and end stage dementia.  Very poor PO intake.  Comfort measures only.  Discharge Planning:  Hospice facility  Care plan was discussed with [name], communicated with Dr. Maylene Roes  Thank you for allowing the Palliative Medicine Team to assist in the care of this patient.  Total time spent:  35 min.     Greater than 50%  of this time was spent counseling and coordinating care related to the above assessment and plan.  Florentina Jenny, PA-C Palliative Medicine  Please contact Palliative MedicineTeam phone at [phone] for questions and concerns between 7 am - 7 pm.   Please see AMION for individual provider pager numbers.

## 2020-06-03 NOTE — Progress Notes (Addendum)
PROGRESS NOTE    [name]  ZWC:[id number] DOB: [birth year] DOA: 05/28/2020 PCP: Binnie Rail, MD     Brief Narrative:  [name] a 87 y.o.femalewith medical history significant ofadvanced dementia, CKD stage III,paroxysmalA. fib not on anticoagulation, CAD status post CABG, carotid artery stenosis, complete heart block status post PPM 2013, hypertension, hyperlipidemia, thoracic aortic aneurysm status post repair 2013, bioprosthetic AVR in 2003presenting to the ED via EMS for evaluation of altered mental status. She was satting 88% on room air with EMS, placed on 2 L supplemental oxygen and sats improved to 96%.No history could be obtained from the patient given her advanced dementia. Daughter-in-law at bedside states patient resides at the memory care unit at [facility]. States she has advanced dementia and is disoriented at baseline. She usually responds to questions only by saying yes or no. She is normally able to ambulate with a walker but today staff at her nursing facility noticed that she was very lethargic and not able to walk. They noticed that she was having difficulty breathing, appeared pale, and her lips were purple.  Patient was admitted with working diagnosis of acute hypoxemic respiratory failure.  VTE was ruled out.  Patient had elevated troponin and cardiology was consulted.  Patient was treated with IV Lasix, IV heparin for heart failure.  Descending aorta aneurysm remained stable.  Due to patient's continued restlessness, agitation and overall poor prognosis, palliative care medicine was consulted.  Family decided to transition patient to comfort care and hospice.  New events last 24 hours / Subjective: Awake in bed, answers yes to every question, slow to answer, does not seem in distress, full breakfast tray at bedside has not been touched  Assessment & Plan:   Principal Problem:   Acute hypoxemic respiratory failure (HCC) Active  Problems:   Dementia without behavioral disturbance (HCC)   Acute metabolic encephalopathy   Generalized weakness   Acute-on-chronic kidney injury ([place])   Fall   Shortness of breath   Thoracic aortic aneurysm without rupture ([place])   Acute combined systolic and diastolic heart failure (HCC)   Palliative care encounter    Acute hypoxemic respiratory failure Acute systolic and diastolic heart failure Elevated troponin Descending aorta aneurysm Leukocytosis -sepsis ruled out AKI on CKD stage IIIb Paroxysmal atrial fibrillation Complete heart block Advanced dementia Generalized weakness and falls Hypokalemia   Comfort care.  Appreciate palliative care medicine.  Patient seems appropriate for residential hospice, <2 weeks prognosis.    Code Status: DNR Disposition Plan:  Status is: Inpatient  Remains inpatient appropriate because:Unsafe d/c plan   Dispo: The patient is from: Memory care unit              Anticipated d/c is to: Hospice     Consultants:   Cardiology  Palliative care medicine   Antimicrobials:  Anti-infectives (From admission, onward)   Start     Dose/Rate Route Frequency Ordered Stop   05/28/20 2315  cefTRIAXone (ROCEPHIN) 1 g in sodium chloride 0.9 % 100 mL IVPB        1 g 200 mL/hr over 30 Minutes Intravenous  Once 05/28/20 2307 05/29/20 0010       Objective: Vitals:   05/31/20 1304 05/31/20 2215 06/01/20 0618 06/02/20 1614  BP: 137/83 115/76 140/63 114/65  Pulse: 79 86 74 70  Resp: 15 16 20 15   Temp: (!) 96.3 F (35.7 C) 98.1 F (36.7 C) 98.6 F (37 C) 100 F (37.8 C)  TempSrc:  Oral Oral   SpO2: 97% 96% 97% 100%  Weight:      Height:       No intake or output data in the 24 hours ending 06/03/20 1019 Filed Weights   05/29/20 0530 05/30/20 0600 05/31/20 0500  Weight: 73.5 kg 69.5 kg 68.7 kg    Examination: General exam: Appears calm and comfortable  Respiratory system: Clear to auscultation. Respiratory effort  normal. Cardiovascular system: S1 & S2 heard, RRR. +click.  Central nervous system: Alert  Extremities: Symmetric in appearance bilaterally     Data Reviewed: I have personally reviewed following labs and imaging studies  CBC: Recent Labs  Lab 05/28/20 1826 05/29/20 0239 05/29/20 0245 05/30/20 0653 05/31/20 0553  WBC 16.9*  --  12.2* 10.7* 8.3  HGB 12.7 12.2 11.8* 11.5* 12.7  HCT 40.2 36.0 37.6 35.7* 40.8  MCV 88.4  --  88.9 86.4 88.7  PLT 225  --  196 190 417   Basic Metabolic Panel: Recent Labs  Lab 05/28/20 1826 05/29/20 0239 05/29/20 0245 05/30/20 0653 05/31/20 0553  NA 137 138 138 136 139  K 4.3 4.0 3.9 3.4* 4.5  CL 101  --  102 101 104  CO2 18*  --  21* 24 23  GLUCOSE 165*  --  149* 100* 96  BUN 29*  --  31* 32* 30*  CREATININE 2.06*  --  1.85* 1.67* 1.69*  CALCIUM 9.5  --  9.0 8.6* 8.8*   GFR: Estimated Creatinine Clearance: 18.9 mL/min (A) (by C-G formula based on SCr of 1.69 mg/dL (H)). Liver Function Tests: Recent Labs  Lab 05/28/20 2125  AST 31  ALT 17  ALKPHOS 88  BILITOT 1.2  PROT 7.1  ALBUMIN 3.2*   No results for input(s): LIPASE, AMYLASE in the last 168 hours. Recent Labs  Lab 05/29/20 0245  AMMONIA 15   Coagulation Profile: No results for input(s): INR, PROTIME in the last 168 hours. Cardiac Enzymes: No results for input(s): CKTOTAL, CKMB, CKMBINDEX, TROPONINI in the last 168 hours. BNP (last 3 results) No results for input(s): PROBNP in the last 8760 hours. HbA1C: No results for input(s): HGBA1C in the last 72 hours. CBG: Recent Labs  Lab 05/28/20 2003  GLUCAP 164*   Lipid Profile: No results for input(s): CHOL, HDL, LDLCALC, TRIG, CHOLHDL, LDLDIRECT in the last 72 hours. Thyroid Function Tests: No results for input(s): TSH, T4TOTAL, FREET4, T3FREE, THYROIDAB in the last 72 hours. Anemia Panel: No results for input(s): VITAMINB12, FOLATE, FERRITIN, TIBC, IRON, RETICCTPCT in the last 72 hours. Sepsis Labs: Recent Labs   Lab 05/28/20 2125 05/29/20 0245  PROCALCITON  --  1.14  LATICACIDVEN 1.9 1.3    Recent Results (from the past 240 hour(s))  Culture, blood (routine x 2)     Status: None   Collection Time: 05/28/20  9:03 PM   Specimen: BLOOD  Result Value Ref Range Status   Specimen Description BLOOD SITE NOT SPECIFIED  Final   Special Requests   Final    BOTTLES DRAWN AEROBIC AND ANAEROBIC Blood Culture adequate volume   Culture   Final    NO GROWTH 5 DAYS Performed at [facility], [name], [place], [place] [address]    Report Status 06/02/2020 FINAL  Final  Culture, blood (routine x 2)     Status: None   Collection Time: 05/28/20  9:14 PM   Specimen: BLOOD  Result Value Ref Range Status   Specimen Description BLOOD SITE NOT SPECIFIED  Final   Special Requests   Final    BOTTLES DRAWN AEROBIC AND ANAEROBIC Blood Culture adequate volume   Culture   Final    NO GROWTH 5 DAYS Performed at [facility], [address], [place], [place] [address]    Report Status 06/02/2020 FINAL  Final  Resp Panel by RT-PCR (Flu A&B, Covid) Nasopharyngeal Swab     Status: None   Collection Time: 05/28/20 11:35 PM   Specimen: Nasopharyngeal Swab; Nasopharyngeal(NP) swabs in vial transport medium  Result Value Ref Range Status   SARS Coronavirus 2 by RT PCR NEGATIVE NEGATIVE Final    Comment: (NOTE) SARS-CoV-2 target nucleic acids are NOT DETECTED.  The SARS-CoV-2 RNA is generally detectable in upper respiratory specimens during the acute phase of infection. The lowest concentration of SARS-CoV-2 viral copies this assay can detect is 138 copies/mL. A negative result does not preclude SARS-Cov-2 infection and should not be used as the sole basis for treatment or other patient management decisions. A negative result may occur with  improper specimen collection/handling, submission of specimen other than nasopharyngeal swab, presence of viral mutation(s) within the areas targeted by this  assay, and inadequate number of viral copies(<138 copies/mL). A negative result must be combined with clinical observations, patient history, and epidemiological information. The expected result is Negative.  Fact Sheet for Patients:  EntrepreneurPulse.com.au  Fact Sheet for Healthcare Providers:  IncredibleEmployment.be  This test is no t yet approved or cleared by the [place] FDA and  has been authorized for detection and/or diagnosis of SARS-CoV-2 by FDA under an Emergency Use Authorization (EUA). This EUA will remain  in effect (meaning this test can be used) for the duration of the COVID-19 declaration under Section 564(b)(1) of the Act, 21 U.S.C.section 360bbb-3(b)(1), unless the authorization is terminated  or revoked sooner.       Influenza A by PCR NEGATIVE NEGATIVE Final   Influenza B by PCR NEGATIVE NEGATIVE Final    Comment: (NOTE) The Xpert Xpress SARS-CoV-2/FLU/RSV plus assay is intended as an aid in the diagnosis of influenza from Nasopharyngeal swab specimens and should not be used as a sole basis for treatment. Nasal washings and aspirates are unacceptable for Xpert Xpress SARS-CoV-2/FLU/RSV testing.  Fact Sheet for Patients: EntrepreneurPulse.com.au  Fact Sheet for Healthcare Providers: IncredibleEmployment.be  This test is not yet approved or cleared by the [place] FDA and has been authorized for detection and/or diagnosis of SARS-CoV-2 by FDA under an Emergency Use Authorization (EUA). This EUA will remain in effect (meaning this test can be used) for the duration of the COVID-19 declaration under Section 564(b)(1) of the Act, 21 U.S.C. section 360bbb-3(b)(1), unless the authorization is terminated or revoked.  Performed at [facility], [place] [address], [place], [place] [address]   Urine culture     Status: None   Collection Time: 05/29/20  4:48 AM   Specimen:  Urine, Catheterized  Result Value Ref Range Status   Specimen Description URINE, CATHETERIZED  Final   Special Requests NONE  Final   Culture   Final    NO GROWTH Performed at [facility] [address], [place], [place] [address]    Report Status 05/30/2020 FINAL  Final      Radiology Studies: No results found.    Scheduled Meds: . haloperidol lactate  1 mg Intravenous Q8H  .  morphine injection  2 mg Intravenous Q8H  . OLANZapine zydis  5 mg Oral Daily  .  sodium chloride flush  3 mL Intravenous Q12H   Continuous Infusions: . sodium chloride       LOS: 6 days      Time spent: 15 minutes   Dessa Phi, DO [facility] 06/03/2020, 10:19 AM   Available via Epic secure chat 7am-7pm After these hours, please refer to coverage provider listed on [email or web address]

## 2020-06-03 NOTE — Progress Notes (Signed)
Call report to [facility] at [facility] [phone]. Family at bedside ask that they be given a call when transport pick up pt.

## 2020-06-03 NOTE — TOC Transition Note (Signed)
Transition of Care [facility]) - CM/SW Discharge Note   Patient Details  Name: [name] MRN: [id number] Date of Birth: [birth year]  [facility] CM/SW Contact:  Joanne Chars, LCSW Phone Number: 06/03/2020, 3:37 PM   Clinical Narrative:  Pt discharging to hospice of [place] residential facility.  RN call report to [phone].       Final next level of care: [place] Barriers to Discharge: Barriers Resolved   Patient Goals and CMS Choice   CMS Medicare.gov Compare Post Acute Care list provided to:: Patient Represenative (must comment) Choice offered to / list presented to : Adult Children  Discharge Placement              Patient chooses bed at:  [facility] [place]) Patient to be transferred to facility by: [place] Name of family member notified: [name] Patient and family notified of of transfer: 06/03/20  Discharge Plan and Services In-house Referral: Clinical Social Work   Post Acute Care Choice: Hospice                               Social Determinants of Health (SDOH) Interventions     Readmission Risk Interventions No flowsheet data found.

## 2020-06-03 NOTE — Progress Notes (Signed)
   Received referral from Transitions of care team Greg. To review and see if they would be appropriate for our in patient facility. Spoke to the son Keesha Pellum. He is in agreement with hospice care and the goals are in line with a comfort care approach. Spoke to my MD and she does approve the pt for [facility]osp[facility] in [place]. Pt [name] agrees to this and has signed paperwork for pt to transfer to our facility. Thank you the opportunity to serve our community. Webb Silversmith RN [phone]

## 2020-06-03 NOTE — Discharge Summary (Signed)
Physician Discharge Summary  [name] GXQ:[id number] DOB: 27-Aug-1923 DOA: 05/28/2020  PCP: Binnie Rail, MD  Admit date: 05/28/2020 Discharge date: 06/03/2020  Admitted From: Memory care unit Disposition:  Hospice   CODE STATUS: DNR   Diet recommendation: Comfort   Brief/Interim Summary: [name] a 87 y.o.femalewith medical history significant ofadvanced dementia, CKD stage III,paroxysmalA. fib not on anticoagulation, CAD status post CABG, carotid artery stenosis, complete heart block status post PPM 2013, hypertension, hyperlipidemia, thoracic aortic aneurysm status post repair 2013, bioprosthetic AVR in 2003presenting to the ED via EMS for evaluation of altered mental status.She was satting 88% on room air with EMS, placed on 2 L supplemental oxygen and sats improved to 96%.No history could be obtained from the patient given her advanced dementia. Daughter-in-law at bedside states patient resides at the memory care unit at [facility]. States she has advanced dementia and is disoriented at baseline. She usually responds to questions only by saying yes or no. She is normally able to ambulate with a walker but today staff at her nursing facility noticed that she was very lethargic and not able to walk. They noticed that she was having difficulty breathing, appeared pale, and her lips were purple. Patient was admitted with working diagnosis of acute hypoxemic respiratory failure.  VTE was ruled out.  Patient had elevated troponin and cardiology was consulted.  Patient was treated with IV Lasix, IV heparin for heart failure.  Descending aorta aneurysm remained stable.  Due to patient's continued restlessness, agitation and overall poor prognosis, palliative care medicine was consulted.  Family decided to transition patient to comfort care and hospice.  Discharge Diagnoses:  Principal Problem:   Acute hypoxemic respiratory failure (HCC) Active Problems:    Dementia without behavioral disturbance (HCC)   Paroxysmal atrial fibrillation (HCC)   Pacemaker-St.Jude   CKD (chronic kidney disease) stage 3, GFR 30-59 ml/min (HCC)   Acute metabolic encephalopathy   Generalized weakness   Acute-on-chronic kidney injury ([place])   Fall   Shortness of breath   Thoracic aortic aneurysm without rupture (HCC)   Acute combined systolic and diastolic heart failure (HCC)   Comfort measures only status   Elevated troponin   Leukocytosis   DNR (do not resuscitate)   Discharge Instructions   Allergies as of 06/03/2020      Reactions   Coumadin [warfarin Sodium] Other (See Comments)   hallucinations    Clarithromycin Nausea Only, Other (See Comments)   Iohexol     Desc: sob with ivp approx 20 yrs ago, pt was premedicated with 13hr prep with no problems kdean, Onset Date: [id number]   Nsaids Nausea Only   Penicillins Other (See Comments)   Abdominal pain   Prednisone Nausea Only, Swelling   Statins Other (See Comments)   REACTION: abd pain and dark stools   Sulfonamide Derivatives Nausea Only, Other (See Comments)   Abdominal pain      Medication List    STOP taking these medications   acetaminophen 325 MG tablet Commonly known as: TYLENOL   furosemide 40 MG tablet Commonly known as: LASIX   polyethylene glycol 17 g packet Commonly known as: MIRALAX / GLYCOLAX   pravastatin 40 MG tablet Commonly known as: PRAVACHOL   risperiDONE 0.25 MG tablet Commonly known as: RISPERDAL       Allergies  Allergen Reactions  . Coumadin [Warfarin Sodium] Other (See Comments)    hallucinations   . Clarithromycin Nausea Only and Other (See Comments)  . Iohexol  Desc: sob with ivp approx 20 yrs ago, pt was premedicated with 13hr prep with no problems kdean, Onset Date: [id number]   . Nsaids Nausea Only  . Penicillins Other (See Comments)    Abdominal pain  . Prednisone Nausea Only and Swelling  . Statins Other (See Comments)    REACTION: abd pain  and dark stools  . Sulfonamide Derivatives Nausea Only and Other (See Comments)    Abdominal pain      Procedures/Studies: DG Chest 2 View  Result Date: 05/28/2020 CLINICAL DATA:  Hypoxia EXAM: CHEST - 2 VIEW COMPARISON:  06/14/2019 FINDINGS: Unchanged markedly widened upper mediastinum consistent with known thoracic aortic aneurysm. Unchanged position of left chest wall pacemaker leads. Remote median sternotomy. There is pulmonary vascular congestion without overt edema. No pleural effusion or pneumothorax. No focal airspace consolidation. IMPRESSION: 1. Pulmonary vascular congestion without overt edema. 2. Unchanged appearance of known thoracic aortic aneurysm. Electronically Signed   By: Ulyses Jarred M.D.   On: 05/28/2020 19:17   CT HEAD WO CONTRAST  Result Date: 05/28/2020 CLINICAL DATA:  Dementia, tremors, difficulty standing EXAM: CT HEAD WITHOUT CONTRAST TECHNIQUE: Contiguous axial images were obtained from the base of the skull through the vertex without intravenous contrast. COMPARISON:  12/26/2019 FINDINGS: Brain: Stable chronic small-vessel ischemic changes are seen throughout the periventricular white matter and bilateral basal ganglia. No signs of acute infarct or hemorrhage. Lateral ventricles and midline structures are stable. No acute extra-axial fluid collections. No mass effect. Vascular: No hyperdense vessel or unexpected calcification. Skull: Normal. Negative for fracture or focal lesion. Sinuses/Orbits: No acute finding. Other: None. IMPRESSION: 1. Stable chronic small vessel ischemic changes. No acute intracranial process. Electronically Signed   By: Randa Ngo M.D.   On: 05/28/2020 20:25   CT Cervical Spine Wo Contrast  Result Date: 05/28/2020 CLINICAL DATA:  Neck trauma, weakness EXAM: CT CERVICAL SPINE WITHOUT CONTRAST TECHNIQUE: Multidetector CT imaging of the cervical spine was performed without intravenous contrast. Multiplanar CT image reconstructions were also  generated. COMPARISON:  12/27/2019 FINDINGS: Alignment: Stable mild anterolisthesis of C3 on C4 and C4 on C5. Otherwise alignment is anatomic. Skull base and vertebrae: No acute fracture. No primary bone lesion or focal pathologic process. Soft tissues and spinal canal: No prevertebral fluid or swelling. No visible canal hematoma. Disc levels: Mild diffuse facet hypertrophic changes throughout the cervical spine. Prominent cervical spondylosis at C5-6, with mild symmetrical neural foraminal encroachment. Upper chest: Airway is patent. Lung apices are clear. Partial visualization of a known thoracic aortic aneurysm. Other: Reconstructed images demonstrate no additional findings. IMPRESSION: 1. No acute cervical spine fracture. 2. Stable multilevel cervical facet hypertrophy and spondylosis, most pronounced at C5-6. 3. Stable partial visualization of a known thoracic aortic aneurysm. Electronically Signed   By: Randa Ngo M.D.   On: 05/28/2020 19:51   CT Hip Right Wo Contrast  Result Date: 05/29/2020 CLINICAL DATA:  Right hip pain EXAM: CT OF THE RIGHT HIP WITHOUT CONTRAST TECHNIQUE: Multidetector CT imaging of the right hip was performed according to the standard protocol. Multiplanar CT image reconstructions were also generated. COMPARISON:  None. FINDINGS: Bones/Joint/Cartilage Left total hip arthroplasty has been performed. The pelvis and right hip are intact. No acute fracture or dislocation. There is severe right hip degenerative arthritis with complete loss of the joint space, remodeling of the femoral head and acetabulum, extensive osteophyte formation, and subchondral sclerosis and cyst formation. The osseous structures are diffusely osteopenic. Degenerative changes are noted within the visualized lumbar spine.  Ligaments Suboptimally assessed by CT. Muscles and Tendons There is asymmetric atrophy and fatty infiltration of the right gluteal and tensor fascia lata musculature. The ileo psoas musculature  is markedly atrophic bilaterally though the iliopsoas tendon appears intact. Gluteal tendon and hamstring tendons appear intact. Soft tissues The visualized bowel is unremarkable. No free fluid within the pelvis. Bladder is unremarkable. Uterus absent. No adnexal masses. Soft tissues of the body wall are unremarkable. IMPRESSION: No acute fracture or dislocation of the right hip. Severe right hip degenerative arthritis with acetabular and femoral head remodeling. Electronically Signed   By: Fidela Salisbury MD   On: 05/29/2020 00:29   NM Pulmonary Perf and Vent  Result Date: 05/29/2020 CLINICAL DATA:  Shortness of breath/hypoxia EXAM: NUCLEAR MEDICINE PERFUSION LUNG SCAN TECHNIQUE: Perfusion images were obtained in multiple projections after intravenous injection of radiopharmaceutical. Views: Anterior, posterior, left lateral, right lateral, RPO, LPO, RAO, LAO RADIOPHARMACEUTICALS:  4.1 mCi Tc-41m MAA IV COMPARISON:  Chest radiograph May 28, 2020 FINDINGS: Radiotracer uptake is homogeneous and symmetric bilaterally. No perfusion defects are evident. Marked prominence of the thoracic aorta is noted. IMPRESSION: No appreciable perfusion defects. No findings indicative of pulmonary embolus. Dilatation of the thoracic aorta noted on this study, correlating with findings on chest radiograph. Electronically Signed   By: Lowella Grip III M.D.   On: 05/29/2020 13:36   DG CHEST PORT 1 VIEW  Result Date: 05/29/2020 CLINICAL DATA:  Weakness and shortness of breath EXAM: PORTABLE CHEST 1 VIEW COMPARISON:  05/28/2020 FINDINGS: Cardiac shadow remains enlarged. Pacing device and postsurgical changes are again seen. Tortuous thoracic aorta with aneurysmal dilatation is again noted similar to that seen on prior exams. No focal infiltrate or sizable effusion is seen. No bony abnormality is noted. IMPRESSION: Chronic dilatation of the ascending aorta. No other focal abnormality is noted. Electronically Signed   By: Inez Catalina M.D.   On: 05/29/2020 19:18   ECHOCARDIOGRAM COMPLETE  Result Date: 05/29/2020    ECHOCARDIOGRAM REPORT   Patient Name:   [name] Date of Exam: 05/29/2020 Medical Rec #:  [id number]   Height:       63.0 in Accession #:    [id number]  Weight:       162.0 lb Date of Birth:  12-19-1923   BSA:          1.768 m Patient Age:    28 years    BP:           130/71 mmHg Patient Gender: F           HR:           79 bpm. Exam Location:  Inpatient Procedure: 2D Echo Indications:    CHF-Acute Diastolic W29.56  History:        Patient has prior history of Echocardiogram examinations, most                 recent 06/30/2012. CAD, Prior CABG; Risk Factors:Hypertension and                 Dyslipidemia.                 Aortic Valve: unknown valve is present in the aortic position.                 Procedure Date: 2003.  Sonographer:    Mikki Santee RDCS (AE) Referring Phys: [id number] [place]  1. Left ventricular ejection fraction, by estimation, is 45 to 50%.  The left ventricle has mildly decreased function. The left ventricle demonstrates global hypokinesis. Left ventricular diastolic parameters are indeterminate.  2. Right ventricular systolic function is normal. The right ventricular size is normal. There is normal pulmonary artery systolic pressure.  3. The mitral valve is normal in structure. No evidence of mitral valve regurgitation.  4. The aortic valve has been repaired/replaced. Aortic valve regurgitation is not visualized. There is a unknown valve present in the aortic position. Procedure Date: 2003. Comparison(s): A prior study was performed on 06/28/2012. Stable valve parameters; decreased LVEF. FINDINGS  Left Ventricle: Left ventricular ejection fraction, by estimation, is 45 to 50%. The left ventricle has mildly decreased function. The left ventricle demonstrates global hypokinesis. The left ventricular internal cavity size was normal in size. There is  no left ventricular hypertrophy.  Abnormal (paradoxical) septal motion consistent with post-operative status. Left ventricular diastolic parameters are indeterminate. Right Ventricle: The right ventricular size is normal. No increase in right ventricular wall thickness. Right ventricular systolic function is normal. There is normal pulmonary artery systolic pressure. The tricuspid regurgitant velocity is 2.01 m/s, and  with an assumed right atrial pressure of 8 mmHg, the estimated right ventricular systolic pressure is 16.1 mmHg. Left Atrium: Left atrial size was normal in size. Right Atrium: Right atrial size was normal in size. Pericardium: There is no evidence of pericardial effusion. Mitral Valve: The mitral valve is normal in structure. No evidence of mitral valve regurgitation. Tricuspid Valve: The tricuspid valve is grossly normal. Tricuspid valve regurgitation is not demonstrated. No evidence of tricuspid stenosis. Aortic Valve: DVI 0.36. The aortic valve has been repaired/replaced. Aortic valve regurgitation is not visualized. Aortic valve mean gradient measures 7.5 mmHg. Aortic valve peak gradient measures 14.4 mmHg. Aortic valve area, by VTI measures 1.14 cm. There is a unknown valve present in the aortic position. Procedure Date: 2003. Pulmonic Valve: The pulmonic valve was not well visualized. Pulmonic valve regurgitation is not visualized. No evidence of pulmonic stenosis. Aorta: The aortic root is normal in size and structure. IAS/Shunts: The atrial septum is grossly normal.  LEFT VENTRICLE PLAX 2D LVIDd:         4.40 cm  Diastology LVIDs:         3.70 cm  LV e' medial:    7.83 cm/s LV PW:         1.00 cm  LV E/e' medial:  11.6 LV IVS:        1.00 cm  LV e' lateral:   11.10 cm/s LVOT diam:     2.00 cm  LV E/e' lateral: 8.2 LV SV:         35 LV SV Index:   20 LVOT Area:     3.14 cm  RIGHT VENTRICLE TAPSE (M-mode): 1.0 cm LEFT ATRIUM             Index       RIGHT ATRIUM           Index LA diam:        4.10 cm 2.32 cm/m  RA Area:      11.50 cm LA Vol (A2C):   36.1 ml 20.42 ml/m RA Volume:   24.60 ml  13.91 ml/m LA Vol (A4C):   40.1 ml 22.68 ml/m LA Biplane Vol: 41.0 ml 23.19 ml/m  AORTIC VALVE AV Area (Vmax):    1.10 cm AV Area (Vmean):   1.07 cm AV Area (VTI):     1.14 cm AV Vmax:  190.00 cm/s AV Vmean:          124.500 cm/s AV VTI:            0.312 m AV Peak Grad:      14.4 mmHg AV Mean Grad:      7.5 mmHg LVOT Vmax:         66.80 cm/s LVOT Vmean:        42.400 cm/s LVOT VTI:          0.113 m LVOT/AV VTI ratio: 0.36  AORTA Ao Root diam: 2.10 cm MITRAL VALVE                TRICUSPID VALVE MV Area (PHT): 3.54 cm     TR Peak grad:   16.2 mmHg MV Decel Time: 214 msec     TR Vmax:        201.00 cm/s MV E velocity: 90.70 cm/s MV A velocity: 114.00 cm/s  SHUNTS MV E/A ratio:  0.80         Systemic VTI:  0.11 m                             Systemic Diam: 2.00 cm Rudean Haskell MD Electronically signed by Rudean Haskell MD Signature Date/Time: 05/29/2020/12:47:47 PM    Final    DG HIP UNILAT WITH PELVIS 2-3 VIEWS LEFT  Result Date: 05/28/2020 CLINICAL DATA:  Difficulty standing. EXAM: DG HIP (WITH OR WITHOUT PELVIS) 2-3V LEFT COMPARISON:  Radiograph 12/11/2019 FINDINGS: Left hip arthroplasty in expected alignment. No periprosthetic lucency or fracture. Bones are diffusely under mineralized. The pubic rami are intact. Right hip better assessed on concurrent right hip exam, reported separately. There are vascular calcifications. IMPRESSION: Left hip arthroplasty in expected alignment without complication. Electronically Signed   By: Keith Rake M.D.   On: 05/28/2020 20:46   DG HIP UNILAT WITH PELVIS 2-3 VIEWS RIGHT  Result Date: 05/28/2020 CLINICAL DATA:  Weakness and difficulty standing. EXAM: DG HIP (WITH OR WITHOUT PELVIS) 2-3V RIGHT COMPARISON:  12/10/2019 FINDINGS: Advanced right hip degenerative change. Complete right hip joint space loss with flattening and remodeling of the femoral head, subchondral cystic  change in bony fragmentation. Findings are chronic and not significantly changed. No acute fracture. Pubic rami are intact. IMPRESSION: Advanced right hip degenerative change without acute abnormality. Electronically Signed   By: Keith Rake M.D.   On: 05/28/2020 20:47   VAS [place] LOWER EXTREMITY VENOUS (DVT)  Result Date: 05/29/2020  Lower Venous DVT Study Indications: Swelling. Other Indications: D-Dimer. Risk Factors: None identified. Limitations: Patient positioning. unable to position legs appropriately for exam. Comparison Study: No previous Performing Technologist: Vonzell Schlatter RVT  Examination Guidelines: A complete evaluation includes B-mode imaging, spectral Doppler, color Doppler, and power Doppler as needed of all accessible portions of each vessel. Bilateral testing is considered an integral part of a complete examination. Limited examinations for reoccurring indications may be performed as noted. The reflux portion of the exam is performed with the patient in reverse Trendelenburg.  +---------+---------------+---------+-----------+----------+--------------+ RIGHT    CompressibilityPhasicitySpontaneityPropertiesThrombus Aging +---------+---------------+---------+-----------+----------+--------------+ CFV      Full           Yes      Yes                                 +---------+---------------+---------+-----------+----------+--------------+ SFJ      Full                                                        +---------+---------------+---------+-----------+----------+--------------+  FV Prox  Full                                                        +---------+---------------+---------+-----------+----------+--------------+ FV Mid   Full                                                        +---------+---------------+---------+-----------+----------+--------------+ FV DistalFull                                                         +---------+---------------+---------+-----------+----------+--------------+ PFV      Full                                                        +---------+---------------+---------+-----------+----------+--------------+ POP      Full           Yes      Yes                                 +---------+---------------+---------+-----------+----------+--------------+ PTV      Full                                                        +---------+---------------+---------+-----------+----------+--------------+ PERO     Full                                                        +---------+---------------+---------+-----------+----------+--------------+   +---------+---------------+---------+-----------+----------+--------------+ LEFT     CompressibilityPhasicitySpontaneityPropertiesThrombus Aging +---------+---------------+---------+-----------+----------+--------------+ CFV      Full           Yes      Yes                                 +---------+---------------+---------+-----------+----------+--------------+ SFJ      Full                                                        +---------+---------------+---------+-----------+----------+--------------+ FV Prox  Full                                                        +---------+---------------+---------+-----------+----------+--------------+  FV Mid   Full                                                        +---------+---------------+---------+-----------+----------+--------------+ FV DistalFull                                                        +---------+---------------+---------+-----------+----------+--------------+ PFV      Full                                                        +---------+---------------+---------+-----------+----------+--------------+ POP      Full           Yes      Yes                                  +---------+---------------+---------+-----------+----------+--------------+ PTV      Full                                                        +---------+---------------+---------+-----------+----------+--------------+ PERO     Full                                                        +---------+---------------+---------+-----------+----------+--------------+  Summary: BILATERAL: - No evidence of deep vein thrombosis seen in the lower extremities, bilaterally. -No evidence of popliteal cyst, bilaterally.   *See table(s) above for measurements and observations. Electronically signed by Harold Barban MD on 05/29/2020 at 10:08:59 PM.    Final        Discharge Exam: Vitals:   06/01/20 0618 06/02/20 1614  BP: 140/63 114/65  Pulse: 74 70  Resp: 20 15  Temp: 98.6 F (37 C) 100 F (37.8 C)  SpO2: 97% 100%     General exam: Appears calm and comfortable  Respiratory system: Clear to auscultation. Respiratory effort normal. Cardiovascular system: S1 & S2 heard, RRR. +click.  Central nervous system: Alert  Extremities: Symmetric in appearance bilaterally     The results of significant diagnostics from this hospitalization (including imaging, microbiology, ancillary and laboratory) are listed below for reference.     Microbiology: Recent Results (from the past 240 hour(s))  Culture, blood (routine x 2)     Status: None   Collection Time: 05/28/20  9:03 PM   Specimen: BLOOD  Result Value Ref Range Status   Specimen Description BLOOD SITE NOT SPECIFIED  Final   Special Requests   Final    BOTTLES DRAWN AEROBIC AND ANAEROBIC Blood Culture adequate volume   Culture   Final    NO GROWTH 5 DAYS Performed at  [facility], [place] [address], [place], [place] [address]    Report Status 06/02/2020 FINAL  Final  Culture, blood (routine x 2)     Status: None   Collection Time: 05/28/20  9:14 PM   Specimen: BLOOD  Result Value Ref Range Status   Specimen Description BLOOD  SITE NOT SPECIFIED  Final   Special Requests   Final    BOTTLES DRAWN AEROBIC AND ANAEROBIC Blood Culture adequate volume   Culture   Final    NO GROWTH 5 DAYS Performed at [facility], [place] [address], [place], [place] [address]    Report Status 06/02/2020 FINAL  Final  Resp Panel by RT-PCR (Flu A&B, Covid) Nasopharyngeal Swab     Status: None   Collection Time: 05/28/20 11:35 PM   Specimen: Nasopharyngeal Swab; Nasopharyngeal(NP) swabs in vial transport medium  Result Value Ref Range Status   SARS Coronavirus 2 by RT PCR NEGATIVE NEGATIVE Final    Comment: (NOTE) SARS-CoV-2 target nucleic acids are NOT DETECTED.  The SARS-CoV-2 RNA is generally detectable in upper respiratory specimens during the acute phase of infection. The lowest concentration of SARS-CoV-2 viral copies this assay can detect is 138 copies/mL. A negative result does not preclude SARS-Cov-2 infection and should not be used as the sole basis for treatment or other patient management decisions. A negative result may occur with  improper specimen collection/handling, submission of specimen other than nasopharyngeal swab, presence of viral mutation(s) within the areas targeted by this assay, and inadequate number of viral copies(<138 copies/mL). A negative result must be combined with clinical observations, patient history, and epidemiological information. The expected result is Negative.  Fact Sheet for Patients:  EntrepreneurPulse.com.au  Fact Sheet for Healthcare Providers:  IncredibleEmployment.be  This test is no t yet approved or cleared by the [place] FDA and  has been authorized for detection and/or diagnosis of SARS-CoV-2 by FDA under an Emergency Use Authorization (EUA). This EUA will remain  in effect (meaning this test can be used) for the duration of the COVID-19 declaration under Section 564(b)(1) of the Act, 21 U.S.C.section 360bbb-3(b)(1), unless  the authorization is terminated  or revoked sooner.       Influenza A by PCR NEGATIVE NEGATIVE Final   Influenza B by PCR NEGATIVE NEGATIVE Final    Comment: (NOTE) The Xpert Xpress SARS-CoV-2/FLU/RSV plus assay is intended as an aid in the diagnosis of influenza from Nasopharyngeal swab specimens and should not be used as a sole basis for treatment. Nasal washings and aspirates are unacceptable for Xpert Xpress SARS-CoV-2/FLU/RSV testing.  Fact Sheet for Patients: EntrepreneurPulse.com.au  Fact Sheet for Healthcare Providers: IncredibleEmployment.be  This test is not yet approved or cleared by the [place] FDA and has been authorized for detection and/or diagnosis of SARS-CoV-2 by FDA under an Emergency Use Authorization (EUA). This EUA will remain in effect (meaning this test can be used) for the duration of the COVID-19 declaration under Section 564(b)(1) of the Act, 21 U.S.C. section 360bbb-3(b)(1), unless the authorization is terminated or revoked.  Performed at [facility], [place] [address], [place], [place] [address]   Urine culture     Status: None   Collection Time: 05/29/20  4:48 AM   Specimen: Urine, Catheterized  Result Value Ref Range Status   Specimen Description URINE, CATHETERIZED  Final   Special Requests NONE  Final   Culture   Final    NO GROWTH Performed at [facility],  [address] [place], [place] [address]    Report Status 05/30/2020 FINAL  Final     Labs: BNP (last 3 results) Recent Labs    05/29/20 0245  BNP 962.9*   Basic Metabolic Panel: Recent Labs  Lab 05/28/20 1826 05/29/20 0239 05/29/20 0245 05/30/20 0653 05/31/20 0553  NA 137 138 138 136 139  K 4.3 4.0 3.9 3.4* 4.5  CL 101  --  102 101 104  CO2 18*  --  21* 24 23  GLUCOSE 165*  --  149* 100* 96  BUN 29*  --  31* 32* 30*  CREATININE 2.06*  --  1.85* 1.67* 1.69*  CALCIUM 9.5  --  9.0 8.6* 8.8*   Liver Function  Tests: Recent Labs  Lab 05/28/20 2125  AST 31  ALT 17  ALKPHOS 88  BILITOT 1.2  PROT 7.1  ALBUMIN 3.2*   No results for input(s): LIPASE, AMYLASE in the last 168 hours. Recent Labs  Lab 05/29/20 0245  AMMONIA 15   CBC: Recent Labs  Lab 05/28/20 1826 05/29/20 0239 05/29/20 0245 05/30/20 0653 05/31/20 0553  WBC 16.9*  --  12.2* 10.7* 8.3  HGB 12.7 12.2 11.8* 11.5* 12.7  HCT 40.2 36.0 37.6 35.7* 40.8  MCV 88.4  --  88.9 86.4 88.7  PLT 225  --  196 190 207   Cardiac Enzymes: No results for input(s): CKTOTAL, CKMB, CKMBINDEX, TROPONINI in the last 168 hours. BNP: Invalid input(s): POCBNP CBG: Recent Labs  Lab 05/28/20 2003  GLUCAP 164*   D-Dimer No results for input(s): DDIMER in the last 72 hours. Hgb A1c No results for input(s): HGBA1C in the last 72 hours. Lipid Profile No results for input(s): CHOL, HDL, LDLCALC, TRIG, CHOLHDL, LDLDIRECT in the last 72 hours. Thyroid function studies No results for input(s): TSH, T4TOTAL, T3FREE, THYROIDAB in the last 72 hours.  Invalid input(s): FREET3 Anemia work up No results for input(s): VITAMINB12, FOLATE, FERRITIN, TIBC, IRON, RETICCTPCT in the last 72 hours. Urinalysis    Component Value Date/Time   COLORURINE YELLOW 05/29/2020 0448   APPEARANCEUR HAZY (A) 05/29/2020 0448   LABSPEC 1.020 05/29/2020 0448   PHURINE 5.0 05/29/2020 0448   GLUCOSEU NEGATIVE 05/29/2020 0448   GLUCOSEU NEGATIVE 09/21/2018 1458   HGBUR NEGATIVE 05/29/2020 0448   BILIRUBINUR NEGATIVE 05/29/2020 0448   BILIRUBINUR neg 09/10/2017 1342   KETONESUR 5 (A) 05/29/2020 0448   PROTEINUR NEGATIVE 05/29/2020 0448   UROBILINOGEN 0.2 09/21/2018 1458   NITRITE NEGATIVE 05/29/2020 0448   LEUKOCYTESUR NEGATIVE 05/29/2020 0448   Sepsis Labs Invalid input(s): PROCALCITONIN,  WBC,  LACTICIDVEN Microbiology Recent Results (from the past 240 hour(s))  Culture, blood (routine x 2)     Status: None   Collection Time: 05/28/20  9:03 PM   Specimen:  BLOOD  Result Value Ref Range Status   Specimen Description BLOOD SITE NOT SPECIFIED  Final   Special Requests   Final    BOTTLES DRAWN AEROBIC AND ANAEROBIC Blood Culture adequate volume   Culture   Final    NO GROWTH 5 DAYS Performed at [facility], [address] [place], [place] [address]    Report Status 06/02/2020 FINAL  Final  Culture, blood (routine x 2)     Status: None   Collection Time: 05/28/20  9:14 PM   Specimen: BLOOD  Result Value Ref Range Status   Specimen Description BLOOD SITE NOT SPECIFIED  Final   Special Requests   Final  BOTTLES DRAWN AEROBIC AND ANAEROBIC Blood Culture adequate volume   Culture   Final    NO GROWTH 5 DAYS Performed at [facility], [place] [address] [place], [place] [address]    Report Status 06/02/2020 FINAL  Final  Resp Panel by RT-PCR (Flu A&B, Covid) Nasopharyngeal Swab     Status: None   Collection Time: 05/28/20 11:35 PM   Specimen: Nasopharyngeal Swab; Nasopharyngeal(NP) swabs in vial transport medium  Result Value Ref Range Status   SARS Coronavirus 2 by RT PCR NEGATIVE NEGATIVE Final    Comment: (NOTE) SARS-CoV-2 target nucleic acids are NOT DETECTED.  The SARS-CoV-2 RNA is generally detectable in upper respiratory specimens during the acute phase of infection. The lowest concentration of SARS-CoV-2 viral copies this assay can detect is 138 copies/mL. A negative result does not preclude SARS-Cov-2 infection and should not be used as the sole basis for treatment or other patient management decisions. A negative result may occur with  improper specimen collection/handling, submission of specimen other than nasopharyngeal swab, presence of viral mutation(s) within the areas targeted by this assay, and inadequate number of viral copies(<138 copies/mL). A negative result must be combined with clinical observations, patient history, and epidemiological information. The expected result is Negative.  Fact Sheet for  Patients:  EntrepreneurPulse.com.au  Fact Sheet for Healthcare Providers:  IncredibleEmployment.be  This test is no t yet approved or cleared by the [place] FDA and  has been authorized for detection and/or diagnosis of SARS-CoV-2 by FDA under an Emergency Use Authorization (EUA). This EUA will remain  in effect (meaning this test can be used) for the duration of the COVID-19 declaration under Section 564(b)(1) of the Act, 21 U.S.C.section 360bbb-3(b)(1), unless the authorization is terminated  or revoked sooner.       Influenza A by PCR NEGATIVE NEGATIVE Final   Influenza B by PCR NEGATIVE NEGATIVE Final    Comment: (NOTE) The Xpert Xpress SARS-CoV-2/FLU/RSV plus assay is intended as an aid in the diagnosis of influenza from Nasopharyngeal swab specimens and should not be used as a sole basis for treatment. Nasal washings and aspirates are unacceptable for Xpert Xpress SARS-CoV-2/FLU/RSV testing.  Fact Sheet for Patients: EntrepreneurPulse.com.au  Fact Sheet for Healthcare Providers: IncredibleEmployment.be  This test is not yet approved or cleared by the [place] FDA and has been authorized for detection and/or diagnosis of SARS-CoV-2 by FDA under an Emergency Use Authorization (EUA). This EUA will remain in effect (meaning this test can be used) for the duration of the COVID-19 declaration under Section 564(b)(1) of the Act, 21 U.S.C. section 360bbb-3(b)(1), unless the authorization is terminated or revoked.  Performed at [facility], [place] [address], [place], [place] [address]   Urine culture     Status: None   Collection Time: 05/29/20  4:48 AM   Specimen: Urine, Catheterized  Result Value Ref Range Status   Specimen Description URINE, CATHETERIZED  Final   Special Requests NONE  Final   Culture   Final    NO GROWTH Performed at [facility] [address],  [place], [place] [address]    Report Status 05/30/2020 FINAL  Final     Patient was seen and examined on the day of discharge and was found to be in stable condition. Time coordinating discharge: 35 minutes including assessment and coordination of care, as well as examination of the patient.   SIGNED:  Dessa Phi, DO [facility] 06/03/2020, 3:13 PM

## 2020-06-03 NOTE — Progress Notes (Signed)
Manufacturing engineer [facility])  Patient Ms. [place] was referred to [facility] per [facility] and PMT. At this time patient is not eligible for [facility] place but hospice appropriate.   Visited patient at bedside, she is pleasantly confused. Had taken gown off and was trying to get out of bed. Alert but disoriented.  Easily redirected. Per bedside RN, patient was able to take some bites of food and sips of drink.   [facility] will continue to assess if needed, otherwise will reach out to [facility] about disposition.   Thank you, Clementeen Hoof, BSN, [facility] [phone]

## 2020-06-03 NOTE — TOC Progression Note (Signed)
Transition of Care [facility]) - Progression Note    Patient Details  Name: [name] MRN: [id number] Date of Birth: May 03, 1923  Transition [facility] Care [facility]) CM/SW Contact  Joanne Chars, LCSW Phone Number: 06/03/2020, 1:12 PM  Clinical Narrative:  CSW left message with Cherie from [facility] for residential hospice referral.        Expected Discharge Plan: Home w Hospice Care Barriers to Discharge: Continued Medical Work up  Expected Discharge Plan and Services Expected Discharge Plan: [place] In-house Referral: Clinical Social Work   Post Acute Care Choice: Hospice Living arrangements for the past 2 months: [place] ([place])                                       Social Determinants of Health (SDOH) Interventions    Readmission Risk Interventions No flowsheet data found.

## 2020-06-21 DEATH — deceased

## 2020-07-11 ENCOUNTER — Ambulatory Visit: Payer: Medicare Other | Admitting: Cardiology

## 2022-02-15 IMAGING — CT CT ANGIO CHEST-ABD-PELV FOR DISSECTION W/ AND WO/W CM
2 of 7 series · 13 of 46 positions shown, 15 images · IV contrast (omnipaque)
Comparison: September 06, 2006

CLINICAL DATA: Thoracic aortic aneurysm right upper quadrant pain

EXAM:
CT ANGIOGRAPHY CHEST, ABDOMEN AND PELVIS
TECHNIQUE: Multidetector CT imaging through the chest, abdomen and pelvis was
performed using the standard protocol during bolus administration of
intravenous contrast. Multiplanar reconstructed images and MIPs were
obtained and reviewed to evaluate the vascular anatomy.
CONTRAST:  80mL OMNIPAQUE IOHEXOL 350 MG/ML SOLN

[Series 7: arterial · axial · arterial · 0.93mm/px · z∈[+747,+1307]mm · 10 of 314 slices shown, 12 images]
[im 17/314  soft-tissue]
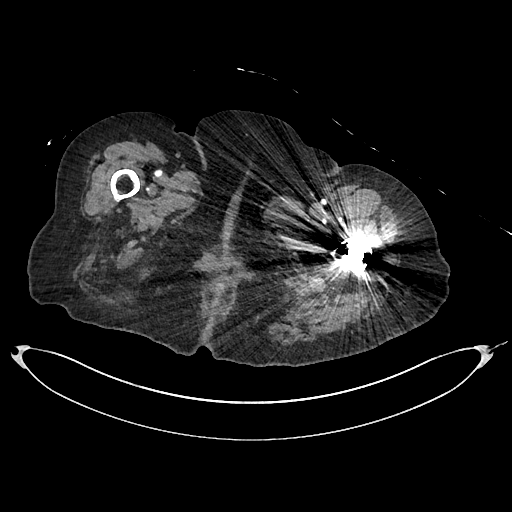
[im 17/314  bone]
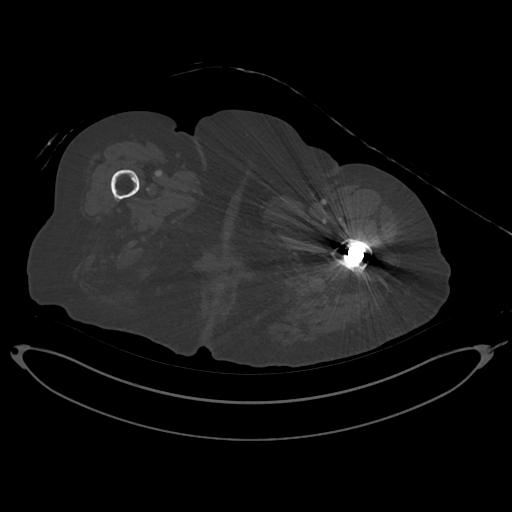
[im 50/314  soft-tissue]
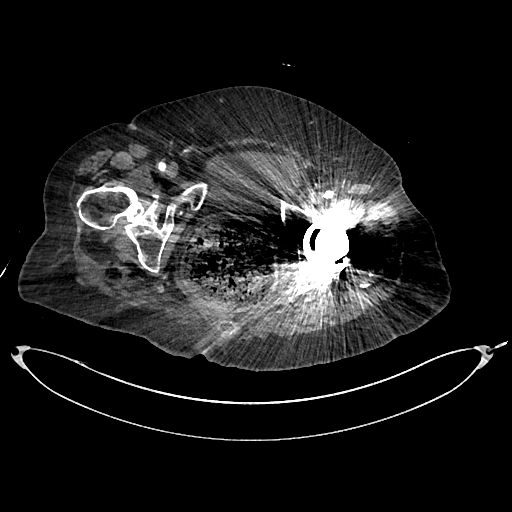
[im 83/314  soft-tissue]
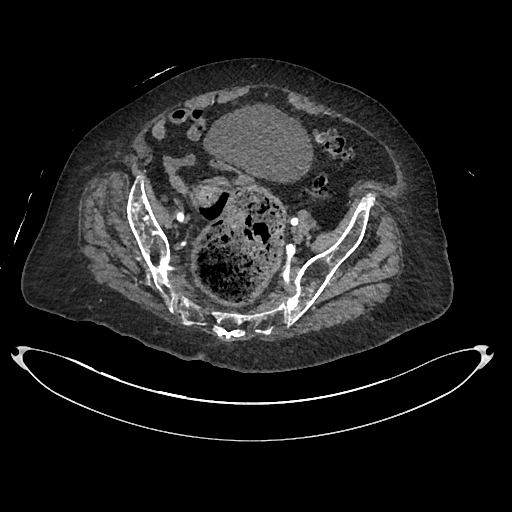
[im 116/314  soft-tissue]
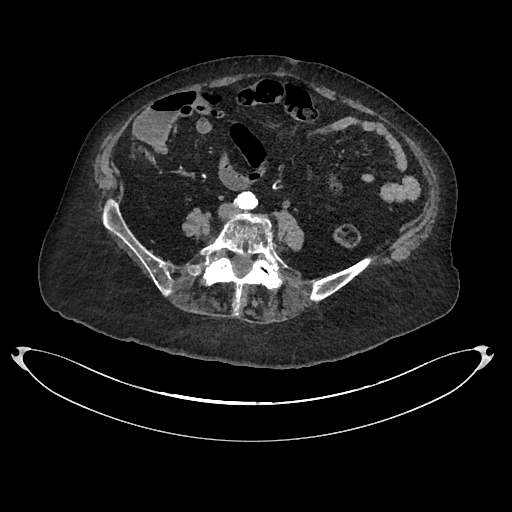
[im 149/314  soft-tissue]
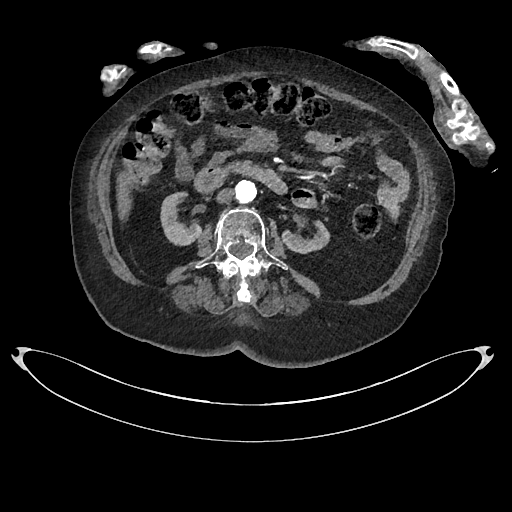
[im 165/314  soft-tissue]
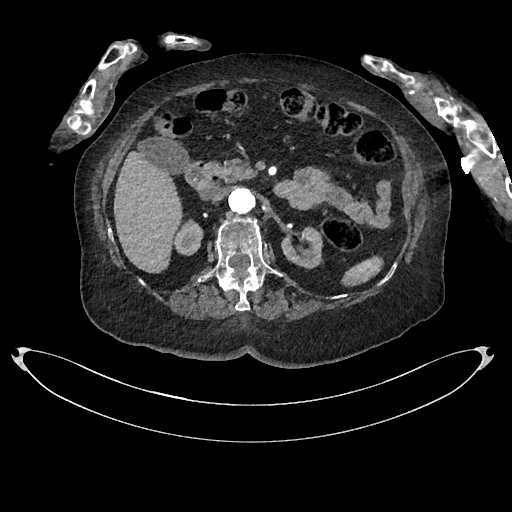
[im 198/314  soft-tissue]
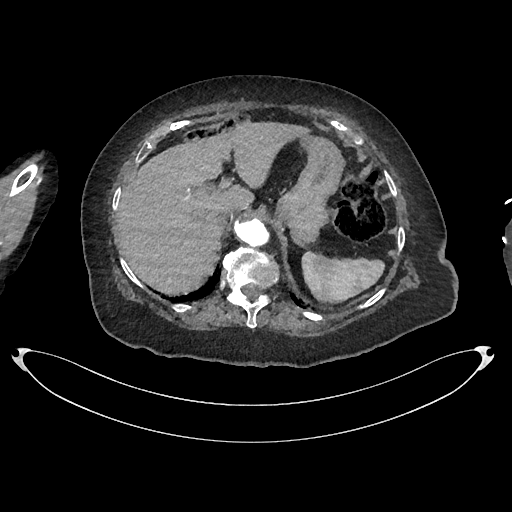
[im 231/314  soft-tissue]
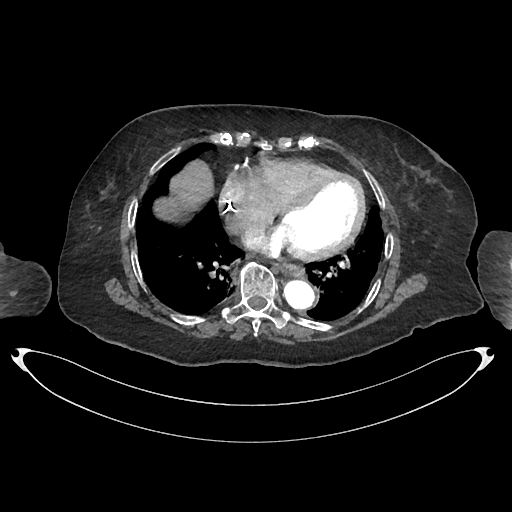
[im 264/314  soft-tissue]
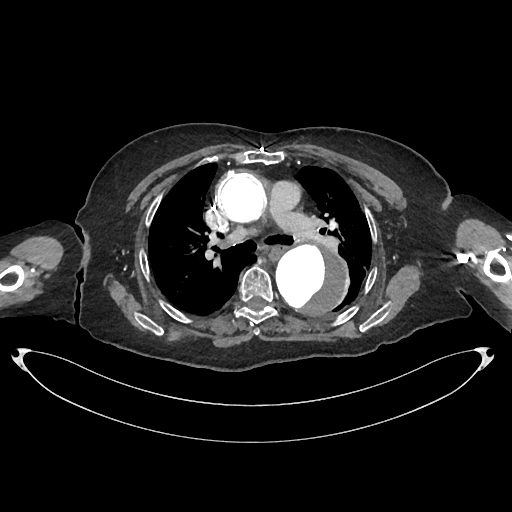
[im 264/314  bone]
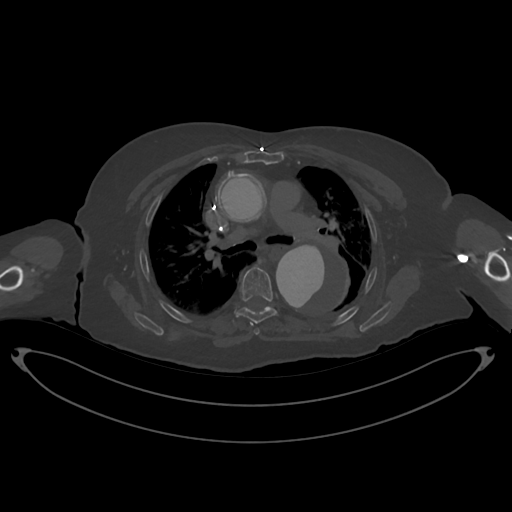
[im 297/314  soft-tissue]
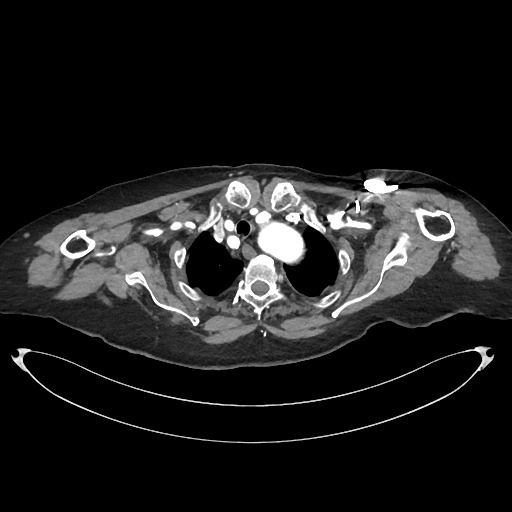

[Series 10: cor · coronal · 0.92mm/px · 3 of 165 slices shown]
[im 42/165  soft-tissue]
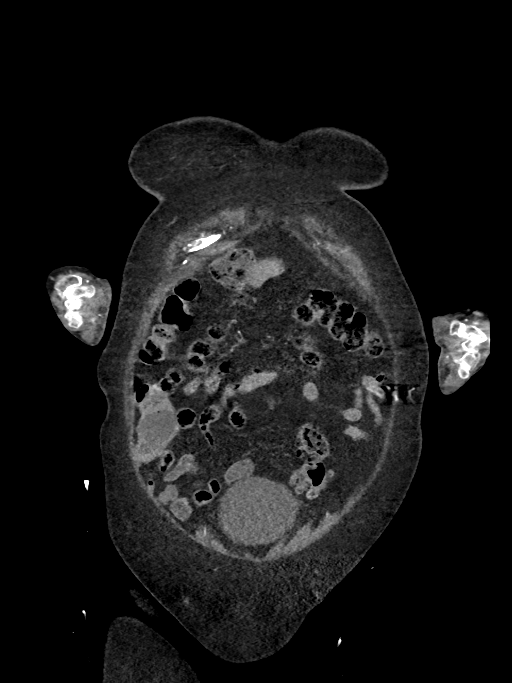
[im 83/165  soft-tissue]
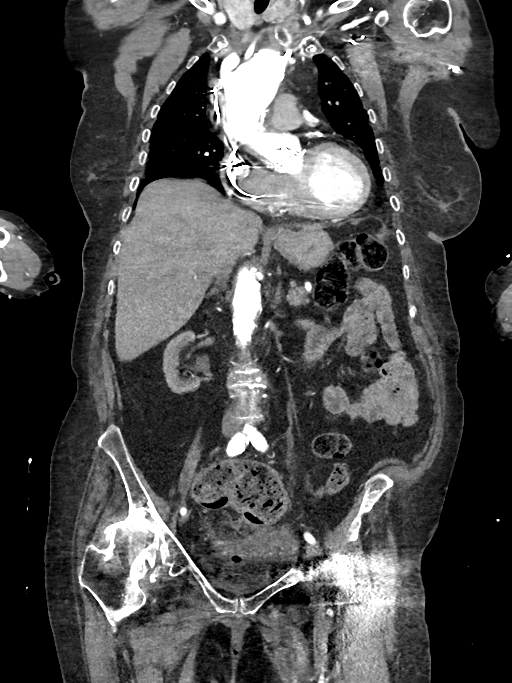
[im 124/165  soft-tissue]
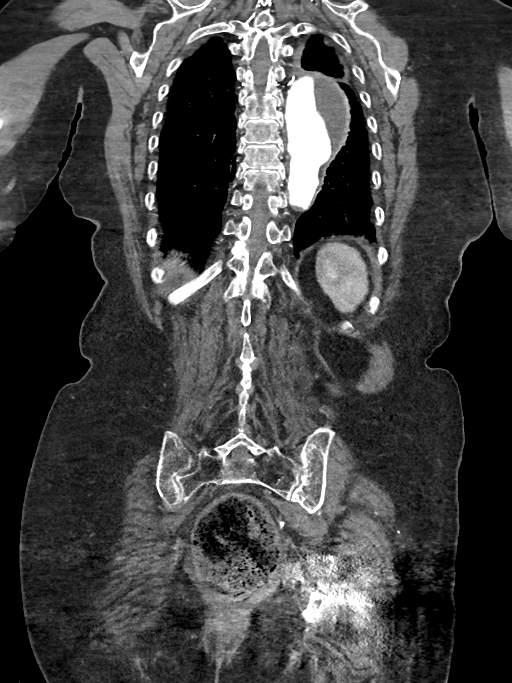

[13 of 46 positions shown; findings below may reference images not displayed]

FINDINGS: CTA CHEST FINDINGS

Cardiovascular:

--Heart: The heart size is mildly enlarged. There is nopericardial
effusion. Aortic valve replacement is seen. A left-sided pacemaker
is present with the lead tips in the right atrium and right
ventricle.

--Aorta: At the level of the aortic arch just posterior to the great
vessels there is an aortic aneurysm measuring up to 4.7 cm in
transverse dimension. Within the proximal descending intrathoracic
aorta the aneurysm measures 6.8 x 6.3 cm with probable chronic
contained leak/thrombosis. No definite dissection flap is seen.
Precontrast images show no aortic intramural hematoma. There is a
conventional 3 vessel aortic arch branching pattern. The proximal
arch vessels are widely patent. Scattered aortic atherosclerosis is
seen throughout.

--Pulmonary Arteries: Contrast timing is optimized for preferential
opacification of the aorta. Within that limitation, normal central
pulmonary arteries.

Mediastinum/Nodes: No mediastinal, hilar or axillary
lymphadenopathy. The visualized thyroid and thoracic esophageal
course are unremarkable.

Lungs/Pleura: Mild bibasilar dependent atelectasis and chronic
interstitial thickening seen at both lung bases. No pleural effusion
or pneumothorax. No focal airspace consolidation. No focal pleural
abnormality.

Musculoskeletal: No chest wall abnormality. No acute osseous
findings.

Review of the MIP images confirms the above findings.

CTA ABDOMEN AND PELVIS FINDINGS

VASCULAR

Aorta: Scattered calcified and noncalcified plaque is seen
throughout the intra-abdominal aorta.

Celiac: There is mild narrowing seen at the origin of the celiac
plexus due to atherosclerosis.

SMA: There is mild narrowing of the origin of the SMA due to
atherosclerosis.

Renals: Single renal arteries bilaterally. No aneurysm, dissection,
stenosis or evidence of fibromuscular dysplasia.

IMA: Patent without abnormality.

Inflow: No aneurysm, stenosis or dissection.Scattered
atherosclerosis is noted throughout.

Veins: Normal course and caliber of the major veins. Assessment is
otherwise limited by the arterial dominant contrast phase.

Review of the MIP images confirms the above findings.

NON-VASCULAR

Hepatobiliary: Normal hepatic contours and density. No visible
biliary dilatation. Normal gallbladder.

Pancreas: Normal contours without ductal dilatation. No
peripancreatic fluid collection.

Spleen: Normal arterial phase splenic enhancement pattern.

Adrenals/Urinary Tract:

--Adrenal glands: Normal.

--Right kidney/ureter: No hydronephrosis or perinephric stranding.
No nephrolithiasis. No obstructing ureteral stones.

--Left kidney/ureter: No hydronephrosis or perinephric stranding. No
nephrolithiasis. No obstructing ureteral stones.

--Urinary bladder: Unremarkable.

Stomach/Bowel:

--Stomach/Duodenum: No hiatal hernia or other gastric abnormality.
Normal duodenal course and caliber.

--Small bowel: No dilatation or inflammation.

--Colon: Large amount of stool seen in the sigmoid colon and rectum
with a rectal fecal ball. Scattered colonic diverticula are noted.

Lymphatic:  No abdominal or pelvic lymphadenopathy.

Reproductive: No free fluid in the pelvis. The patient is status
post hysterectomy.

Musculoskeletal. No bony spinal canal stenosis or focal osseous
abnormality. There is diffuse osteopenia. Chronic superior
compression deformity of the L1 vertebral bodies seen. Advanced
right hip osteoarthritis is noted.

Other: None.

Review of the MIP images confirms the above findings.
IMPRESSION: 1. Descending intrathoracic aortic aneurysm measuring a maximum
diameter of 6.8 x 6.3 cm with contained chronic leak/hematoma. This
is significantly increased in size from the prior exam dating back
to 4995.
2. Prosthetic aortic valve.
3.  Aortic Atherosclerosis ([id number]).
4. Diverticulosis without diverticulitis.
5. Large amount of sigmoid and rectal stool
6. These results were called by telephone at the time of
interpretation on 06/15/2019 at [DATE] to provider Asrir Kakepoto ,
who verbally acknowledged these results.
